# Patient Record
Sex: Female | Born: 1945 | Race: White | Hispanic: No | Smoking: Never smoker
Health system: Southern US, Community
[De-identification: ages and names within clinical notes are randomized; demographics above are authoritative.]

## PROBLEM LIST (undated history)

## (undated) DIAGNOSIS — E785 Hyperlipidemia, unspecified: Secondary | ICD-10-CM

## (undated) DIAGNOSIS — J449 Chronic obstructive pulmonary disease, unspecified: Secondary | ICD-10-CM

## (undated) DIAGNOSIS — L900 Lichen sclerosus et atrophicus: Secondary | ICD-10-CM

## (undated) DIAGNOSIS — Z87448 Personal history of other diseases of urinary system: Secondary | ICD-10-CM

## (undated) DIAGNOSIS — R7303 Prediabetes: Secondary | ICD-10-CM

## (undated) DIAGNOSIS — M810 Age-related osteoporosis without current pathological fracture: Secondary | ICD-10-CM

## (undated) DIAGNOSIS — H409 Unspecified glaucoma: Secondary | ICD-10-CM

## (undated) DIAGNOSIS — Z780 Asymptomatic menopausal state: Secondary | ICD-10-CM

## (undated) DIAGNOSIS — C4490 Unspecified malignant neoplasm of skin, unspecified: Secondary | ICD-10-CM

## (undated) DIAGNOSIS — Z87440 Personal history of urinary (tract) infections: Secondary | ICD-10-CM

## (undated) DIAGNOSIS — I10 Essential (primary) hypertension: Secondary | ICD-10-CM

## (undated) DIAGNOSIS — M8580 Other specified disorders of bone density and structure, unspecified site: Secondary | ICD-10-CM

## (undated) HISTORY — DX: Unspecified glaucoma: H40.9

## (undated) HISTORY — DX: Asymptomatic menopausal state: Z78.0

## (undated) HISTORY — DX: Essential (primary) hypertension: I10

## (undated) HISTORY — DX: Hyperlipidemia, unspecified: E78.5

## (undated) HISTORY — DX: Prediabetes: R73.03

## (undated) HISTORY — DX: Other specified disorders of bone density and structure, unspecified site: M85.80

## (undated) HISTORY — DX: Unspecified malignant neoplasm of skin, unspecified: C44.90

## (undated) HISTORY — DX: Age-related osteoporosis without current pathological fracture: M81.0

## (undated) HISTORY — PX: EYE SURGERY: SHX253

## (undated) HISTORY — DX: Personal history of urinary (tract) infections: Z87.440

## (undated) HISTORY — DX: Lichen sclerosus et atrophicus: L90.0

---
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
## 1999-01-01 ENCOUNTER — Encounter: Payer: Self-pay | Admitting: Emergency Medicine

## 1999-01-01 ENCOUNTER — Emergency Department (HOSPITAL_COMMUNITY): Admission: EM | Admit: 1999-01-01 | Discharge: 1999-01-01 | Payer: Self-pay | Admitting: Emergency Medicine

## 2001-10-07 ENCOUNTER — Encounter: Payer: Self-pay | Admitting: Internal Medicine

## 2001-10-07 ENCOUNTER — Encounter: Admission: RE | Admit: 2001-10-07 | Discharge: 2001-10-07 | Payer: Self-pay | Admitting: Internal Medicine

## 2001-10-30 ENCOUNTER — Encounter: Payer: Self-pay | Admitting: Internal Medicine

## 2001-10-30 ENCOUNTER — Encounter: Admission: RE | Admit: 2001-10-30 | Discharge: 2001-10-30 | Payer: Self-pay | Admitting: Internal Medicine

## 2002-06-19 ENCOUNTER — Other Ambulatory Visit: Admission: RE | Admit: 2002-06-19 | Discharge: 2002-06-19 | Payer: Self-pay | Admitting: Obstetrics and Gynecology

## 2002-07-06 ENCOUNTER — Encounter: Payer: Self-pay | Admitting: Emergency Medicine

## 2002-07-06 ENCOUNTER — Inpatient Hospital Stay (HOSPITAL_COMMUNITY): Admission: EM | Admit: 2002-07-06 | Discharge: 2002-07-08 | Payer: Self-pay | Admitting: Emergency Medicine

## 2002-12-11 ENCOUNTER — Encounter: Payer: Self-pay | Admitting: Internal Medicine

## 2002-12-11 ENCOUNTER — Encounter: Admission: RE | Admit: 2002-12-11 | Discharge: 2002-12-11 | Payer: Self-pay | Admitting: Internal Medicine

## 2003-12-25 ENCOUNTER — Other Ambulatory Visit: Admission: RE | Admit: 2003-12-25 | Discharge: 2003-12-25 | Payer: Self-pay | Admitting: Obstetrics and Gynecology

## 2004-01-07 ENCOUNTER — Encounter: Admission: RE | Admit: 2004-01-07 | Discharge: 2004-01-07 | Payer: Self-pay | Admitting: Internal Medicine

## 2004-08-04 IMAGING — CR DG CHEST 2V
2 series · 2 of 2 positions shown · non-contrast
Comparison: none

CLINICAL DATA: Cough for three weeks. 
 PA AND LATERAL CHEST:
 Portable study done [DATE] is correlated.  The cardiomediastinal contours are stable.  There is a probable small hiatal hernia.  The lungs are clear.  There is no pleural effusion.  Mild thoracic spine degenerative changes are present.

[view not recorded (1 of 2)]
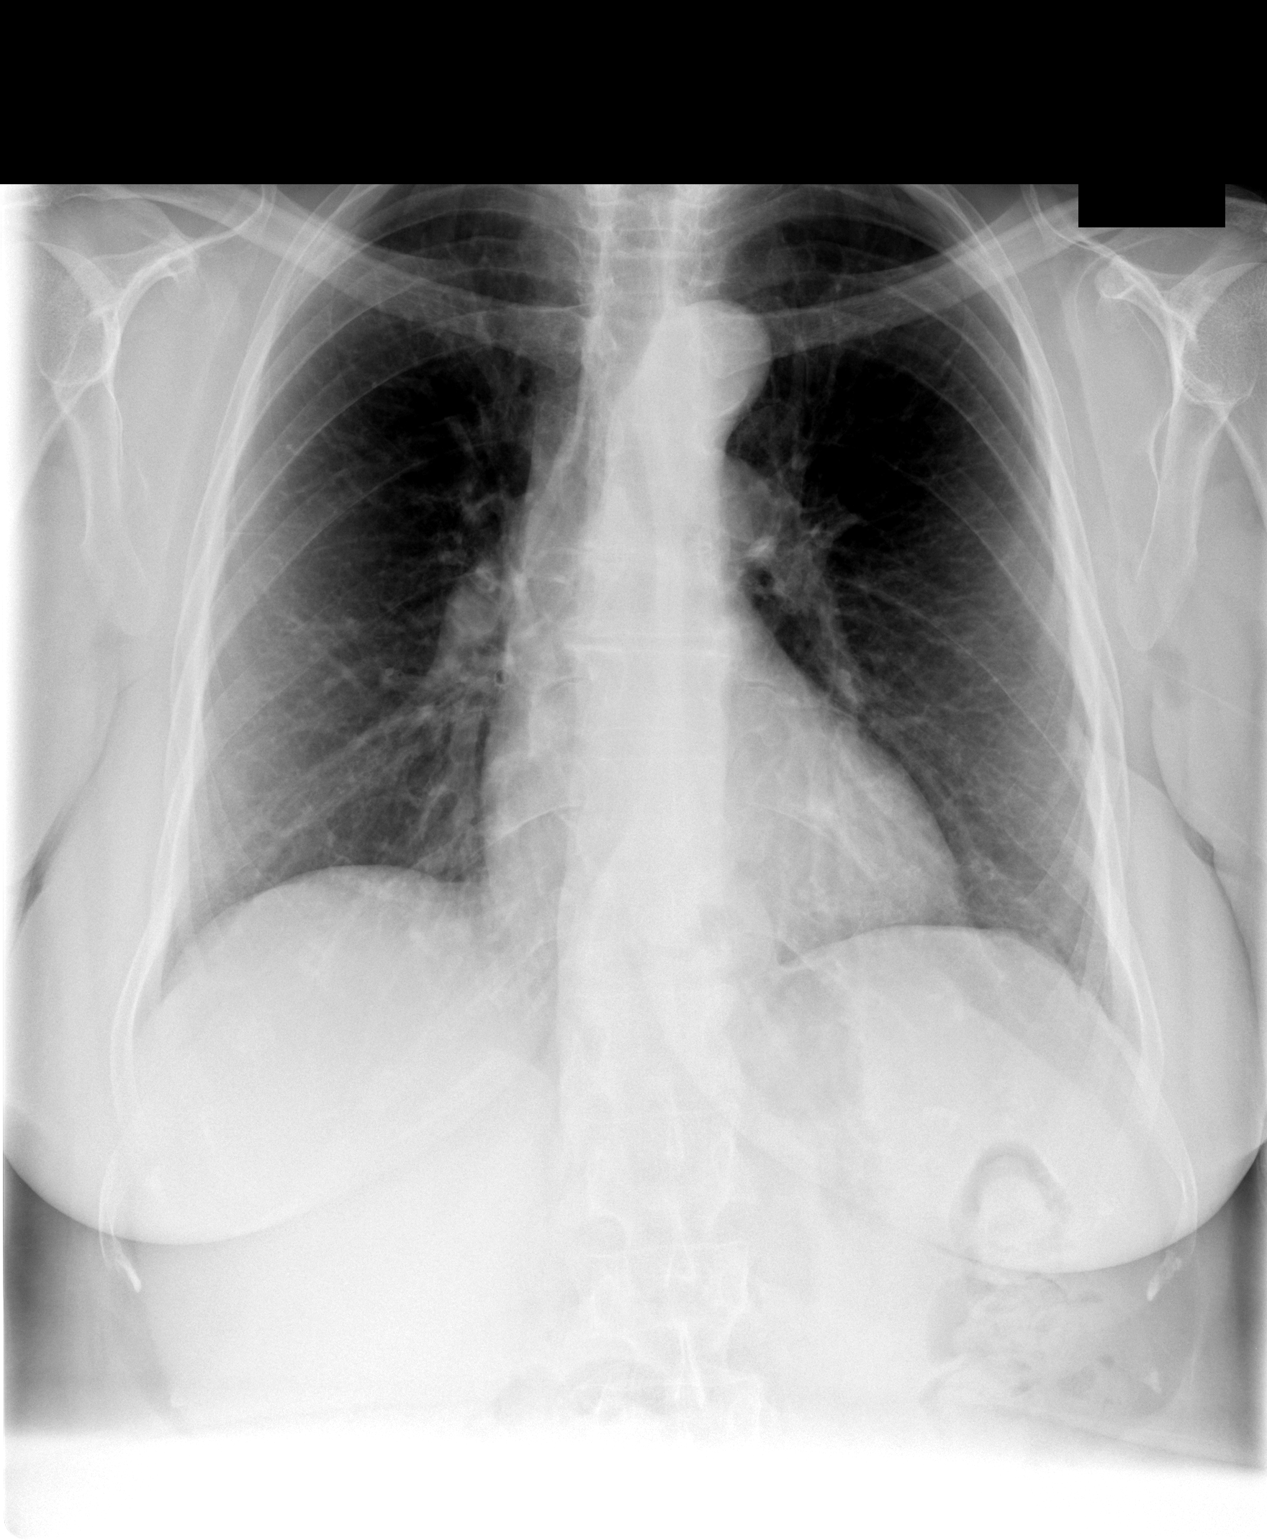

[view not recorded (2 of 2)]
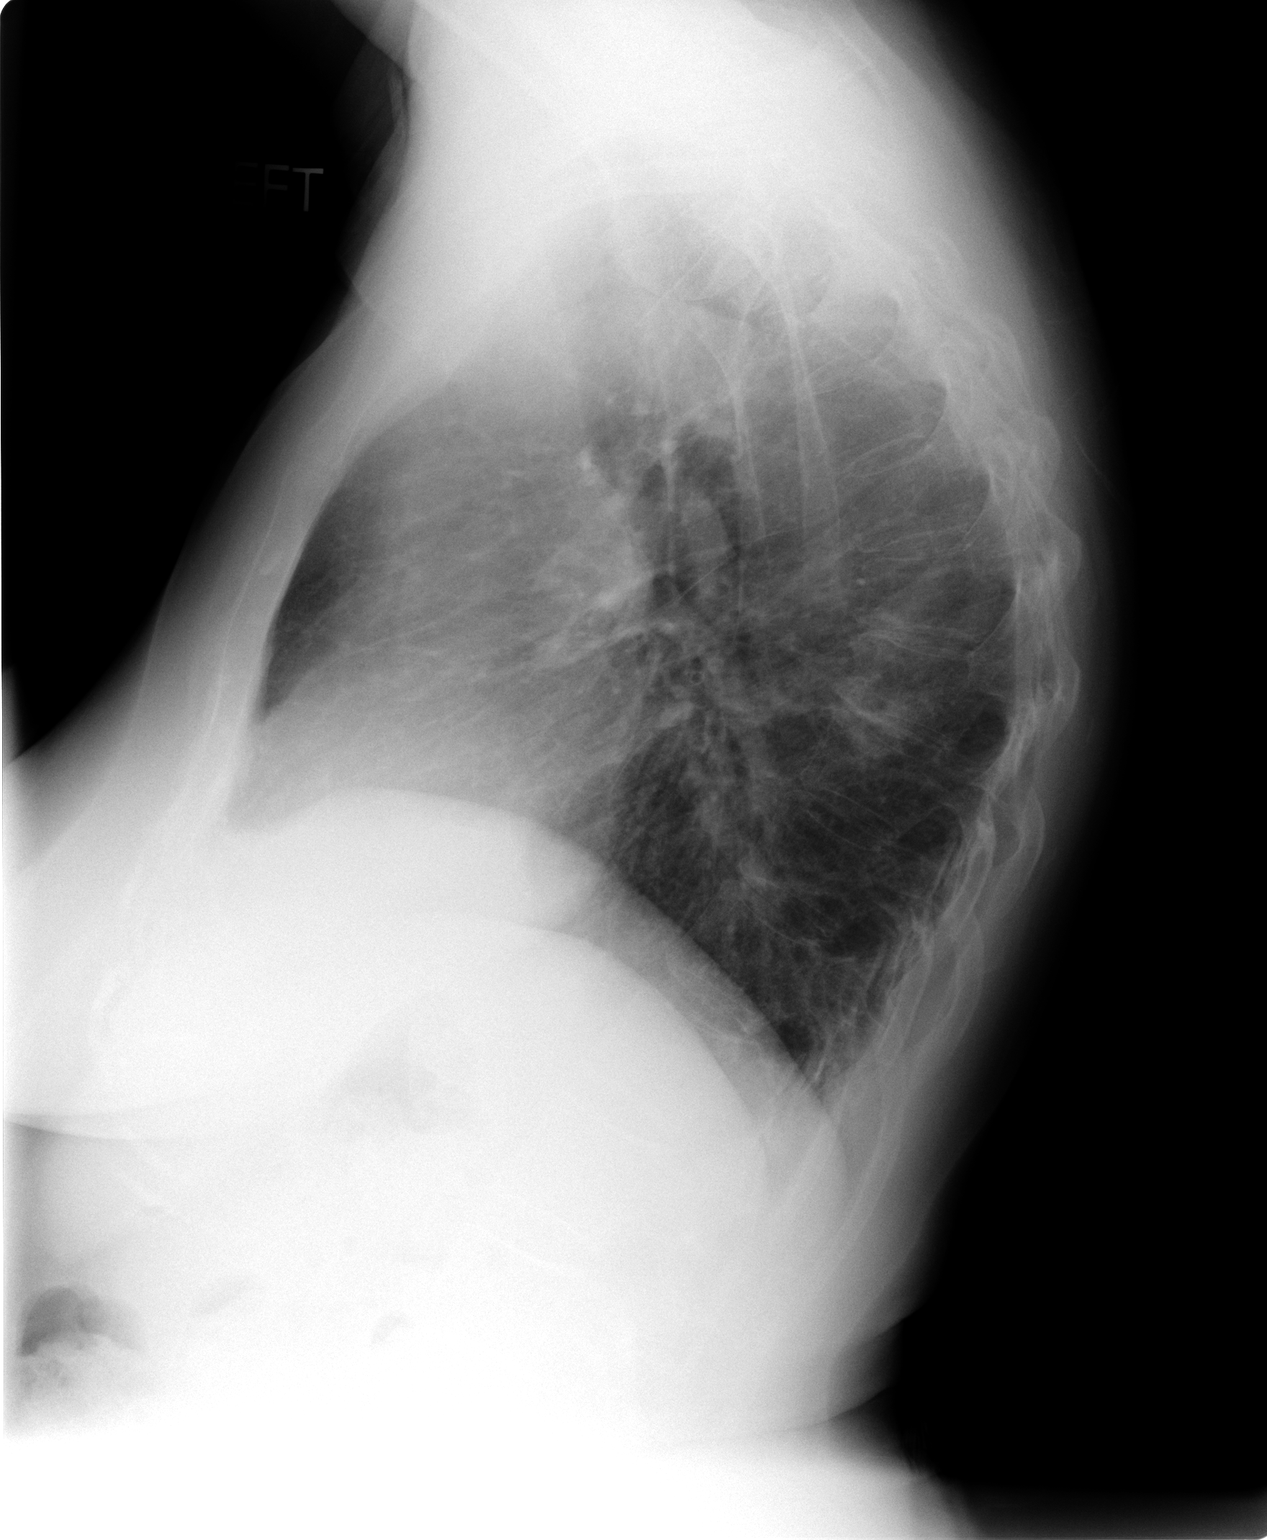

[2 of 2 positions shown; findings below may reference images not displayed]

IMPRESSION: Stable chest.  No active cardiopulmonary process demonstrated. 
 [REDACTED]

## 2004-12-07 ENCOUNTER — Encounter: Admission: RE | Admit: 2004-12-07 | Discharge: 2004-12-07 | Payer: Self-pay | Admitting: Internal Medicine

## 2005-01-17 ENCOUNTER — Encounter: Admission: RE | Admit: 2005-01-17 | Discharge: 2005-01-17 | Payer: Self-pay | Admitting: Internal Medicine

## 2005-01-25 ENCOUNTER — Encounter: Admission: RE | Admit: 2005-01-25 | Discharge: 2005-01-25 | Payer: Self-pay | Admitting: Internal Medicine

## 2005-06-05 ENCOUNTER — Other Ambulatory Visit: Admission: RE | Admit: 2005-06-05 | Discharge: 2005-06-05 | Payer: Self-pay | Admitting: Obstetrics and Gynecology

## 2005-07-05 ENCOUNTER — Encounter: Admission: RE | Admit: 2005-07-05 | Discharge: 2005-07-05 | Payer: Self-pay | Admitting: Internal Medicine

## 2005-07-05 IMAGING — CR DG HIP (WITH OR WITHOUT PELVIS) 2-3V*L*
2 series · 2 of 2 positions shown · non-contrast
Comparison: none

CLINICAL DATA: Pain in the left groin region for approximately three weeks.  No injury. 
 PELVIS ONE VIEW:
 An AP view of the pelvis without previous films for comparison shows a moderate degree of sclerosis associated with the symphysis region bilaterally.  There is no bony destruction or fracture.  The lower lumbar spine shows considerable degenerative hypertrophic facet arthritis at the L5-S1 level bilaterally which could be causing some referred pain into the region of the left groin.

[view not recorded (1 of 2)]
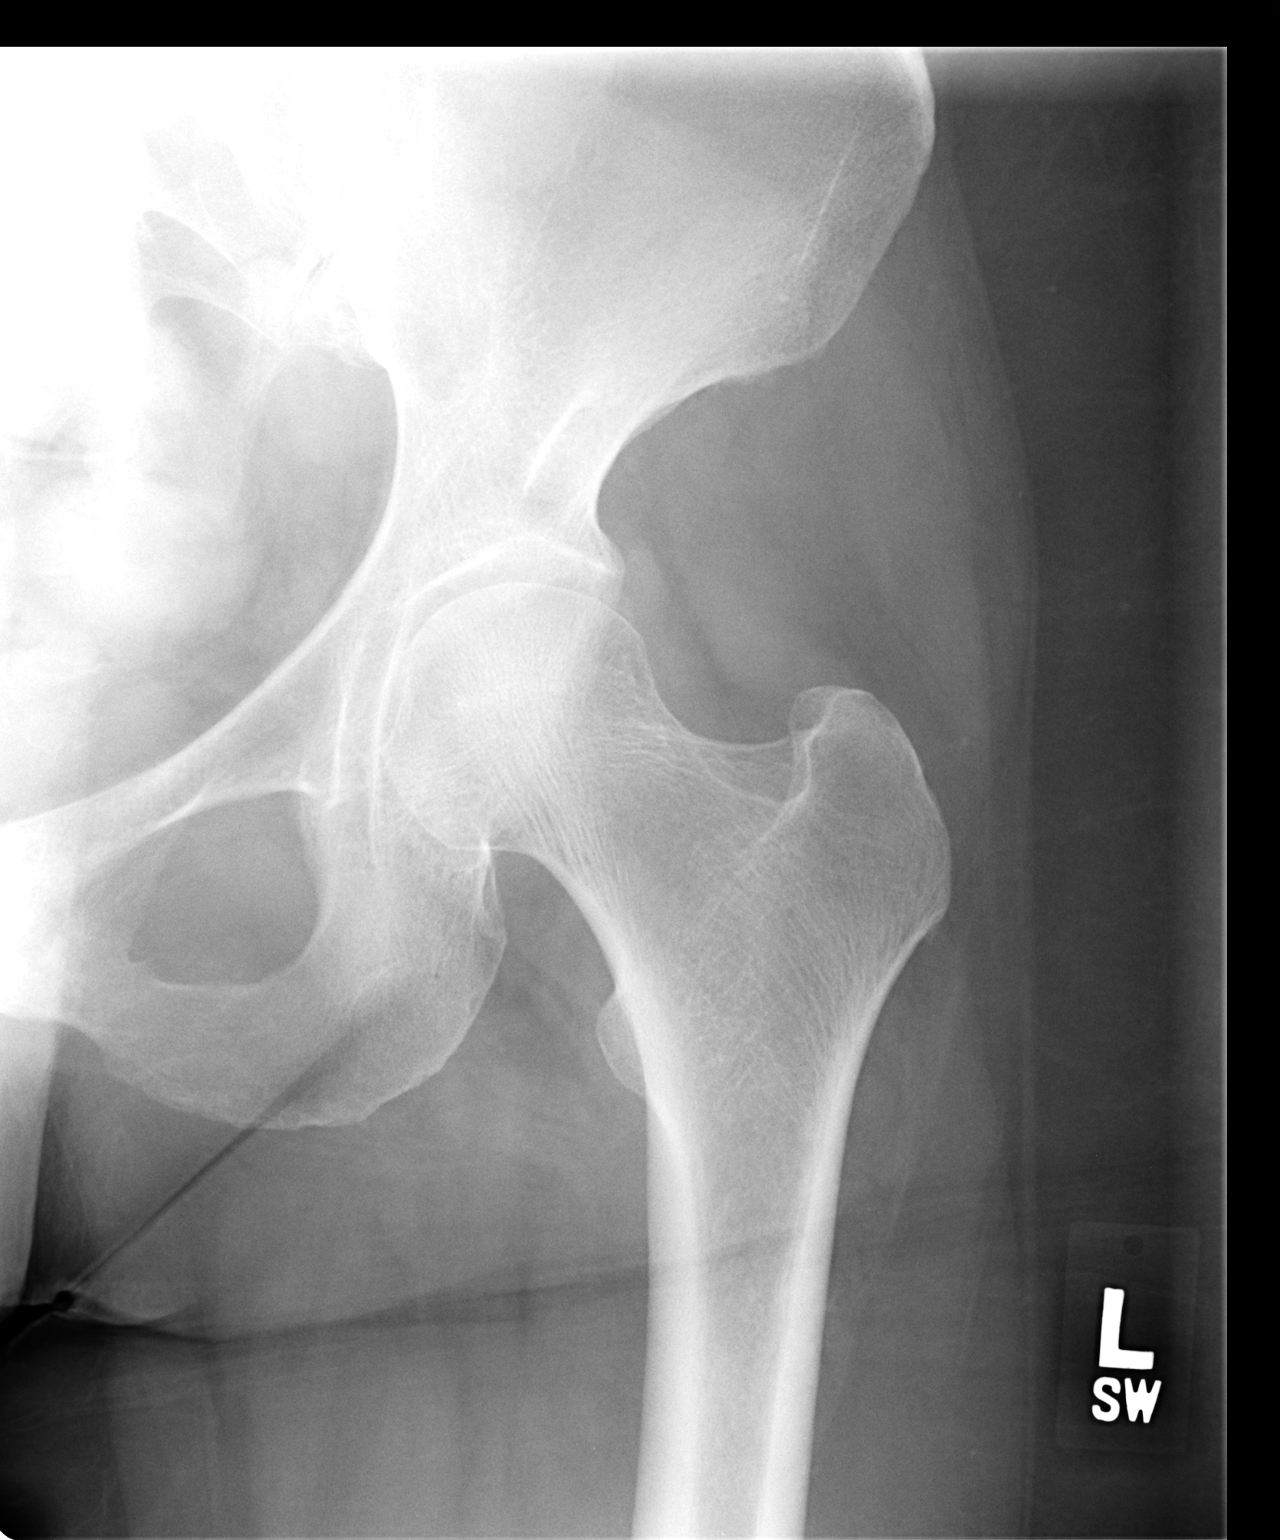

[view not recorded (2 of 2)]
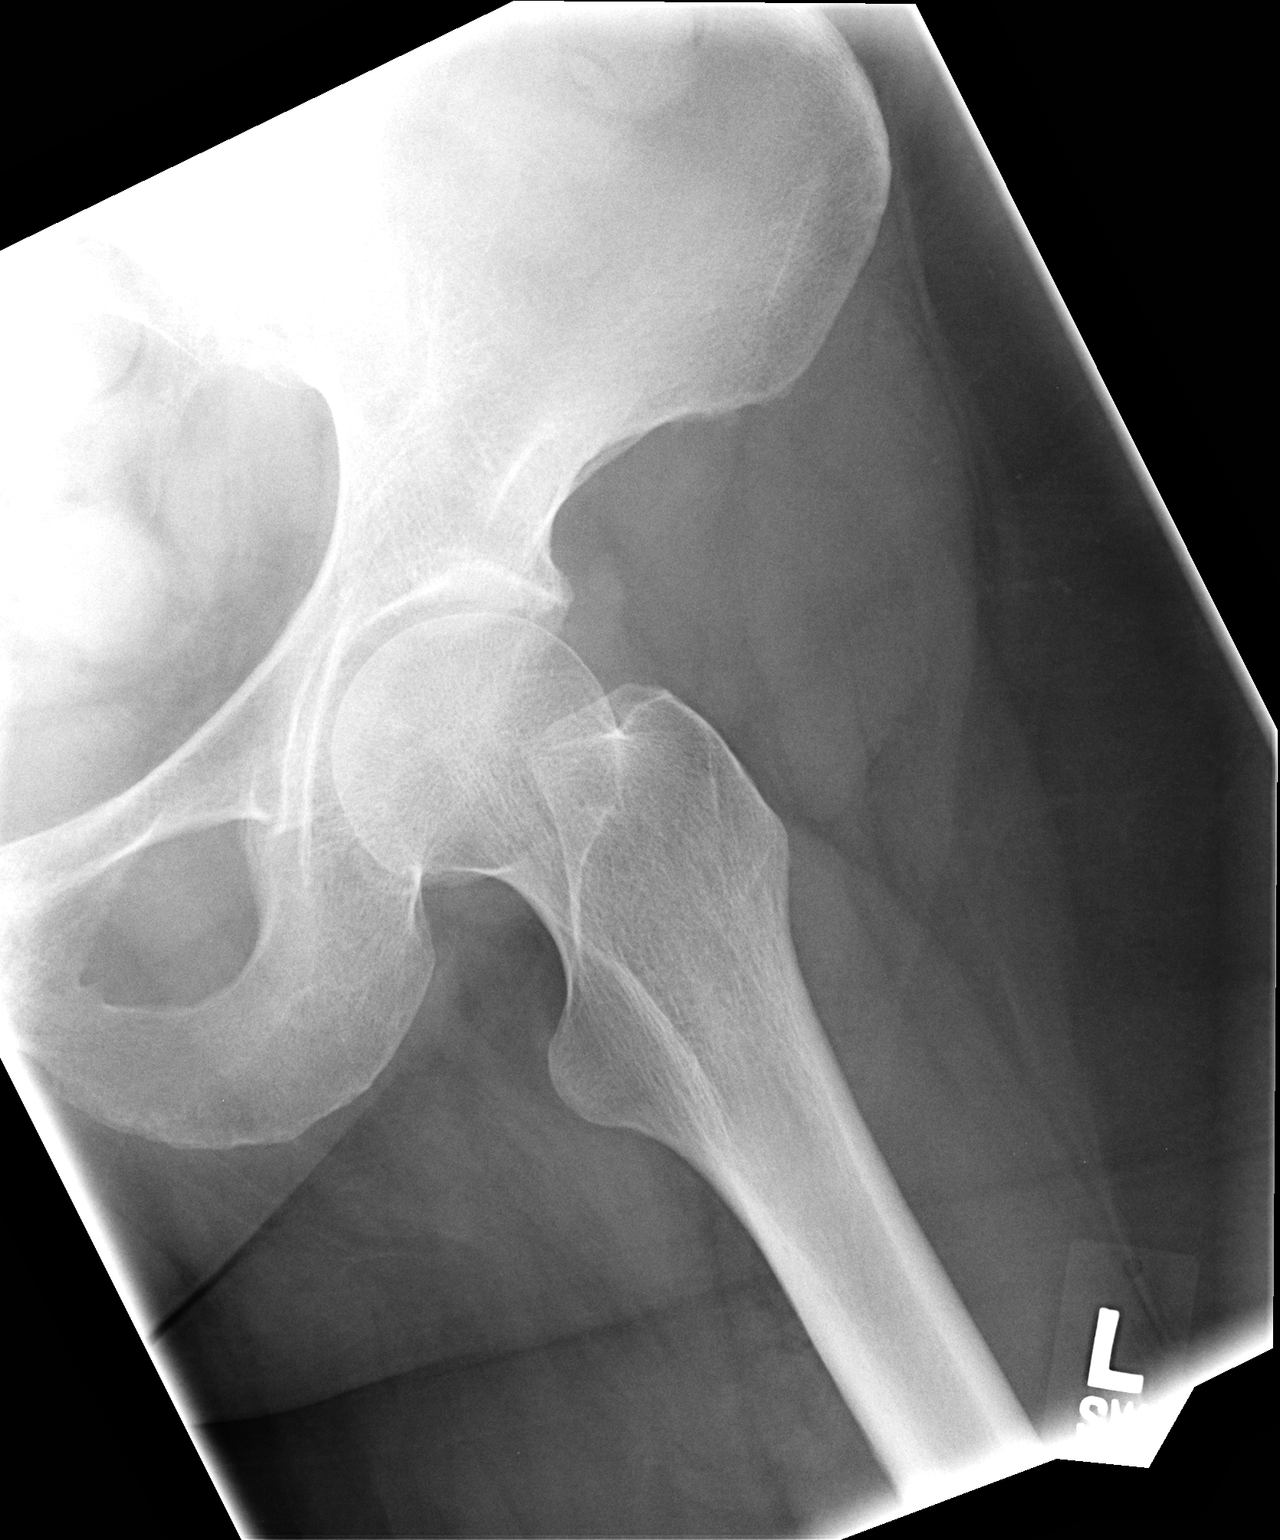

[2 of 2 positions shown; findings below may reference images not displayed]

IMPRESSION: No fracture or foreign body associated with the pelvis.  There are degenerative hypertrophic spurs of the L5-S1 facet joints bilaterally and there is a moderate amount of sclerosis associated with both the right and the left sides of the symphysis. 
 LEFT HIP COMPLETE:
 AP and lateral views of the left hip show no evidence of fracture, dislocation, or radiopaque foreign body.  Soft tissues appear normal.
IMPRESSION: Normal left hip.

## 2005-07-05 IMAGING — CR DG PELVIS 1-2V
1 series · 1 of 1 positions shown · non-contrast
Comparison: none

CLINICAL DATA: Pain in the left groin region for approximately three weeks.  No injury. 
 PELVIS ONE VIEW:
 An AP view of the pelvis without previous films for comparison shows a moderate degree of sclerosis associated with the symphysis region bilaterally.  There is no bony destruction or fracture.  The lower lumbar spine shows considerable degenerative hypertrophic facet arthritis at the L5-S1 level bilaterally which could be causing some referred pain into the region of the left groin.

[view not recorded]
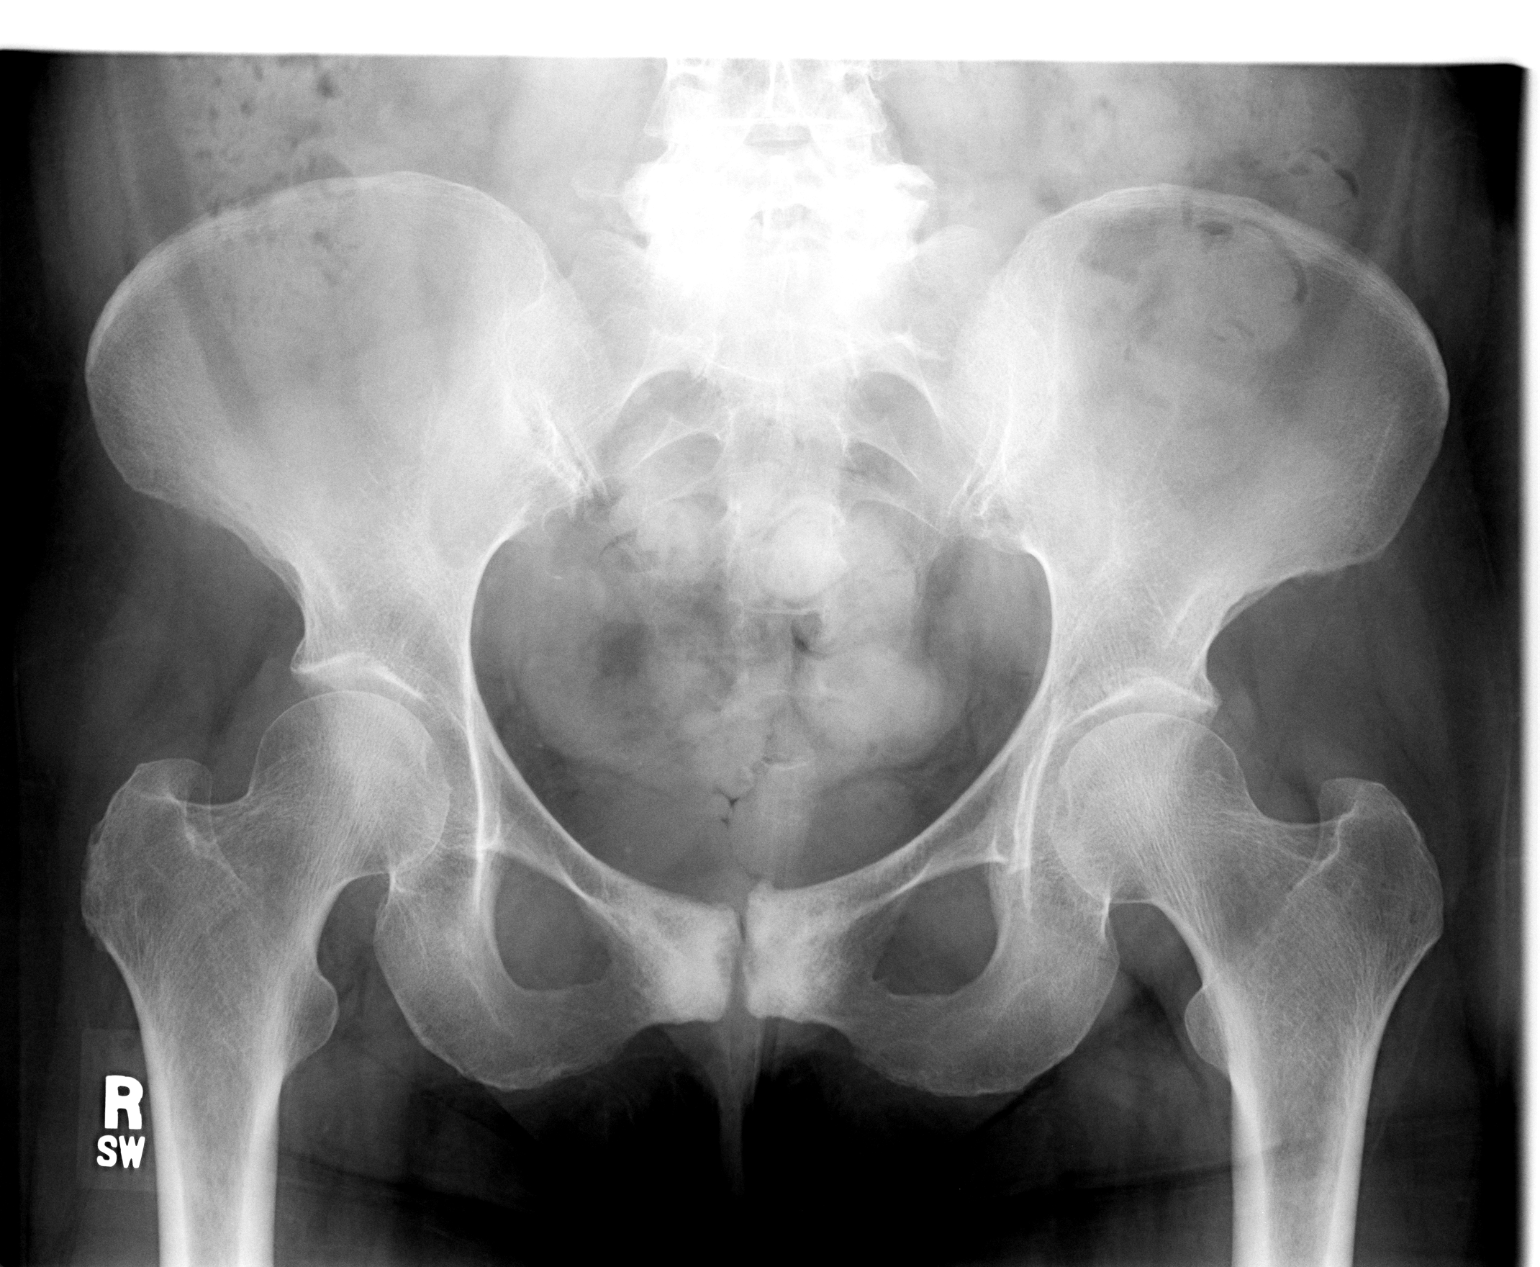

[1 of 1 positions shown; findings below may reference images not displayed]

IMPRESSION: No fracture or foreign body associated with the pelvis.  There are degenerative hypertrophic spurs of the L5-S1 facet joints bilaterally and there is a moderate amount of sclerosis associated with both the right and the left sides of the symphysis. 
 LEFT HIP COMPLETE:
 AP and lateral views of the left hip show no evidence of fracture, dislocation, or radiopaque foreign body.  Soft tissues appear normal.
IMPRESSION: Normal left hip.

## 2005-08-14 HISTORY — PX: HEMATOMA EVACUATION: SHX5118

## 2005-09-06 ENCOUNTER — Encounter: Admission: RE | Admit: 2005-09-06 | Discharge: 2005-09-06 | Payer: Self-pay | Admitting: Internal Medicine

## 2005-10-18 ENCOUNTER — Encounter: Admission: RE | Admit: 2005-10-18 | Discharge: 2005-10-18 | Payer: Self-pay | Admitting: Internal Medicine

## 2005-10-18 IMAGING — US US EXTREM LOW VENOUS*L*
1 series · 14 of 21 positions shown · non-contrast
Comparison: none

CLINICAL DATA: Left calf pain.  Evaluate for DVT. 
 ULTRASOUND VENOUS IMAGING LEFT LEG:
 Ultrasound of the deep venous system of the left leg was performed.  The left saphenous ? femoral junction, left common femoral vein, left profunda femoral vein, left superficial femoral vein, and left popliteal vein compress and augment normally.  No deep venous thrombosis is seen.  No superficial phlebitis is seen.

[Series 1: unknown · 14 of 21 slices shown]
[im 1/21]
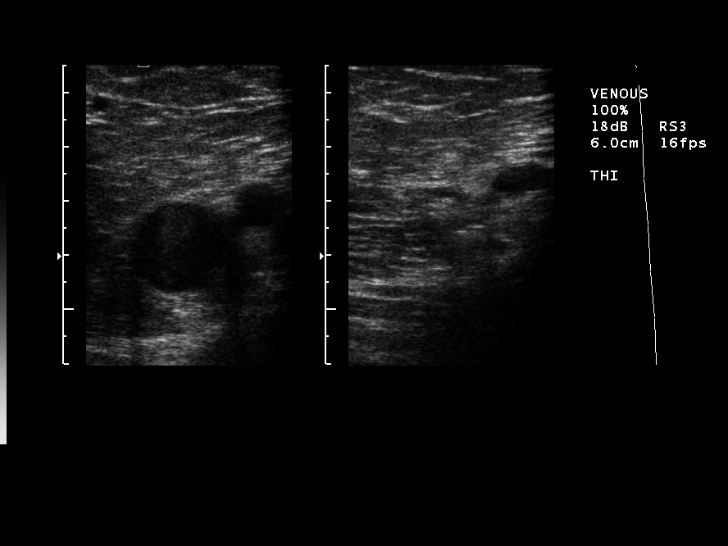
[im 3/21]
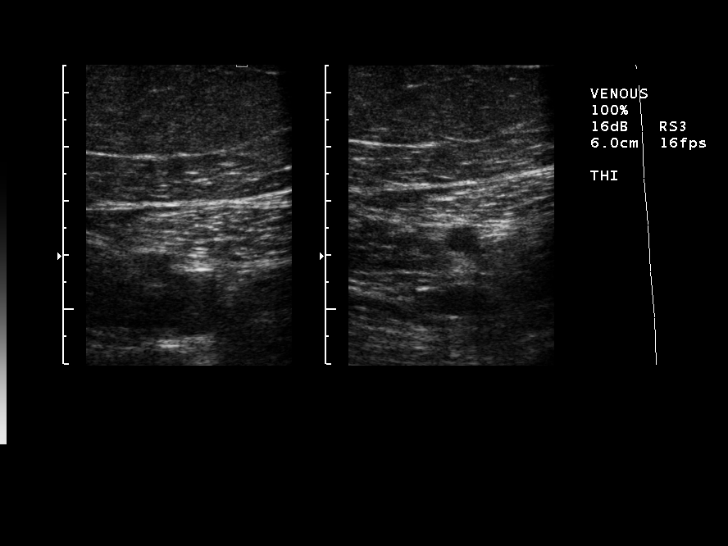
[im 4/21]
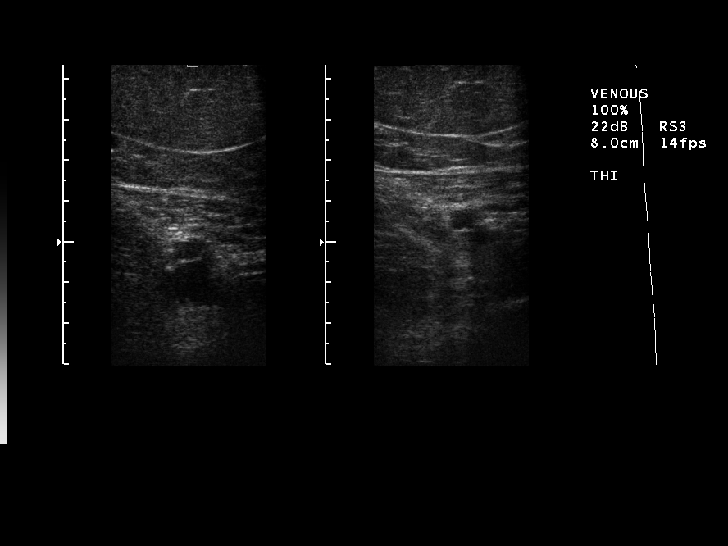
[im 6/21]
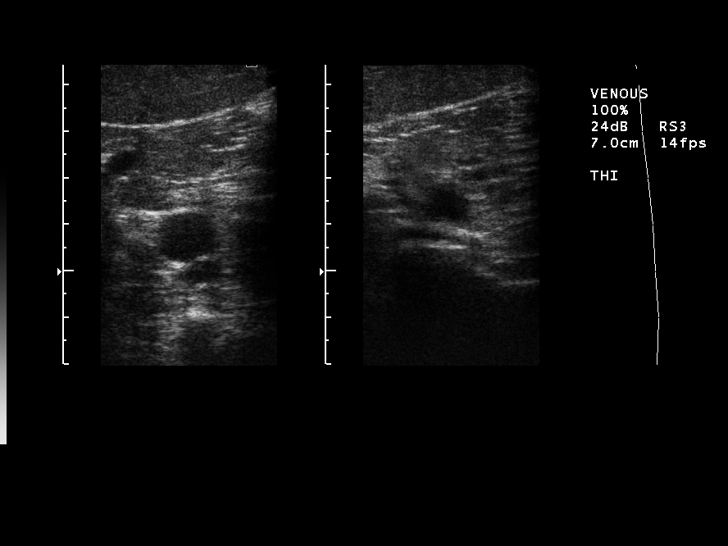
[im 7/21]
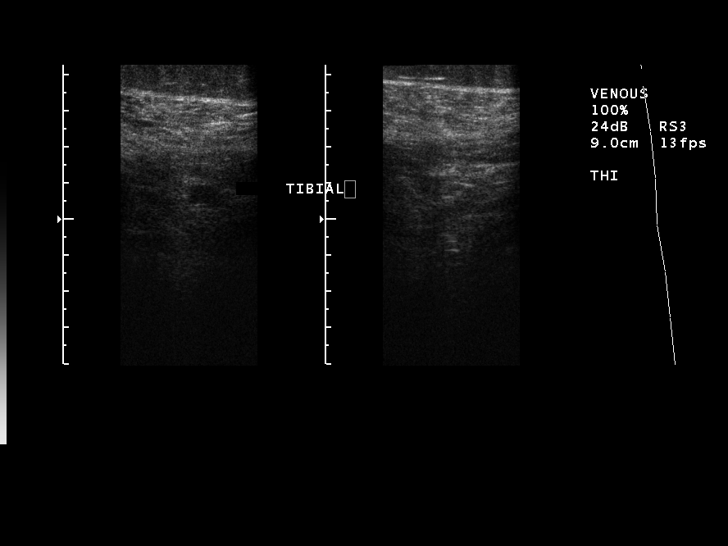
[im 9/21]
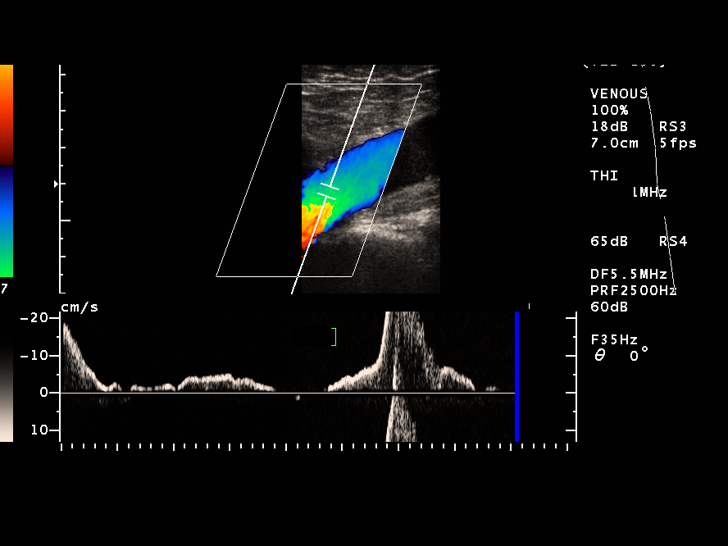
[im 10/21]
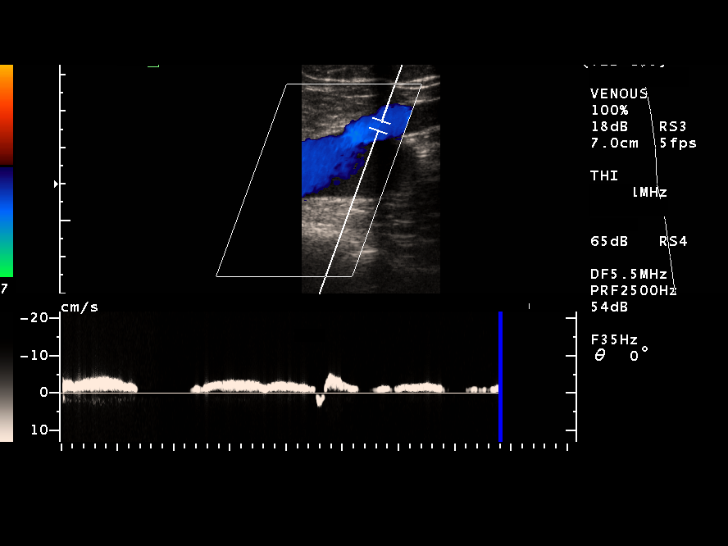
[im 12/21]
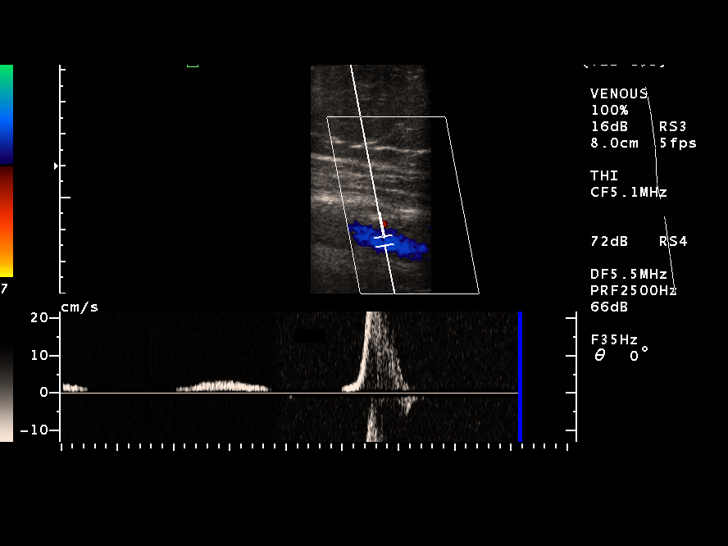
[im 13/21]
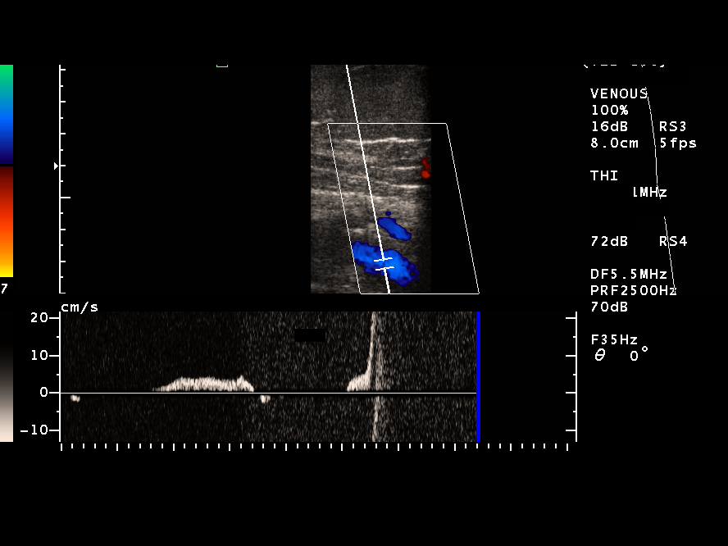
[im 15/21]
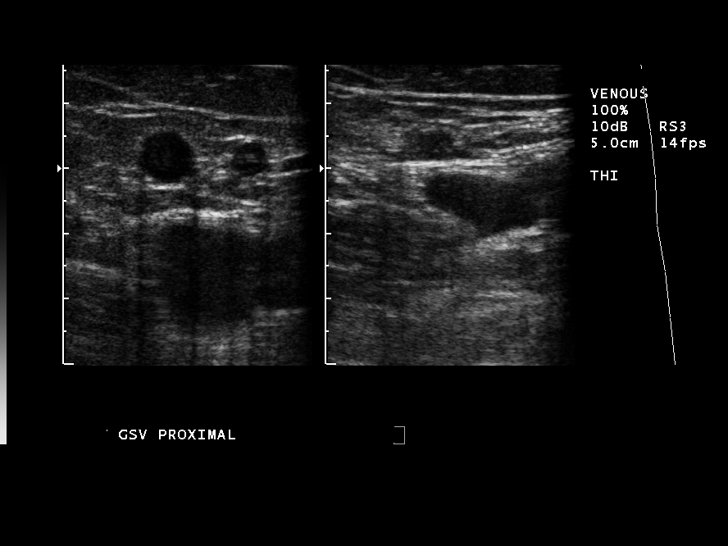
[im 16/21]
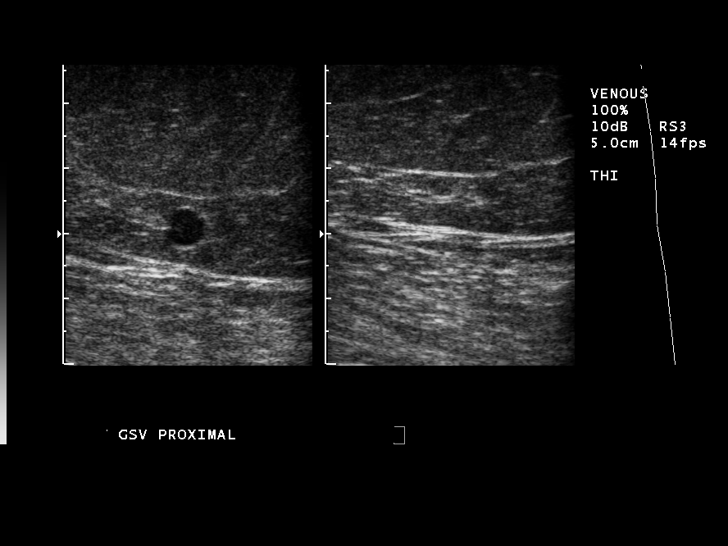
[im 18/21]
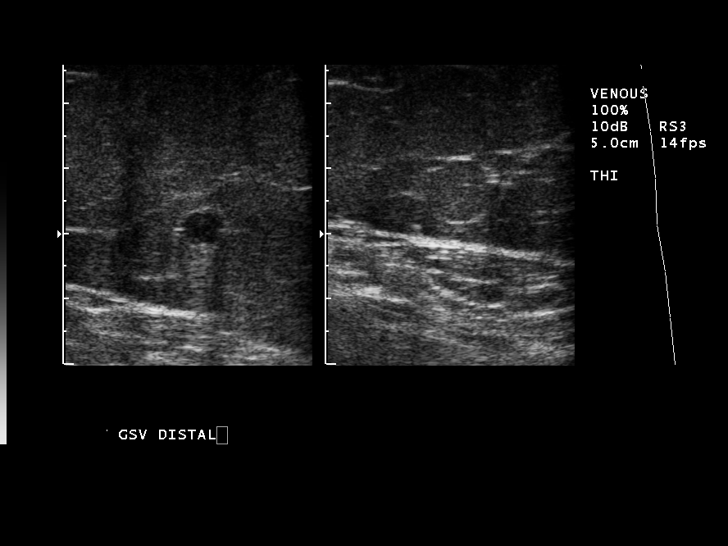
[im 19/21]
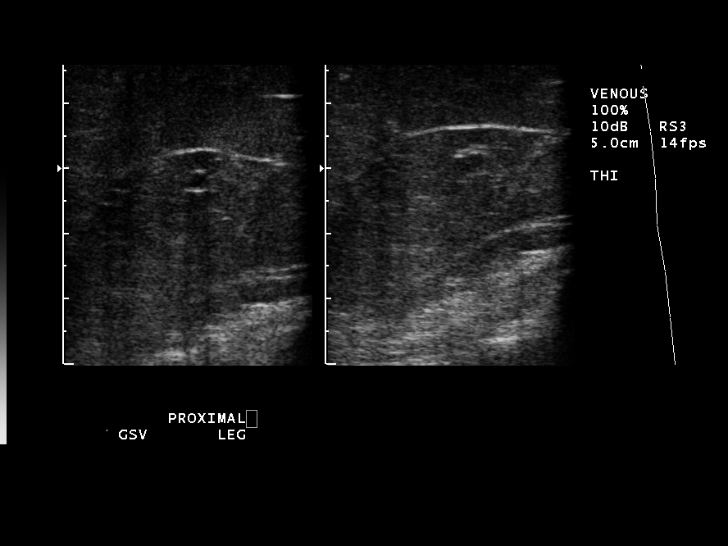
[im 21/21]
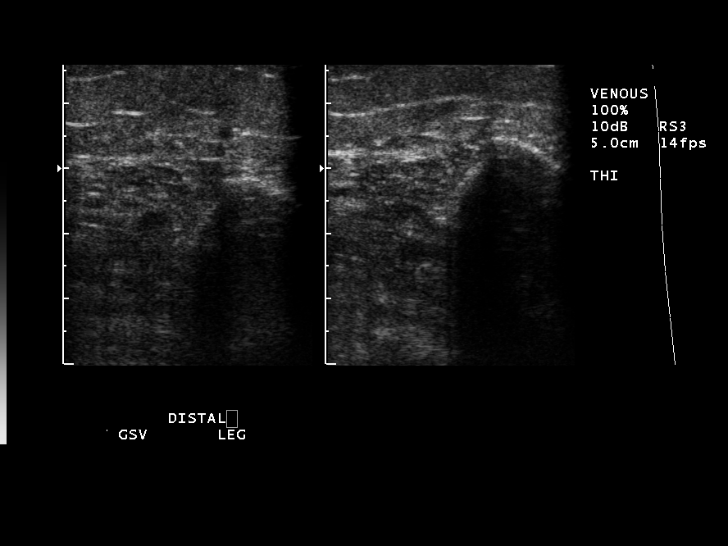

[14 of 21 positions shown; findings below may reference images not displayed]

IMPRESSION: Negative ultrasound of the left leg for DVT.

## 2006-01-24 ENCOUNTER — Encounter: Admission: RE | Admit: 2006-01-24 | Discharge: 2006-01-24 | Payer: Self-pay | Admitting: Internal Medicine

## 2006-01-24 IMAGING — CR DG TIBIA/FIBULA 2V*L*
2 series · 2 of 2 positions shown · non-contrast
Comparison: none

CLINICAL DATA: No injury. Swelling and pain in region of the calf and left knee.
 LEFT TIBIA AND FIBULA:
 AP and lateral views of the left tibia and fibula show soft tissue swelling in the region of the calf.  No fracture, dislocation, or radiopaque foreign body is seen.

[view not recorded (1 of 2)]
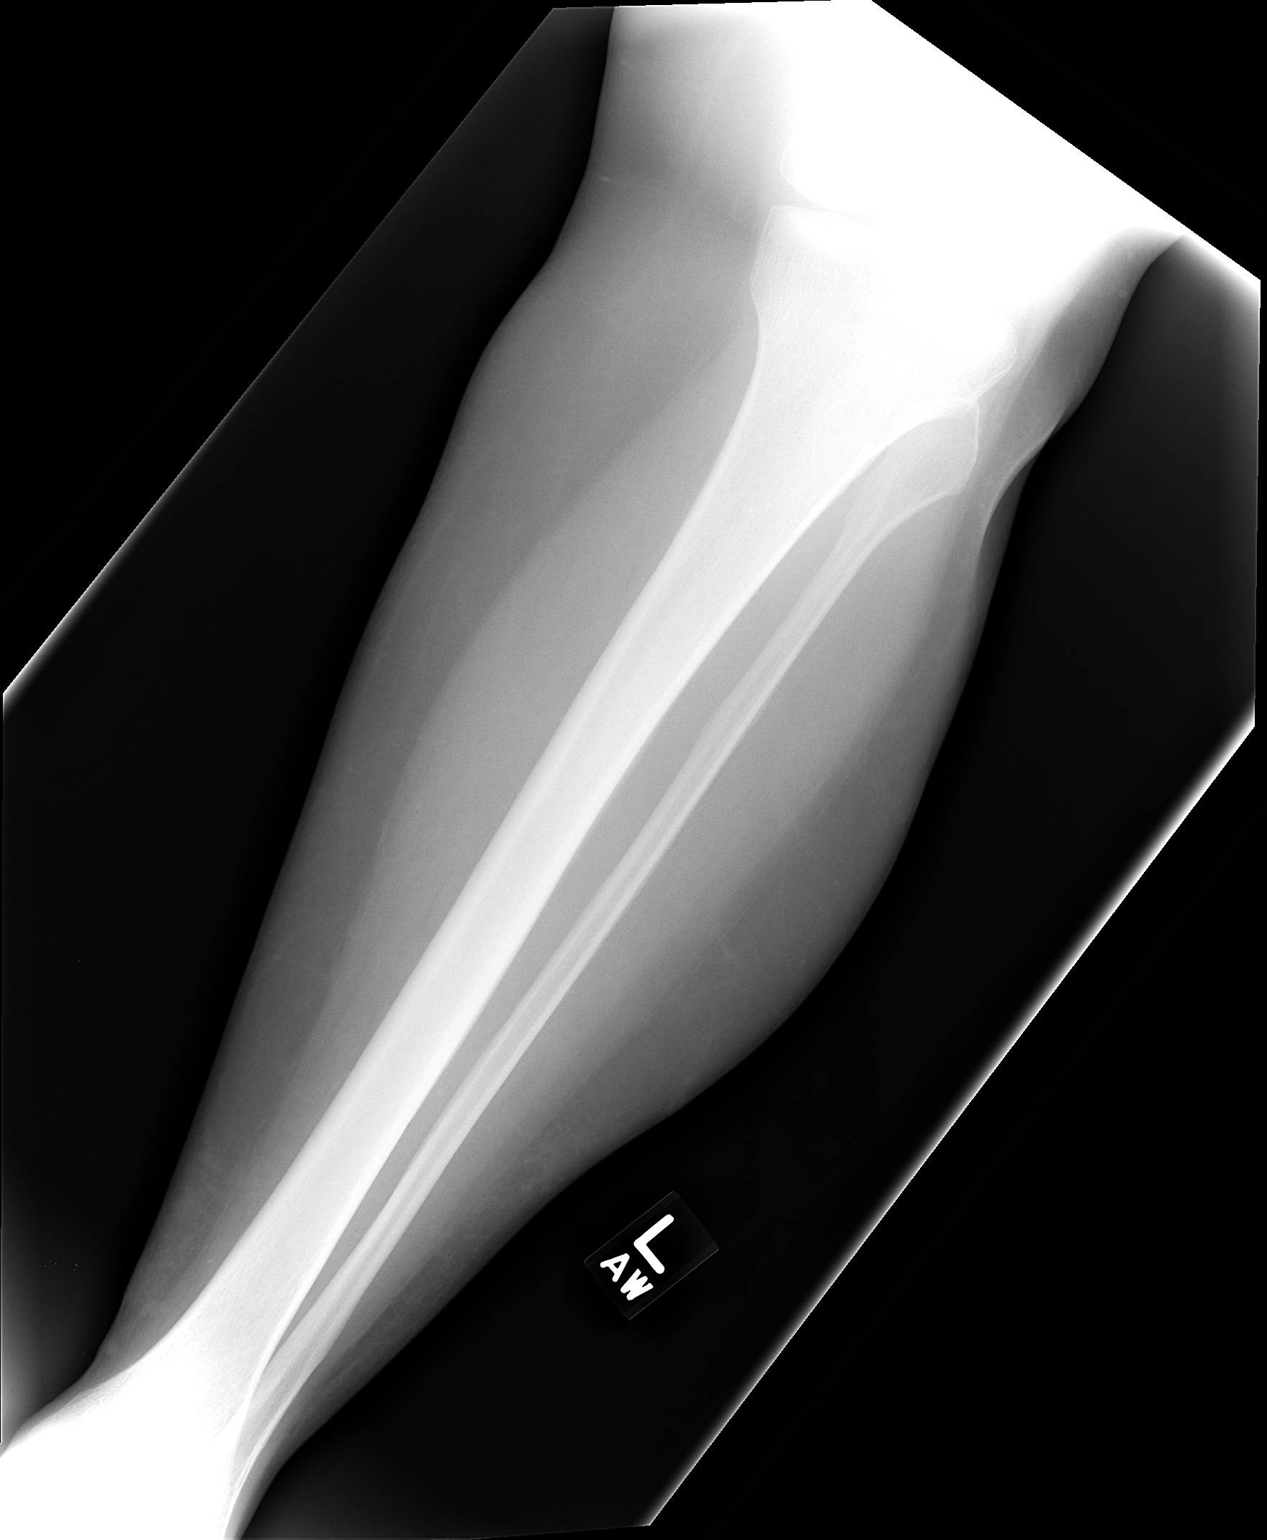

[view not recorded (2 of 2)]
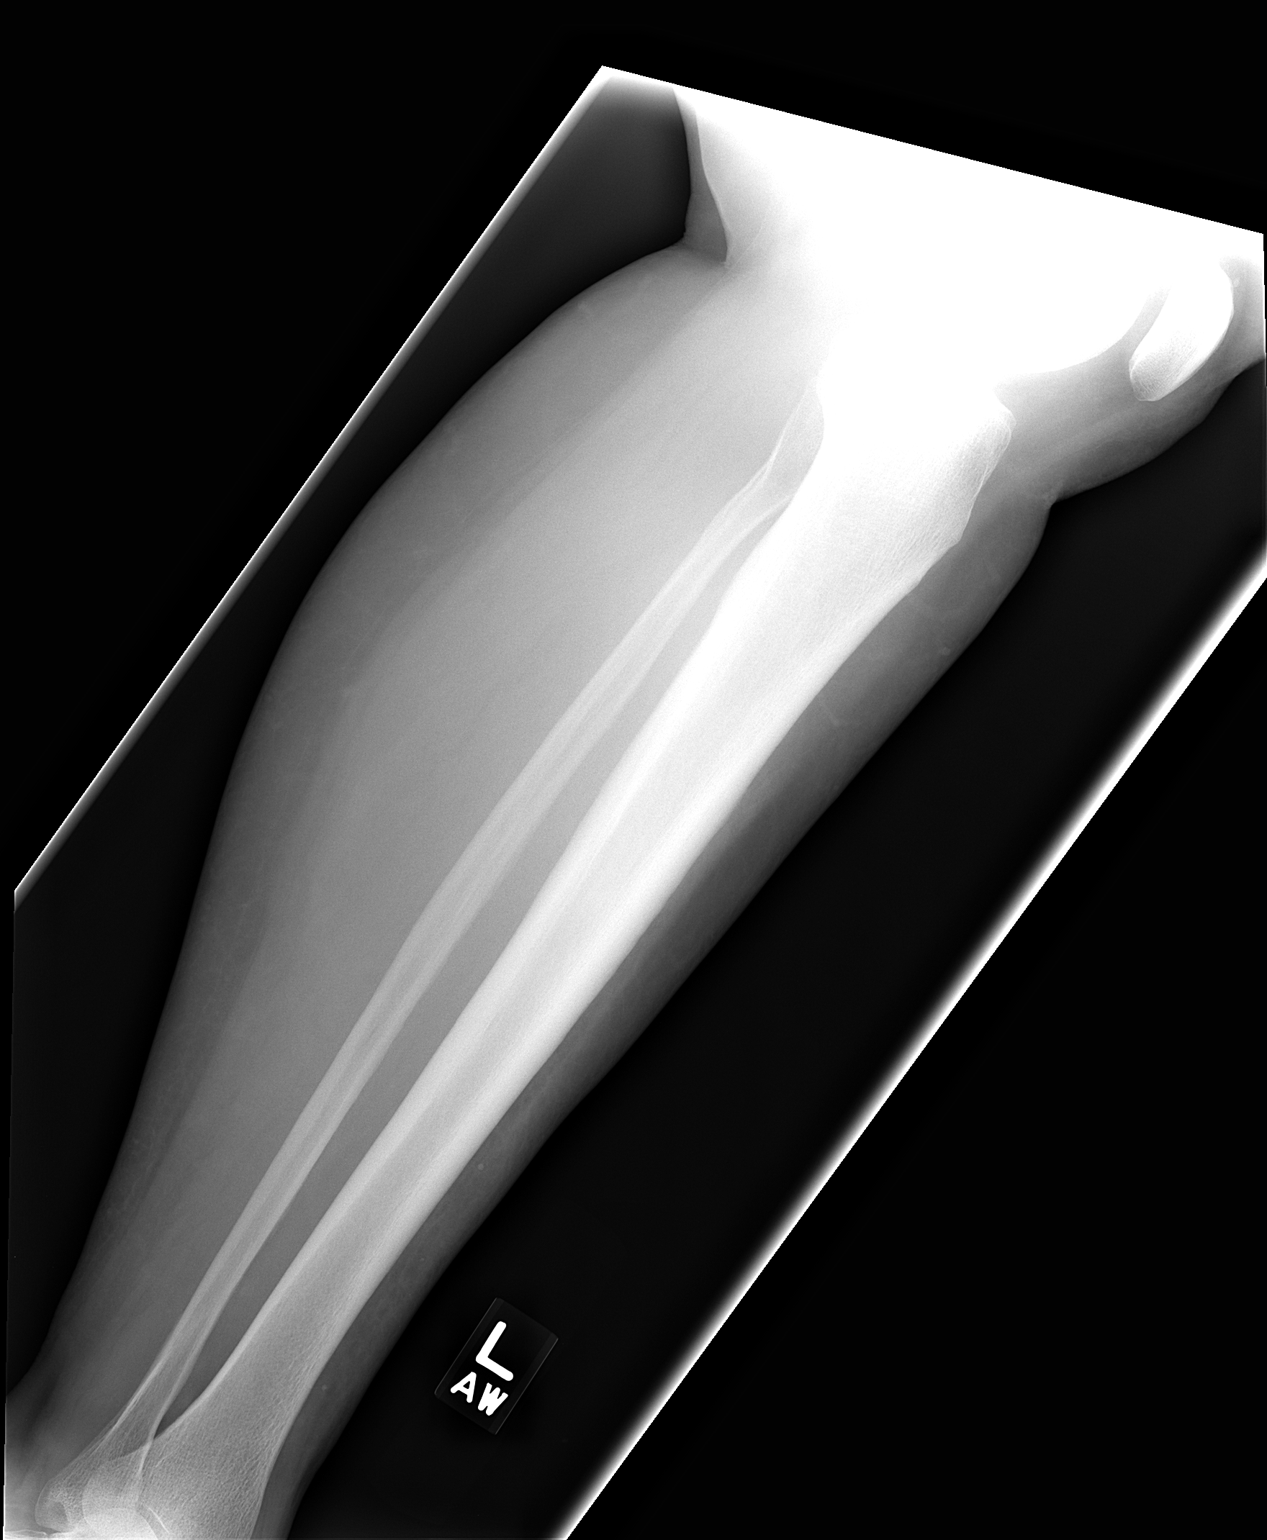

[2 of 2 positions shown; findings below may reference images not displayed]

IMPRESSION: Normal tibia and fibula.  Soft tissue swelling in calf region. 
 LEFT KNEE ? TWO VIEWS:
 AP and lateral views of the left knee show soft tissue swelling but no fracture, dislocation, or foreign body.  There is no definite joint effusion.  The knee joint appears to be intact.
IMPRESSION: Soft tissue swelling over the region of the knee with no joint effusion, fracture, dislocation, or foreign body.

## 2006-01-24 IMAGING — CR DG KNEE 1-2V*L*
2 series · 2 of 2 positions shown · non-contrast
Comparison: none

CLINICAL DATA: No injury. Swelling and pain in region of the calf and left knee.
 LEFT TIBIA AND FIBULA:
 AP and lateral views of the left tibia and fibula show soft tissue swelling in the region of the calf.  No fracture, dislocation, or radiopaque foreign body is seen.

[view not recorded (1 of 2)]
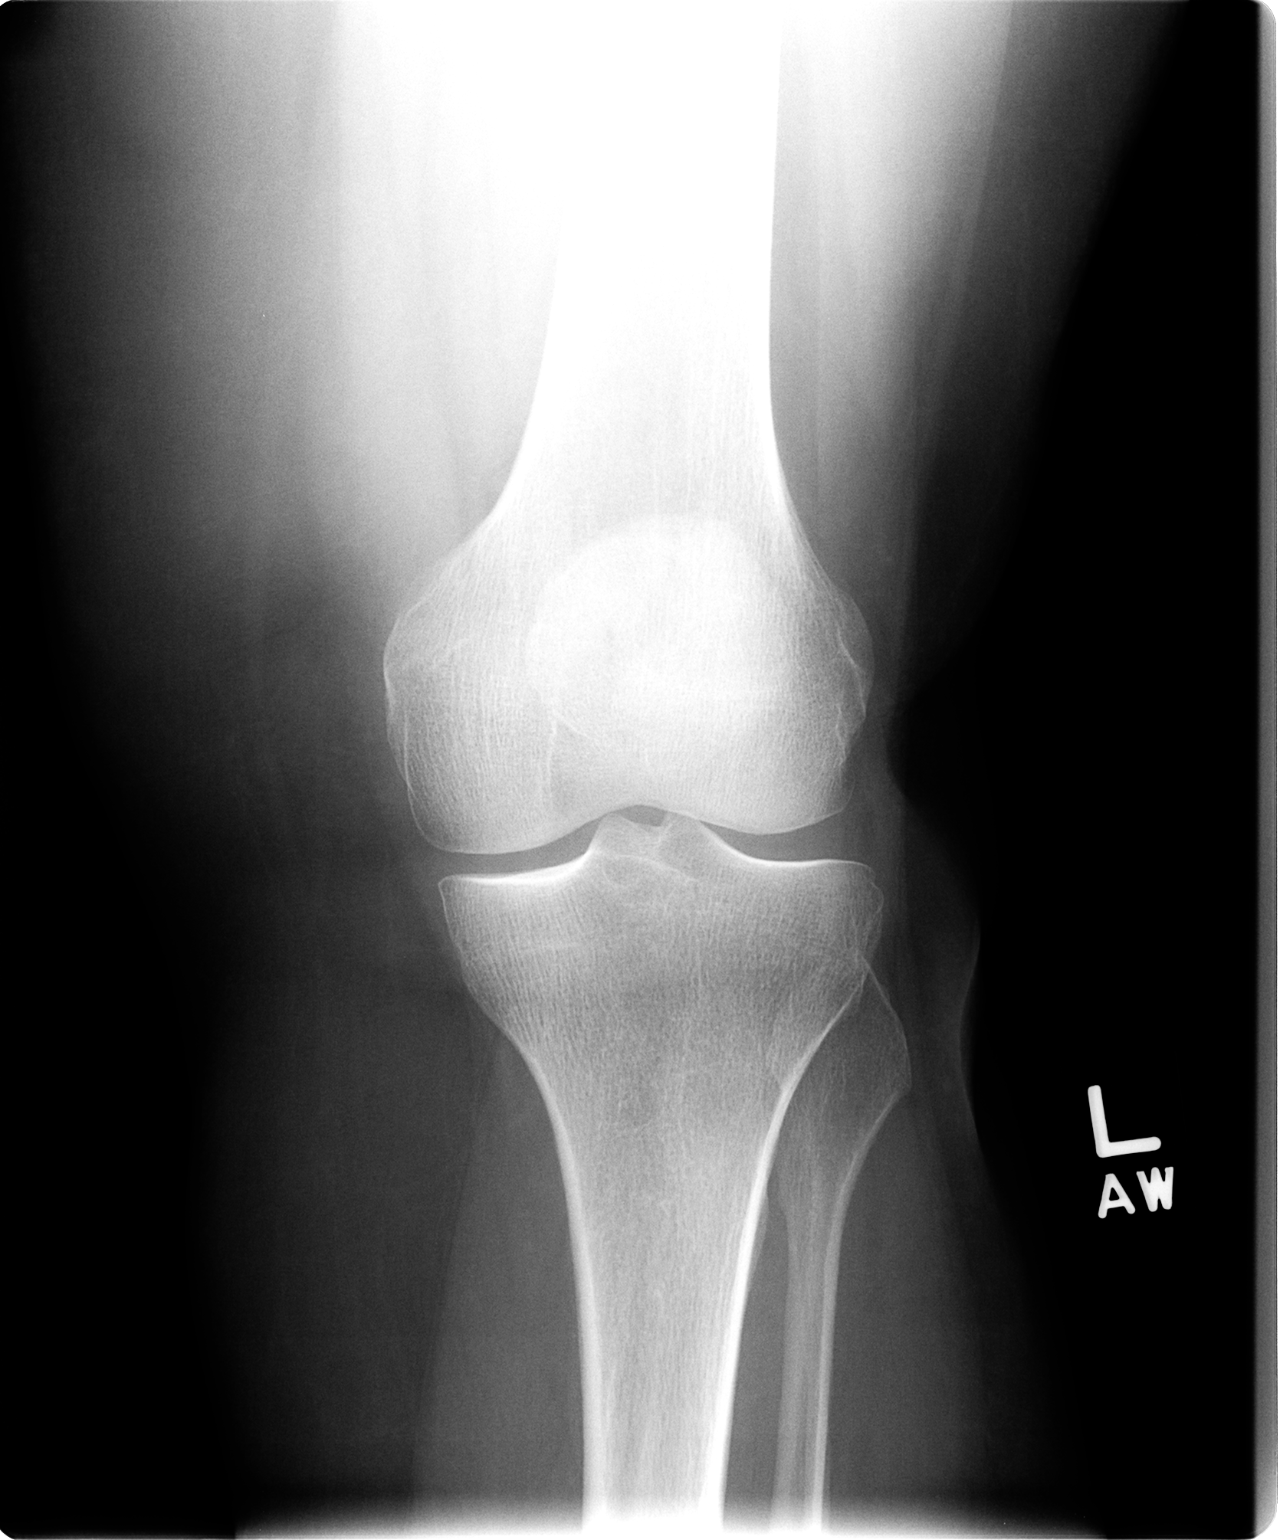

[view not recorded (2 of 2)]
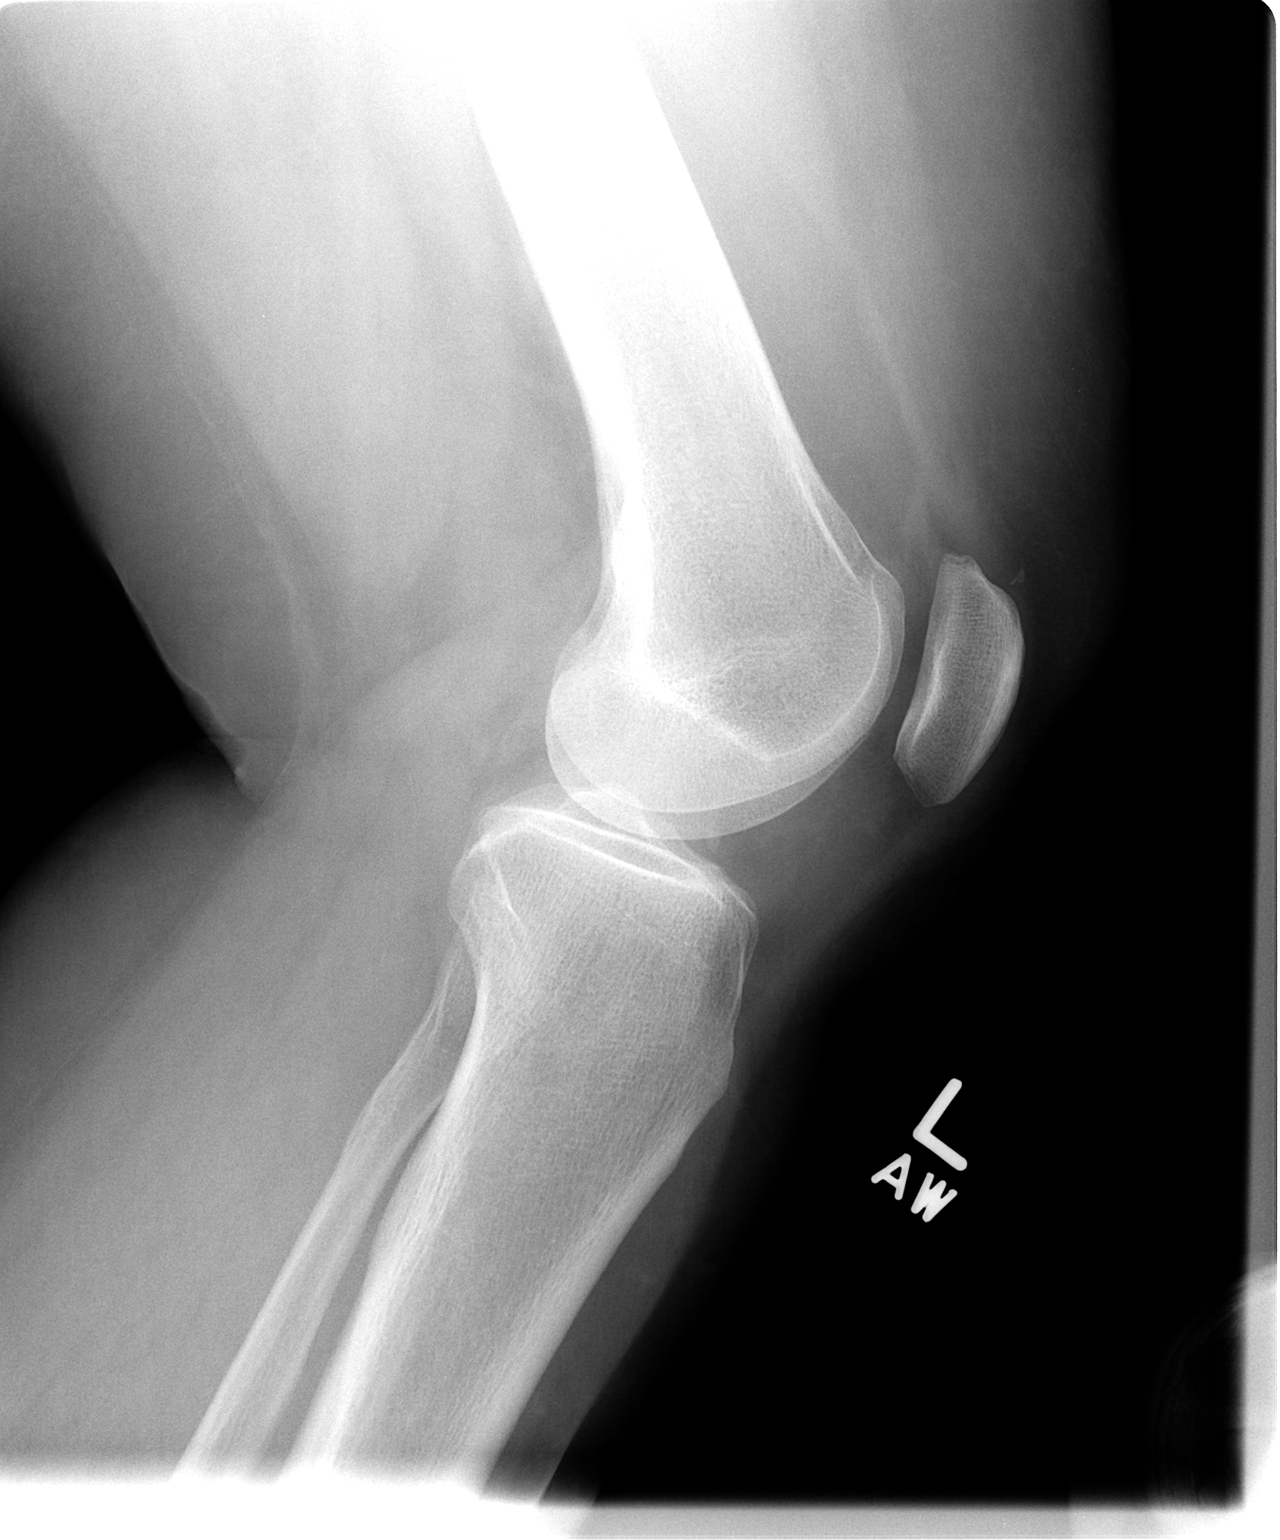

[2 of 2 positions shown; findings below may reference images not displayed]

IMPRESSION: Normal tibia and fibula.  Soft tissue swelling in calf region. 
 LEFT KNEE ? TWO VIEWS:
 AP and lateral views of the left knee show soft tissue swelling but no fracture, dislocation, or foreign body.  There is no definite joint effusion.  The knee joint appears to be intact.
IMPRESSION: Soft tissue swelling over the region of the knee with no joint effusion, fracture, dislocation, or foreign body.

## 2006-03-07 ENCOUNTER — Encounter: Admission: RE | Admit: 2006-03-07 | Discharge: 2006-03-07 | Payer: Self-pay | Admitting: Internal Medicine

## 2006-03-07 IMAGING — MG MM SCREEN MAMMOGRAM BILATERAL
4 series · 4 of 4 positions shown · non-contrast
Comparison: none

DG SCREEN MAMMOGRAM BILATERAL
Bilateral CC and MLO view(s) were taken.

SCREENING MAMMOGRAM:
There is a fibrofatty pattern.  No masses or malignant type calcifications are identified.  
Compared with prior studies.

[R CC]
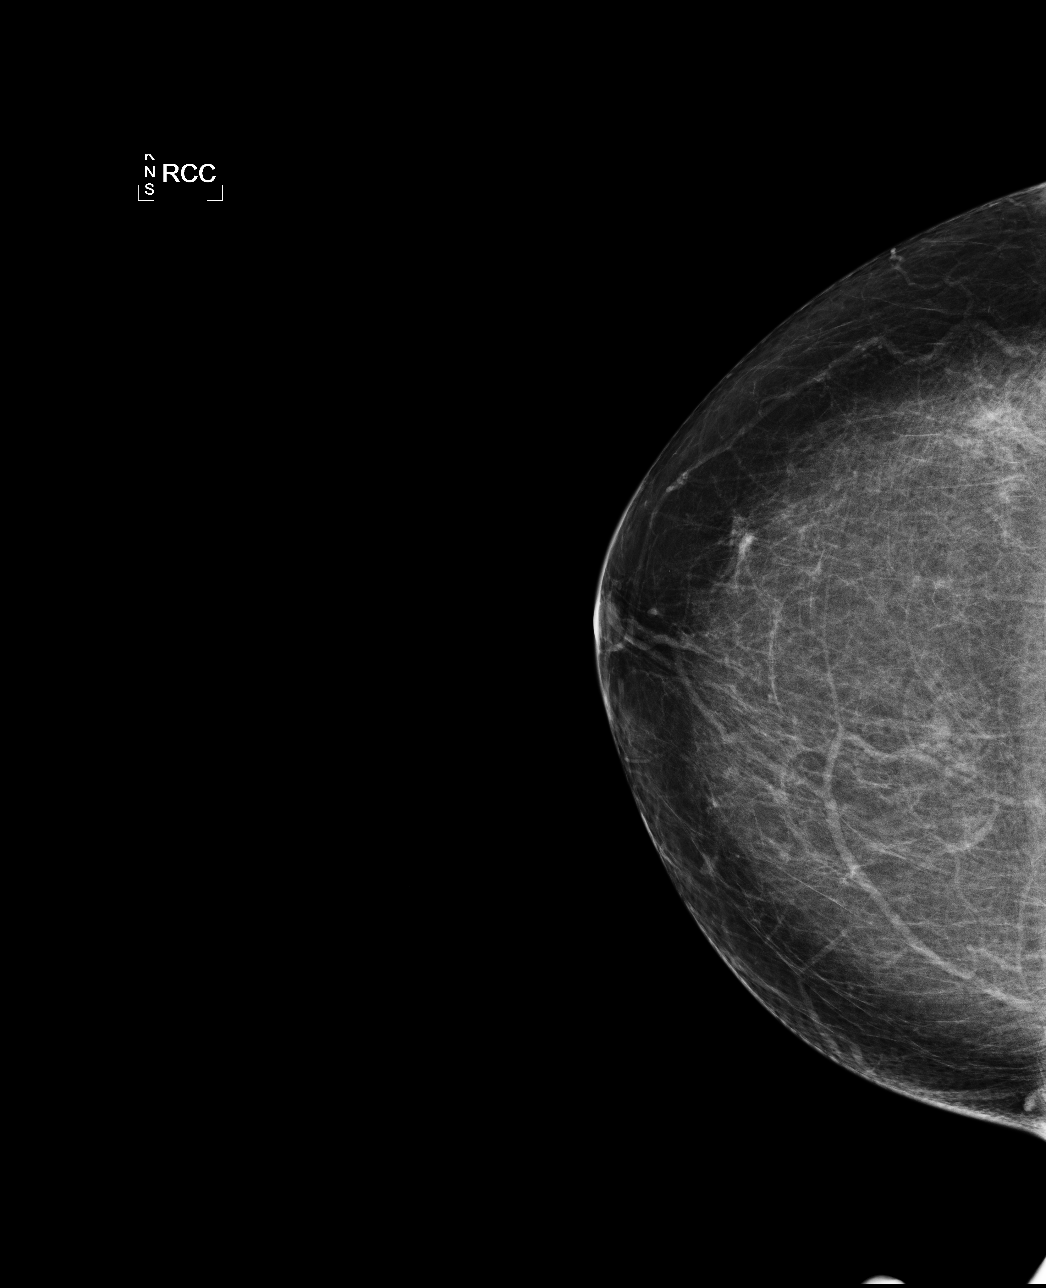

[L CC]
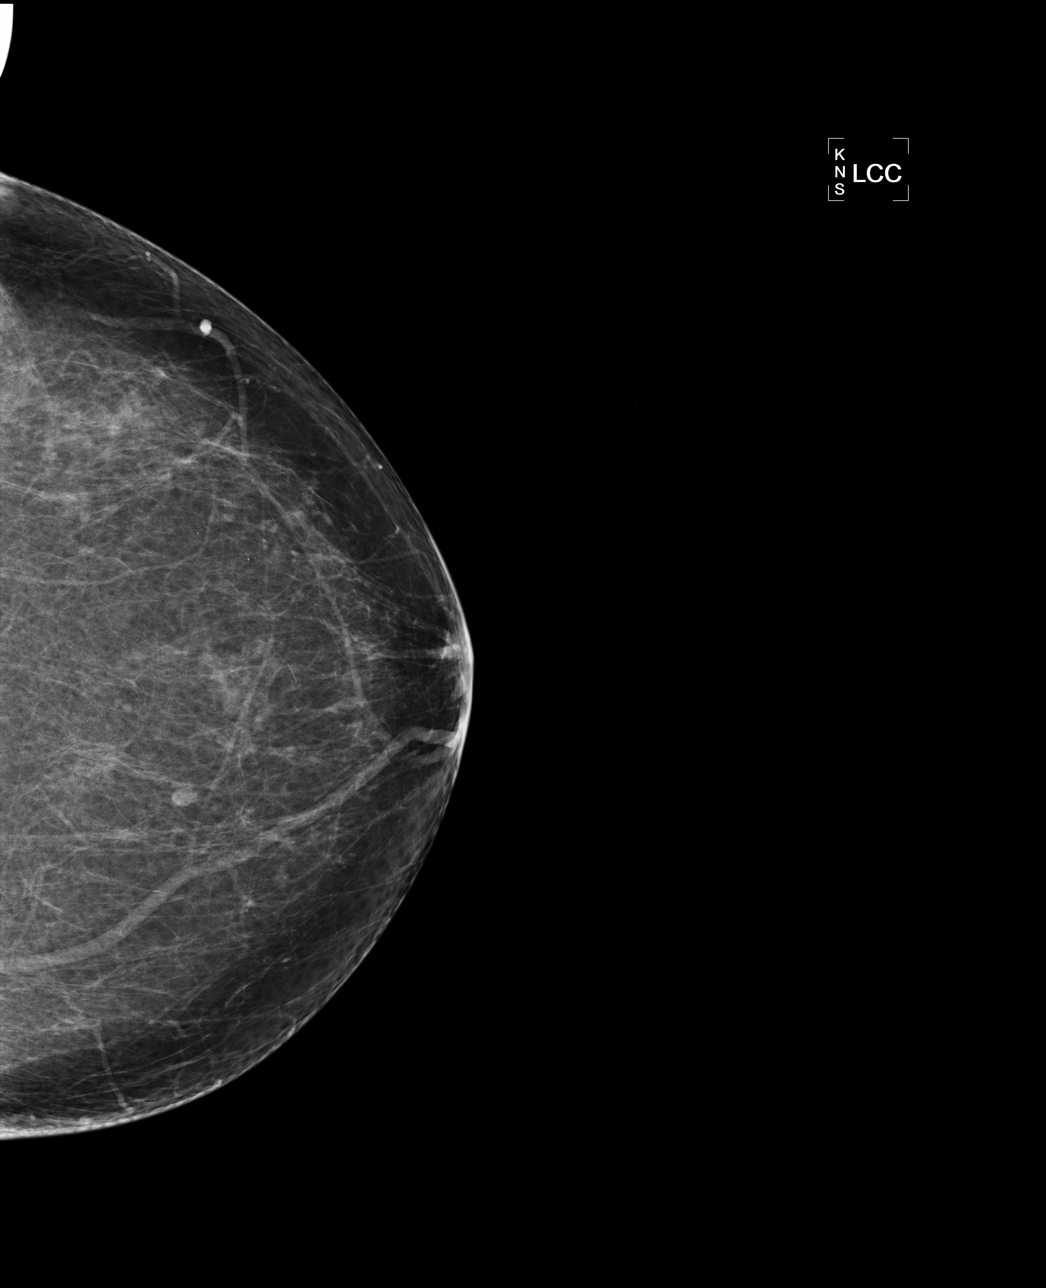

[L MLO]
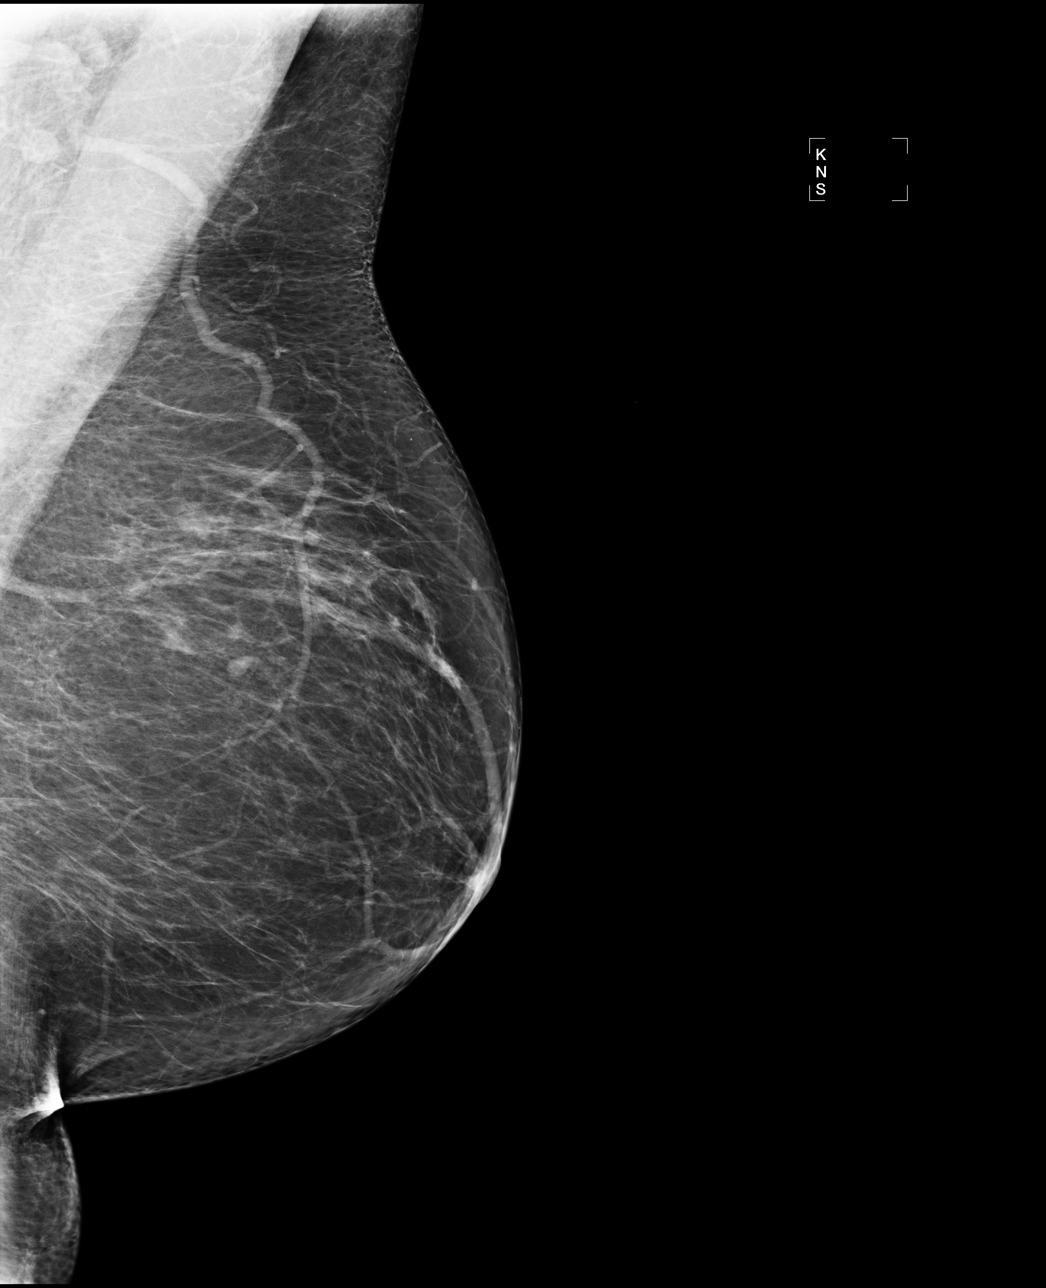

[R MLO]
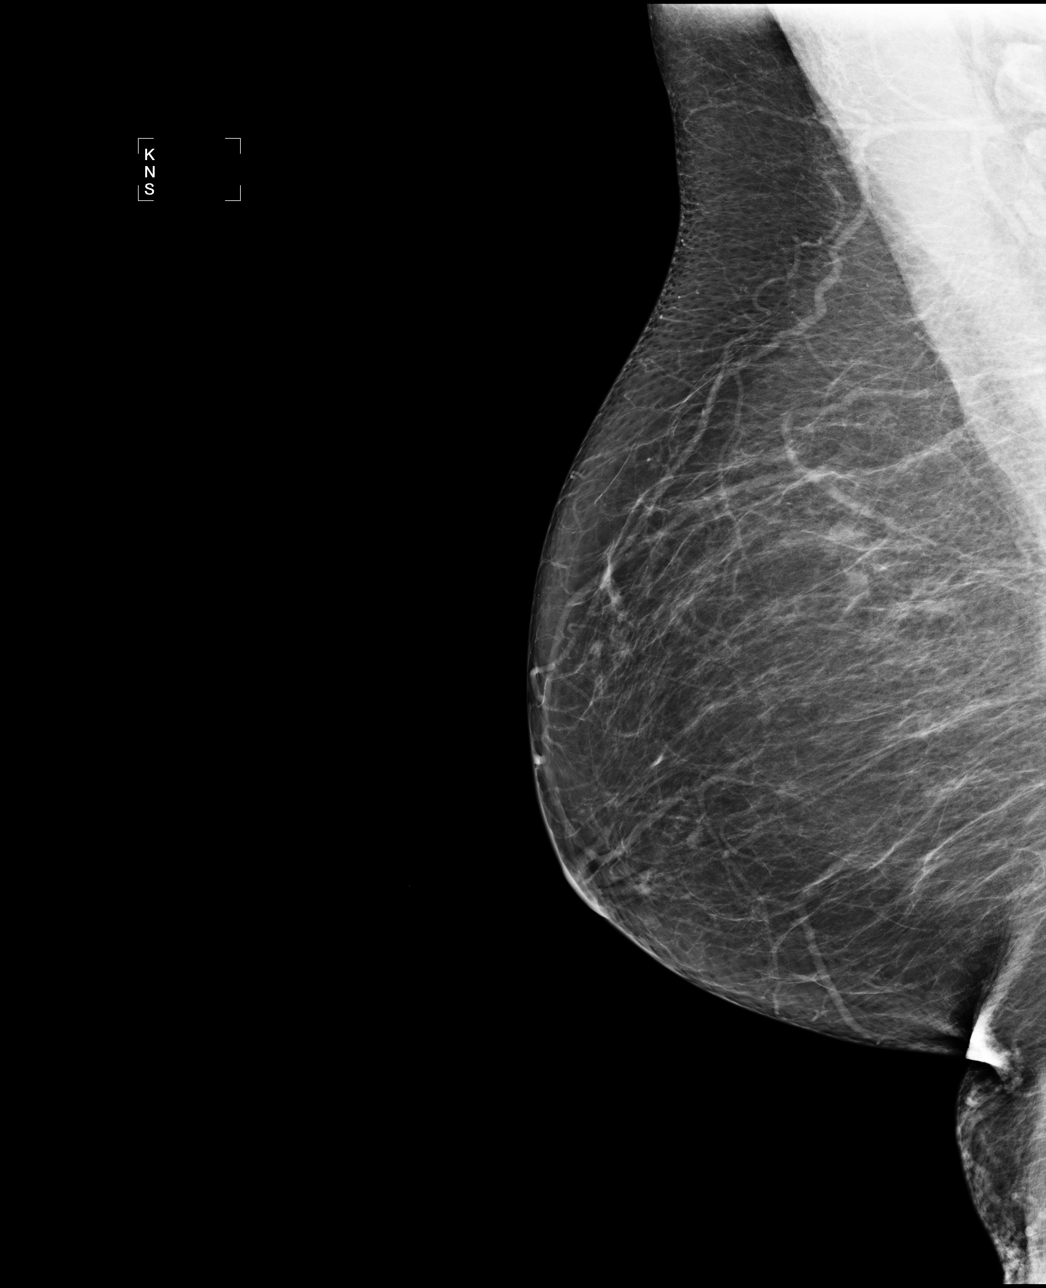

[4 of 4 positions shown; findings below may reference images not displayed]

IMPRESSION: No specific mammographic evidence of malignancy.  Next screening mammogram is recommended in one 
year.

ASSESSMENT: Negative - BI-RADS 1

Screening mammogram in 1 year.
ANALYZED BY COMPUTER AIDED DETECTION. , THIS PROCEDURE WAS A DIGITAL MAMMOGRAM.

## 2006-03-09 IMAGING — CR DG CHEST 2V
2 series · 2 of 2 positions shown · non-contrast
Comparison: [DATE]

CLINICAL DATA: Cough, asthma

CHEST - 2 VIEW:

[view not recorded (1 of 2)]
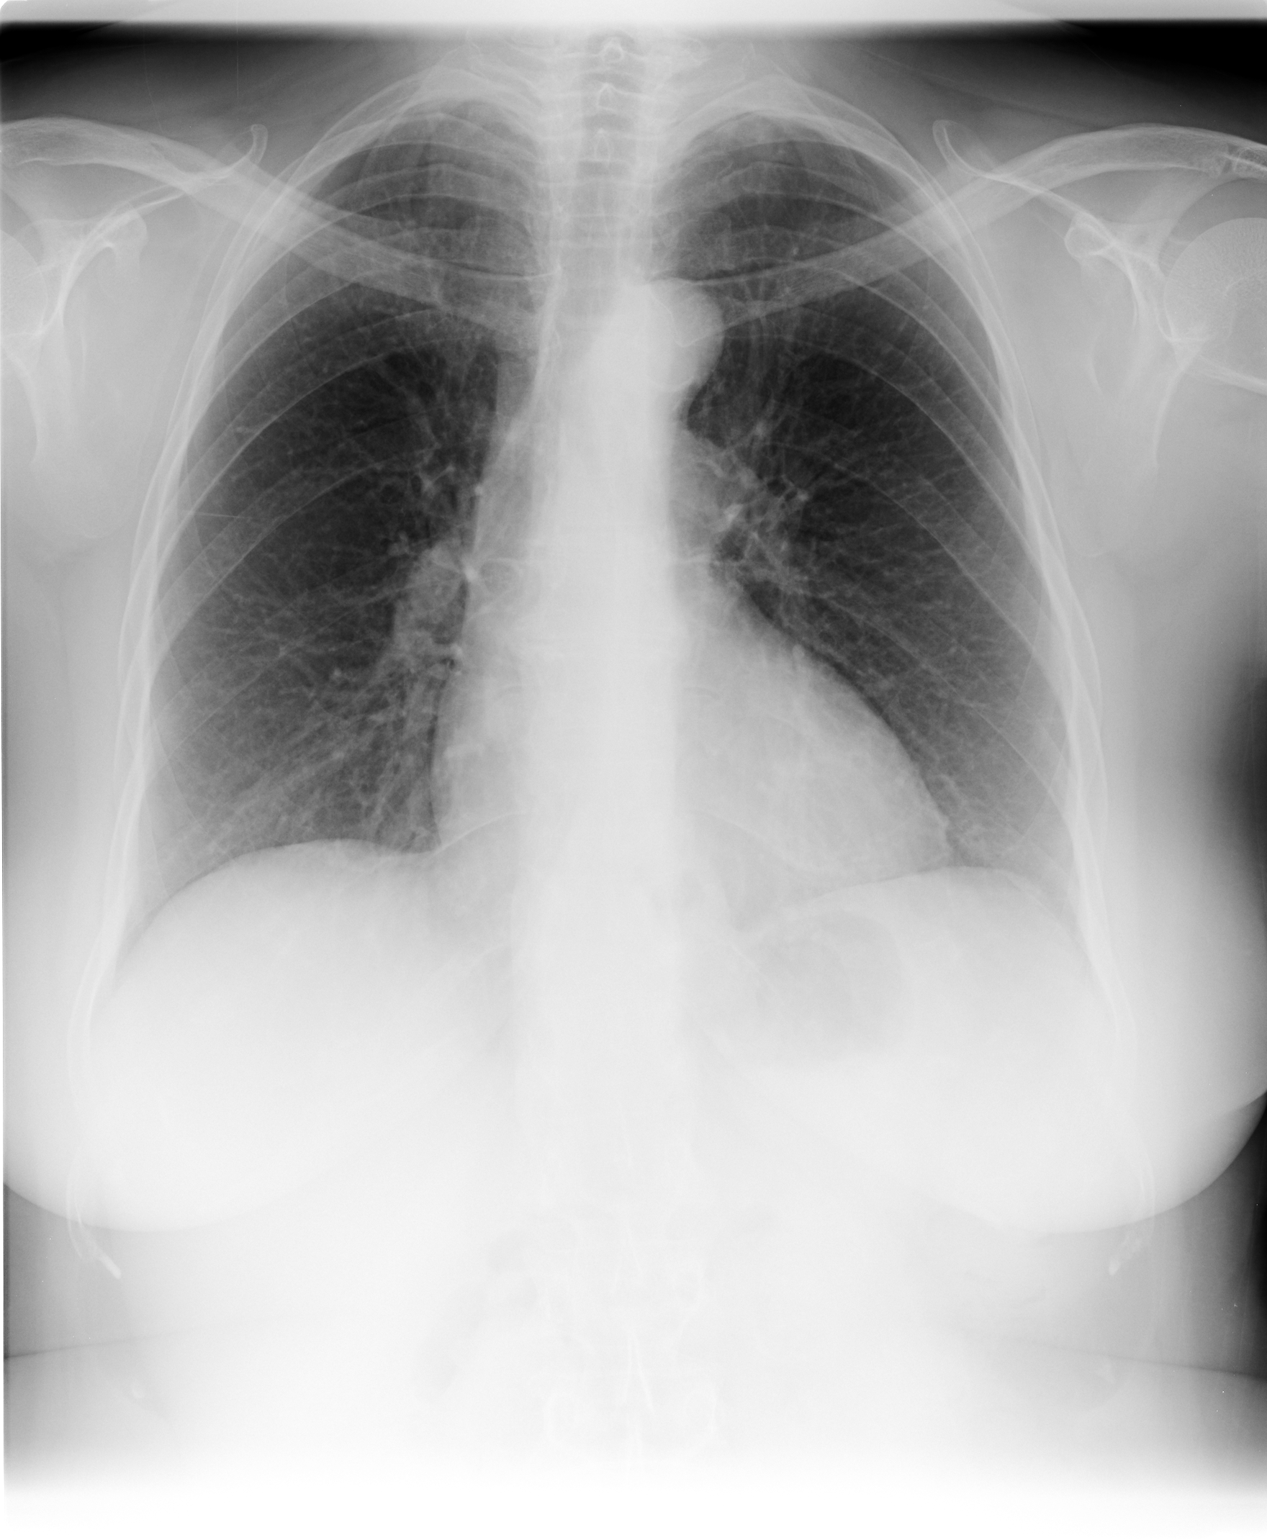

[view not recorded (2 of 2)]
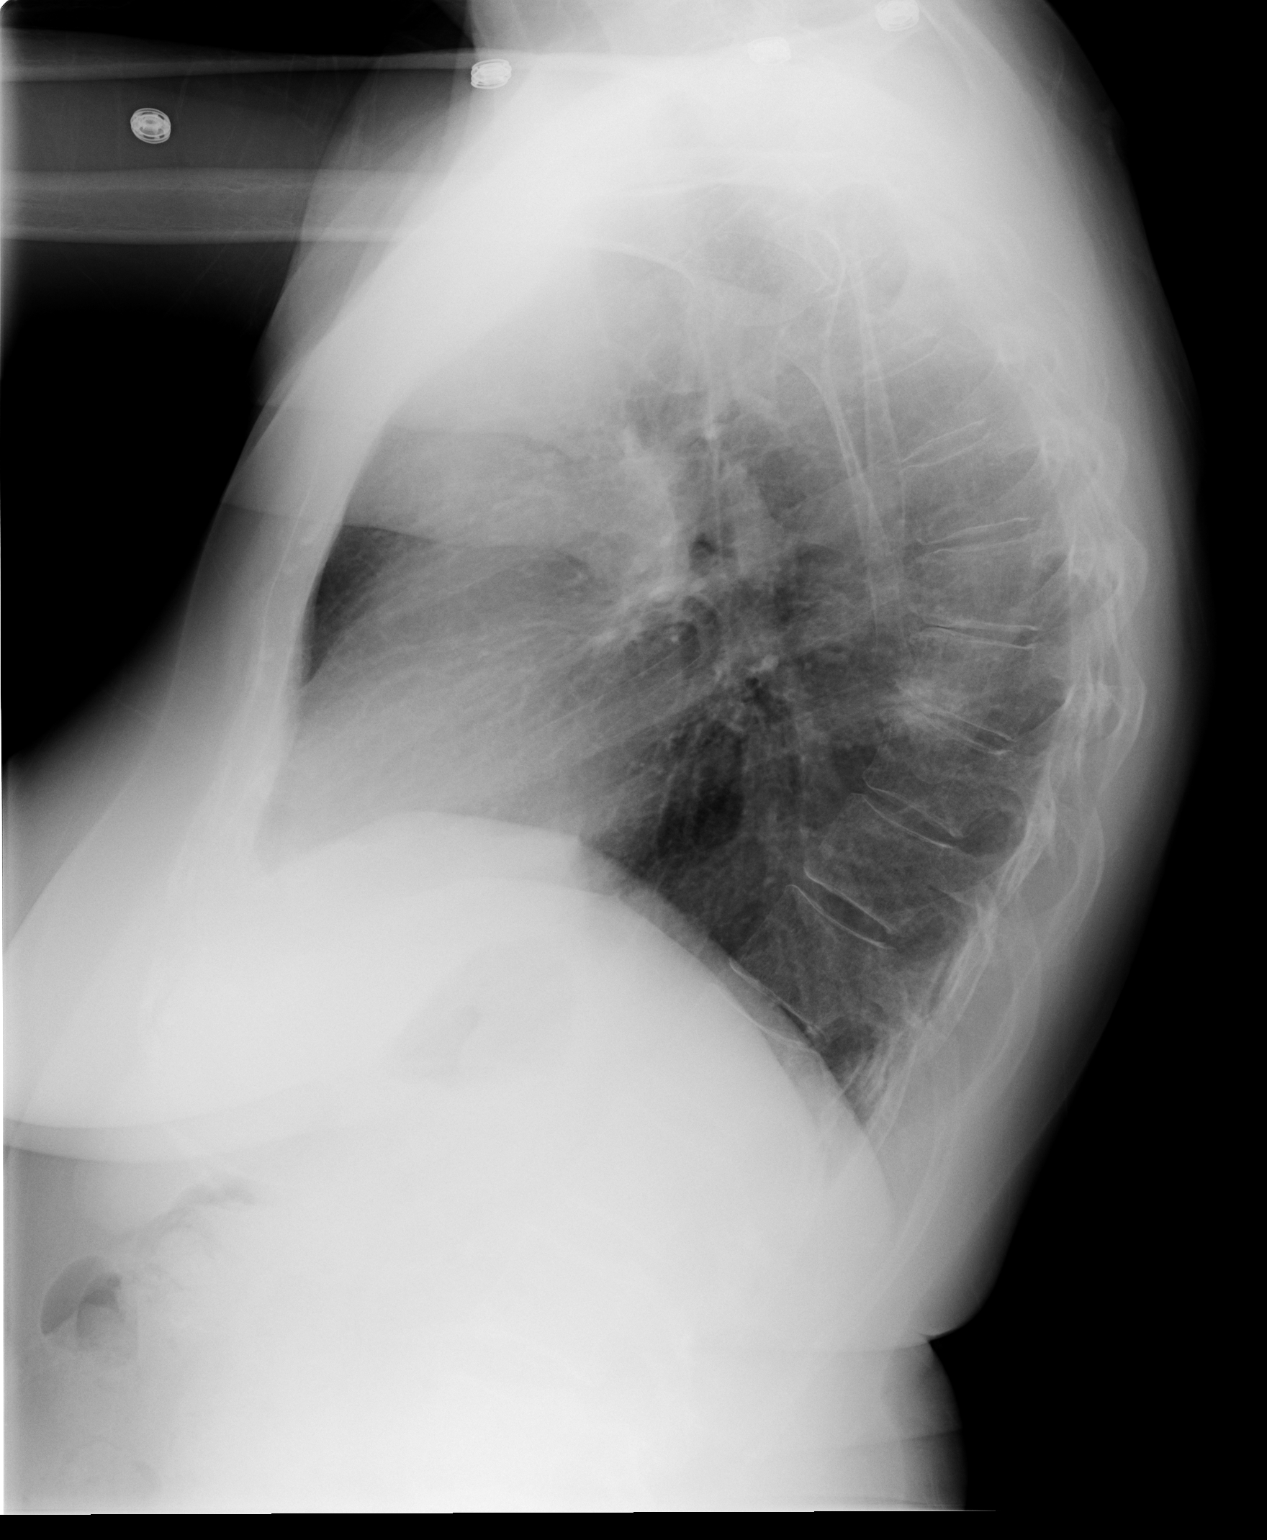

[2 of 2 positions shown; findings below may reference images not displayed]

FINDINGS: Heart is upper limits of normal in size. There is mild tortuosity of
the thoracic aorta. No focal airspace opacities or effusions. No degenerative
changes in the thoracic spine.
IMPRESSION: No active cardiopulmonary disease.

## 2006-03-13 ENCOUNTER — Encounter (INDEPENDENT_AMBULATORY_CARE_PROVIDER_SITE_OTHER): Payer: Self-pay | Admitting: Specialist

## 2006-03-14 ENCOUNTER — Inpatient Hospital Stay (HOSPITAL_COMMUNITY): Admission: RE | Admit: 2006-03-14 | Discharge: 2006-03-15 | Payer: Self-pay | Admitting: Orthopedic Surgery

## 2006-03-21 ENCOUNTER — Encounter: Admission: RE | Admit: 2006-03-21 | Discharge: 2006-03-21 | Payer: Self-pay | Admitting: Orthopedic Surgery

## 2006-03-21 IMAGING — US US EXTREM LOW VENOUS*L*
1 series · 14 of 24 positions shown · non-contrast
Comparison: None.

CLINICAL DATA: Left leg pain and swelling. 
 LEFT LOWER EXTREMITY VENOUS DOPPLER ULTRASOUND:
TECHNIQUE: Gray-scale sonography with compression, as well as color and duplex Doppler ultrasound, were performed to evaluate the deep venous system from the level of the common femoral vein through the popliteal and proximal calf veins.

[Series 1: unknown · 14 of 26 slices shown]
[im 1/26]
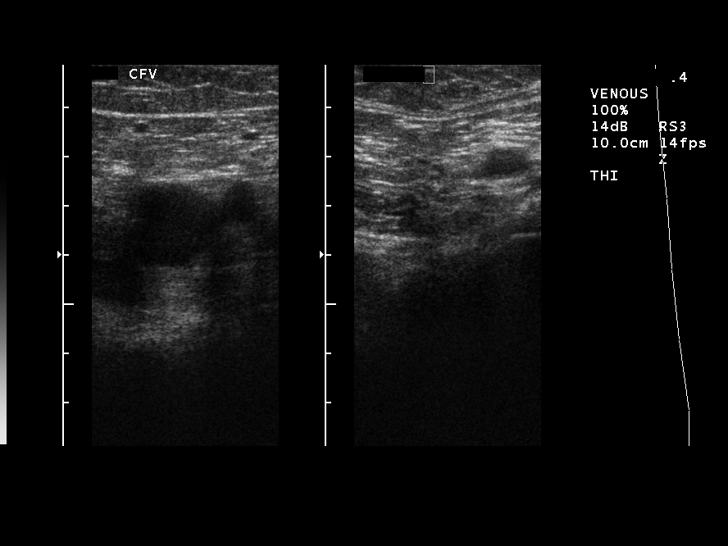
[im 3/26]
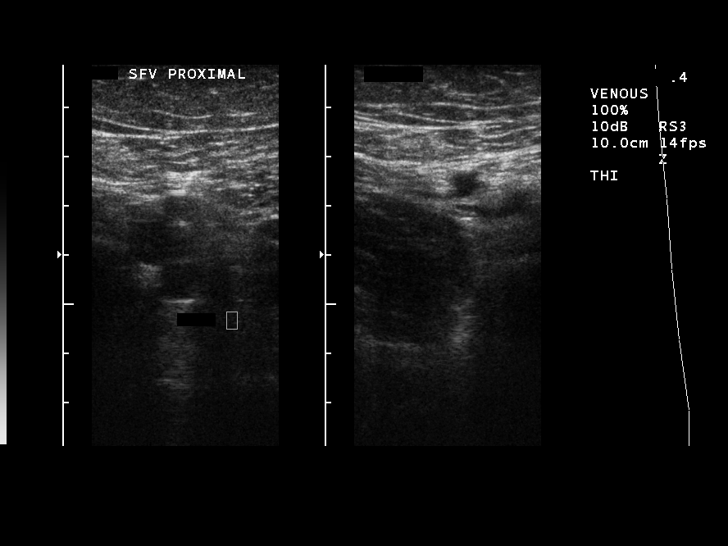
[im 5/26]
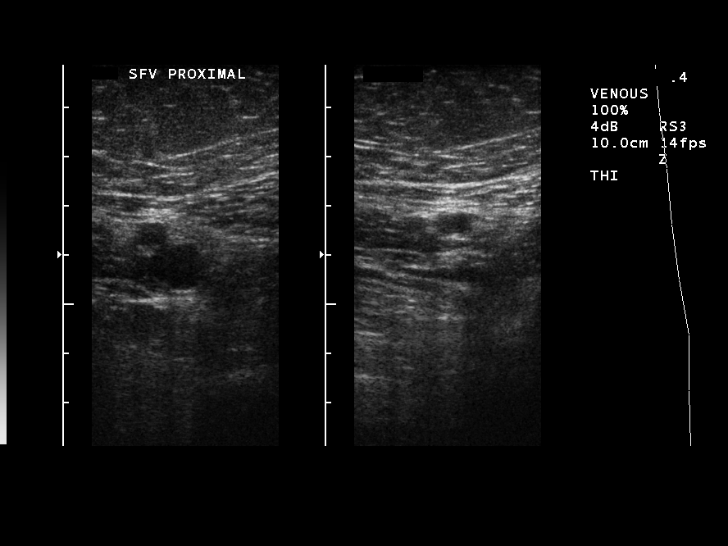
[im 7/26]
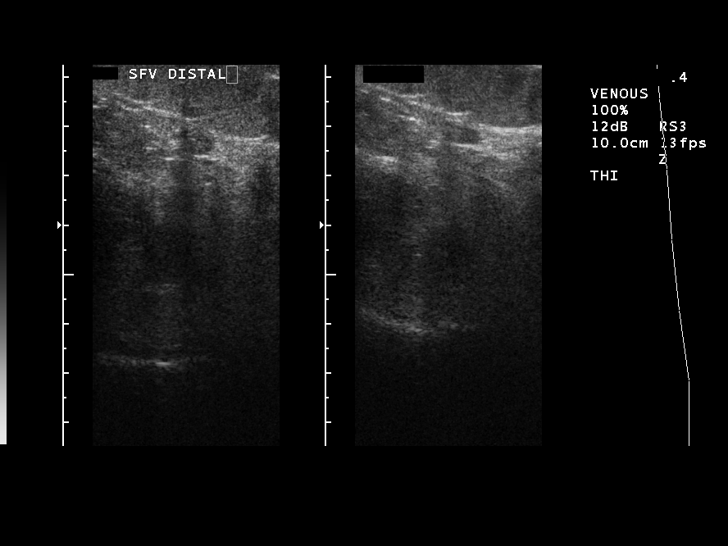
[im 8/26]
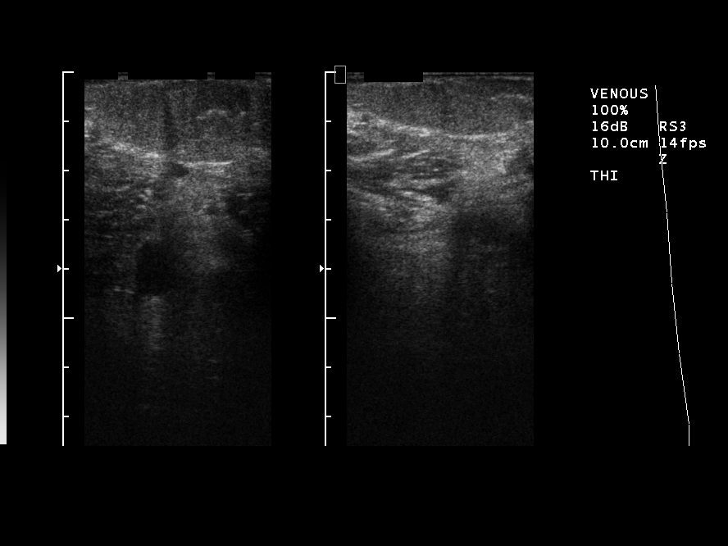
[im 10/26]
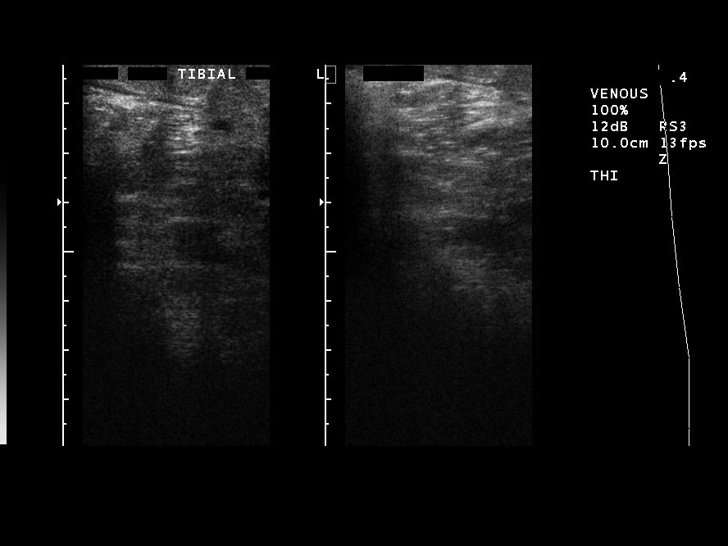
[im 12/26]
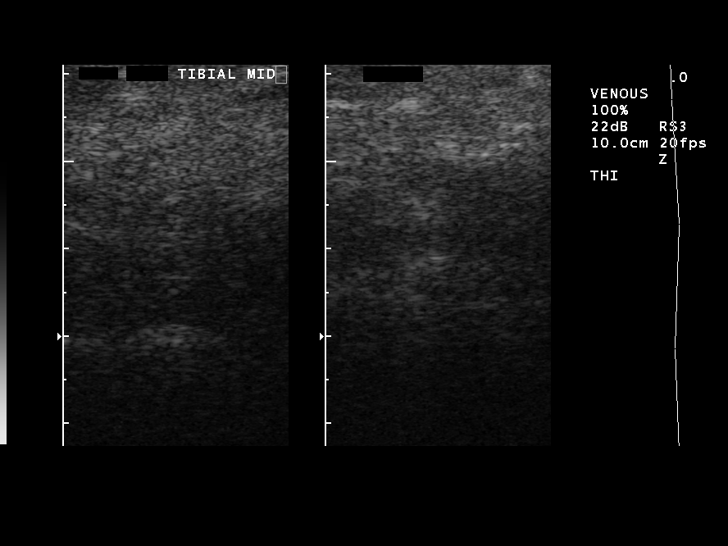
[im 14/26]
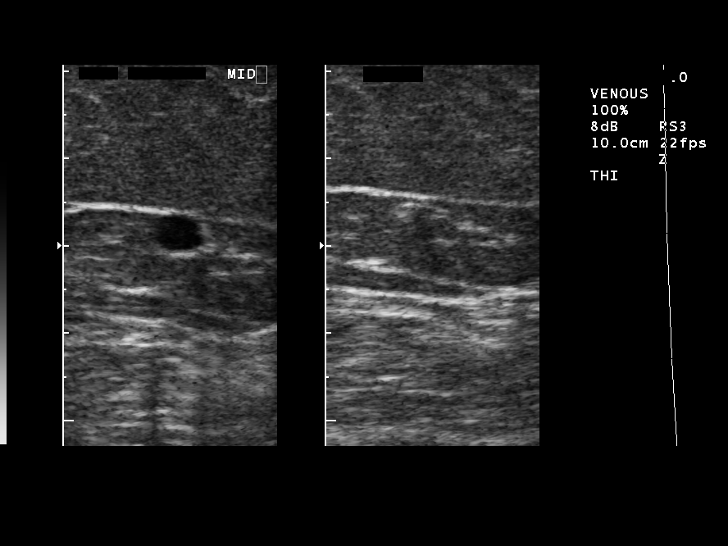
[im 16/26]
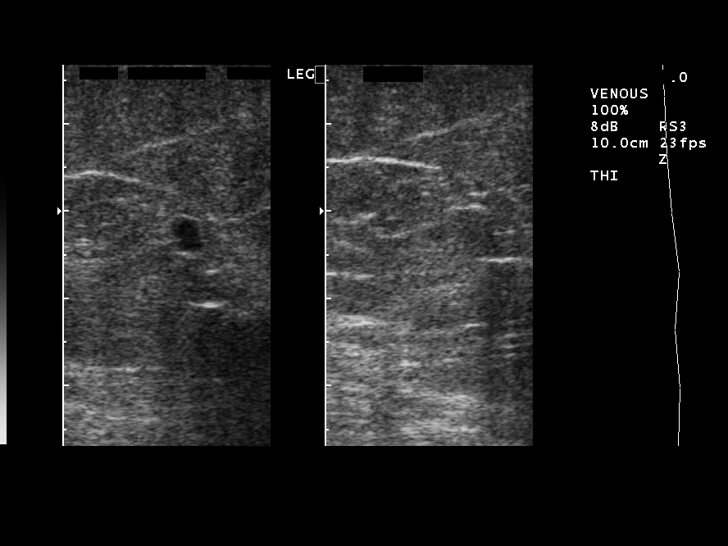
[im 18/26]
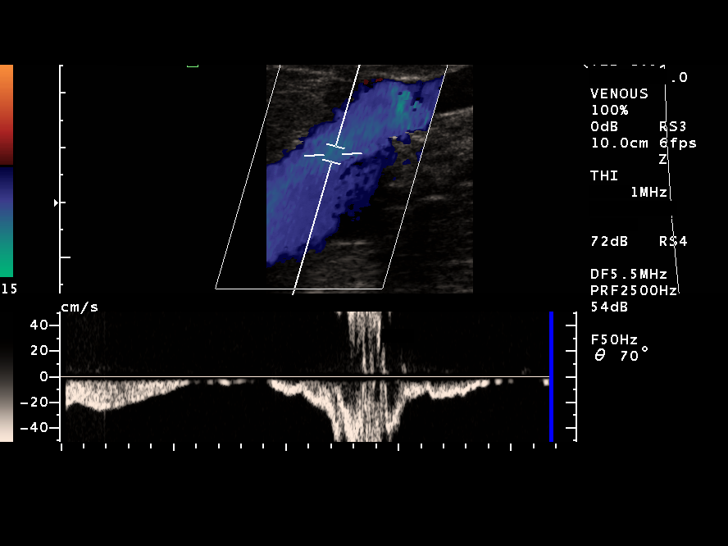
[im 20/26]
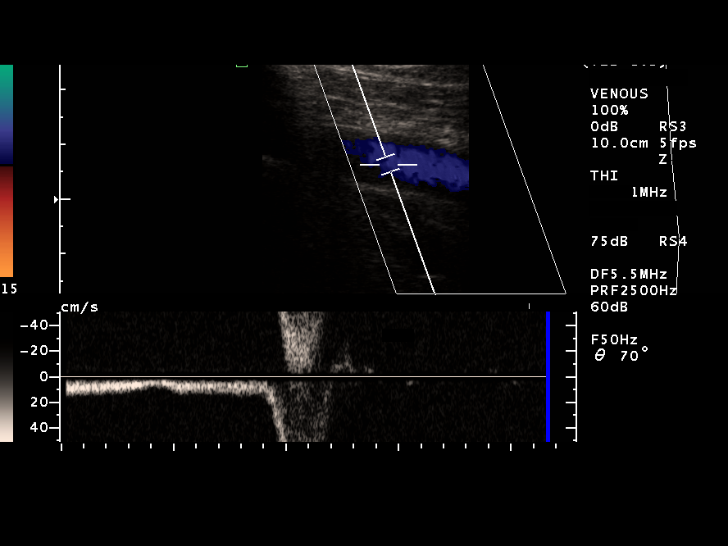
[im 21/26]
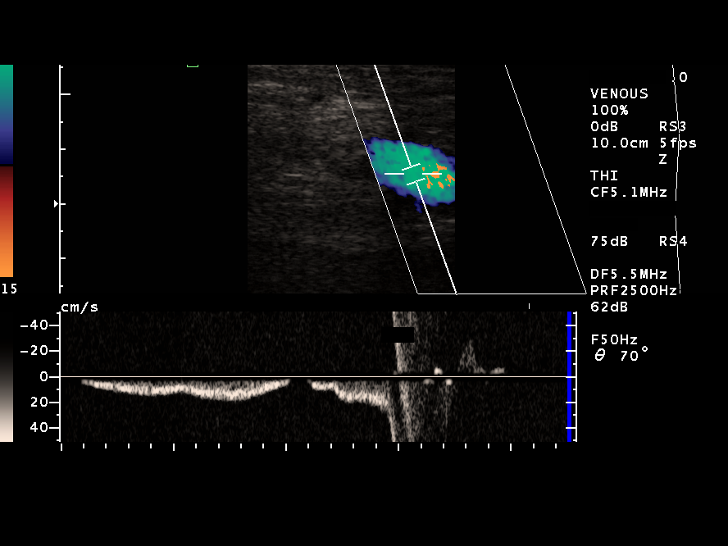
[im 23/26]
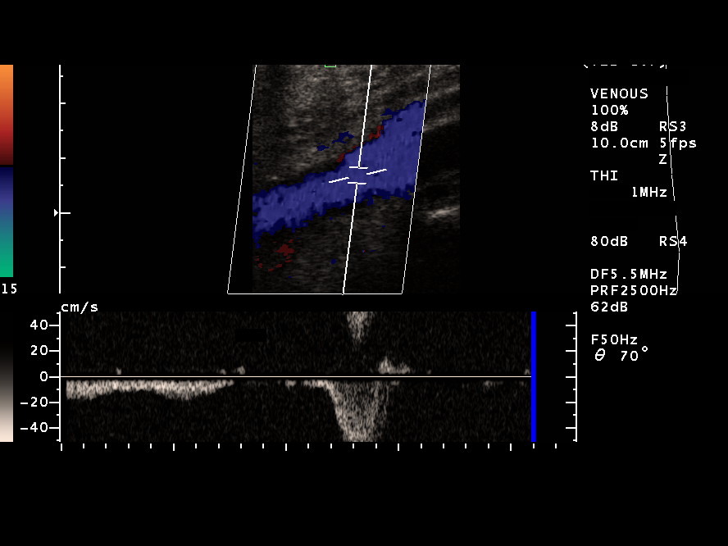
[im 26/26]
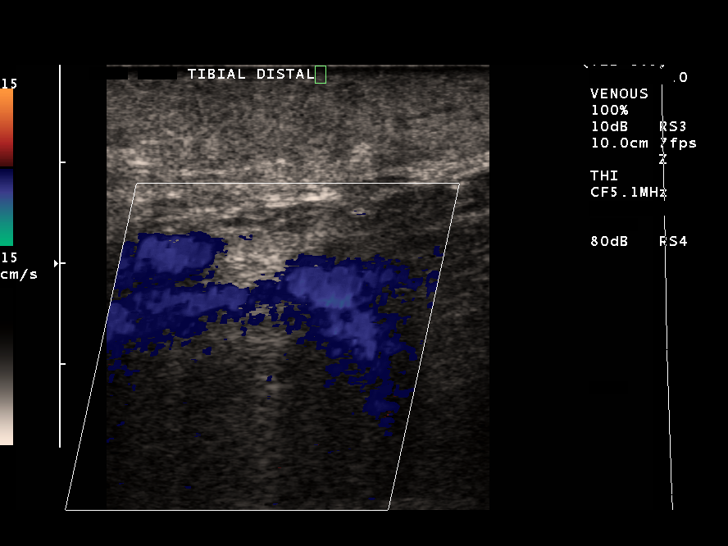

[14 of 24 positions shown; findings below may reference images not displayed]

FINDINGS: Normal flow, compressibility, and augmentation in the left common femoral, superficial femoral, and popliteal veins.  Saphenous system is patent.
IMPRESSION: No evidence of DVT in the visualized veins of the left lower extremity.

## 2006-04-05 ENCOUNTER — Encounter: Admission: RE | Admit: 2006-04-05 | Discharge: 2006-04-05 | Payer: Self-pay | Admitting: Orthopedic Surgery

## 2006-04-05 IMAGING — US US EXTREM LOW NON VASC*L*
1 series · 14 of 25 positions shown · non-contrast
Comparison: none

CLINICAL DATA: Calf surgery three weeks ago.  Redness and pain, rule out abscess.
ULTRASOUND OF THE LEFT CALF:
There is a complex fluid collection in the subcutaneous tissues just deep to the incision in the left calf.  This measures approximately 12 x 20 mm in transverse dimension and approximately 4 cm in length in the sagittal plane.  This has echogenic foci within it and appears to be complex fluid.  This is likely an abscess, however, it could be a hematoma.  I do not see any extension into the muscle.  The calf is red and tender, and this is presumably an infection.

[Series 1: us extrem low non vasc*left* · 0.10mm/px · 14 of 27 slices shown]
[im 1/27]
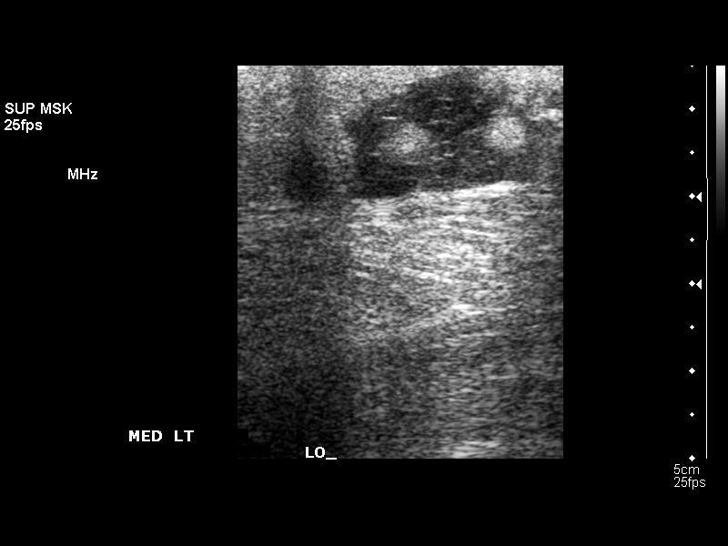
[im 3/27]
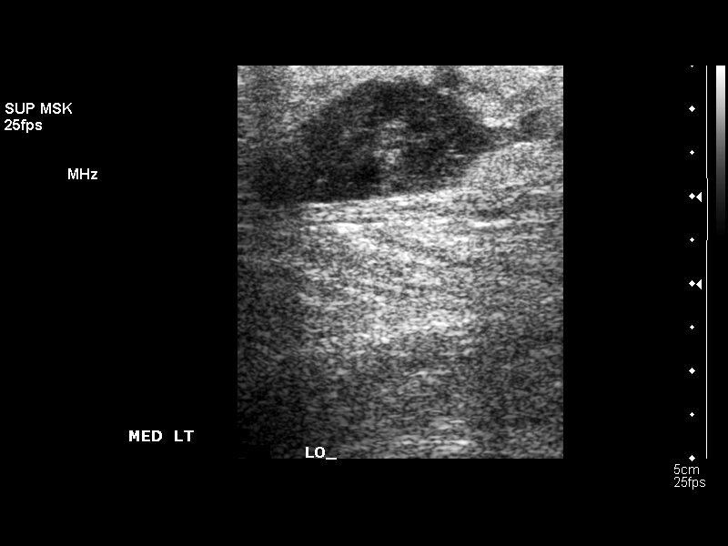
[im 5/27]
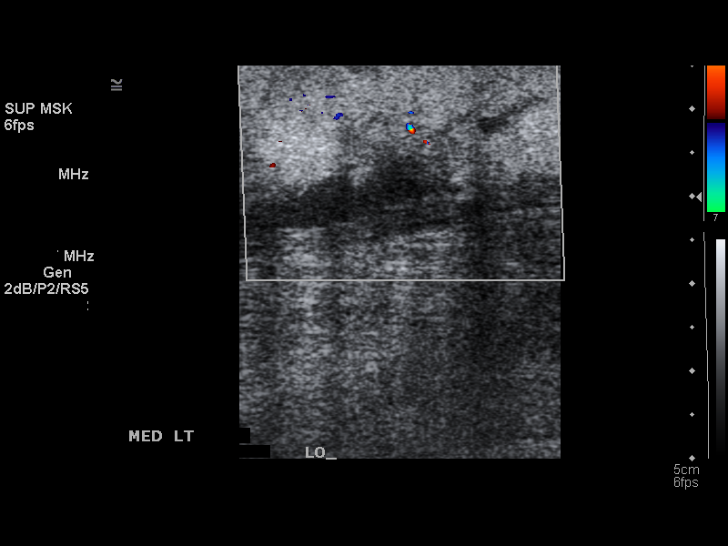
[im 7/27]
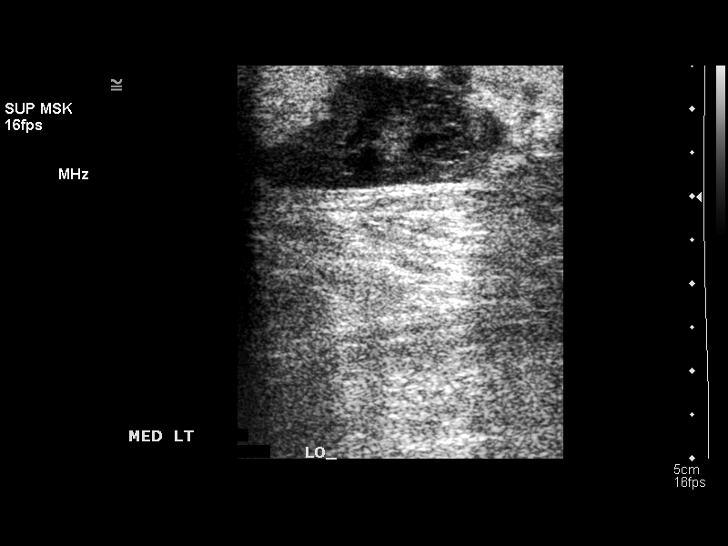
[im 9/27]
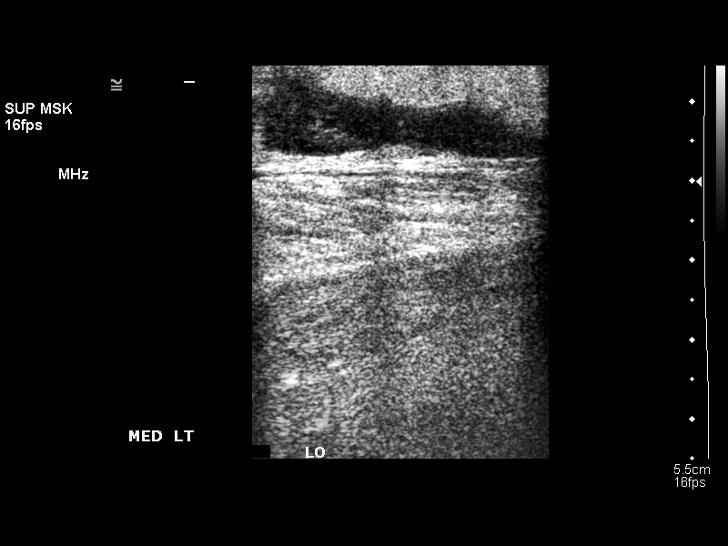
[im 10/27]
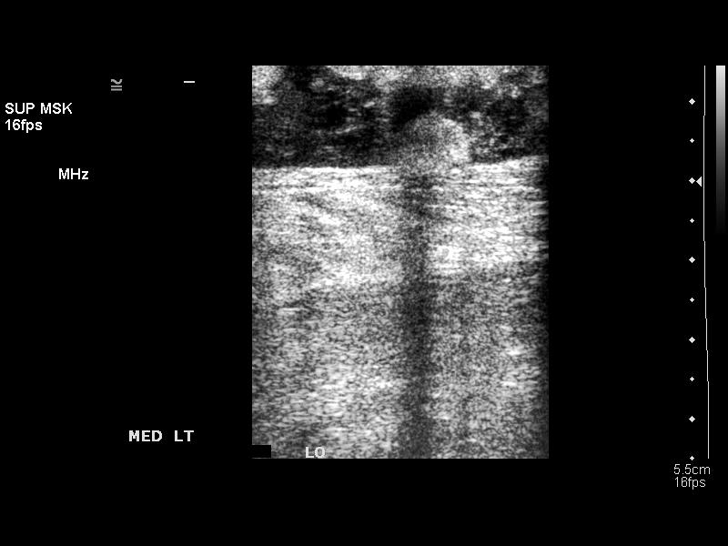
[im 12/27]
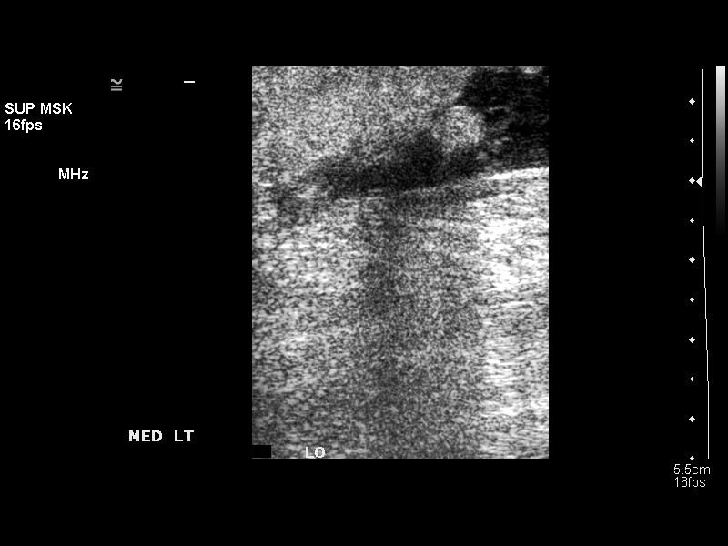
[im 15/27]
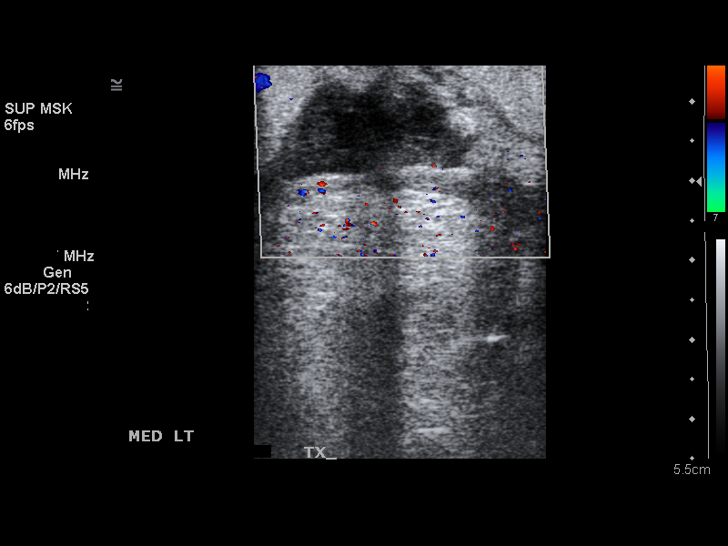
[im 17/27]
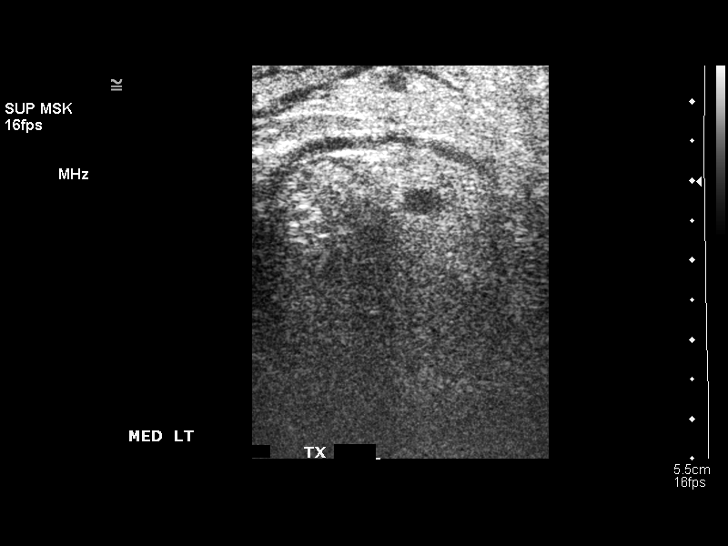
[im 18/27]
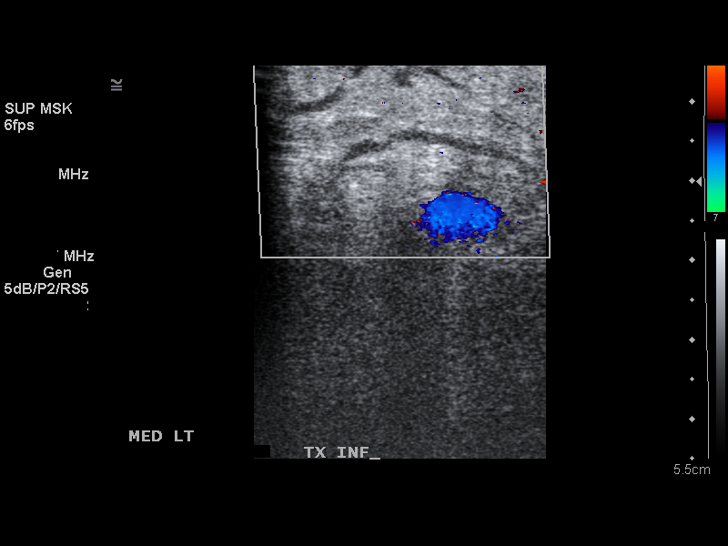
[im 20/27]
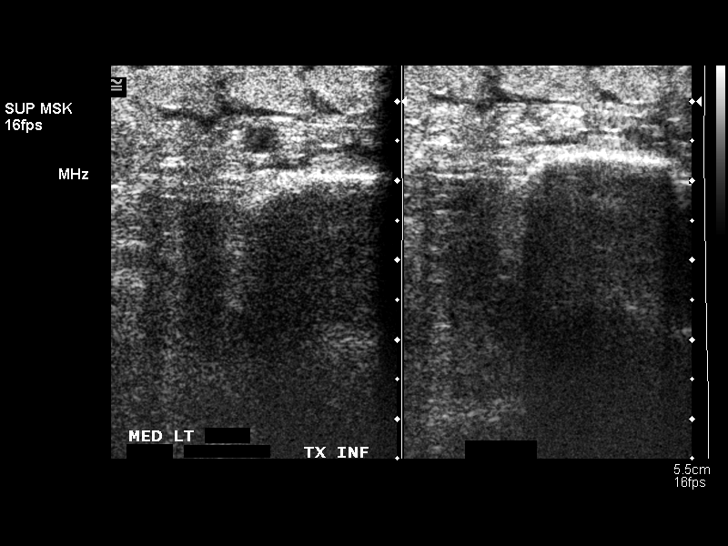
[im 22/27]
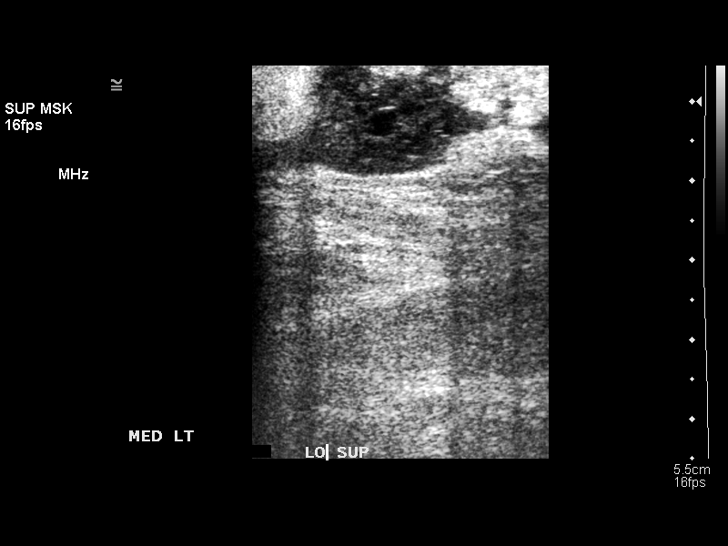
[im 24/27]
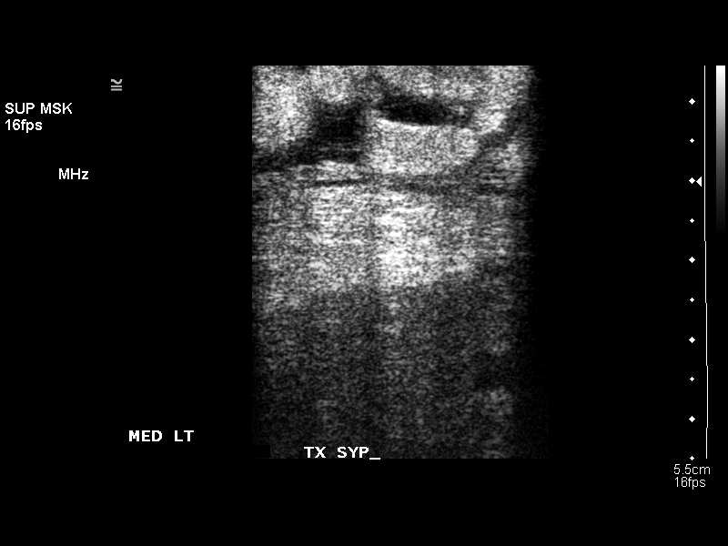
[im 27/27]
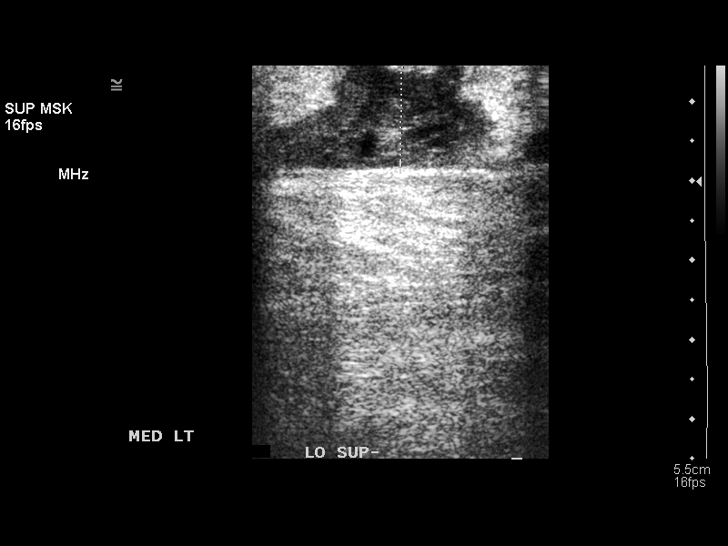

[14 of 25 positions shown; findings below may reference images not displayed]

IMPRESSION: Complex fluid collection in the subcutaneous tissues of the left calf just deep to the incision, presumably  an abscess.  
The results were called to Dr. ROSE ANNA at [4S] hours on [DATE].

## 2006-04-06 ENCOUNTER — Ambulatory Visit (HOSPITAL_COMMUNITY): Admission: RE | Admit: 2006-04-06 | Discharge: 2006-04-08 | Payer: Self-pay | Admitting: Orthopedic Surgery

## 2006-04-13 ENCOUNTER — Ambulatory Visit (HOSPITAL_COMMUNITY): Admission: RE | Admit: 2006-04-13 | Discharge: 2006-04-13 | Payer: Self-pay | Admitting: Orthopedic Surgery

## 2006-04-13 ENCOUNTER — Encounter: Admission: RE | Admit: 2006-04-13 | Discharge: 2006-04-13 | Payer: Self-pay | Admitting: Orthopedic Surgery

## 2006-04-13 IMAGING — NM NM PULM PERFUSION & VENT (REBREATHING & WASHOUT)
3 series · 18 of 18 positions shown · non-contrast
Comparison: none

CLINICAL DATA: Shortness of breath, chest pain. Allergic to CT contrast.
NUCLEAR MEDICINE VENTILATION - PERFUSION LUNG SCAN:
TECHNIQUE: Wash-in, equilibrium, and washout phase ventilation images were obtained using [H4] gas.  Perfusion images were obtained in multiple projections after intravenous injection of [H4] MAA.
Radiopharmaceutical:  4.9 mCi [H4] gas and 4.6 mCi [H4] MAA.

[Series 1: vq lung scan · 4.75mm/px · 6 of 30 frames shown (1 of 3)]
[frame 3/30  full-range]
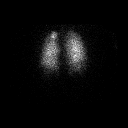
[frame 8/30  full-range]
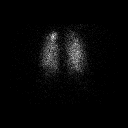
[frame 13/30]
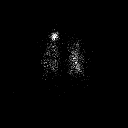
[frame 18/30]
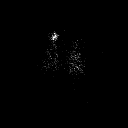
[frame 23/30]
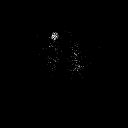
[frame 28/30]
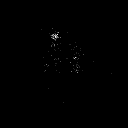

[Series 1: vq lung scan · 4.23mm/px · 6 of 8 frames shown (2 of 3)]
[frame 1/8]
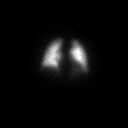
[frame 2/8]
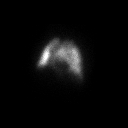
[frame 4/8]
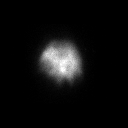
[frame 5/8]
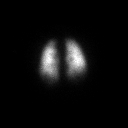
[frame 6/8]
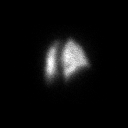
[frame 8/8]
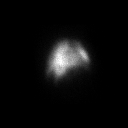

[Series 1: vq lung scan · 4.23mm/px · 6 of 30 frames shown (3 of 3)]
[frame 3/30  full-range]
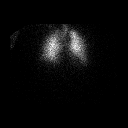
[frame 8/30  full-range]
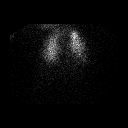
[frame 13/30  full-range]
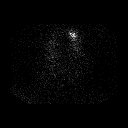
[frame 18/30]
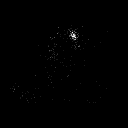
[frame 23/30]
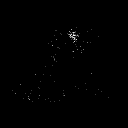
[frame 28/30]
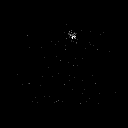

[18 of 18 positions shown; findings below may reference images not displayed]

FINDINGS: Perfusion scan demonstrates no segmental pleural-based wedge-shaped  defects in either lung.  Symmetric ventilation is evident.  No perfusion ventilation mismatched defects.
IMPRESSION: Low probability for pulmonary embolus.

## 2006-04-13 IMAGING — CR DG CHEST 2V
2 series · 2 of 2 positions shown · non-contrast
Comparison: [DATE] and [DATE].

CLINICAL DATA: Shortness of breath, chest pain.
 CHEST - 2 VIEW:

[view not recorded (1 of 2)]
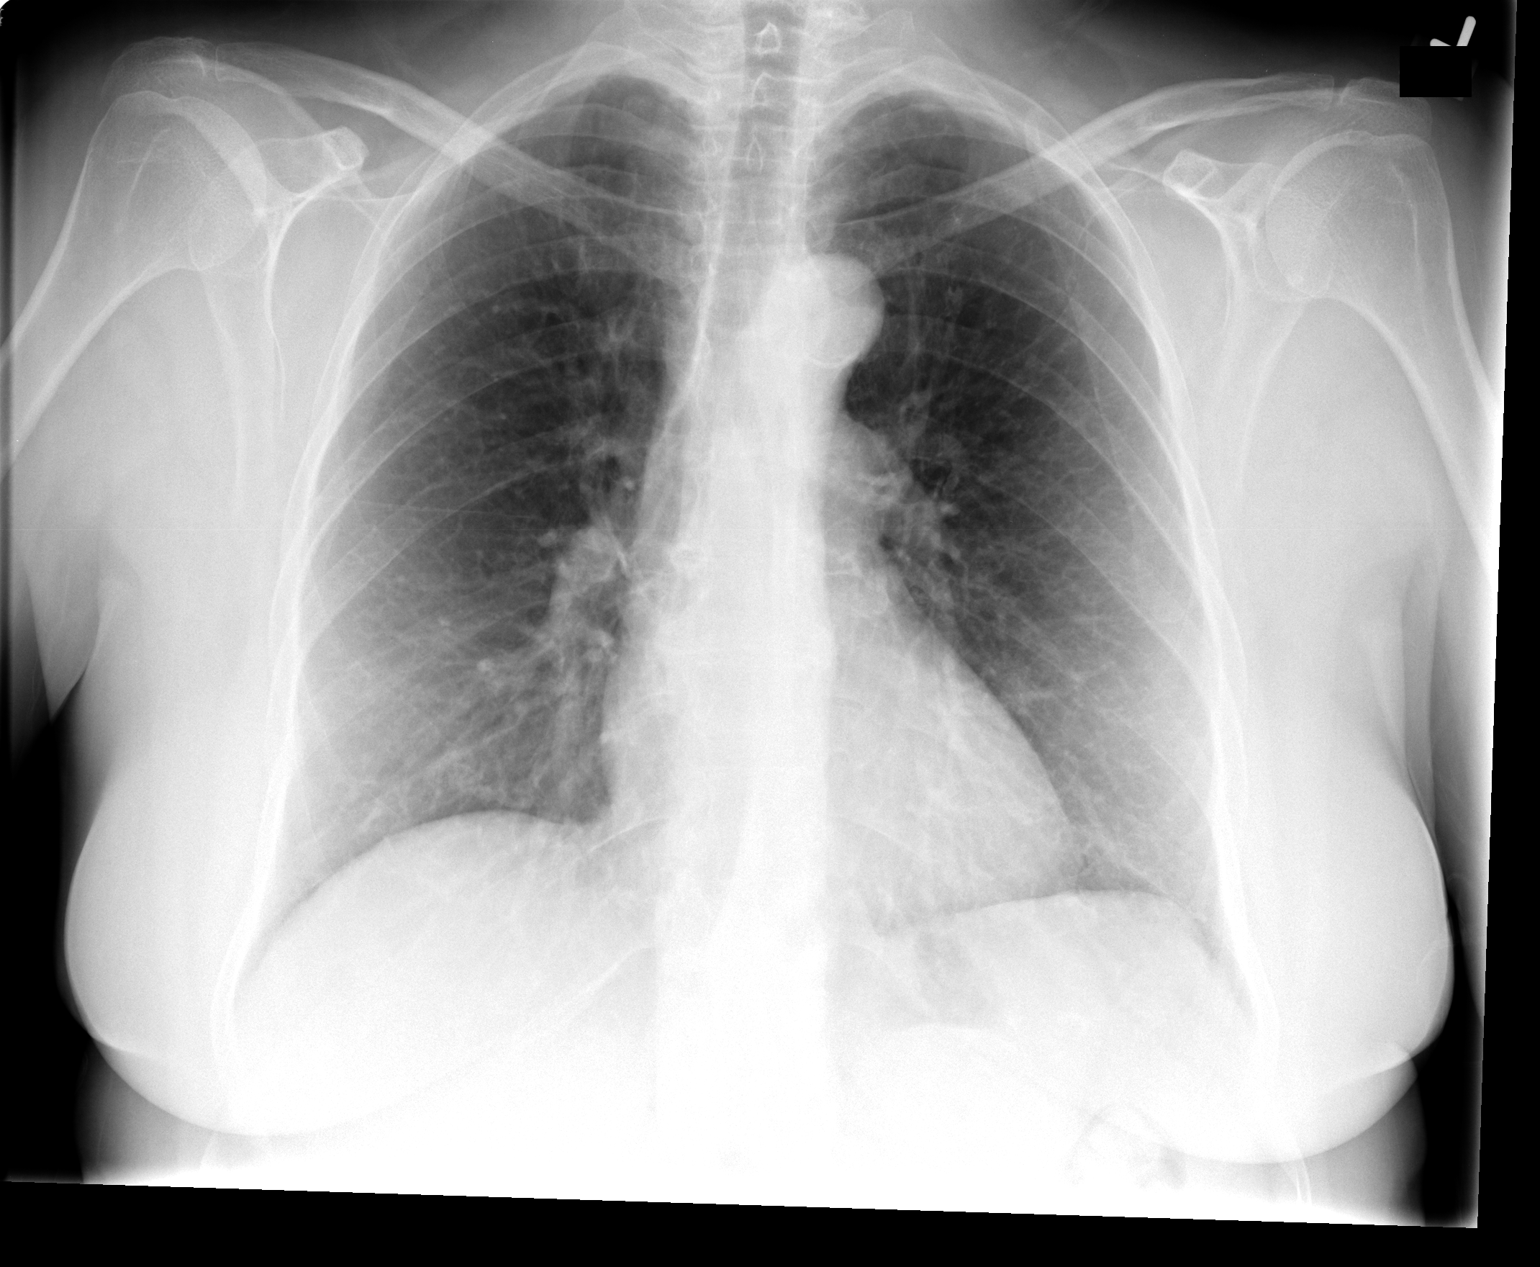

[view not recorded (2 of 2)]
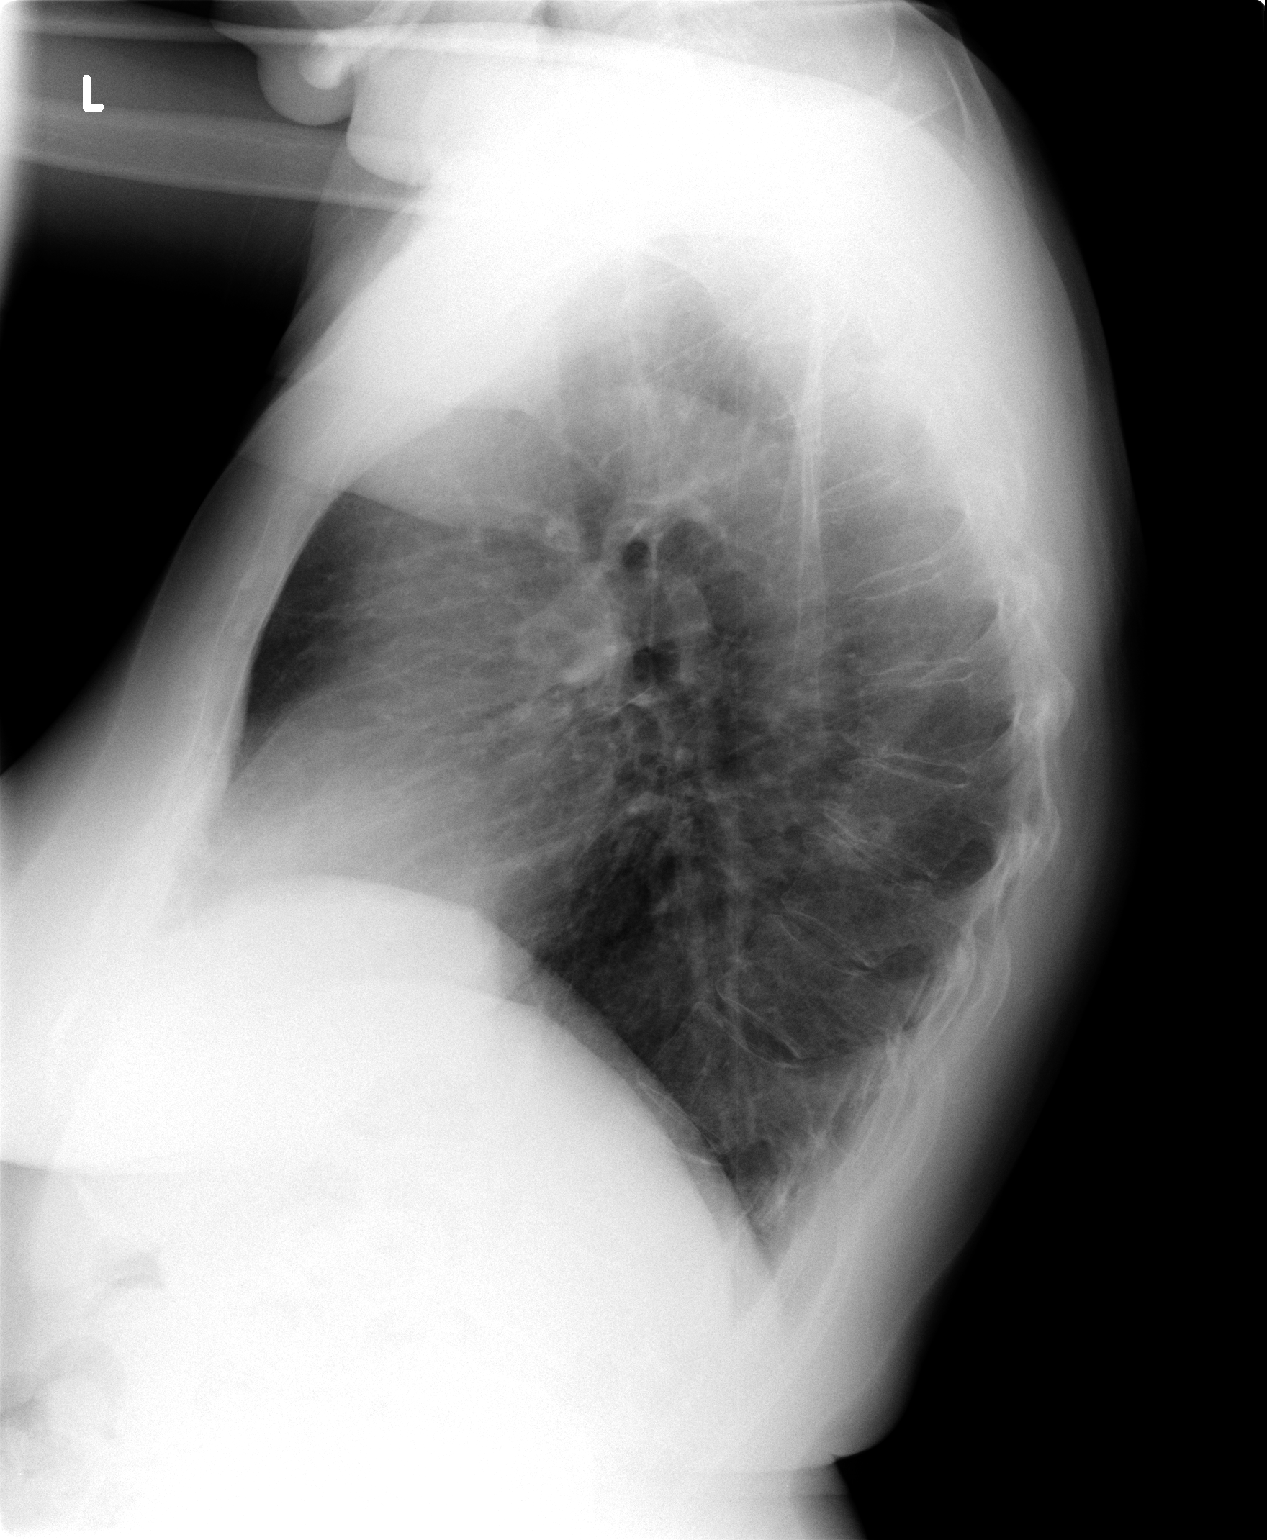

[2 of 2 positions shown; findings below may reference images not displayed]

FINDINGS: Mild central airway thickening without acute pneumonia, consolidation, edema, effusion or pneumothorax.  Normal heart size.  Atherosclerotic aorta.  Exam is stable.
 Lateral view demonstrates mid thoracic spondylosis and degenerative disk disease.
IMPRESSION: Stable chest exam without acute airspace disease.

## 2006-04-13 IMAGING — US US EXTREM LOW VENOUS*L*
1 series · 14 of 24 positions shown · non-contrast
Comparison: [DATE].

CLINICAL DATA: Left lower extremity pain and swelling. 
 LEFT LOWER EXTREMITY VENOUS DOPPLER ULTRASOUND:
TECHNIQUE: Gray-scale sonography with compression, as well as color and duplex Doppler ultrasound, were performed to evaluate the deep venous system from the level of the common femoral vein through the popliteal and proximal calf veins.

[Series 1: unknown · 14 of 29 slices shown]
[im 1/29]
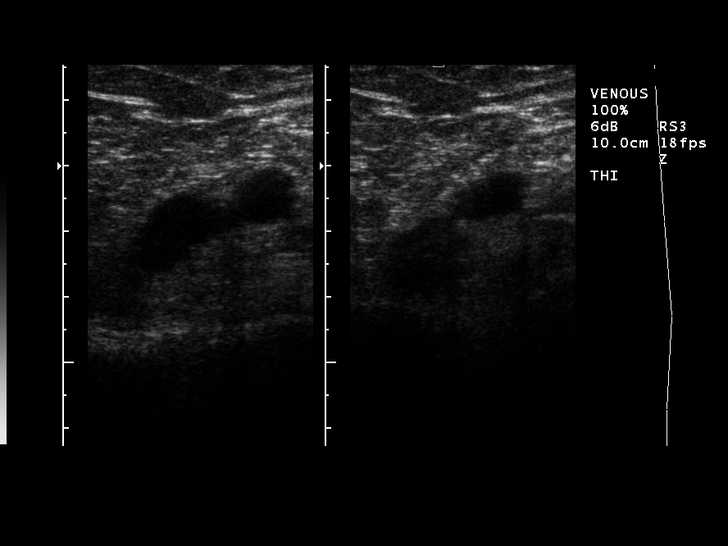
[im 3/29]
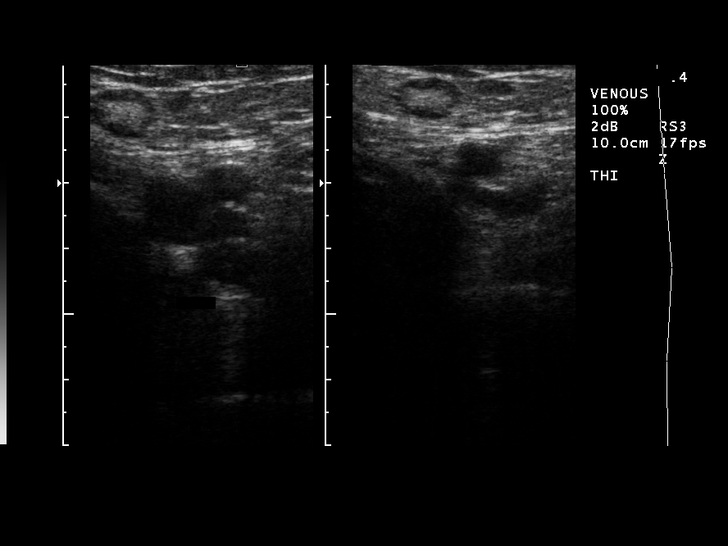
[im 5/29]
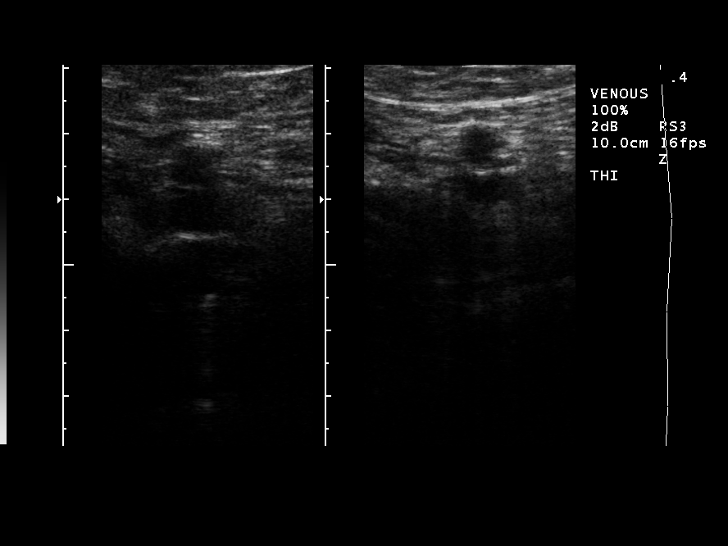
[im 8/29]
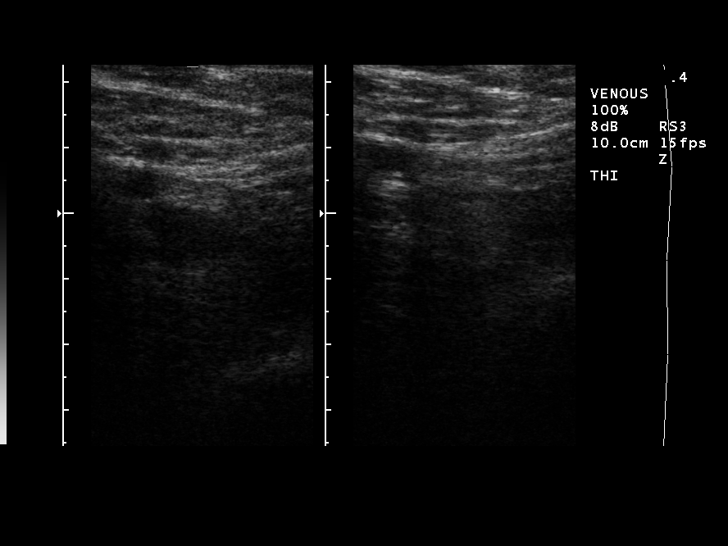
[im 9/29]
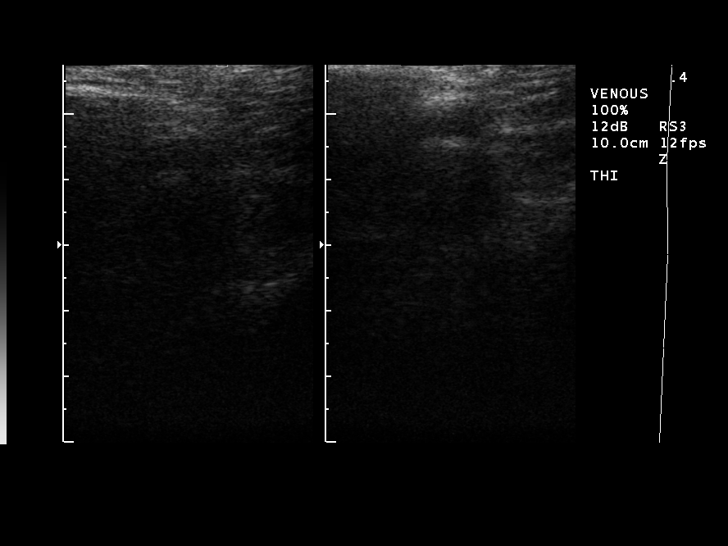
[im 11/29]
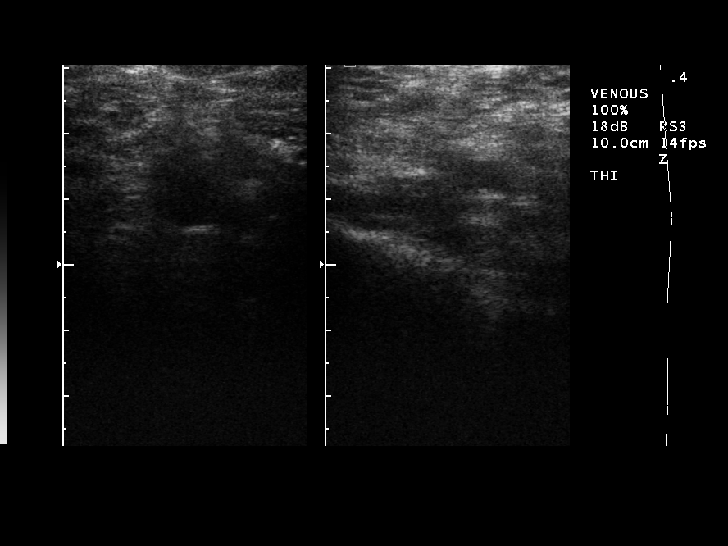
[im 14/29]
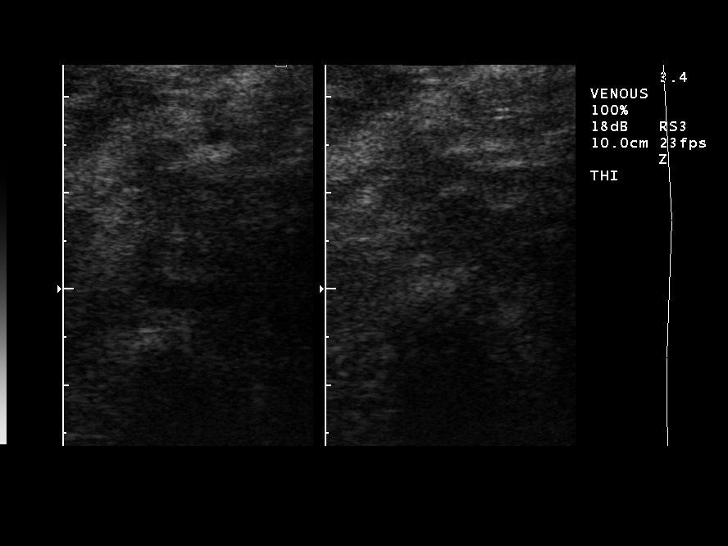
[im 15/29]
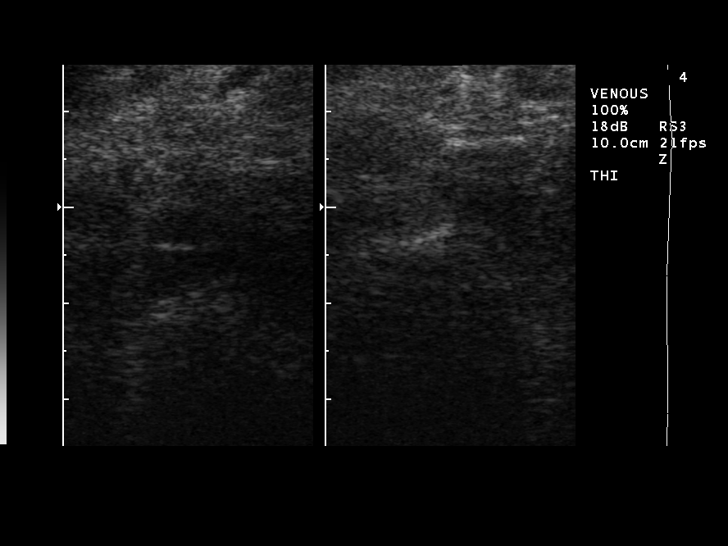
[im 18/29]
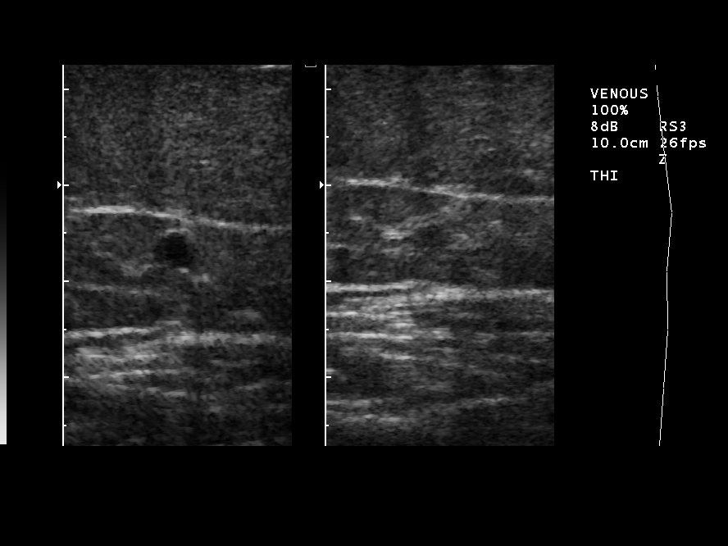
[im 20/29]
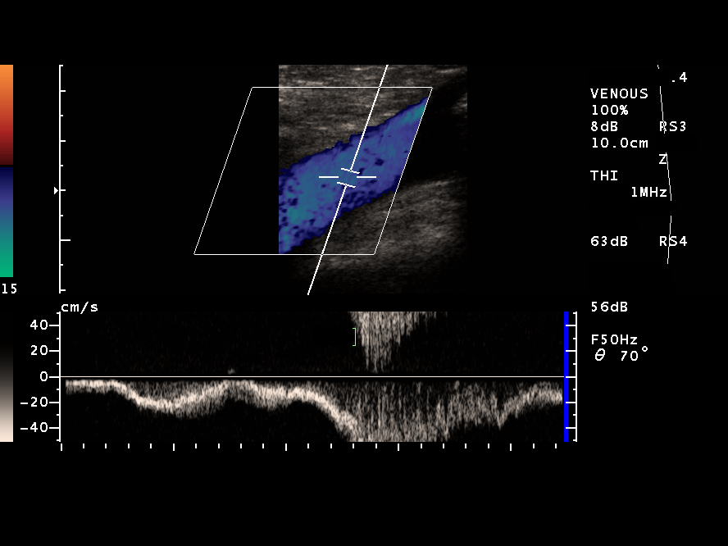
[im 22/29]
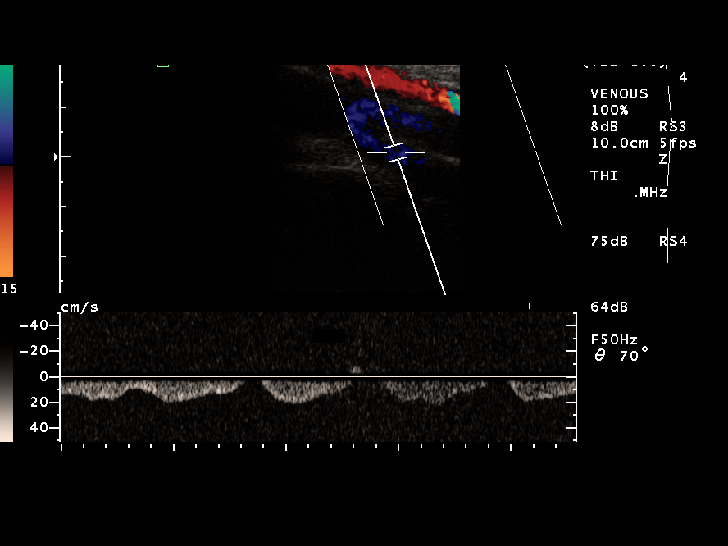
[im 24/29]
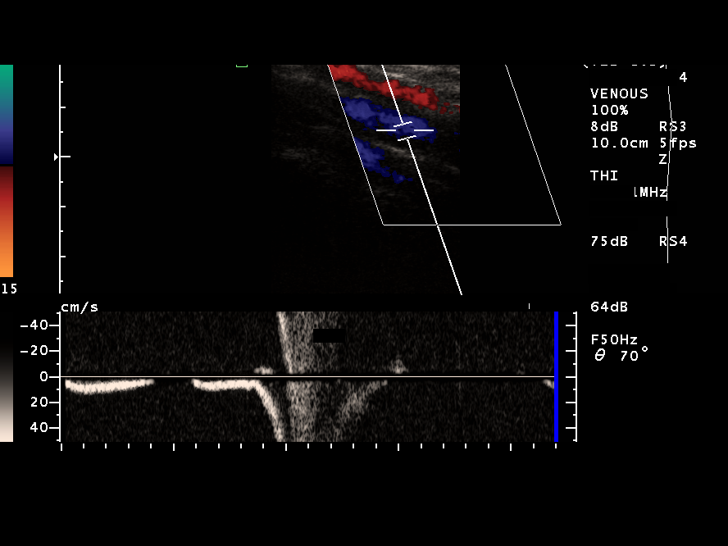
[im 26/29]
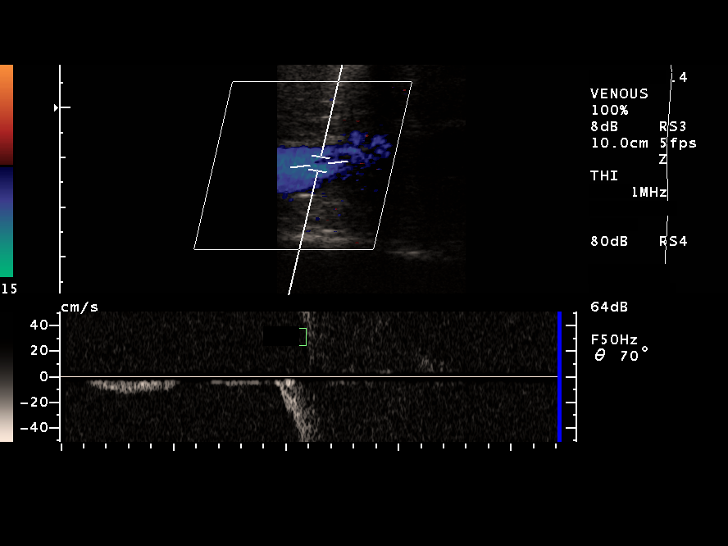
[im 29/29]
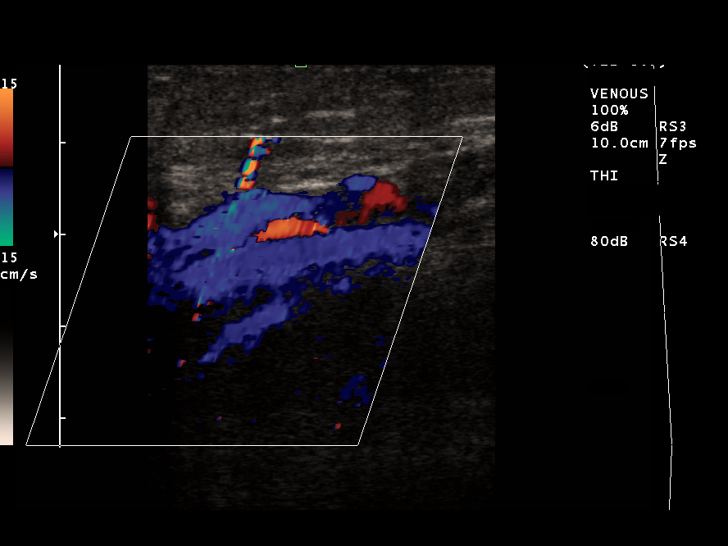

[14 of 24 positions shown; findings below may reference images not displayed]

FINDINGS: Complete compressibility is demonstrated throughout the visualized deep veins.  No venous filling defects are identified by gray-scale or color Doppler sonography.  Doppler waveforms show normal direction of venous flow, with normal phasicity and response to augmentation.
IMPRESSION: Negative.  No evidence of deep venous thrombosis.

## 2006-04-13 IMAGING — CT US EXTREM LOW VENOUS*L*
2 series · 10 of 14 positions shown, 11 images · non-contrast
Comparison: [DATE].

CLINICAL DATA: Left lower extremity pain and swelling. 
 LEFT LOWER EXTREMITY VENOUS DOPPLER ULTRASOUND:
TECHNIQUE: Gray-scale sonography with compression, as well as color and duplex Doppler ultrasound, were performed to evaluate the deep venous system from the level of the common femoral vein through the popliteal and proximal calf veins.

[Series 2: localize for pulmonary artery · axial · 0.70mm/px · z∈[-116,-71]mm · 4 of 15 slices shown, 5 images]
[im 3/15  soft-tissue]
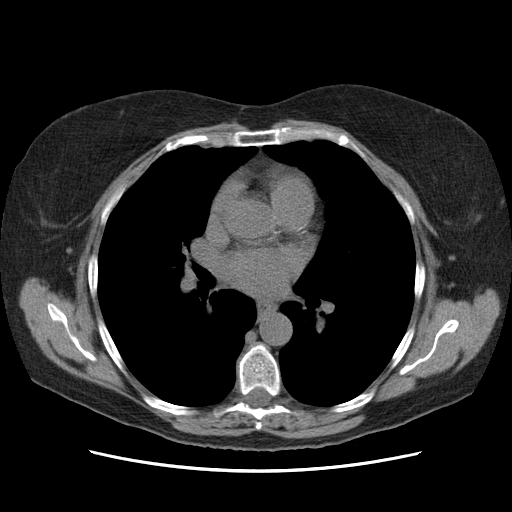
[im 3/15  bone]
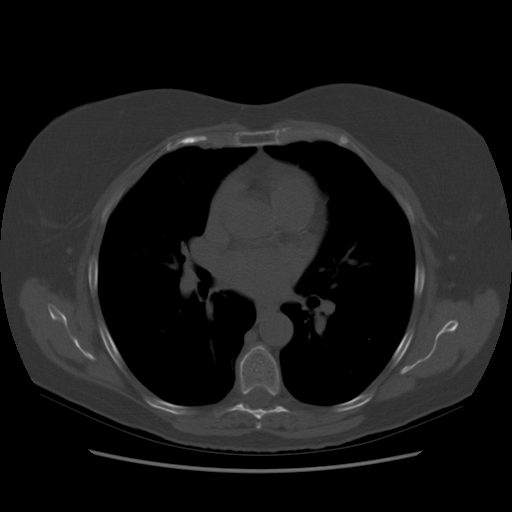
[im 6/15  bone]
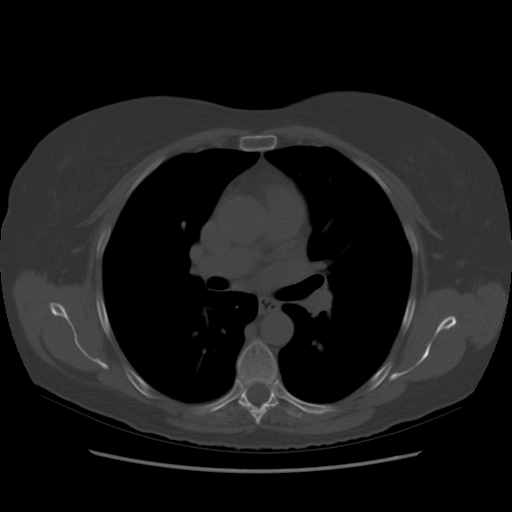
[im 9/15  bone]
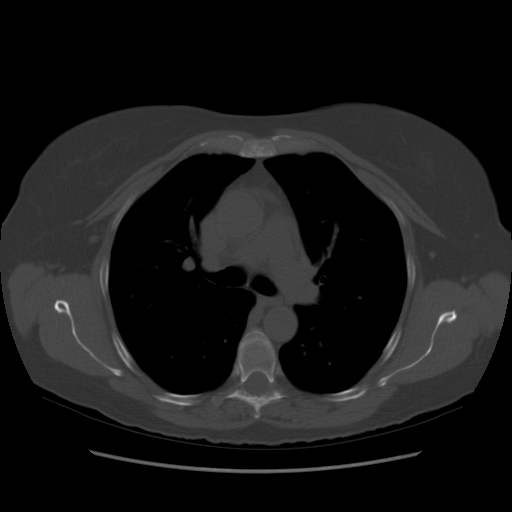
[im 12/15  bone]
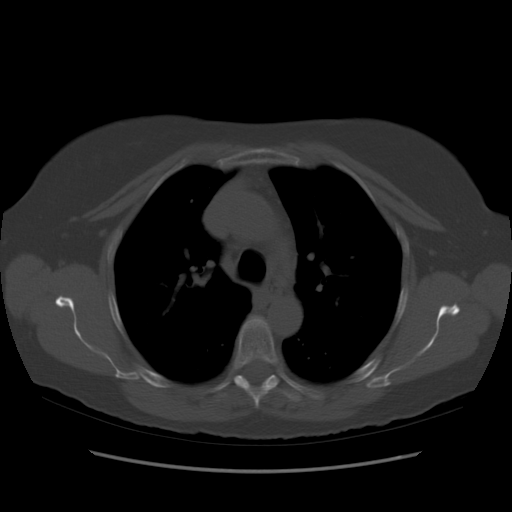

[Series 3: miroi · axial · 0.70mm/px · z∈[-96,-96]mm · 6 of 20 slices shown]
[im 3/20  bone]
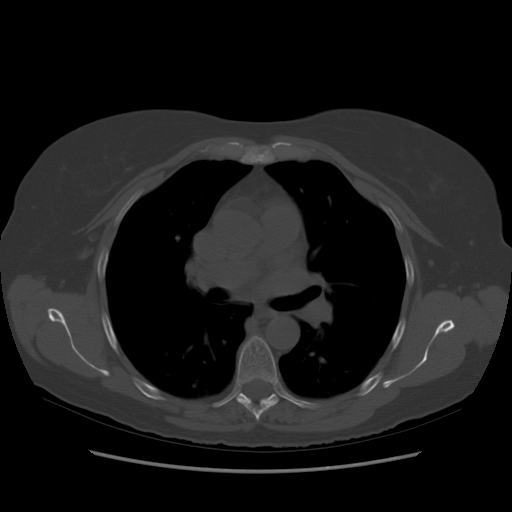
[im 6/20  bone]
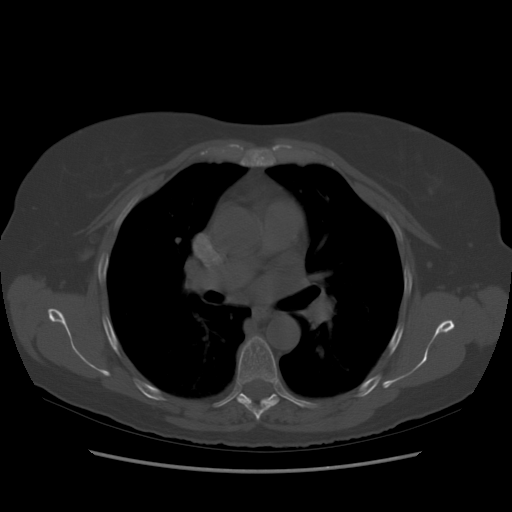
[im 9/20  bone]
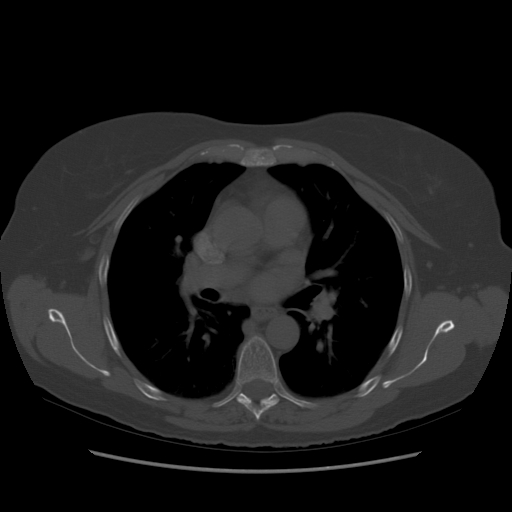
[im 11/20  bone]
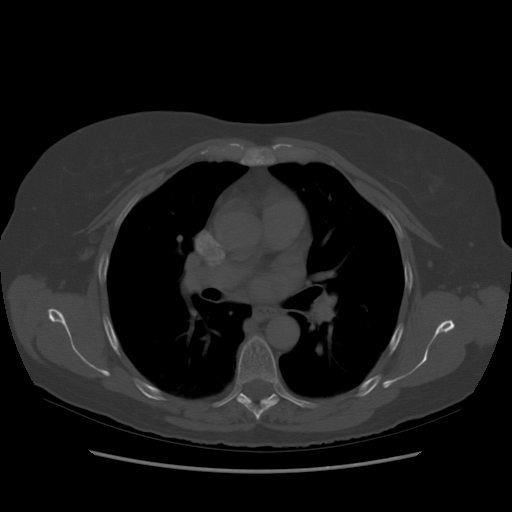
[im 14/20  bone]
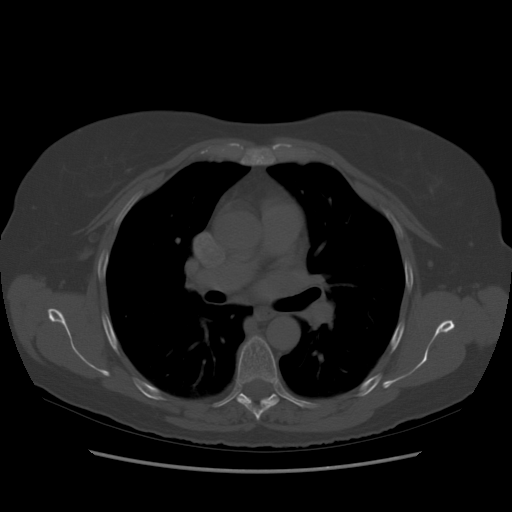
[im 17/20  bone]
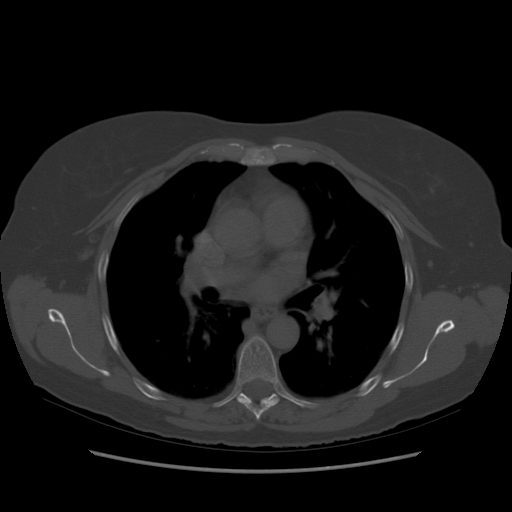

[10 of 14 positions shown; findings below may reference images not displayed]

FINDINGS: Complete compressibility is demonstrated throughout the visualized deep veins.  No venous filling defects are identified by gray-scale or color Doppler sonography.  Doppler waveforms show normal direction of venous flow, with normal phasicity and response to augmentation.
IMPRESSION: Negative.  No evidence of deep venous thrombosis.

## 2006-07-22 ENCOUNTER — Emergency Department (HOSPITAL_COMMUNITY): Admission: AD | Admit: 2006-07-22 | Discharge: 2006-07-22 | Payer: Self-pay | Admitting: Family Medicine

## 2006-08-02 ENCOUNTER — Other Ambulatory Visit: Admission: RE | Admit: 2006-08-02 | Discharge: 2006-08-02 | Payer: Self-pay | Admitting: Obstetrics & Gynecology

## 2006-08-20 ENCOUNTER — Encounter: Admission: RE | Admit: 2006-08-20 | Discharge: 2006-08-20 | Payer: Self-pay | Admitting: Internal Medicine

## 2006-08-20 IMAGING — CR DG CERVICAL SPINE COMPLETE 4+V
5 series · 5 of 5 positions shown · non-contrast
Comparison: None.

CLINICAL DATA: Left sided neck pain.  No prior injury. 
 CERVICAL SPINE - 5 VIEW:

[w c-spine lat]
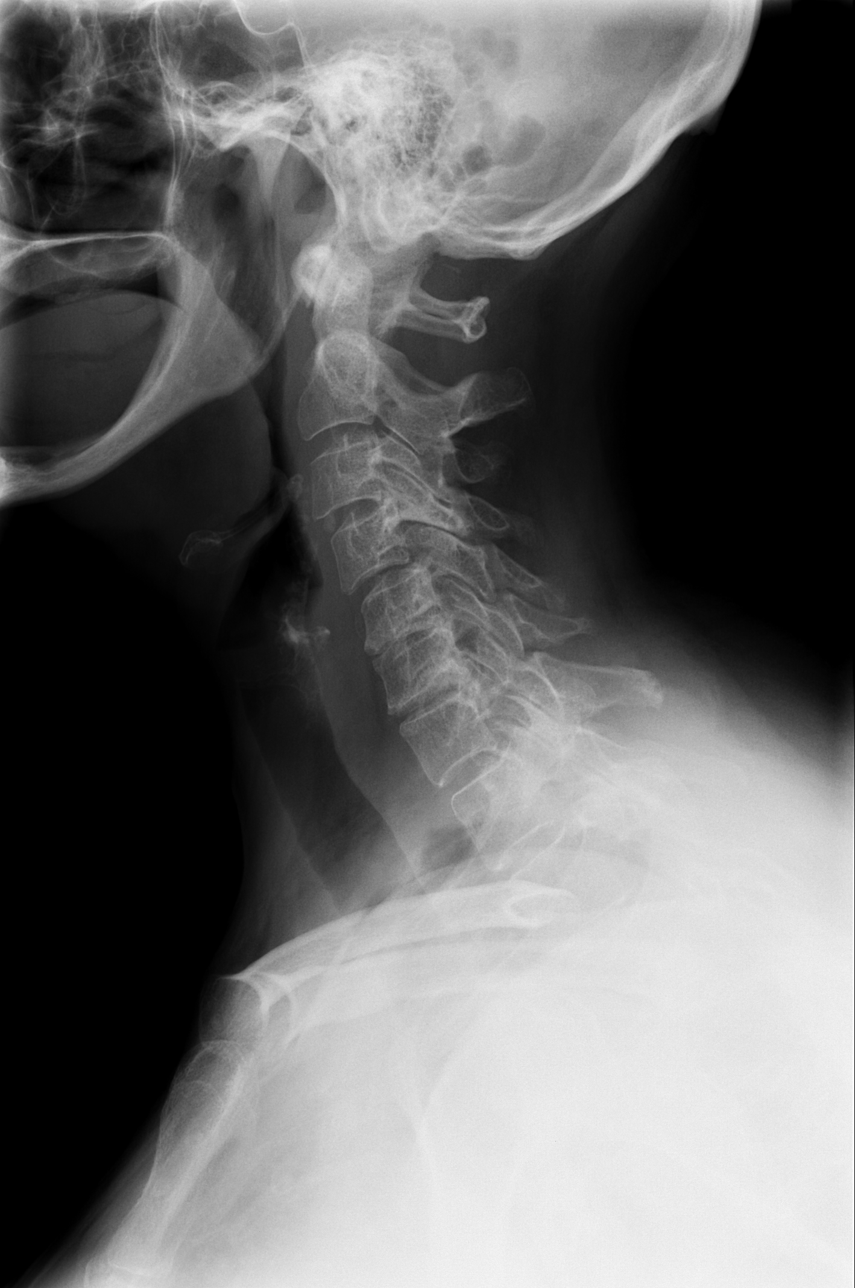

[w c-spine oblique (1 of 2)]
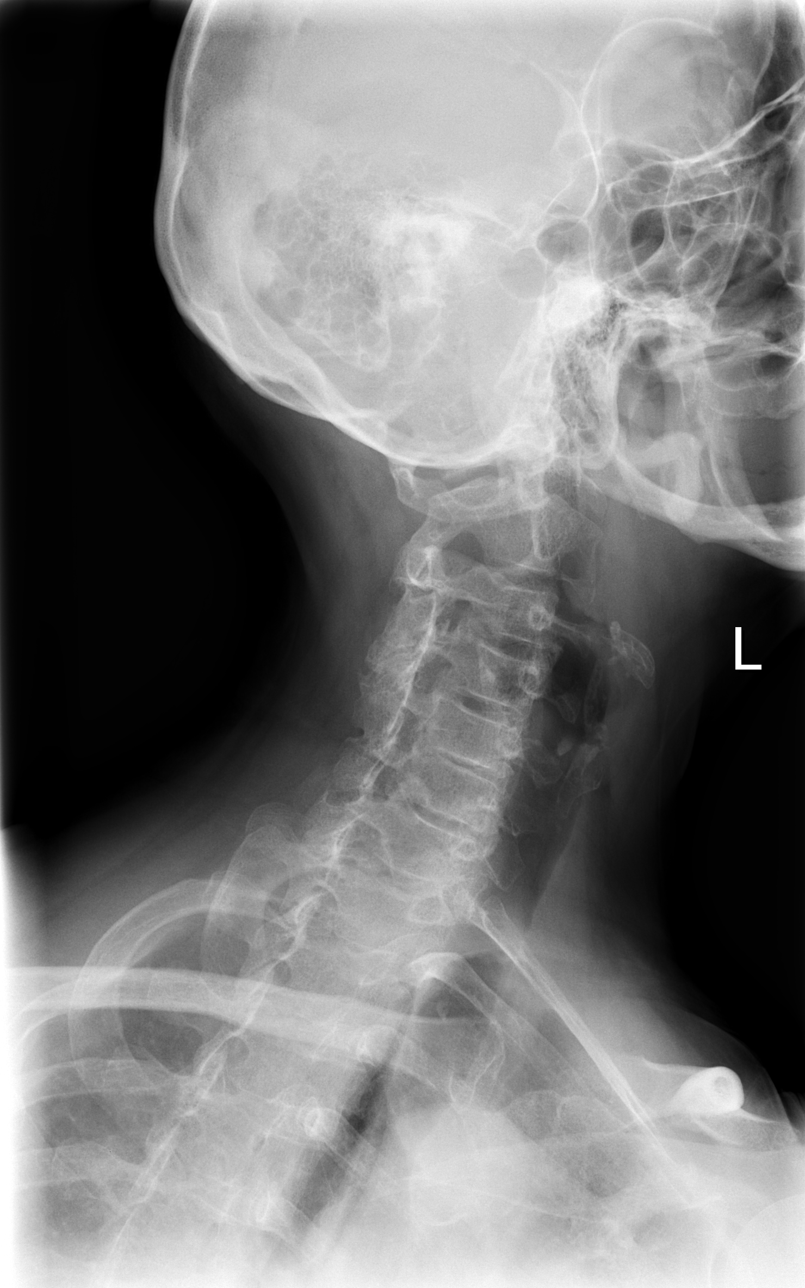

[w c-spine oblique (2 of 2)]
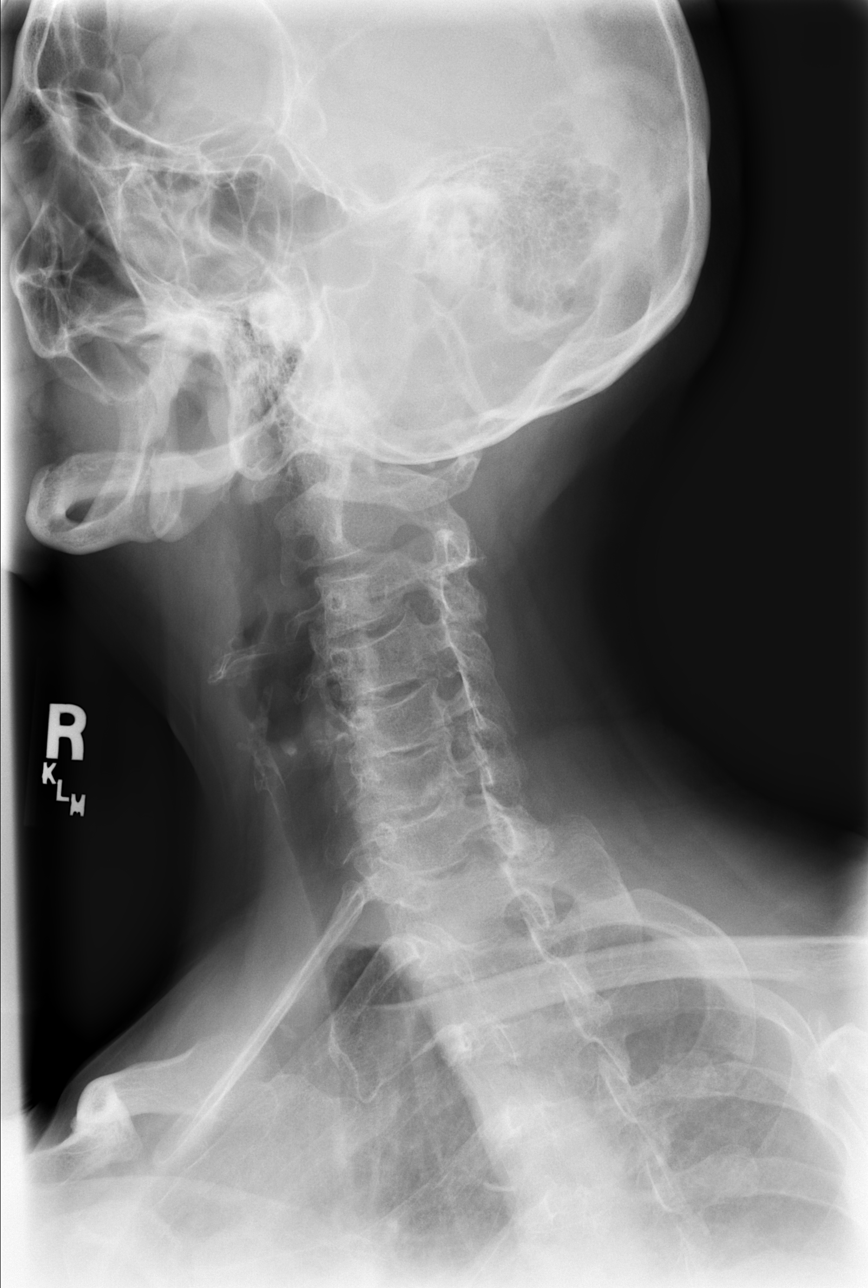

[w c-spine a.p. *]
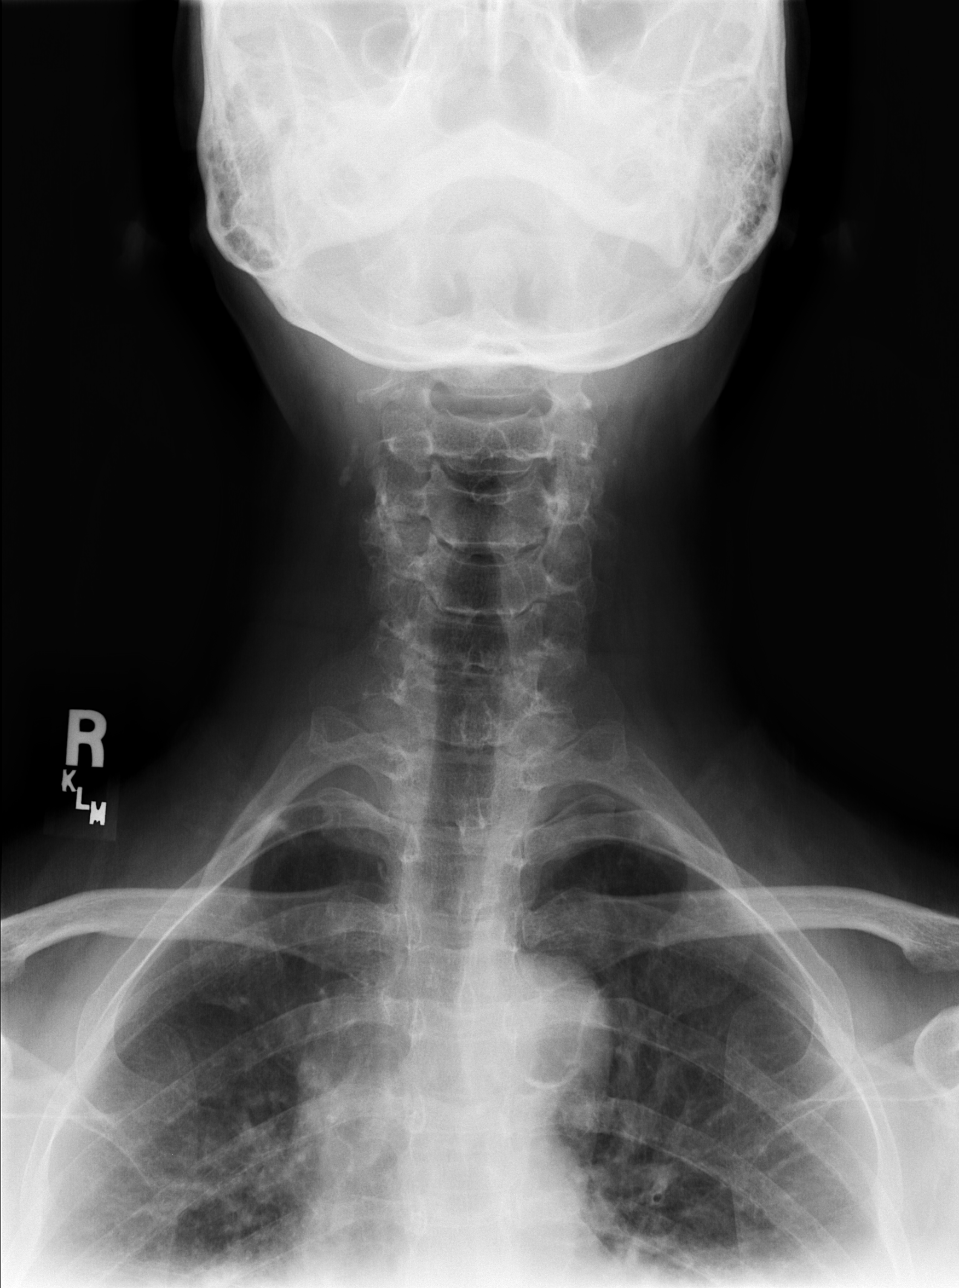

[w c-spine odontoid *]
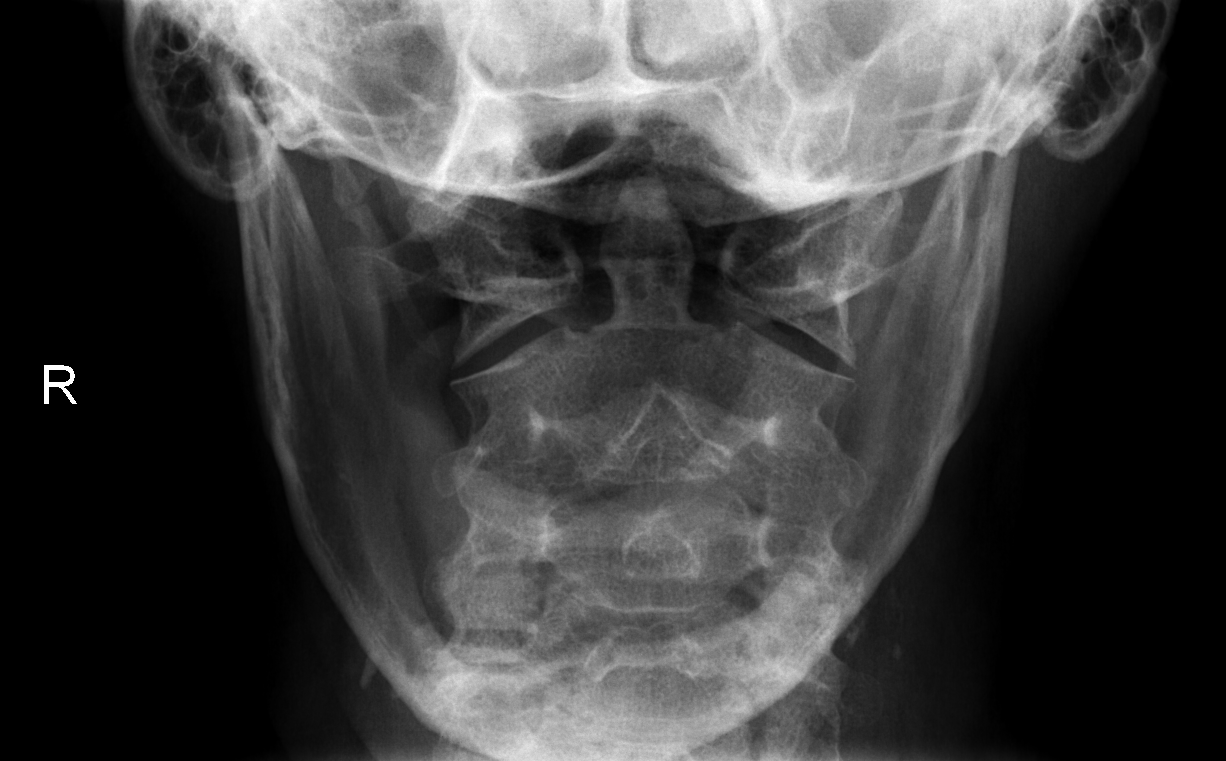

[5 of 5 positions shown; findings below may reference images not displayed]

FINDINGS: Lateral view images through the bottom of T1.  Prevertebral soft tissues are within normal limits.  There is moderate C5-C6 and C6-C7, and mild 
 C4-5 spondylosis.  Trace anterolisthesis of C4 on C5.  Maintenance of vertebral body height.  Left sided neural foraminal narrowing at C4-5 through C6-7.  Probable right sided neural foraminal narrowing as well at these levels although the oblique view is somewhat suboptimal.  The lateral masses are symmetric.  The odontoid process is intact.
IMPRESSION: Mid cervical spondylosis with trace C4-C5 anterolisthesis.

## 2006-11-29 ENCOUNTER — Emergency Department (HOSPITAL_COMMUNITY): Admission: EM | Admit: 2006-11-29 | Discharge: 2006-11-30 | Payer: Self-pay | Admitting: Emergency Medicine

## 2006-11-29 IMAGING — CR DG CHEST 2V
1 series · 1 of 1 positions shown · non-contrast
Comparison: [DATE]

CLINICAL DATA: Chest pain, asthma.    
 CHEST - 2 VIEW:

[w chest pa]
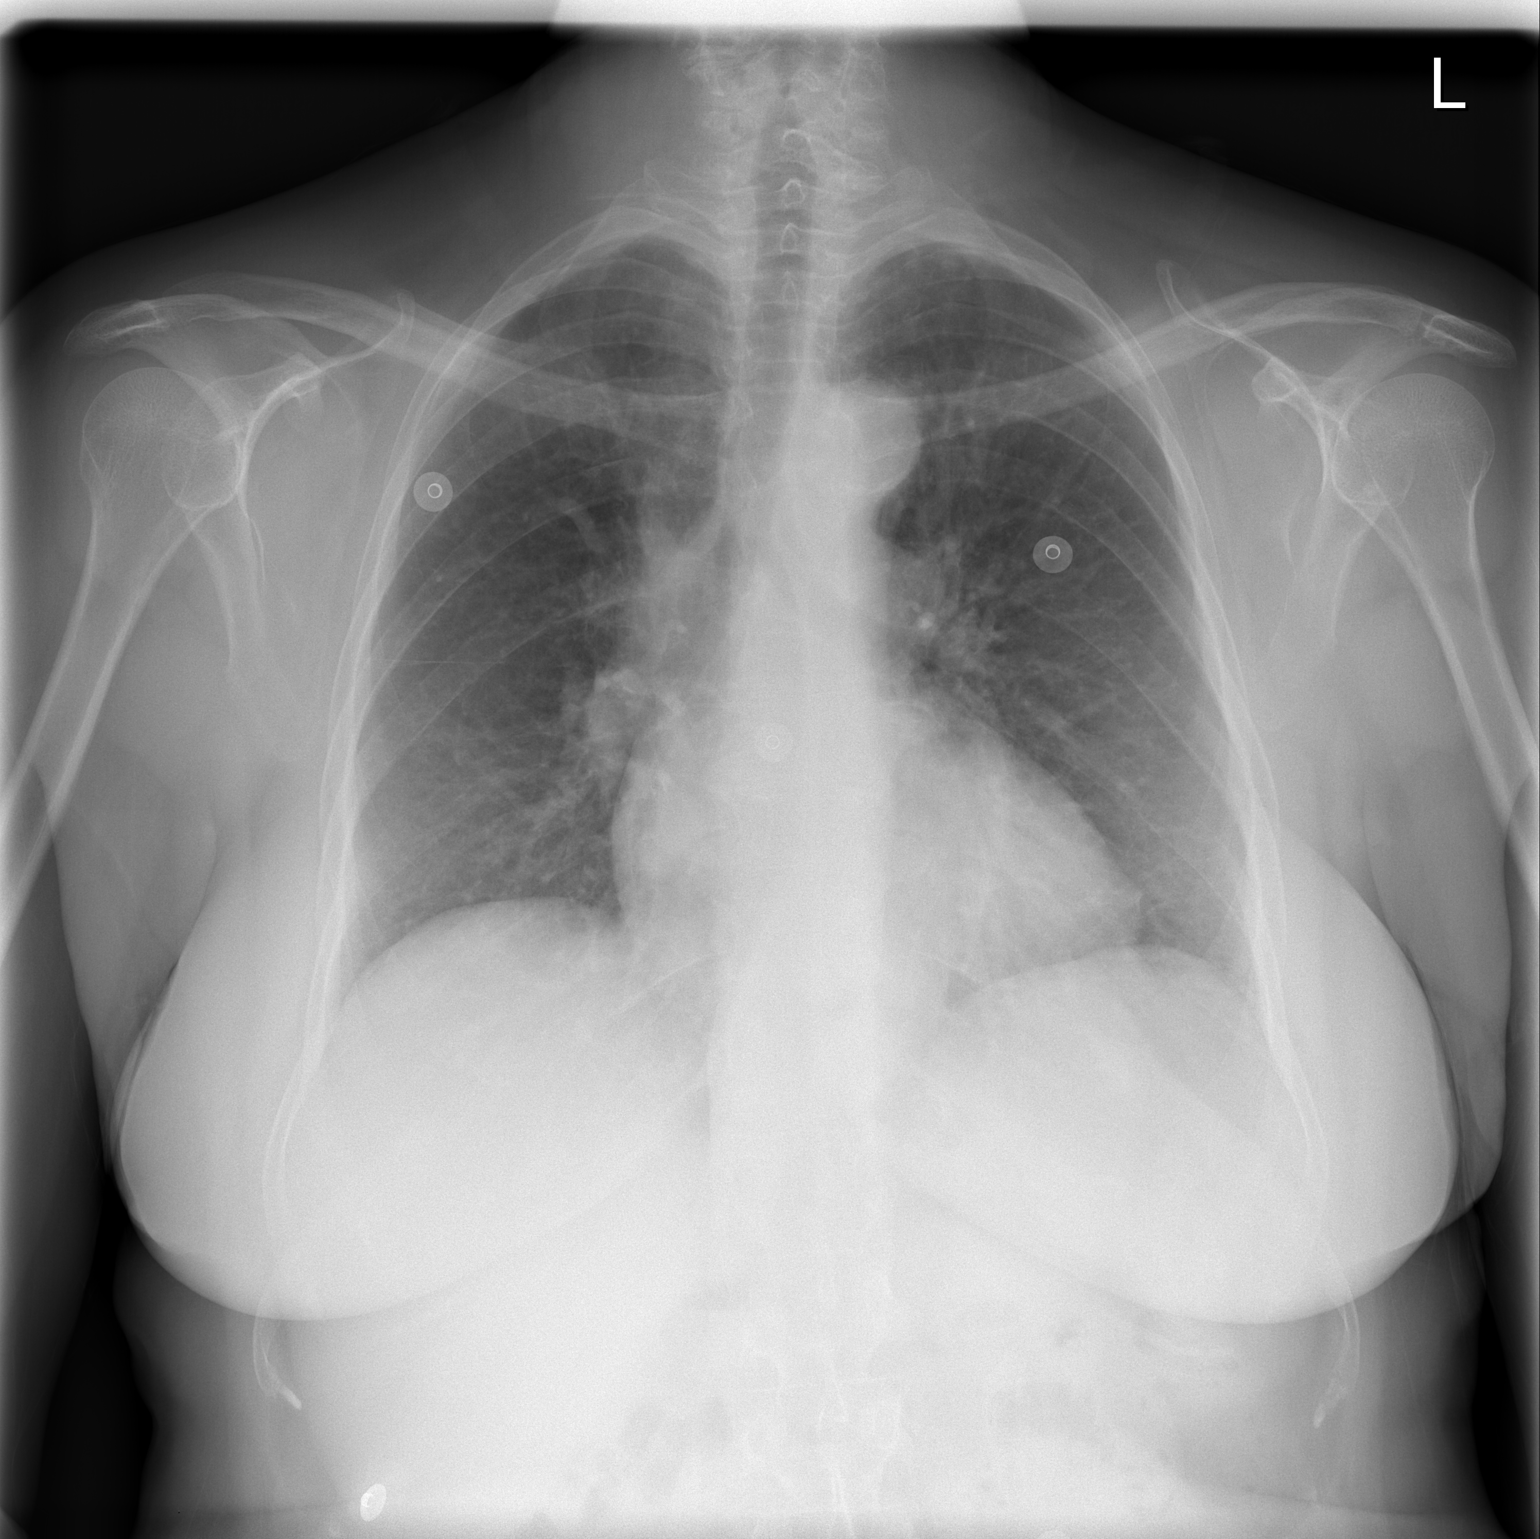

[1 of 1 positions shown; findings below may reference images not displayed]

FINDINGS: The lungs are clear.  There is mild cardiomegaly. Mild central airway thickening is noted.  Bones are osteopenic.
IMPRESSION: No acute finding.  Stable exam.

## 2007-03-26 ENCOUNTER — Encounter: Admission: RE | Admit: 2007-03-26 | Discharge: 2007-03-26 | Payer: Self-pay | Admitting: Internal Medicine

## 2007-03-26 IMAGING — MG MM SCREEN MAMMOGRAM BILATERAL
4 series · 4 of 4 positions shown · non-contrast
Comparison: none

DG SCREEN MAMMOGRAM BILATERAL
Bilateral CC and MLO view(s) were taken.

DIGITAL SCREENING MAMMOGRAM WITH CAD:
There are scattered fibroglandular densities.  No masses or malignant type calcifications are 
identified.  Compared with prior studies.

[R CC]
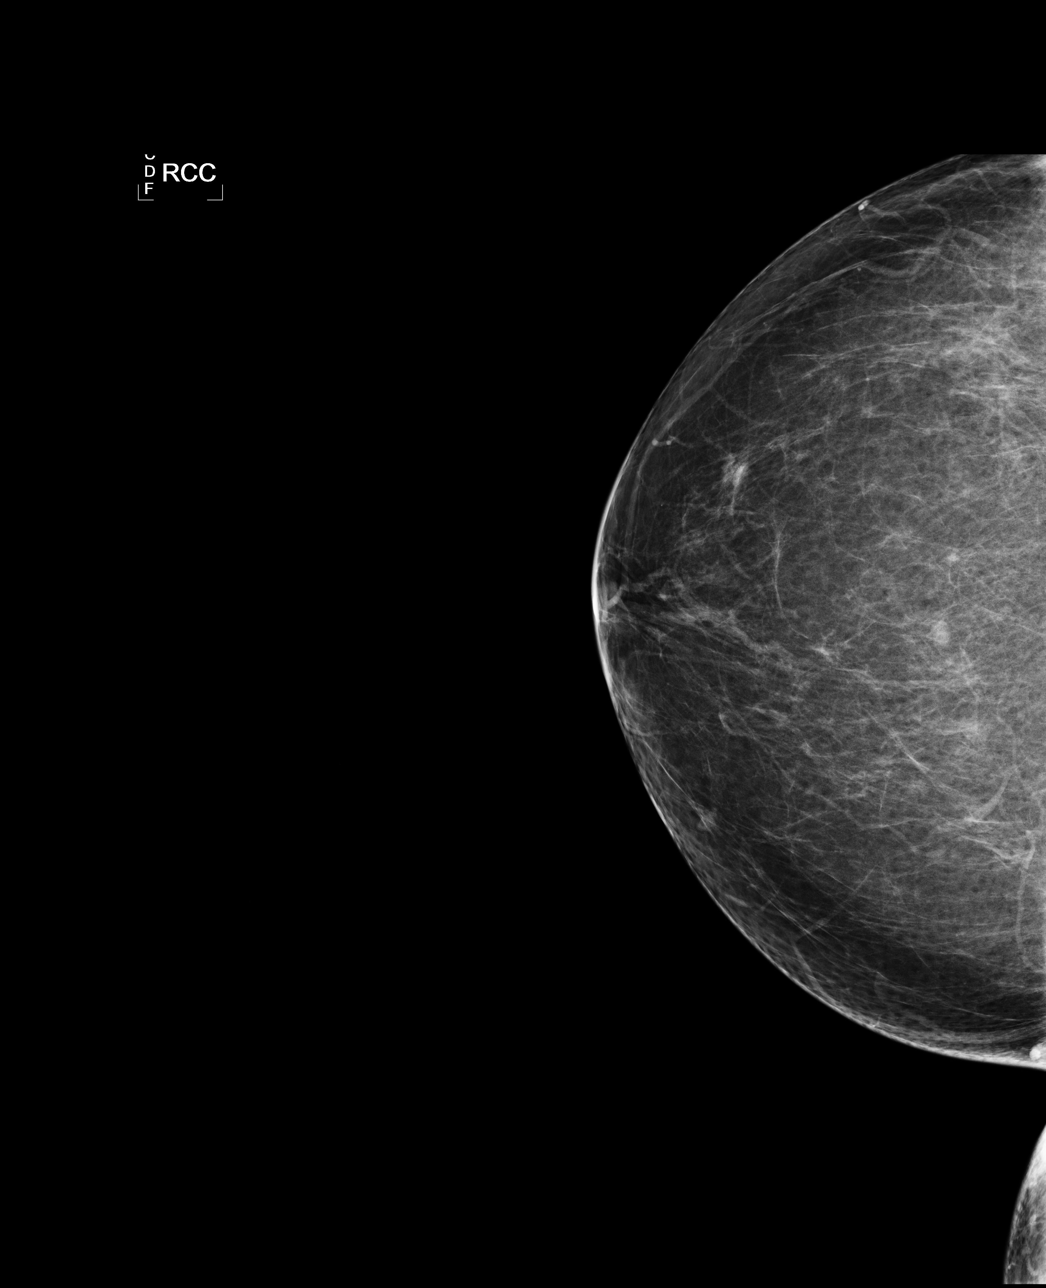

[L CC]
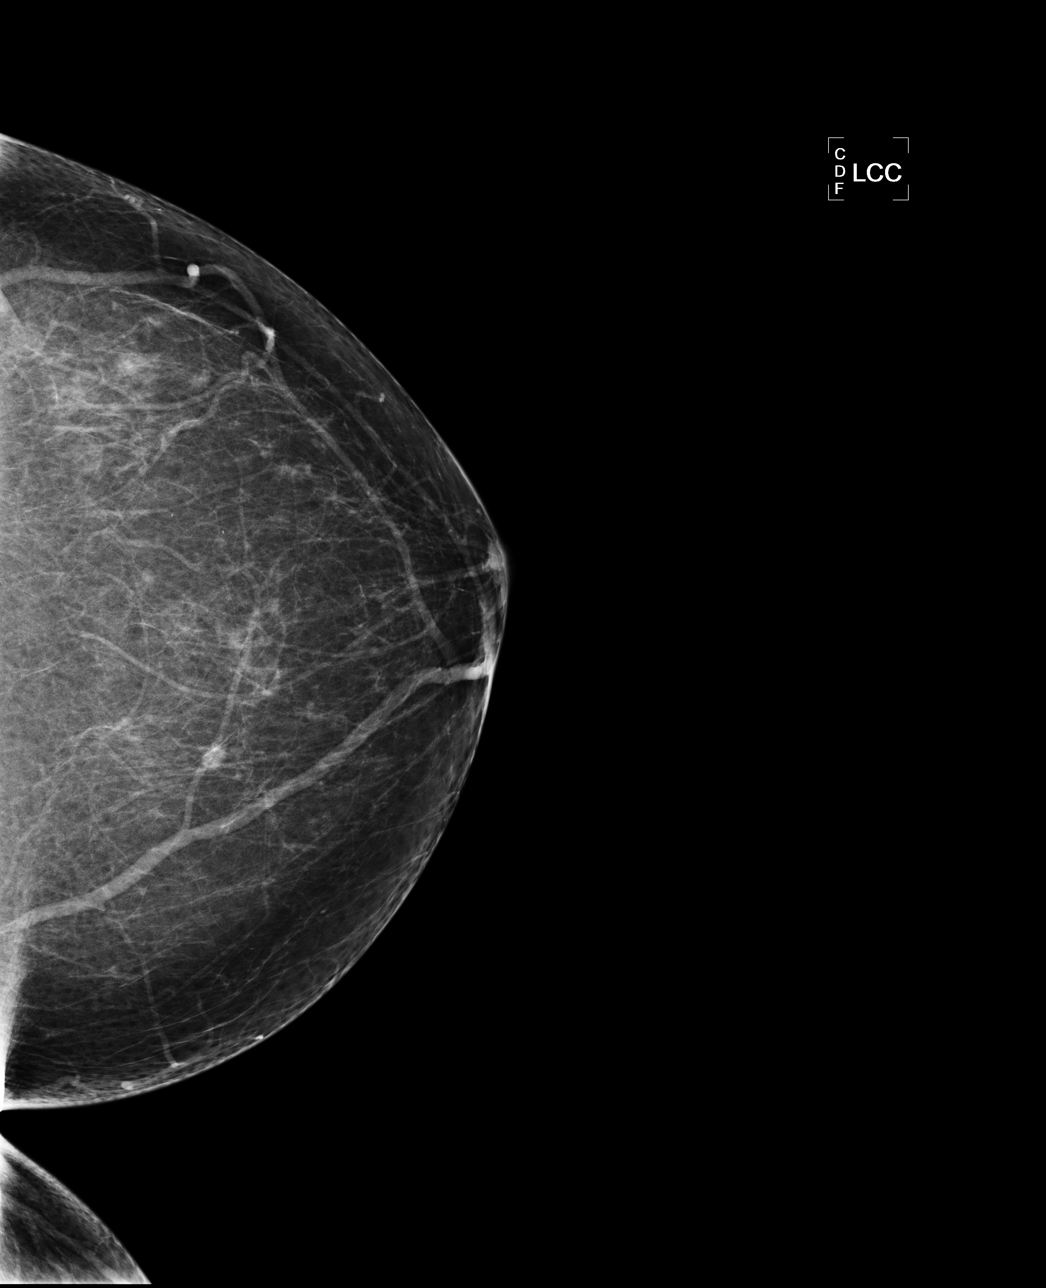

[L MLO]
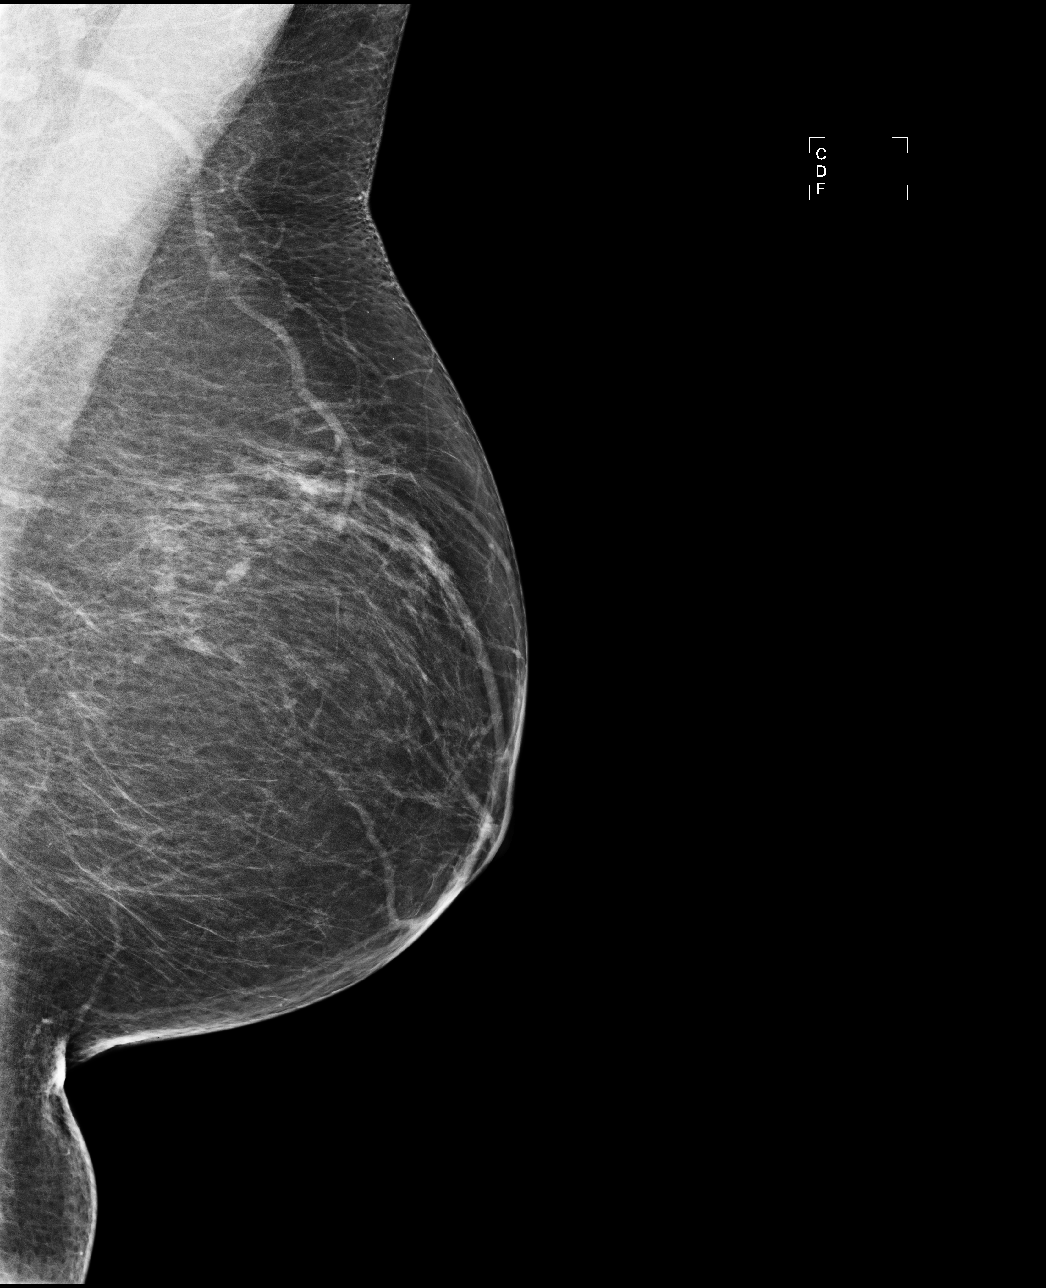

[R MLO]
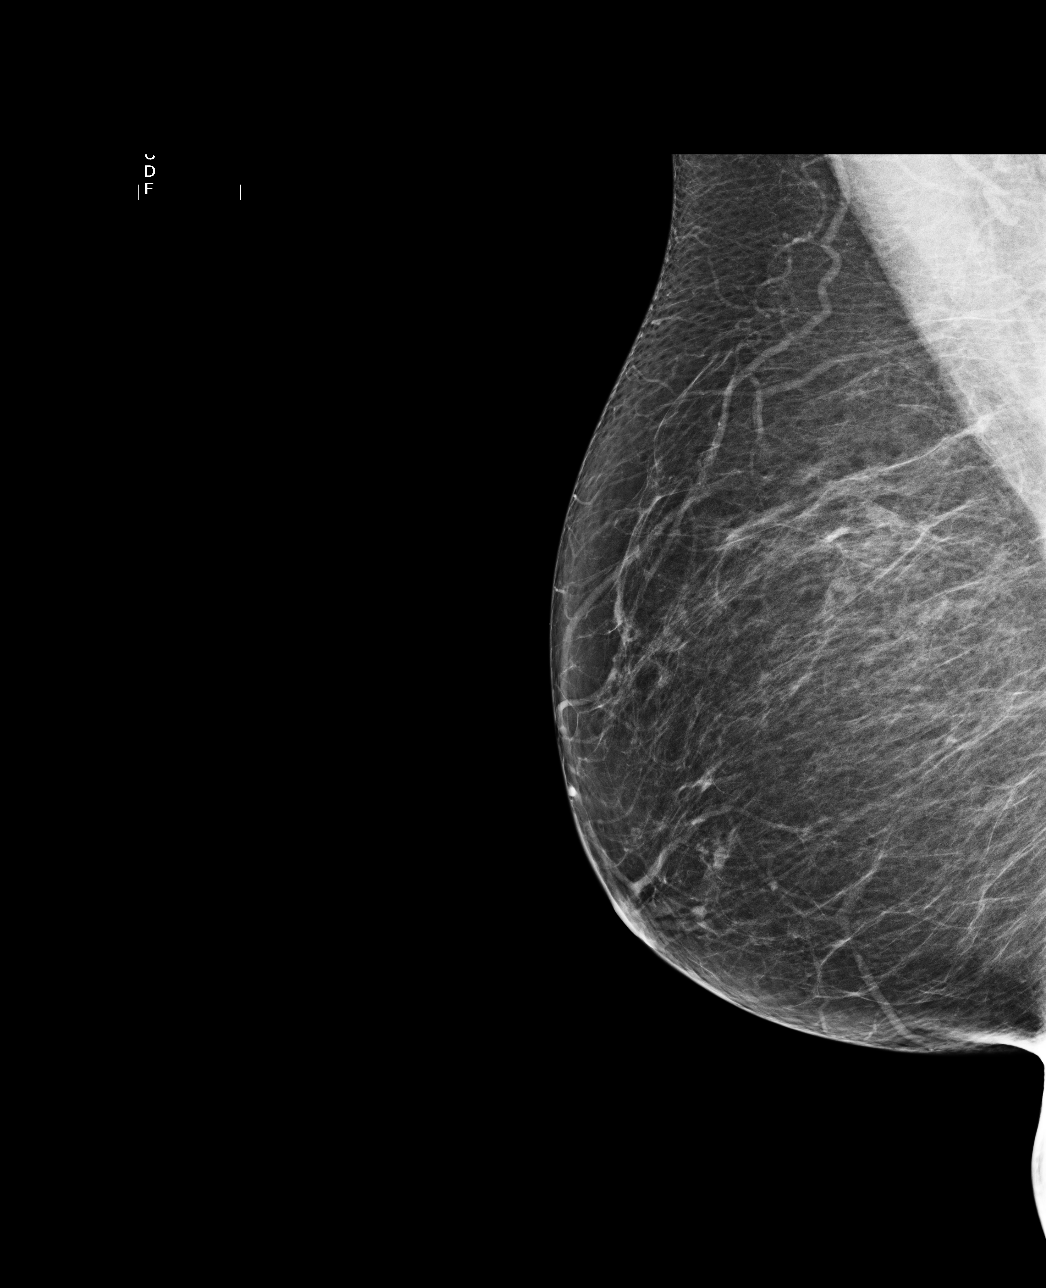

[4 of 4 positions shown; findings below may reference images not displayed]

IMPRESSION: No specific mammographic evidence of malignancy.  Next screening mammogram is recommended in one 
year.

ASSESSMENT: Negative - BI-RADS 1

Screening mammogram in 1 year.
ANALYZED BY COMPUTER AIDED DETECTION. , THIS PROCEDURE WAS A DIGITAL MAMMOGRAM.

## 2007-03-27 ENCOUNTER — Emergency Department (HOSPITAL_COMMUNITY): Admission: EM | Admit: 2007-03-27 | Discharge: 2007-03-27 | Payer: Self-pay | Admitting: Emergency Medicine
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
## 2007-08-10 ENCOUNTER — Emergency Department (HOSPITAL_COMMUNITY): Admission: EM | Admit: 2007-08-10 | Discharge: 2007-08-10 | Payer: Self-pay | Admitting: Emergency Medicine

## 2007-08-10 IMAGING — CR DG LUMBAR SPINE COMPLETE 4+V
5 series · 5 of 5 positions shown · non-contrast
Comparison: [DATE]

CLINICAL DATA: Right hip pain. 
 LUMBAR SPINE ? 5 VIEW:

[t l-spine a.p.]
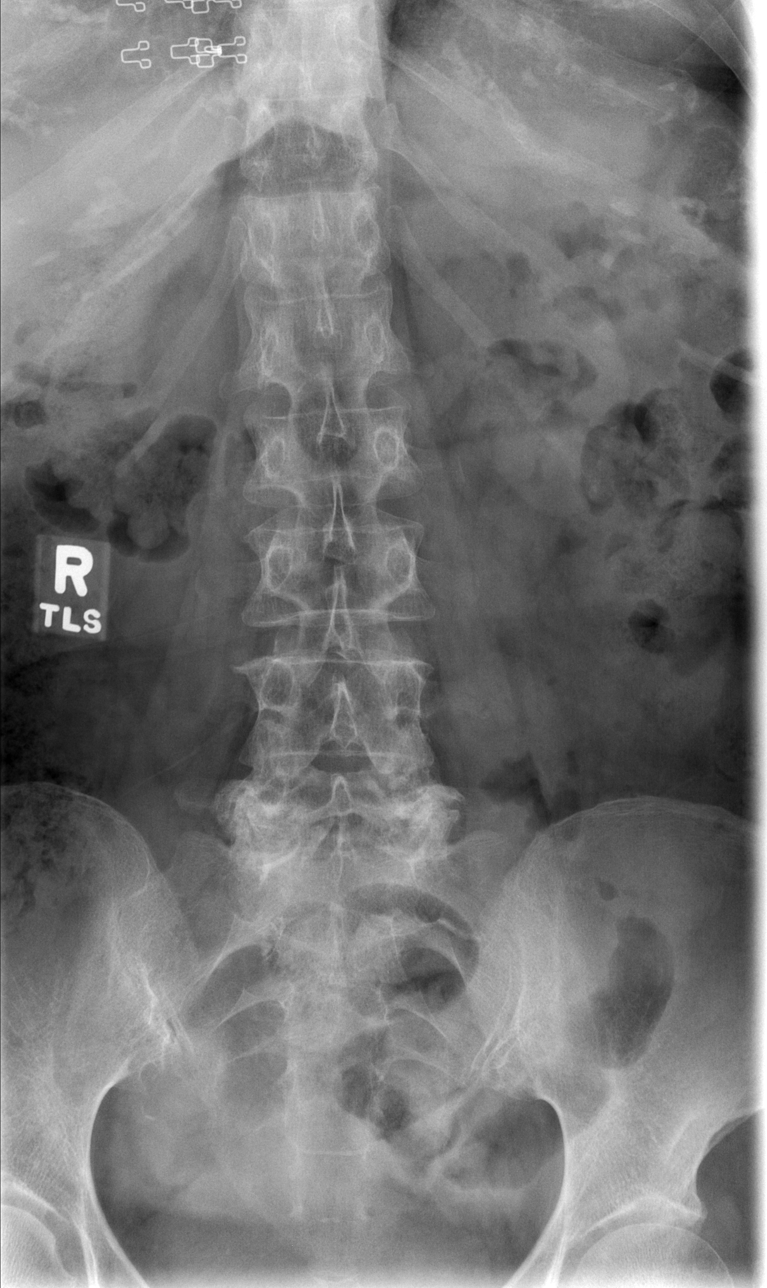

[t l-spine oblique exposure (1 of 2)]
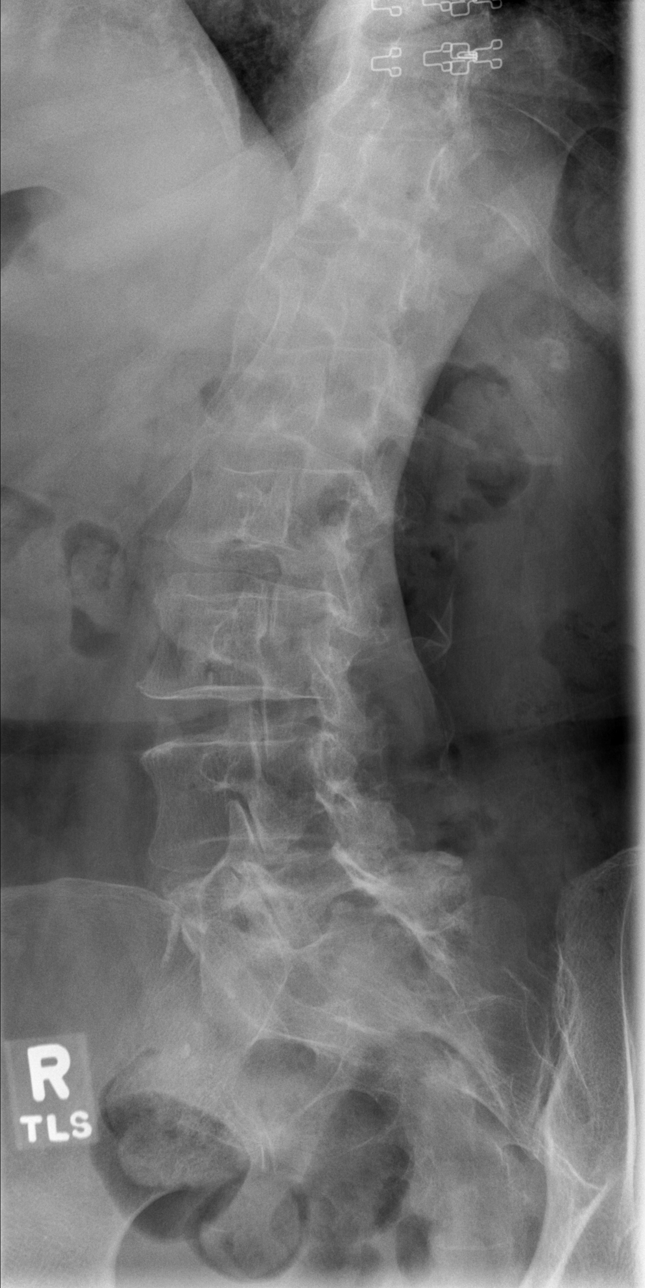

[t l-spine oblique exposure (2 of 2)]
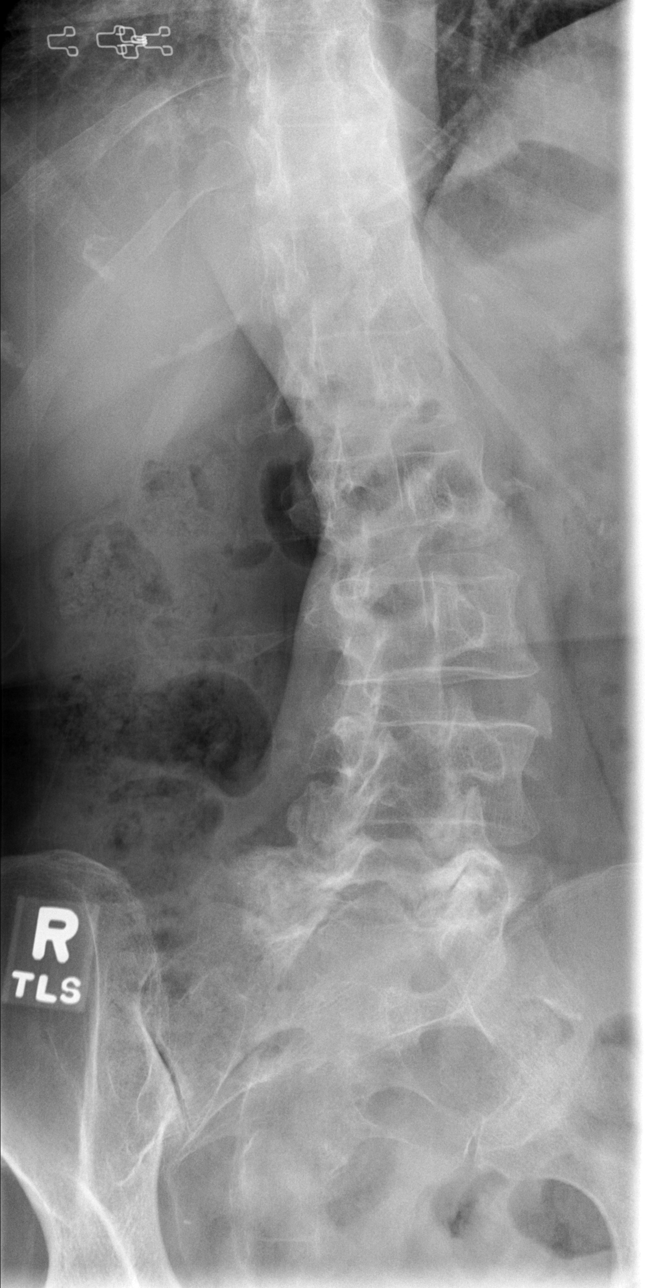

[t l-spine lat]
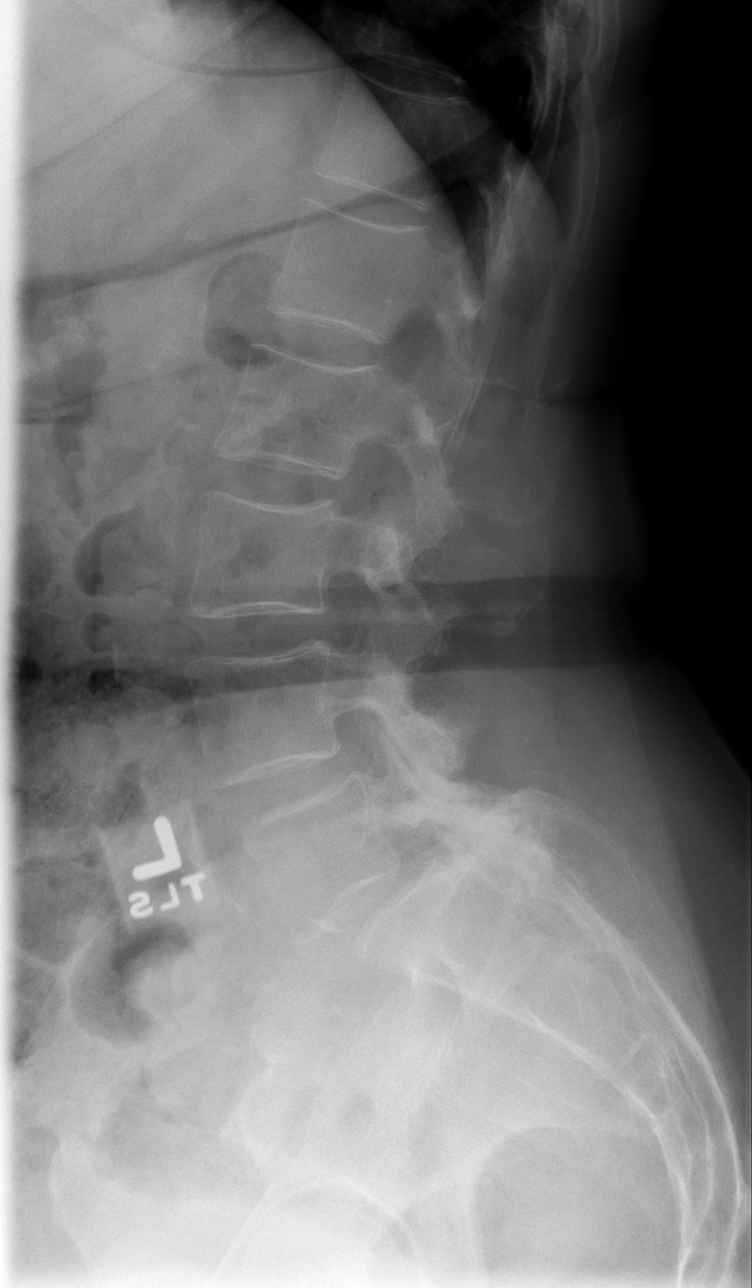

[t l-spine l5-s1 spot]
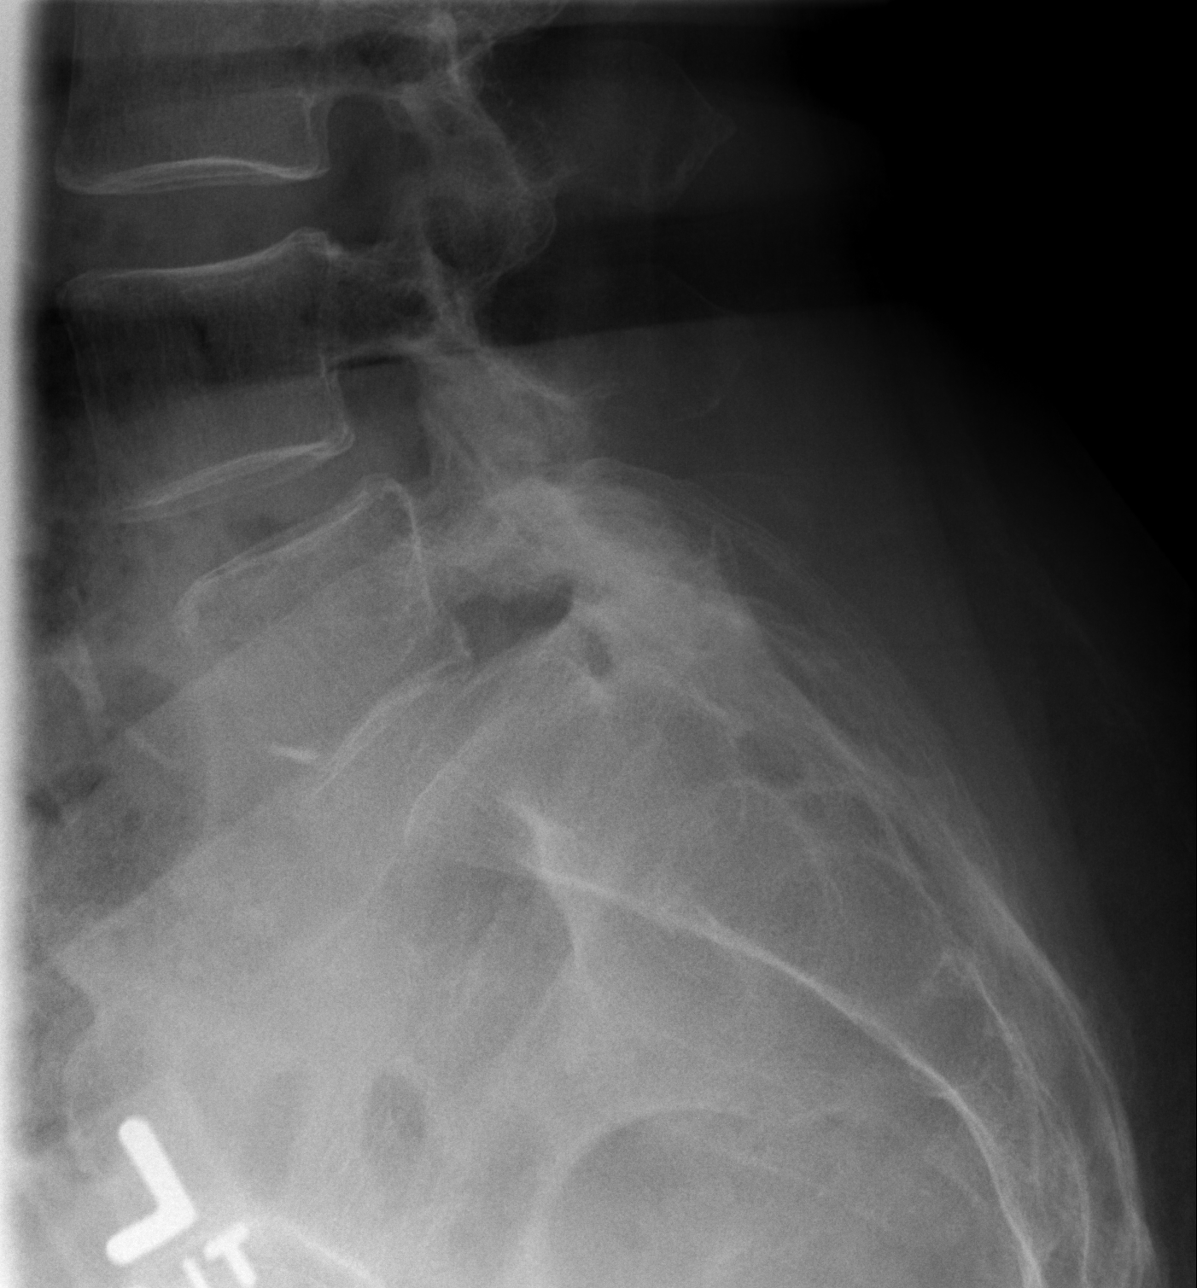

[5 of 5 positions shown; findings below may reference images not displayed]

FINDINGS: Five lumbar type vertebral bodies are identified.  Severe facet arthrosis is present at L5-S1.  There is also a grade I anterolisthesis at this level; however, there do not appear to be L5 pars defects.  I suspect this is secondary to severe facet arthrosis, allowing the anterior slip.  Degenerative disease is present at other levels, with small osteophytes.  Abdominal aortic atherosclerosis.   Vertebral body height is preserved.
IMPRESSION: Lower lumbar and lumbosacral degenerative disease, with severe facet arthrosis at L5-S1, and a grade I anterolisthesis, likely secondary to degenerative disease at the 5-1 junction.

## 2007-10-28 ENCOUNTER — Encounter: Admission: RE | Admit: 2007-10-28 | Discharge: 2007-10-28 | Payer: Self-pay | Admitting: Internal Medicine

## 2007-10-28 IMAGING — CT CT HEAD W/O CM
1 series · 16 of 28 positions shown, 20 images · IV contrast (agent unspecified)
Comparison: None.

CLINICAL DATA: Headaches.
 HEAD CT WITHOUT CONTRAST:
TECHNIQUE: Contiguous axial images were obtained from the base of the skull through the vertex according to standard protocol without contrast.

[Series 2: head · axial · 0.49mm/px · z∈[+41,+174]mm · 16 of 28 slices shown, 20 images]
[im 2/28  brain]
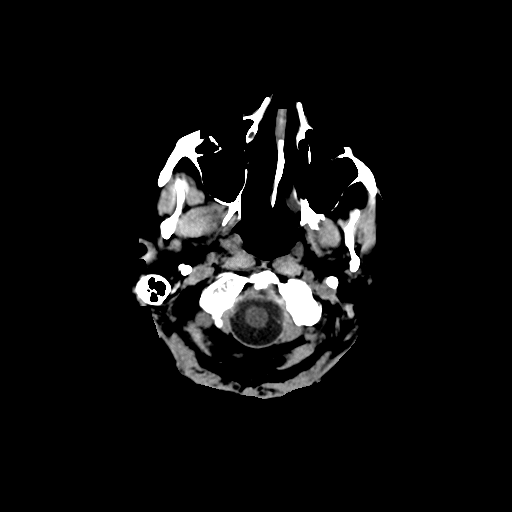
[im 2/28  bone]
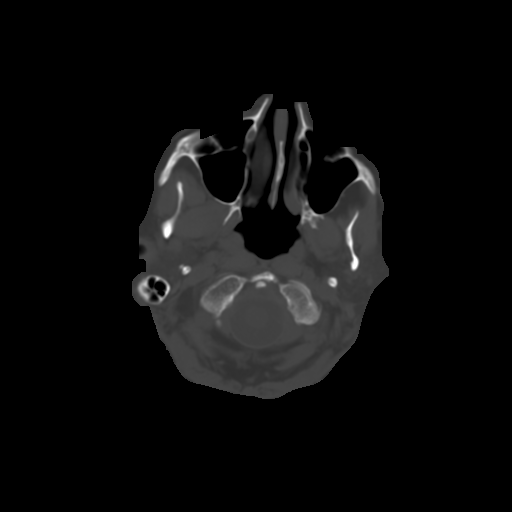
[im 4/28  brain]
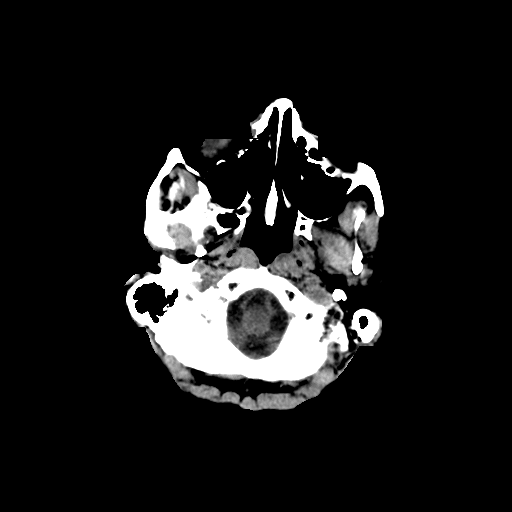
[im 6/28  brain]
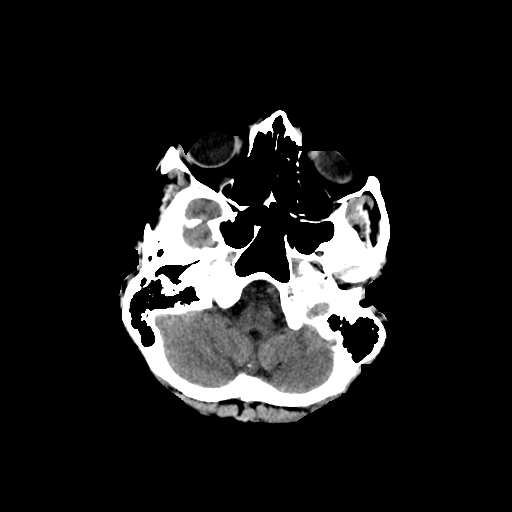
[im 7/28  brain]
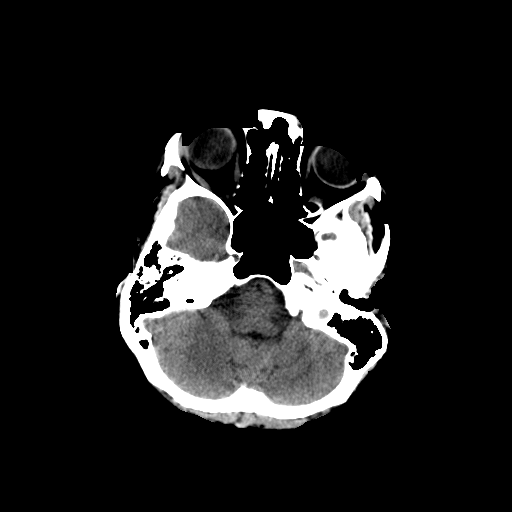
[im 9/28  brain]
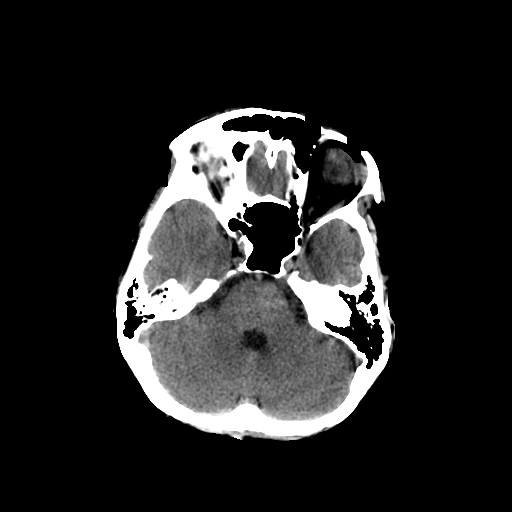
[im 9/28  bone]
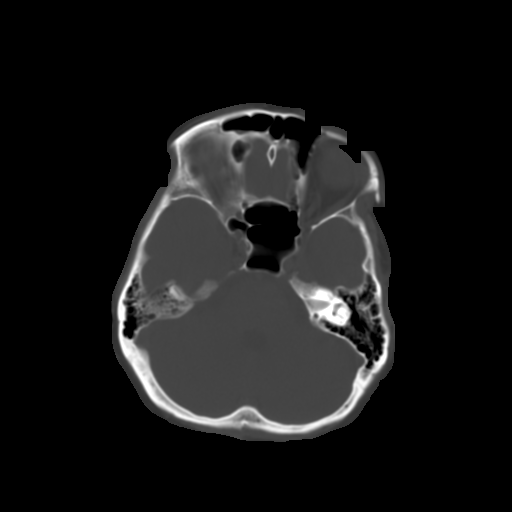
[im 10/28  brain]
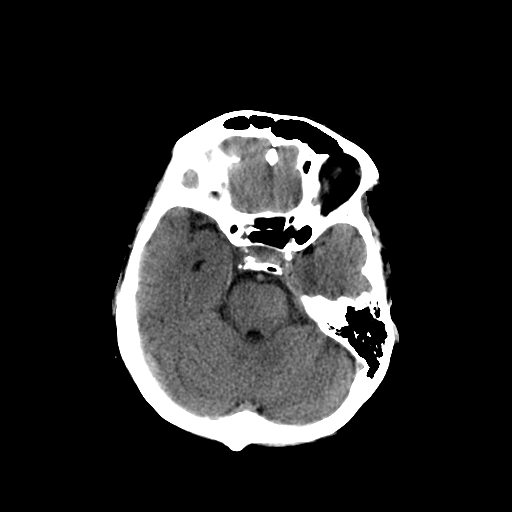
[im 12/28  brain]
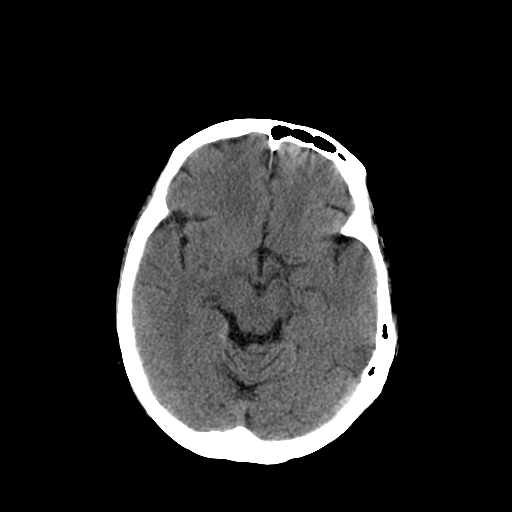
[im 14/28  brain]
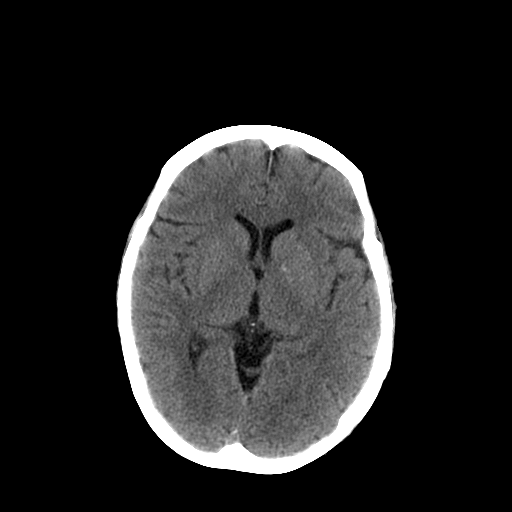
[im 15/28  brain]
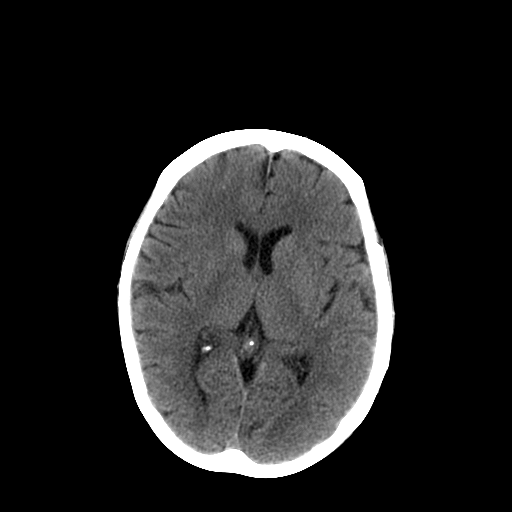
[im 15/28  bone]
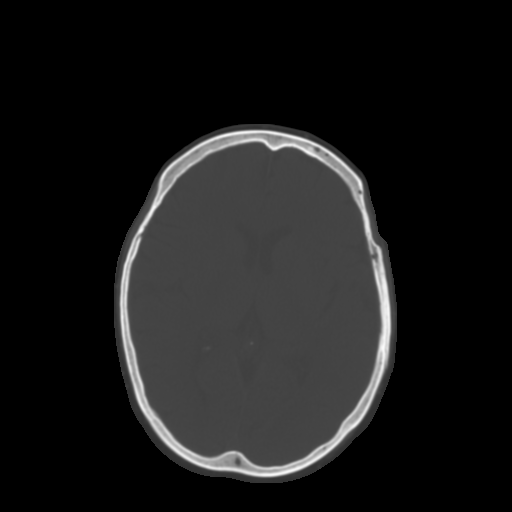
[im 17/28  brain]
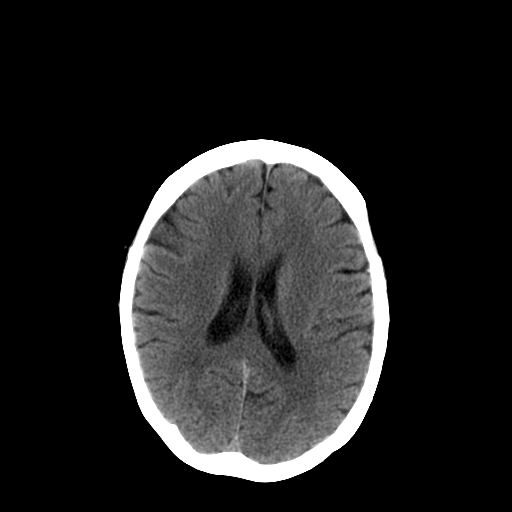
[im 19/28  brain]
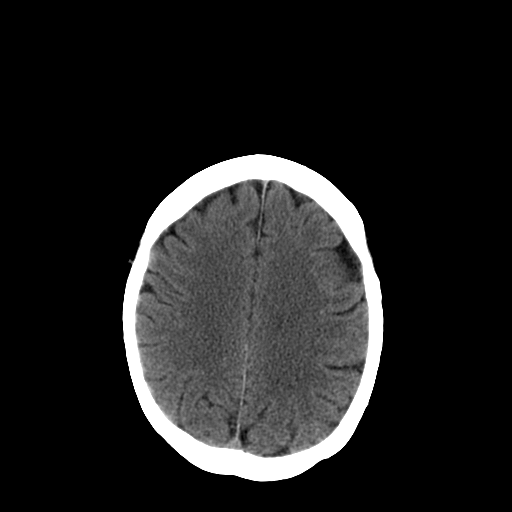
[im 20/28  brain]
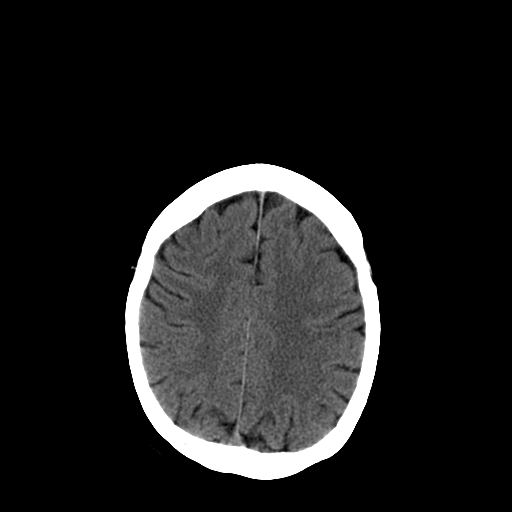
[im 22/28  brain]
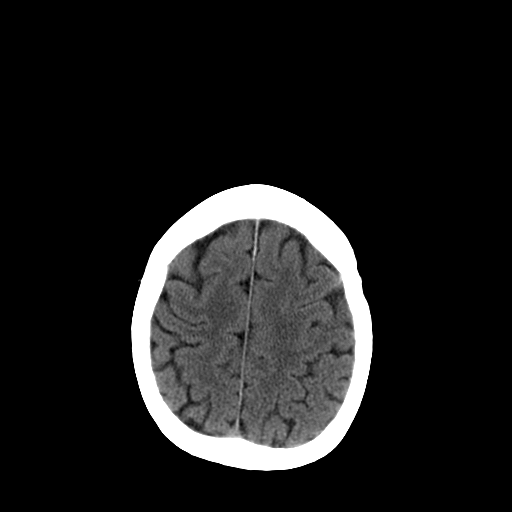
[im 22/28  bone]
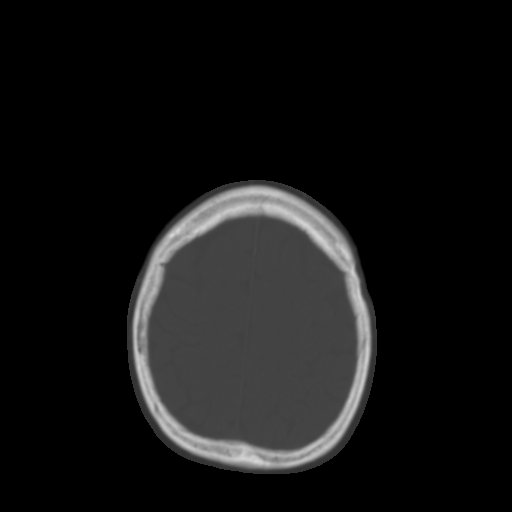
[im 23/28  brain]
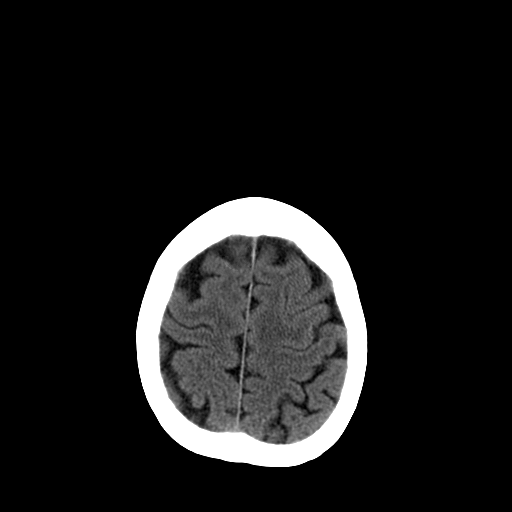
[im 25/28  brain]
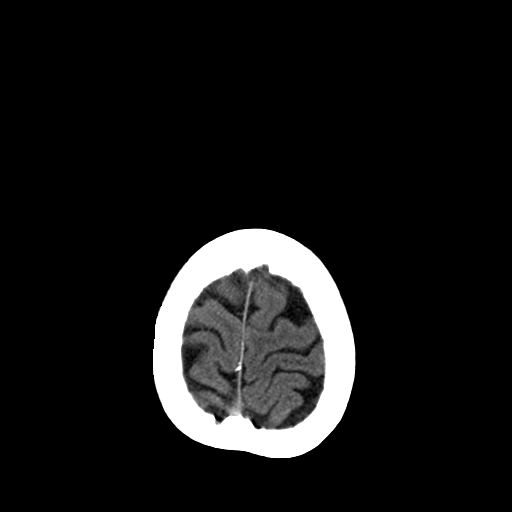
[im 27/28  brain]
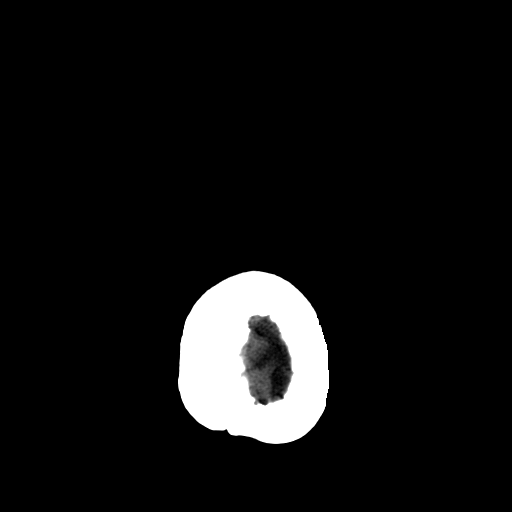

[16 of 28 positions shown; findings below may reference images not displayed]

FINDINGS: No evidence of acute infarct, acute hemorrhage, mass lesion, mass effect, or hydrocephalus.  Visualized paranasal sinuses and mastoid air cells are clear.
IMPRESSION: No acute intracranial abnormality.

## 2008-02-11 ENCOUNTER — Other Ambulatory Visit: Admission: RE | Admit: 2008-02-11 | Discharge: 2008-02-11 | Payer: Self-pay | Admitting: Obstetrics & Gynecology

## 2008-02-27 ENCOUNTER — Emergency Department (HOSPITAL_COMMUNITY): Admission: EM | Admit: 2008-02-27 | Discharge: 2008-02-27 | Payer: Self-pay | Admitting: Family Medicine

## 2008-02-27 IMAGING — CR DG CHEST 2V
2 series · 2 of 2 positions shown · non-contrast
Comparison: [DATE]

CLINICAL DATA: Cough.  Shortness of breath.  Chest pain.  Fever.

CHEST - 2 VIEW

[view not recorded (1 of 2)]
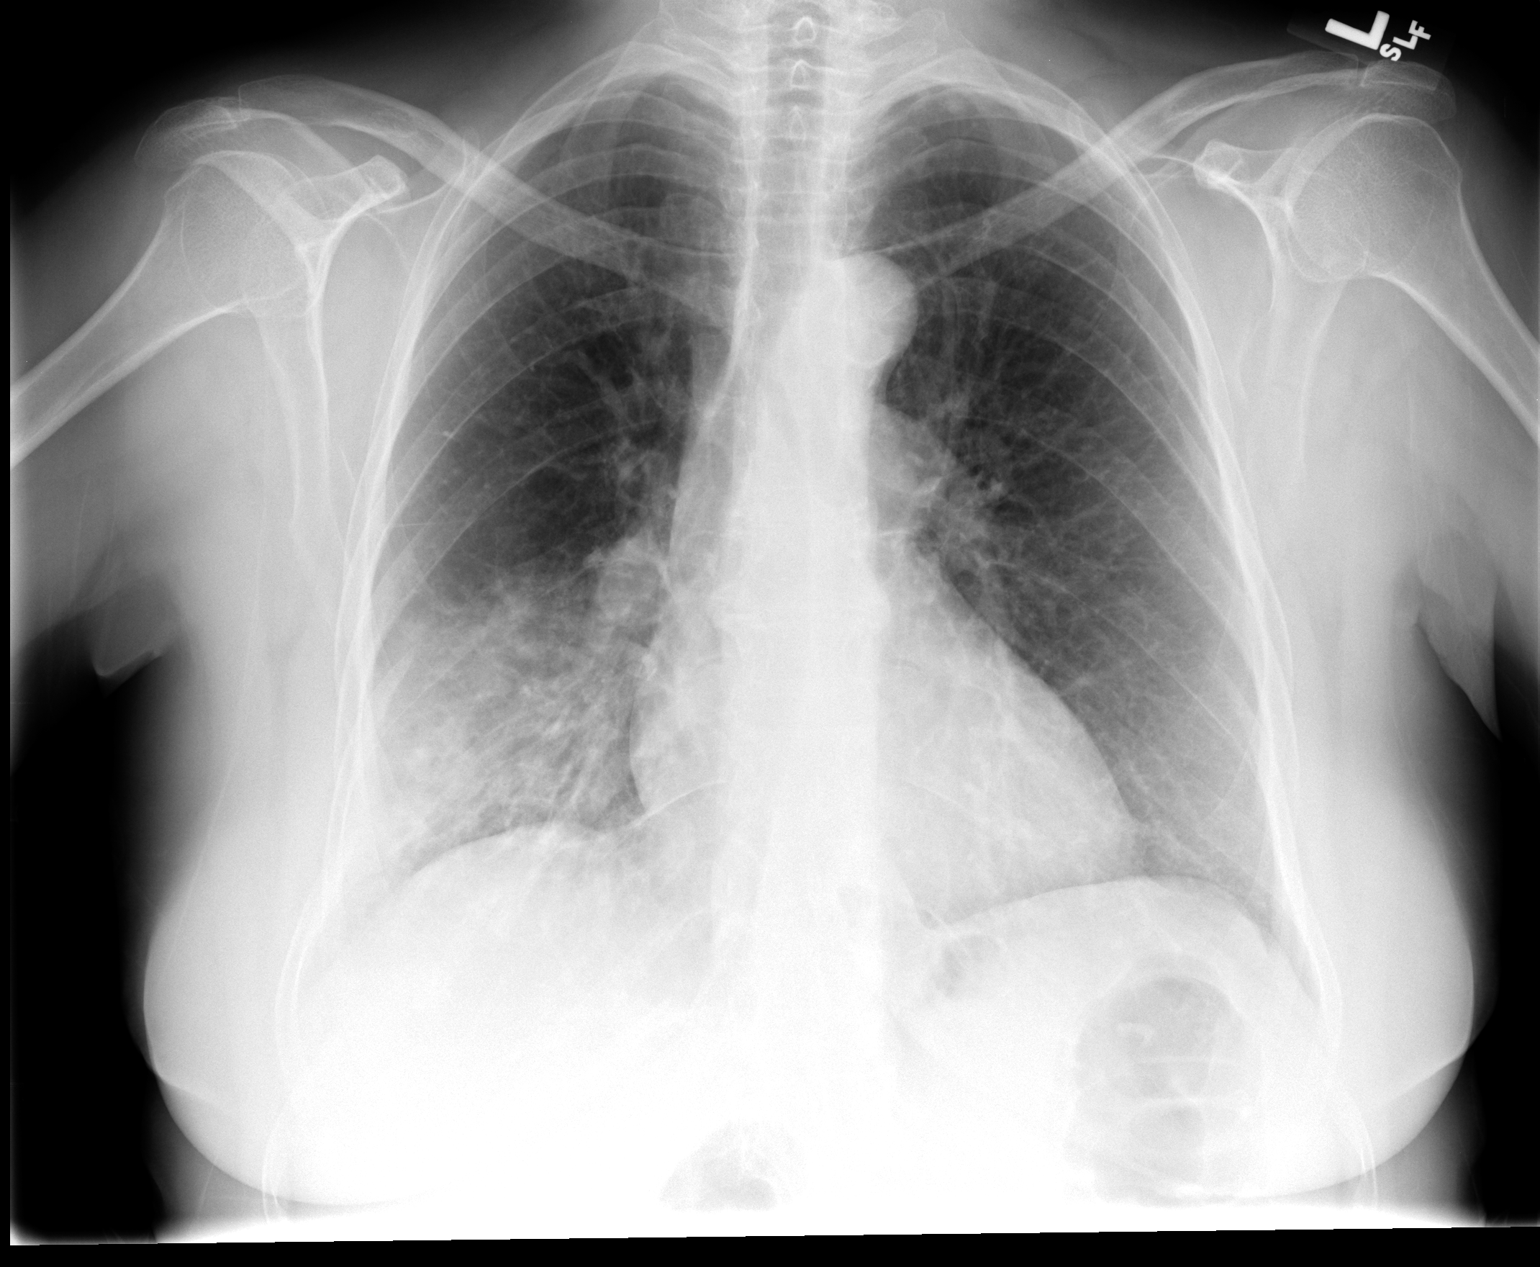

[view not recorded (2 of 2)]
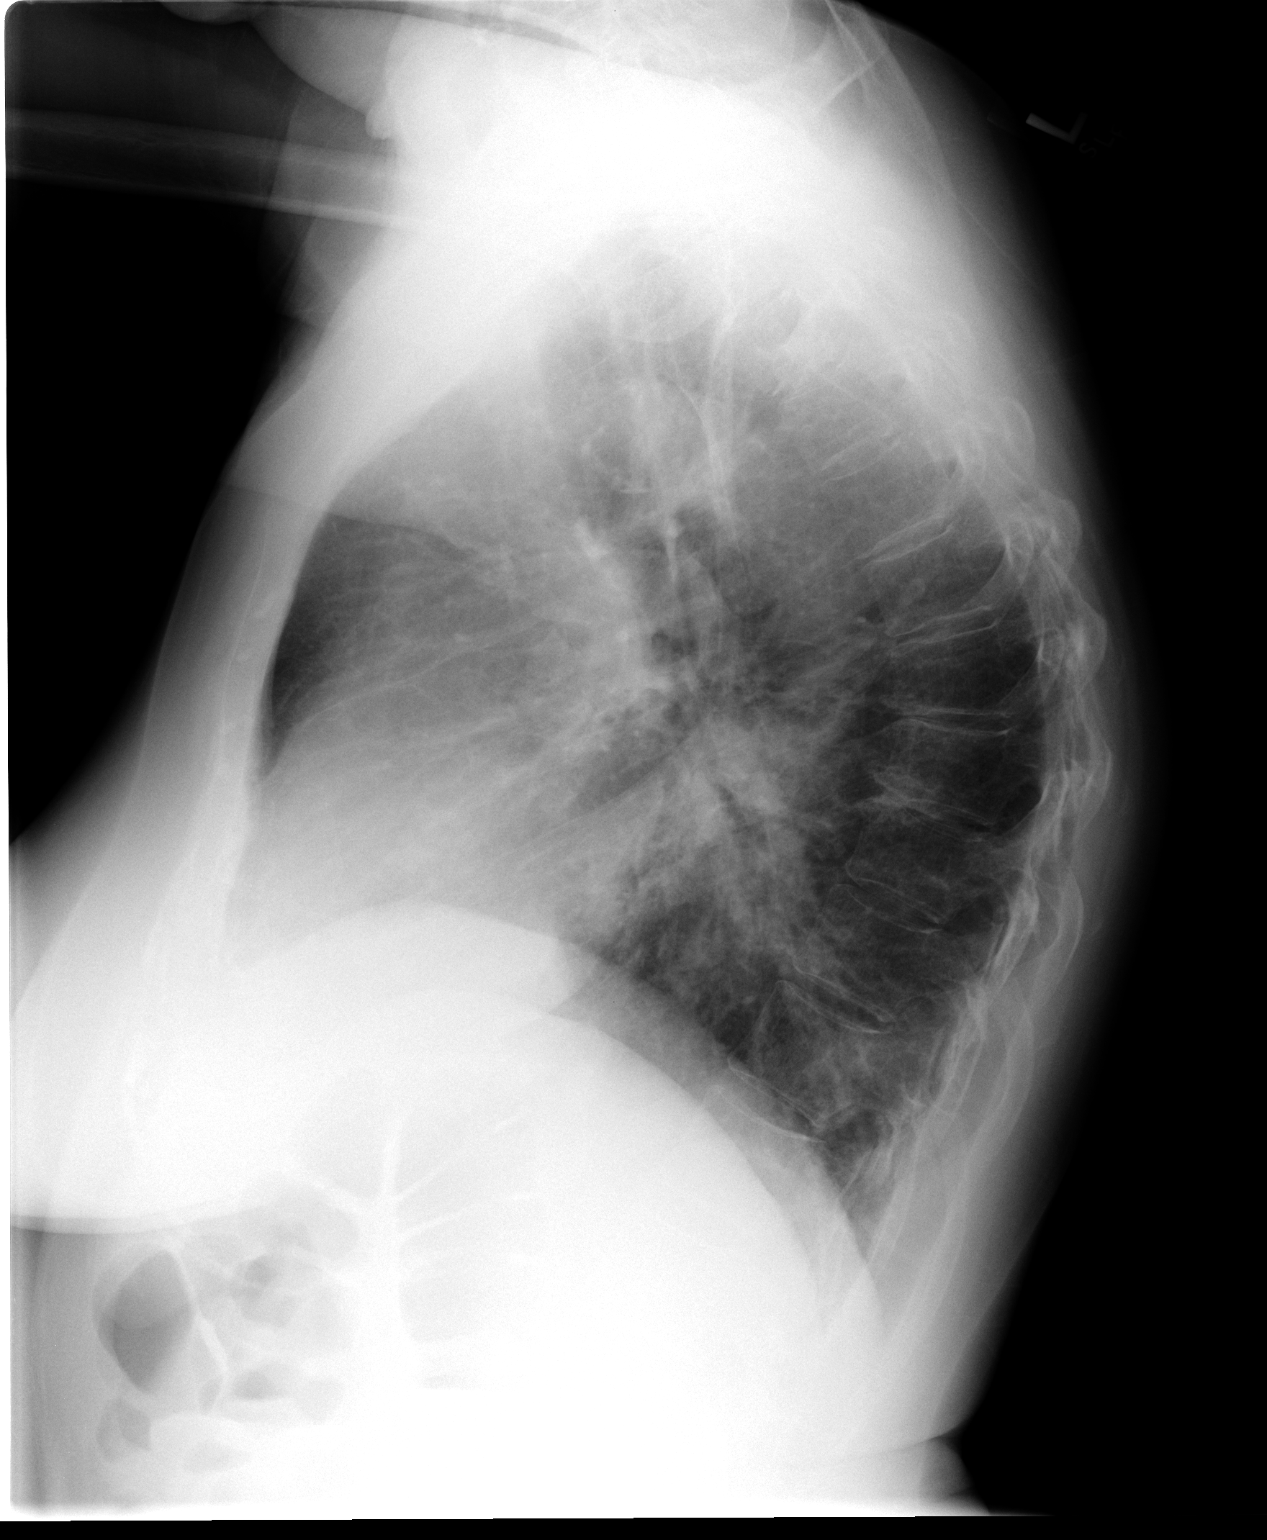

[2 of 2 positions shown; findings below may reference images not displayed]

FINDINGS: Right lower lobe airspace opacity is compatible with
pneumonia.  The left lung appears clear other than for mild
biapical pleuroparenchymal scarring.  Heart size appears
unremarkable.  There is thoracic spondylosis and likely a small
hiatal hernia.
IMPRESSION: 1.  Right lower lobe airspace opacity compatible with pneumonia.
Follow-up chest radiography is recommended to ensure complete
clearance and exclude the unlikely possibility of underlying
malignancy.
2.  Small hiatal hernia.
3.  Thoracic spondylosis.

## 2008-03-06 ENCOUNTER — Encounter: Admission: RE | Admit: 2008-03-06 | Discharge: 2008-03-06 | Payer: Self-pay | Admitting: Internal Medicine

## 2008-03-06 IMAGING — CR DG CHEST 2V
2 series · 2 of 2 positions shown · non-contrast
Comparison: [DATE]

CLINICAL DATA: Pneumonia

CHEST - 2 VIEW

[view not recorded (1 of 2)]
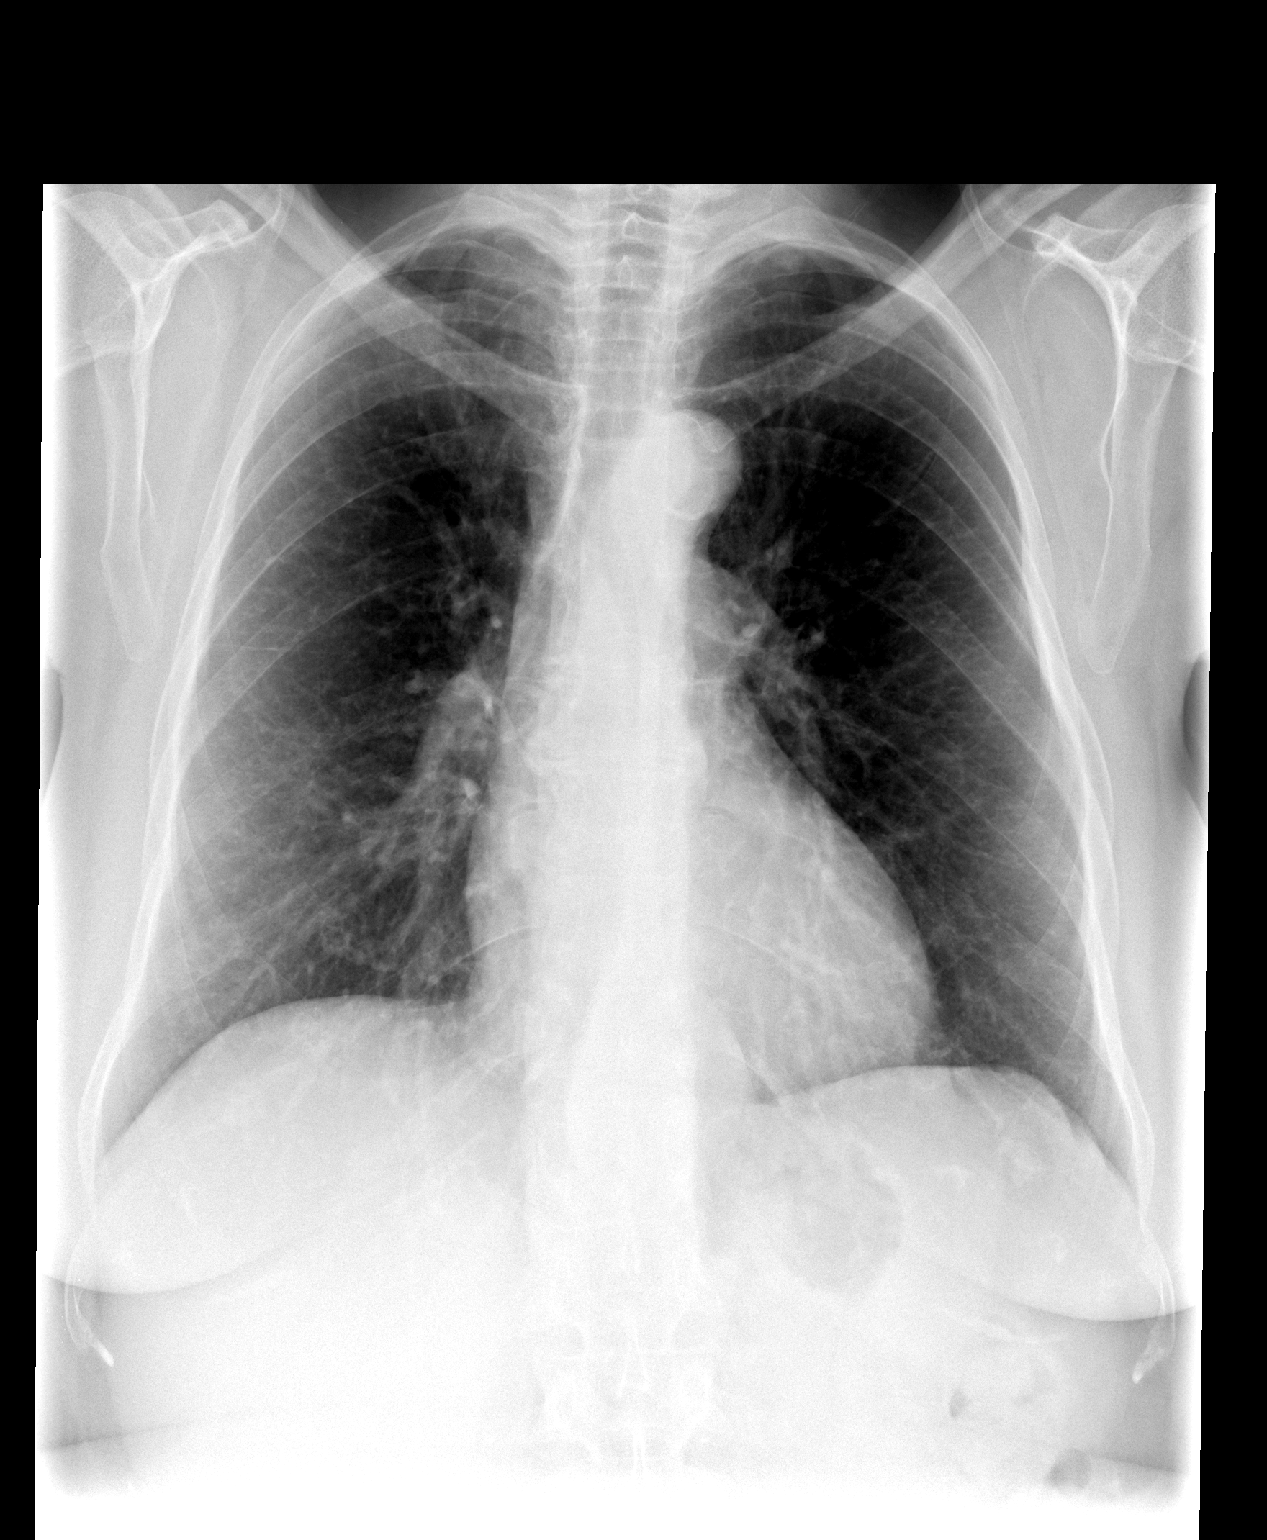

[view not recorded (2 of 2)]
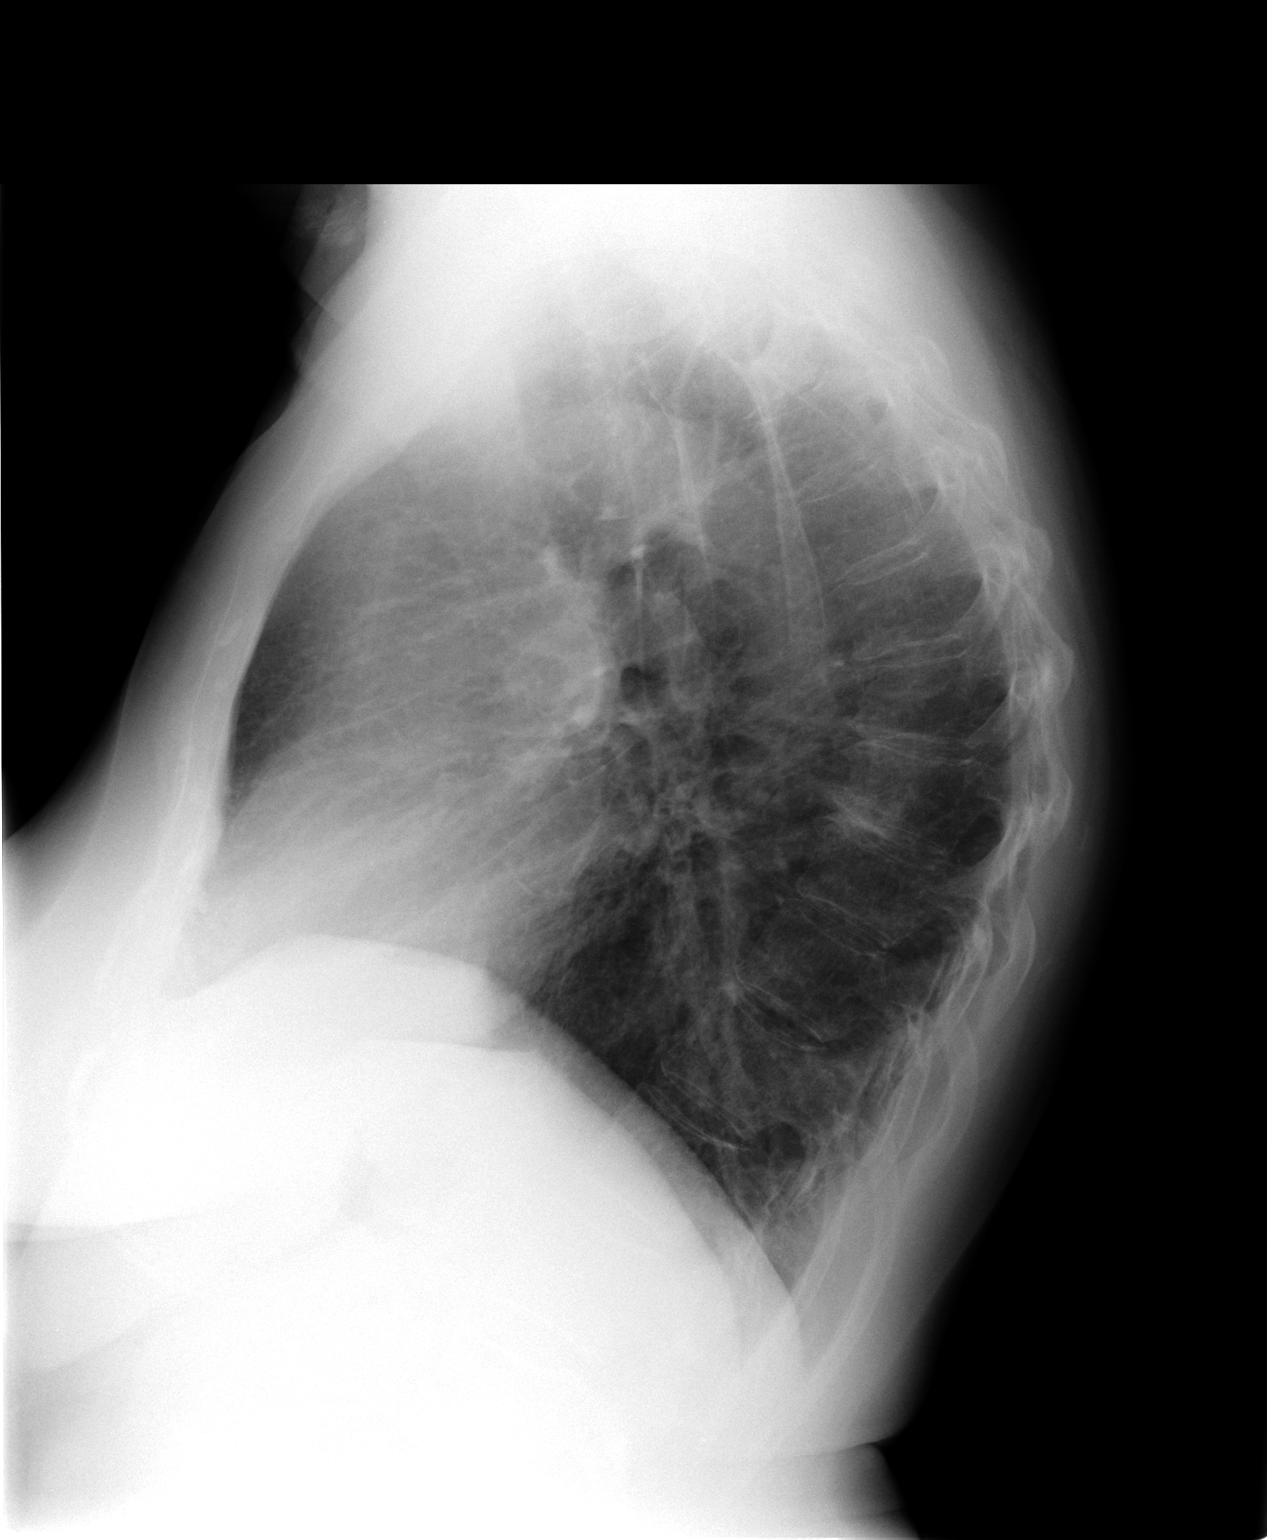

[2 of 2 positions shown; findings below may reference images not displayed]

FINDINGS: The cardiac silhouette, mediastinal and hilar contours
are within normal limits and stable.  There are mild underlying
stable changes of COPD.  The right lower lobe pneumonia has
resolved.
IMPRESSION: 1.  Resolution of right lower lobe pneumonia.  Minimal residual
post pneumonic atelectasis.

## 2008-03-08 ENCOUNTER — Ambulatory Visit: Payer: Self-pay | Admitting: Cardiology

## 2008-03-08 ENCOUNTER — Inpatient Hospital Stay (HOSPITAL_COMMUNITY): Admission: EM | Admit: 2008-03-08 | Discharge: 2008-03-14 | Payer: Self-pay | Admitting: Emergency Medicine

## 2008-03-08 IMAGING — CR DG CHEST 2V
2 series · 2 of 2 positions shown · non-contrast
Comparison: [DATE].

CLINICAL DATA: Cough, shortness of breath and chest pain.  Recent
pneumonia.

CHEST - 2 VIEW

[w chest pa]
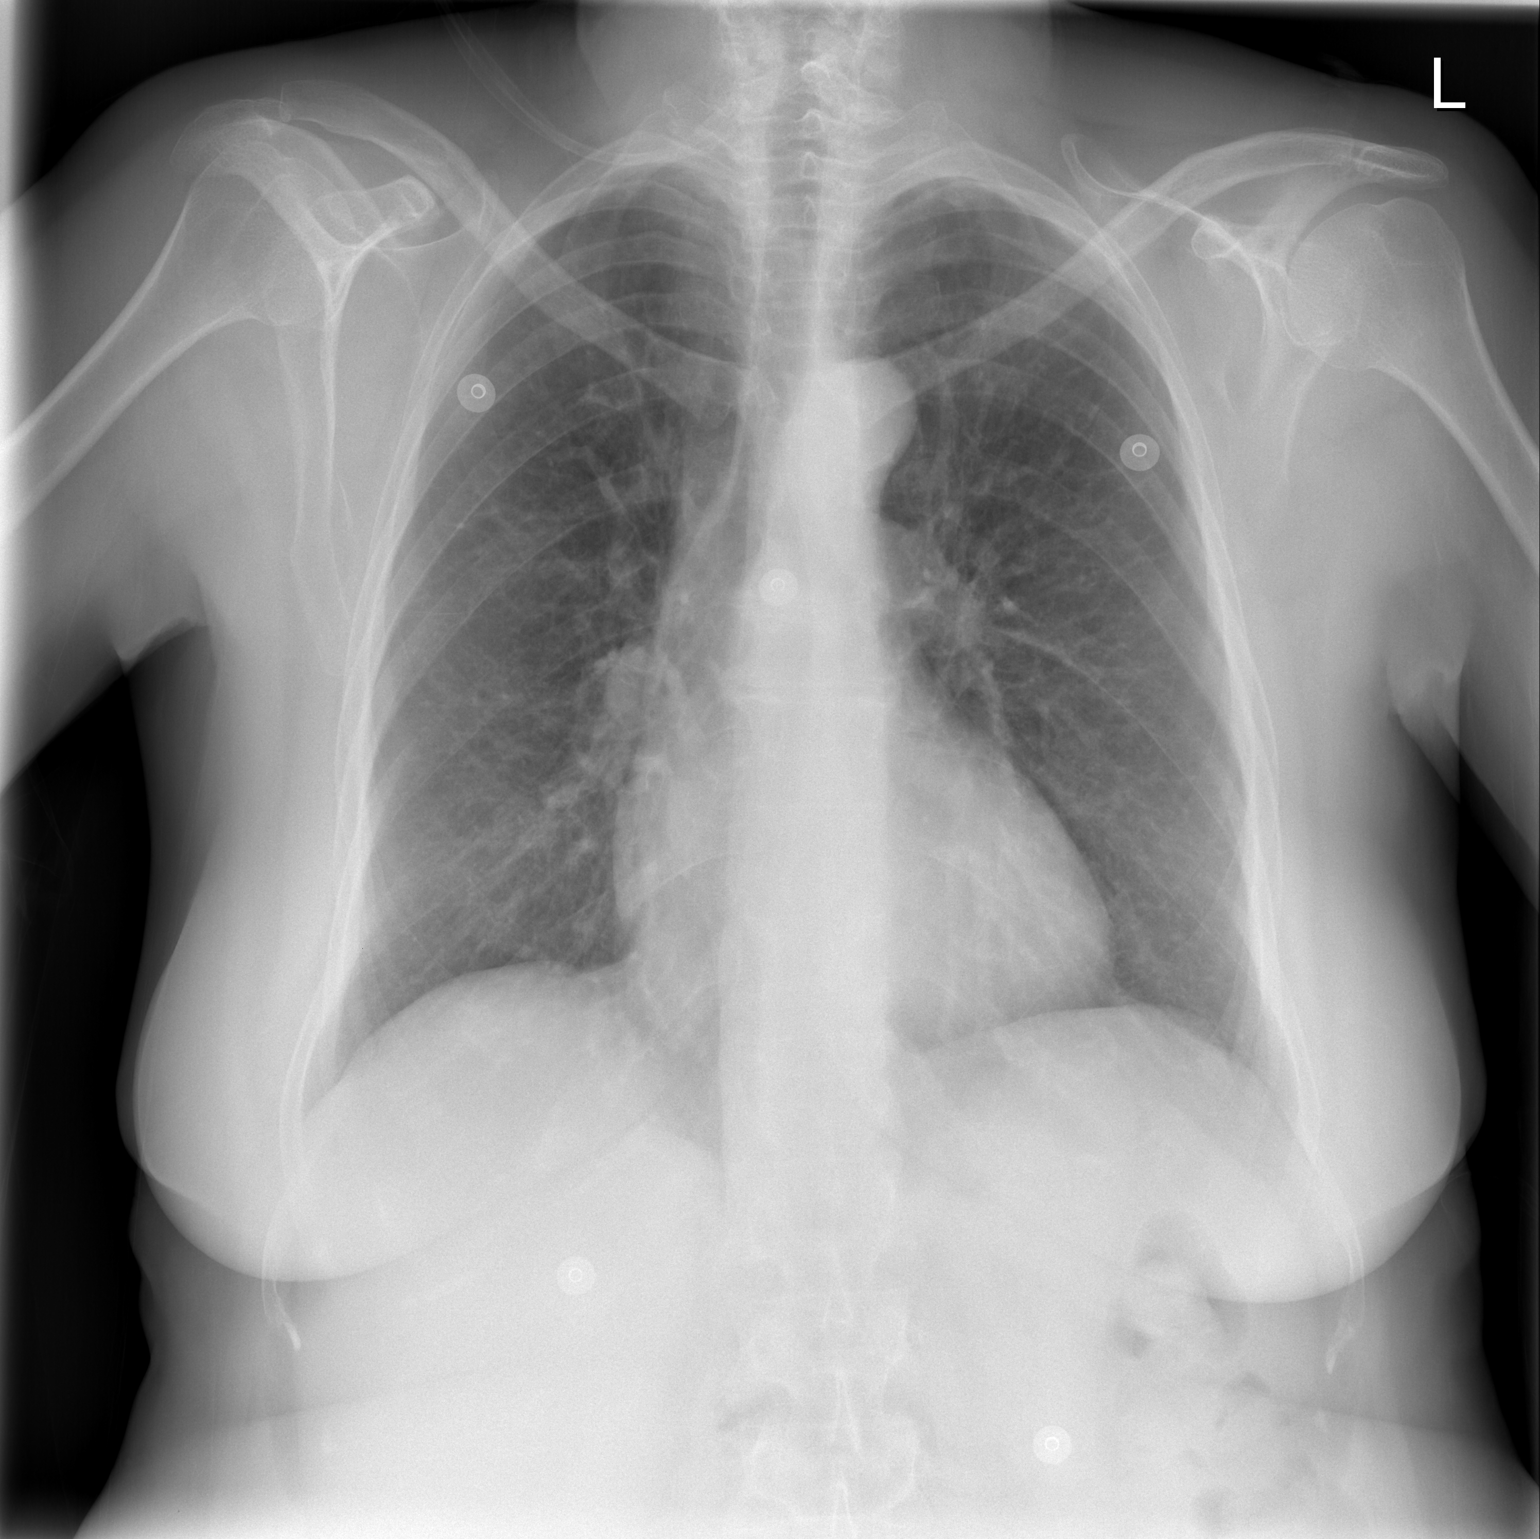

[w chest lat]
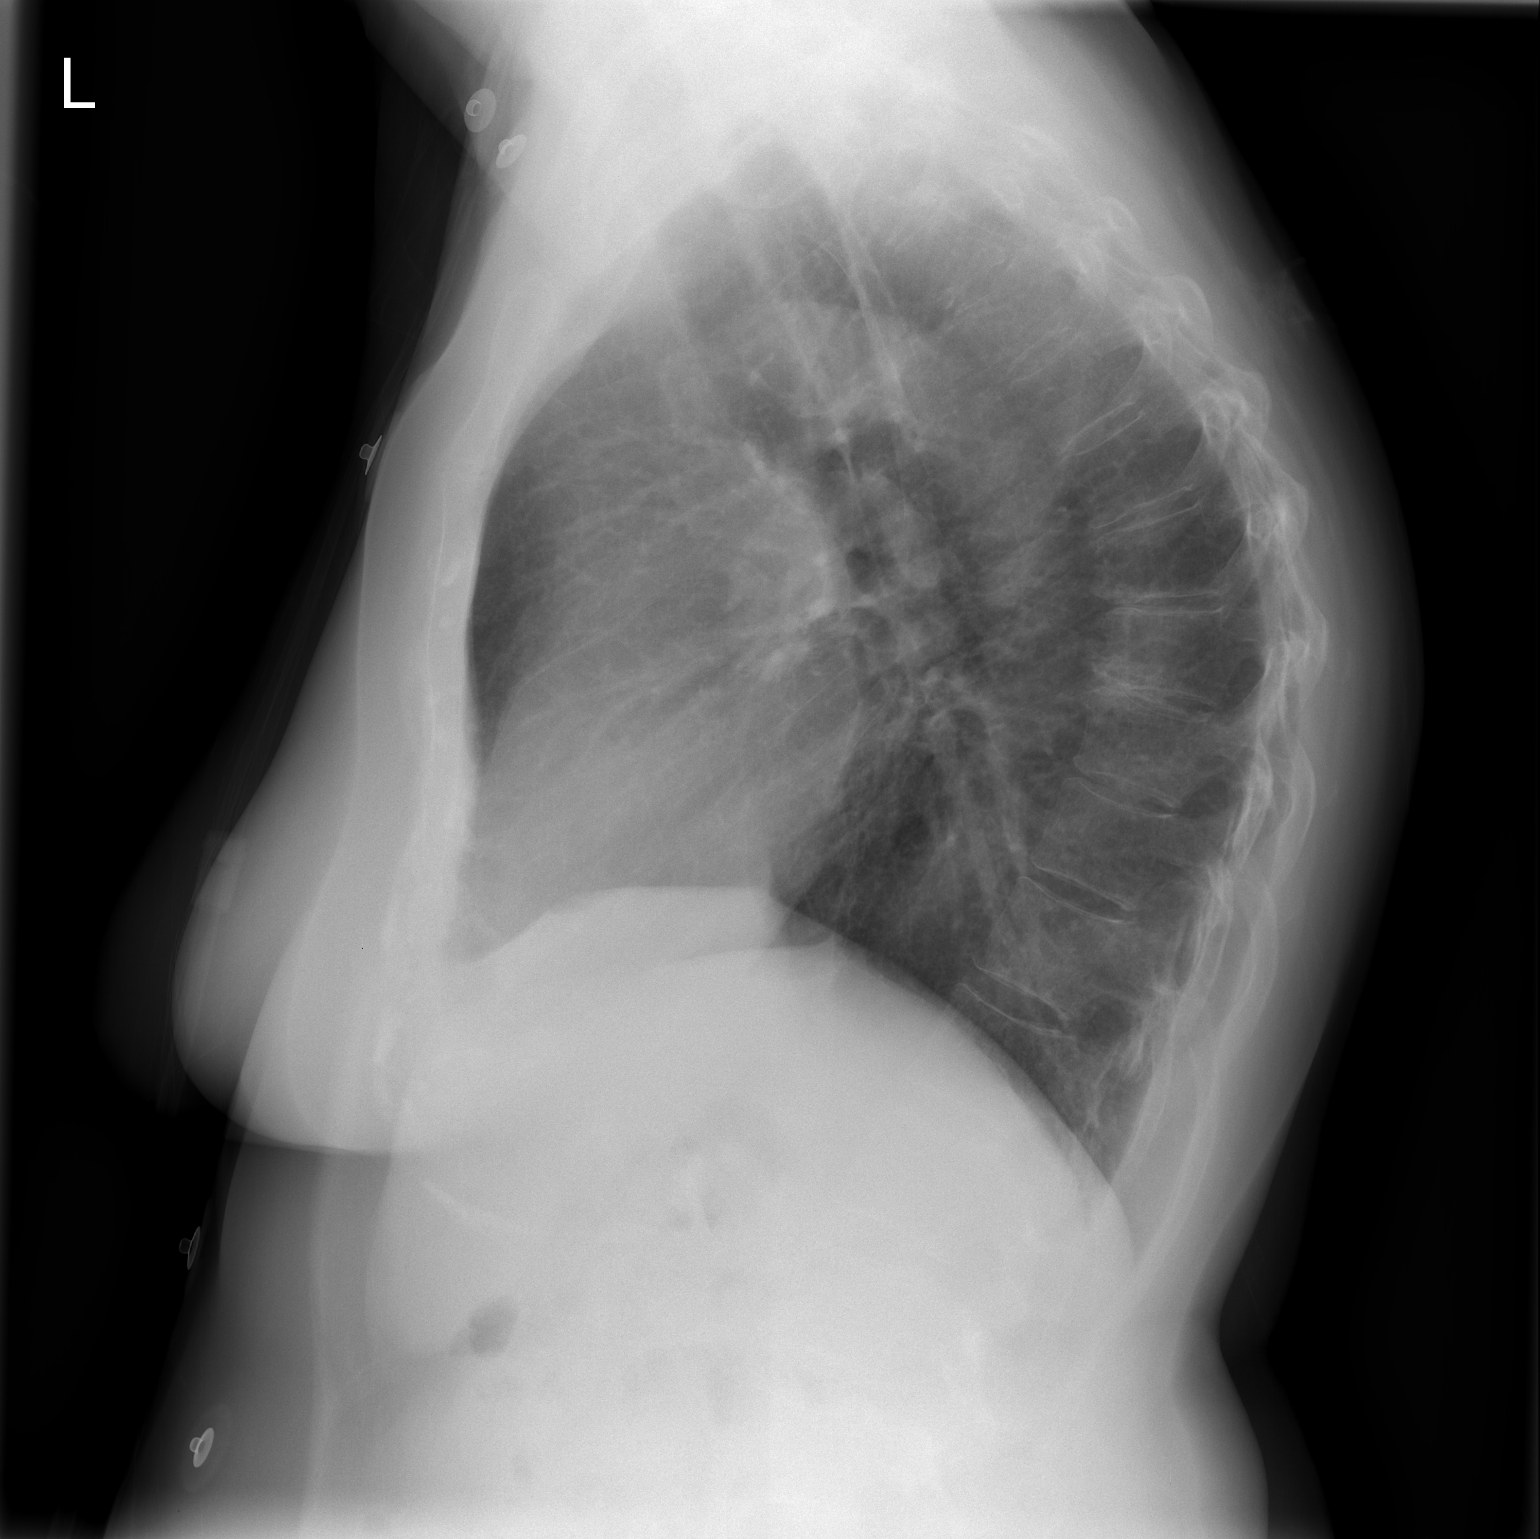

[2 of 2 positions shown; findings below may reference images not displayed]

FINDINGS: Stable normal sized heart and changes of COPD.  No
airspace consolidation.  Diffuse osteopenia with stable mild mid
thoracic anterior wedge deformities.
IMPRESSION: Stable changes of COPD.  No acute abnormality.

## 2008-03-09 ENCOUNTER — Encounter (INDEPENDENT_AMBULATORY_CARE_PROVIDER_SITE_OTHER): Payer: Self-pay | Admitting: Internal Medicine

## 2008-03-10 IMAGING — CR DG CHEST 1V PORT
1 series · 1 of 1 positions shown · non-contrast
Comparison: [DATE]

CLINICAL DATA: Chest pain and short of breath.

PORTABLE CHEST - 1 VIEW

[view not recorded]
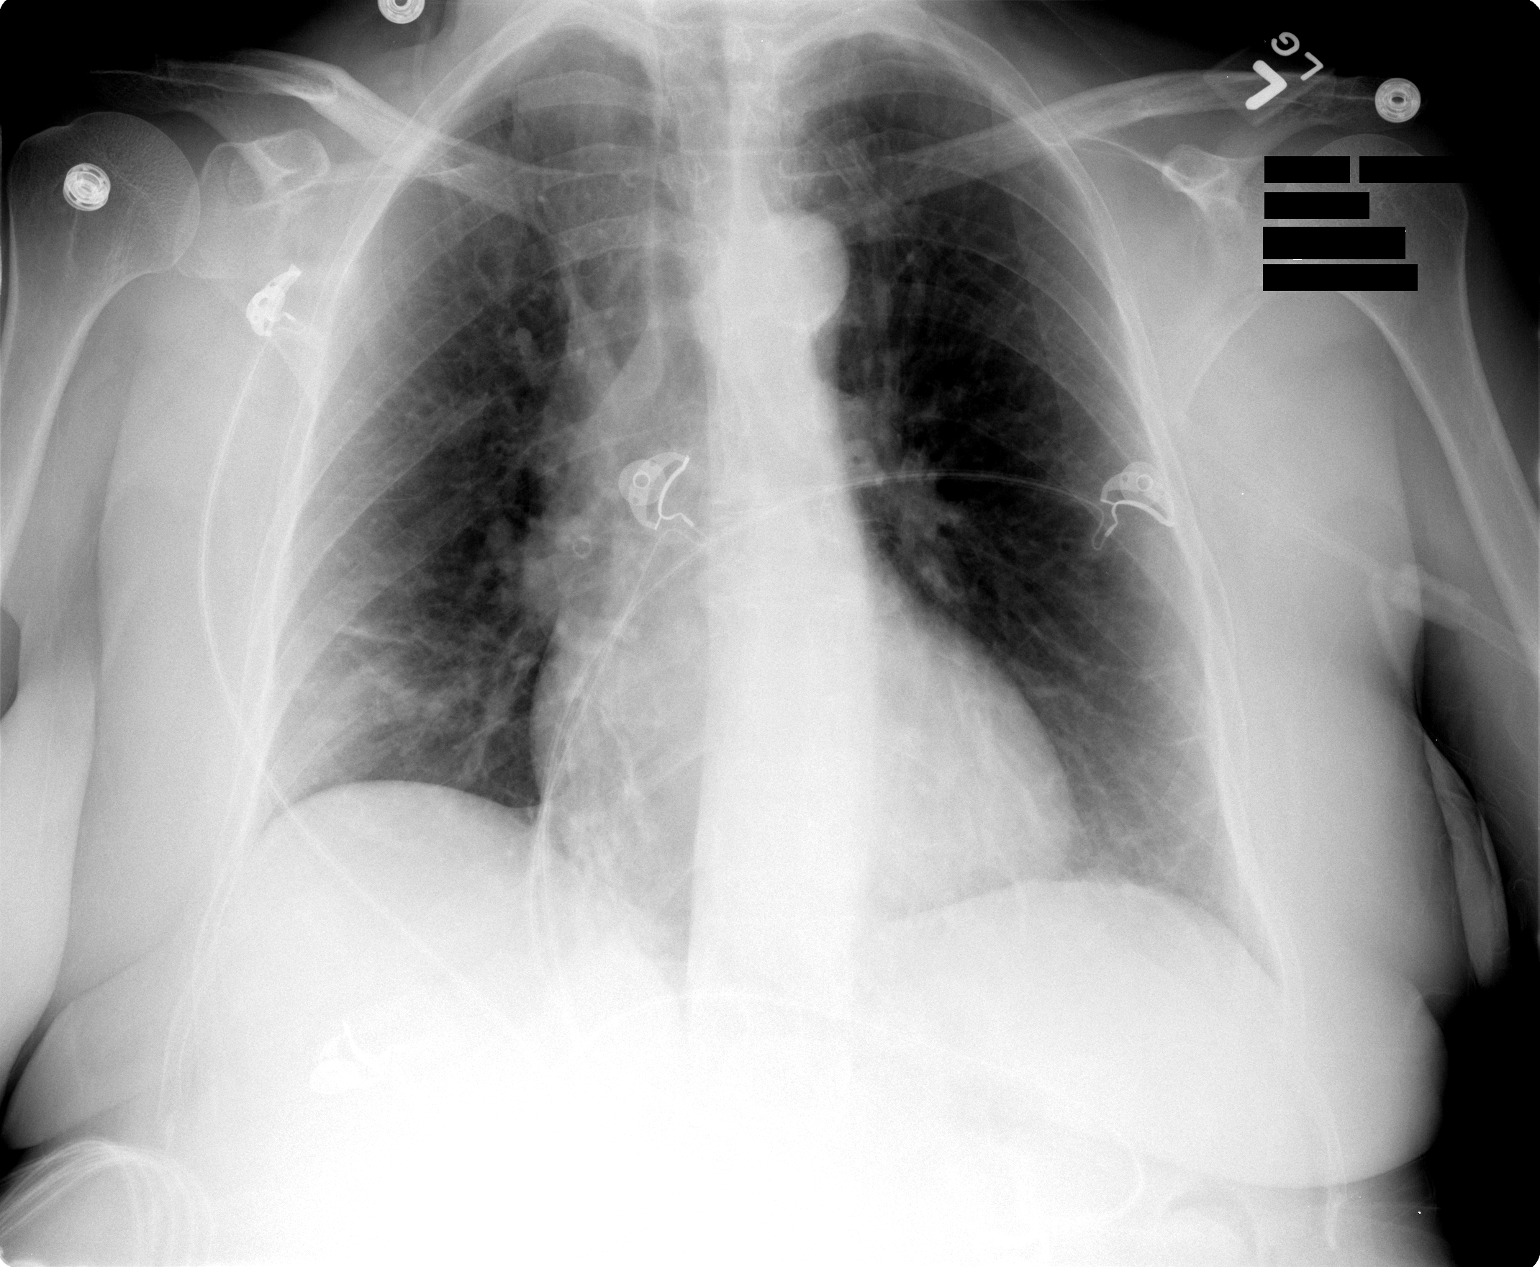

[1 of 1 positions shown; findings below may reference images not displayed]

FINDINGS: The heart size is normal and the vascularity is normal.
There is a small patchy area of airspace disease in the right lung
base.  There is associated mild elevation right hemidiaphragm and
this may be due to atelectasis or possibly pneumonia.  There is no
effusion.  The left lung is clear.
IMPRESSION: There is a new area of right lower lobe air space disease which
could be atelectasis or pneumonia.

## 2008-03-12 IMAGING — CT CT CHEST W/O CM
3 of 4 series · 17 of 30 positions shown, 19 images · non-contrast
Comparison: Radiograph same day

CLINICAL DATA: 61-year-old female with chest pain

CT CHEST WITHOUT CONTRAST
TECHNIQUE: Multidetector CT imaging of the chest was performed
following the standard protocol without IV contrast.

[Series 2: chest_routine 5.0 b40f st · axial · 0.59mm/px · z∈[-239,-39]mm · 6 of 60 slices shown, 8 images (1 of 3)]
[im 10/60  mediastinal]
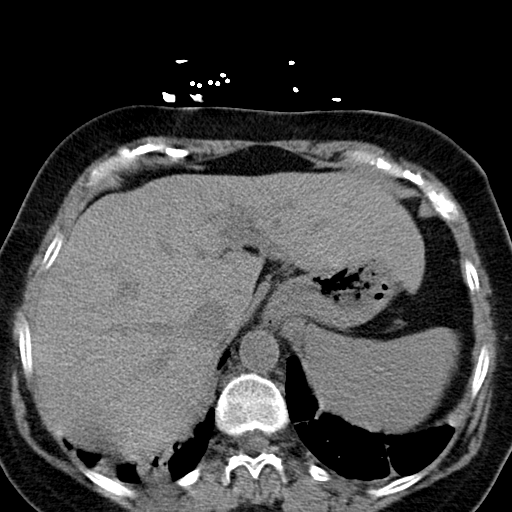
[im 10/60  lung]
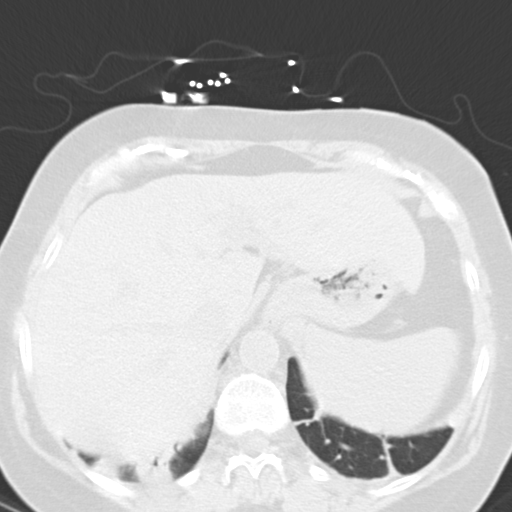
[im 20/60  lung]
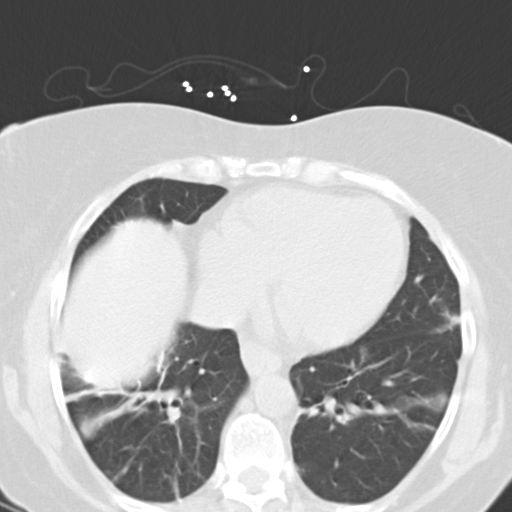
[im 22/60  lung]
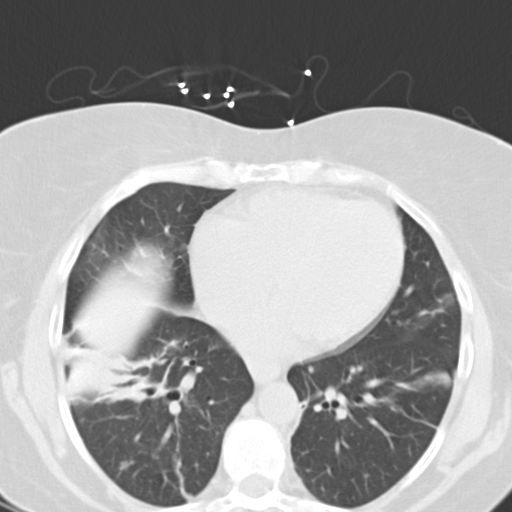
[im 30/60  lung]
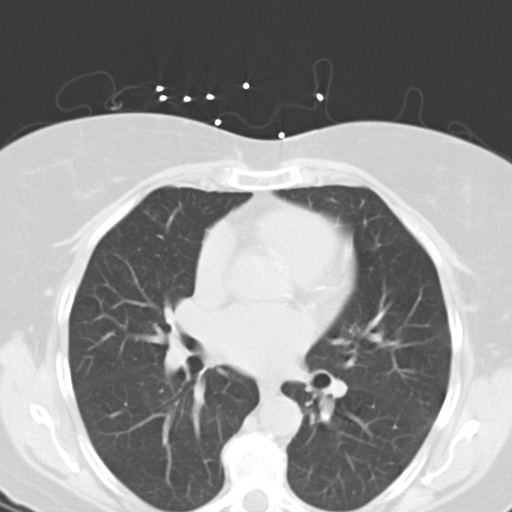
[im 40/60  mediastinal]
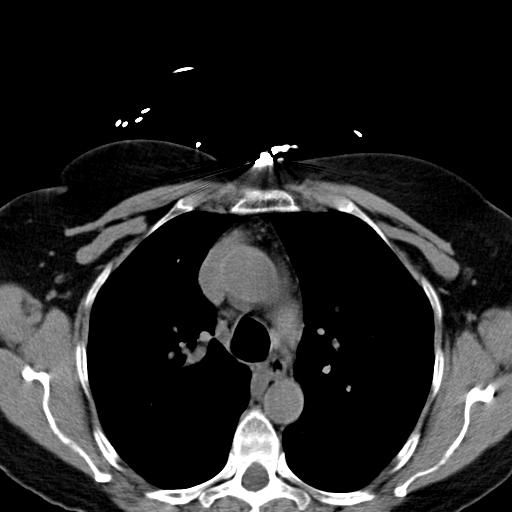
[im 40/60  lung]
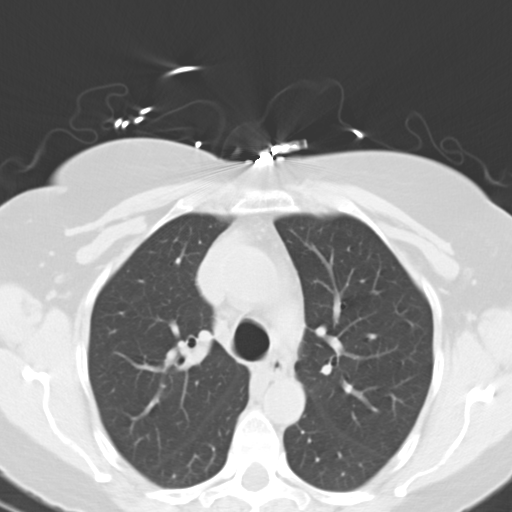
[im 50/60  lung]
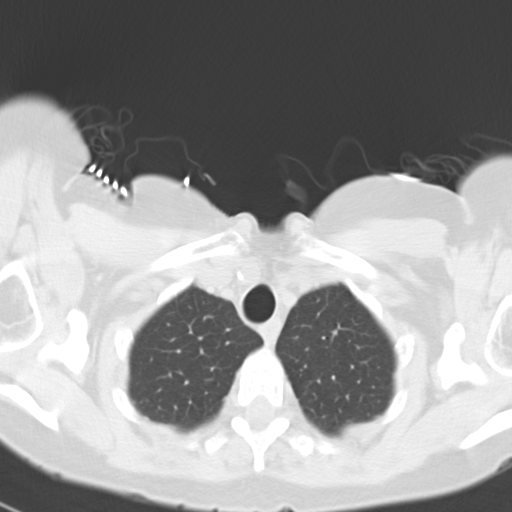

[Series 6: chest_routine 5.0 b40f st · axial · 0.59mm/px · z∈[-346,-312]mm · 6 of 81 slices shown (2 of 3)]
[im 12/81  lung]
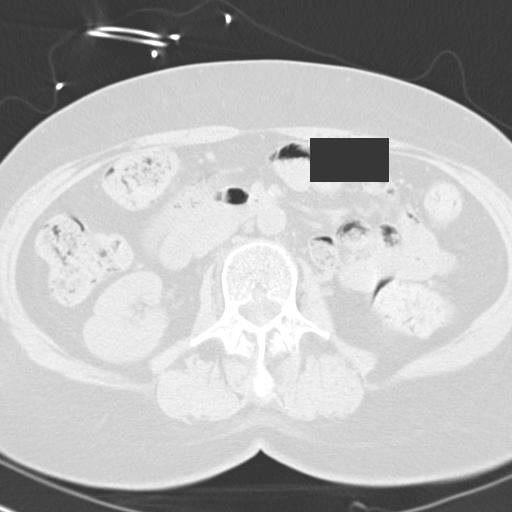
[im 23/81  lung]
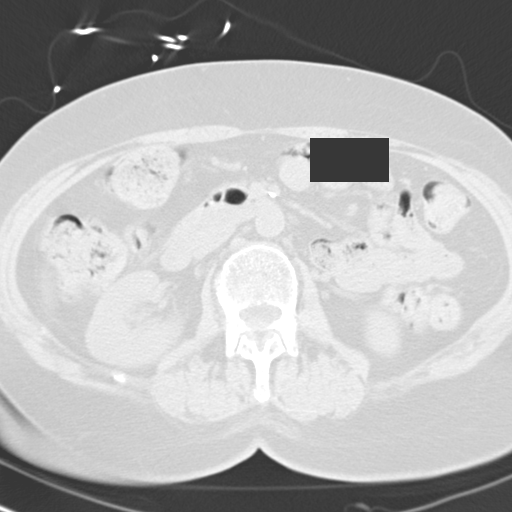
[im 35/81  lung]
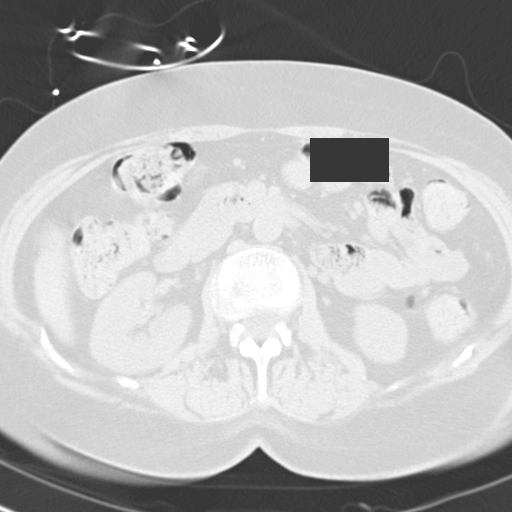
[im 46/81  lung]
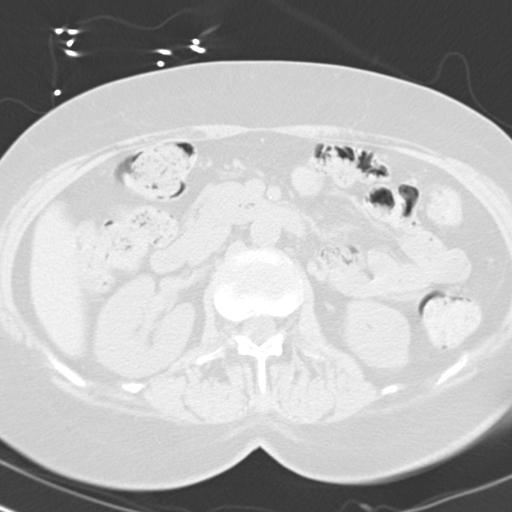
[im 58/81  lung]
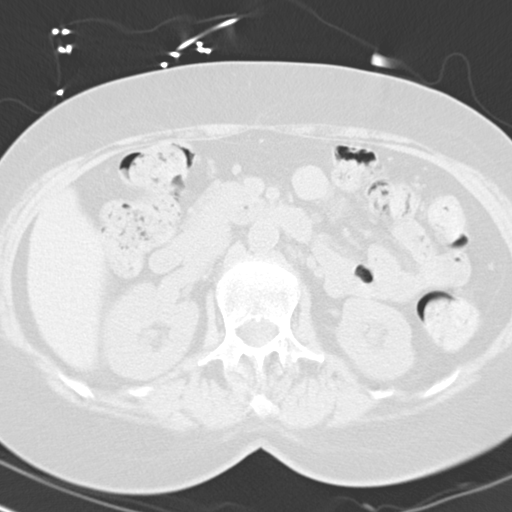
[im 69/81  lung]
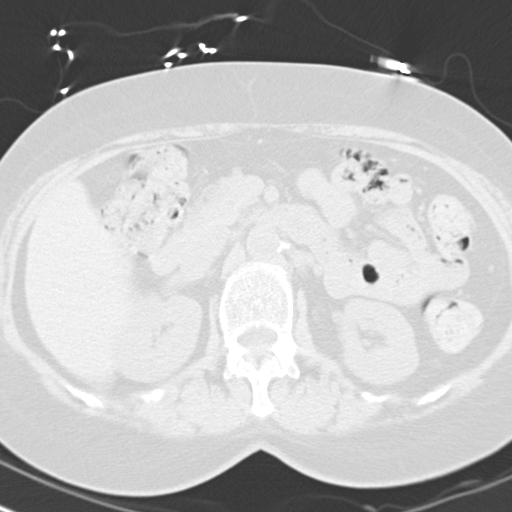

[Series 7: chest_routine 5.0 b40f st · axial · 0.59mm/px · z∈[-229,-49]mm · 5 of 60 slices shown (3 of 3)]
[im 12/60  lung]
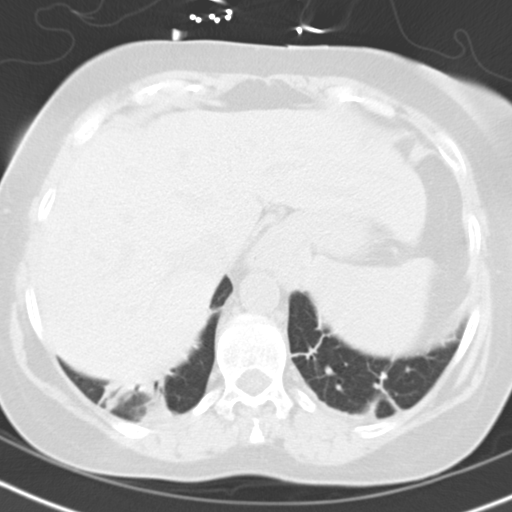
[im 22/60  lung]
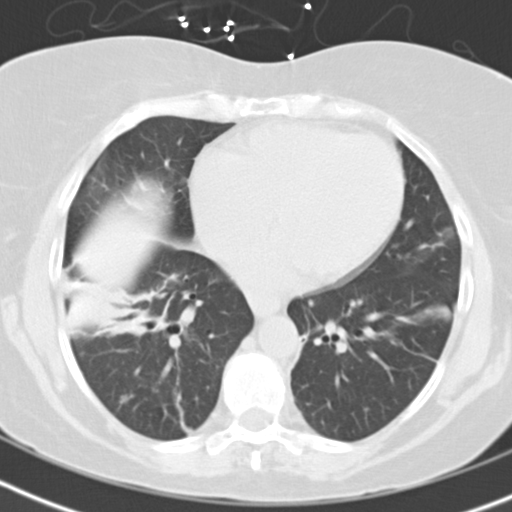
[im 24/60  lung]
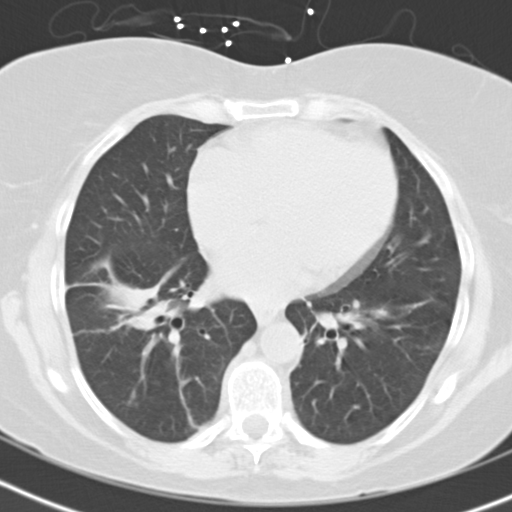
[im 36/60  lung]
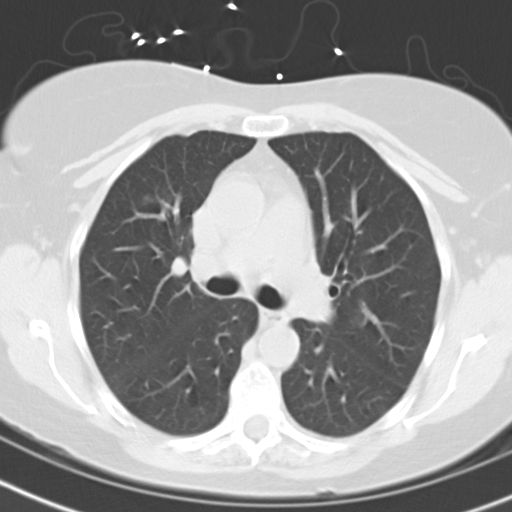
[im 48/60  lung]
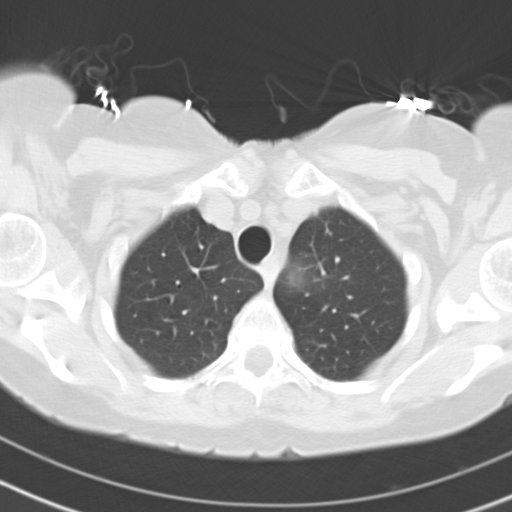

[17 of 30 positions shown; findings below may reference images not displayed]

FINDINGS: No evidence of axillary, supraclavicular, or mediastinal
lymphadenopathy.  Heart appears normal.  No pericardial effusion.
Aorta and great vessels appear normal.

Review of the lung parenchyma none demonstrates a small focus of
wedge-like consolidation in the lateral aspect of the right lower
lobe with associated air bronchograms.  There is mild pleural
parenchymal thickening at the right lung base and left lung base.
Ground-glass opacities in the right middle lobe.  The airway
appears normal.

Limited view of the upper abdomen demonstrates a moderate hiatal
hernia.  Adrenal glands appear normal.

Review of  bone windows demonstrates no aggressive osseous lesions.
IMPRESSION: 1..  Likely right lower lobe bronchopneumonia.

## 2008-03-31 ENCOUNTER — Encounter: Admission: RE | Admit: 2008-03-31 | Discharge: 2008-03-31 | Payer: Self-pay | Admitting: Internal Medicine

## 2008-03-31 IMAGING — MG MM SCREEN MAMMOGRAM BILATERAL
5 series · 5 of 5 positions shown · non-contrast
Comparison: Prior studies.

DG SCREEN MAMMOGRAM BILATERAL
Bilateral CC and MLO view(s) were taken.
Prior study comparison: [DATE], DG screen mammogram bilateral.

DIGITAL SCREENING MAMMOGRAM WITH CAD:

[R CC (1 of 2)]
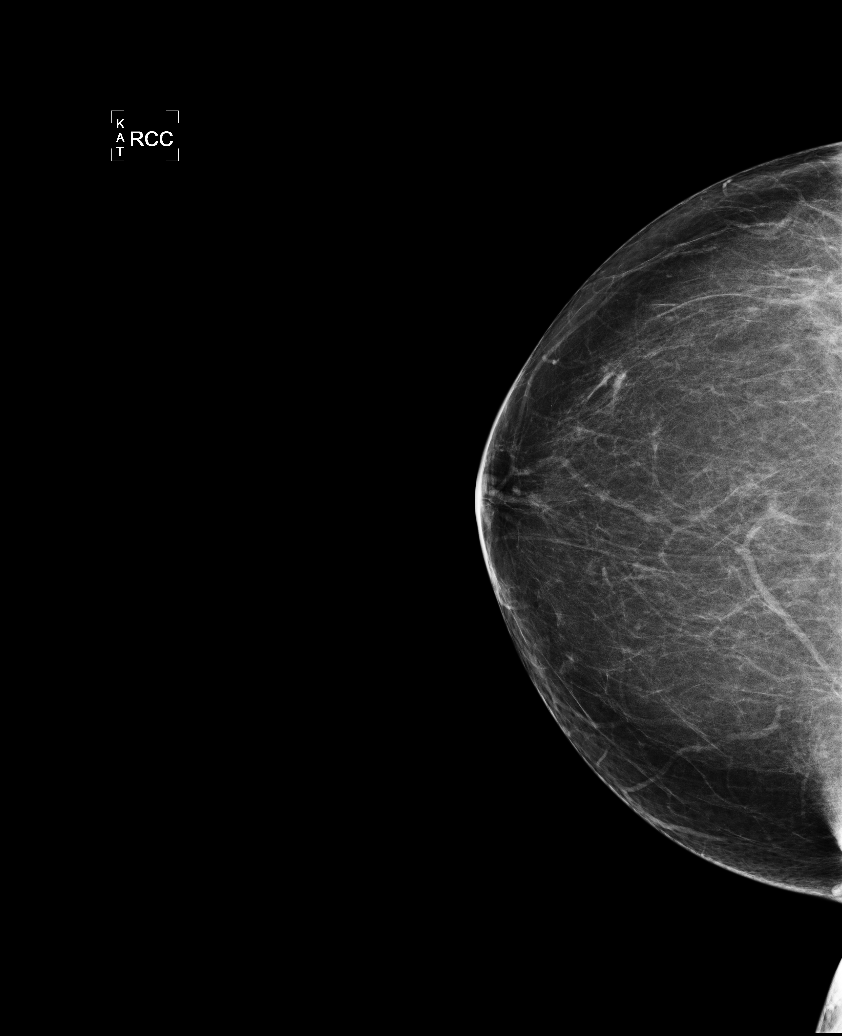

[L CC]
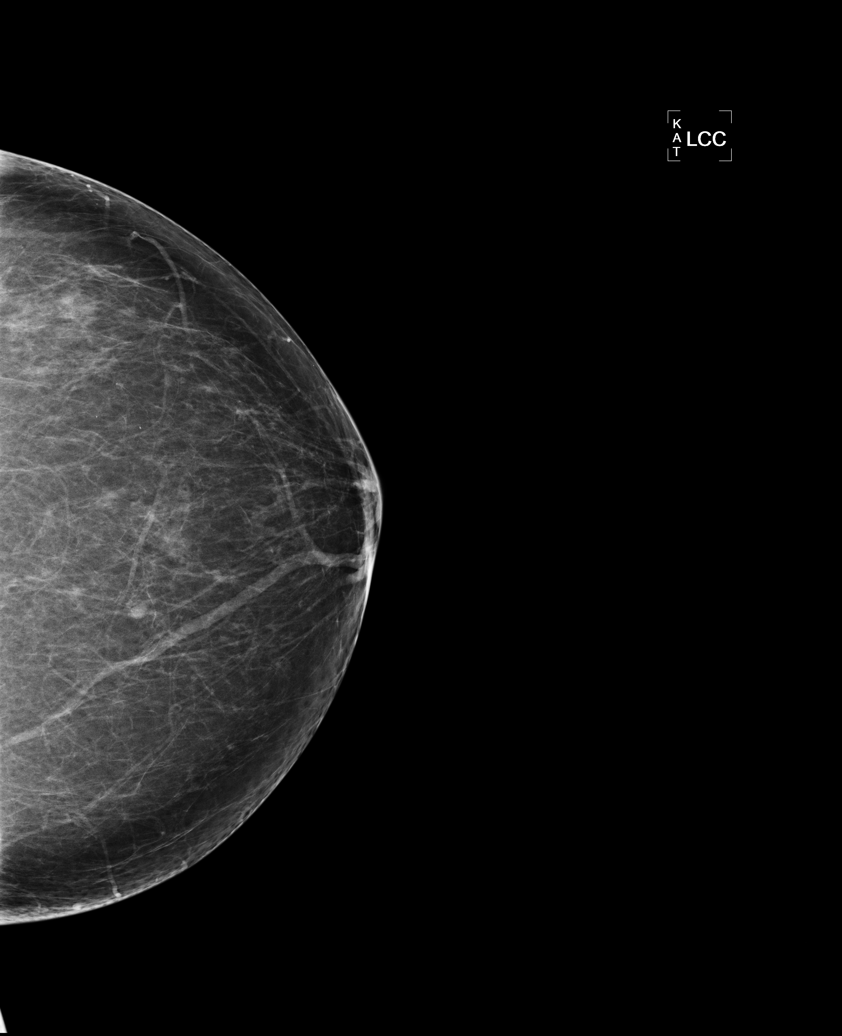

[L MLO]
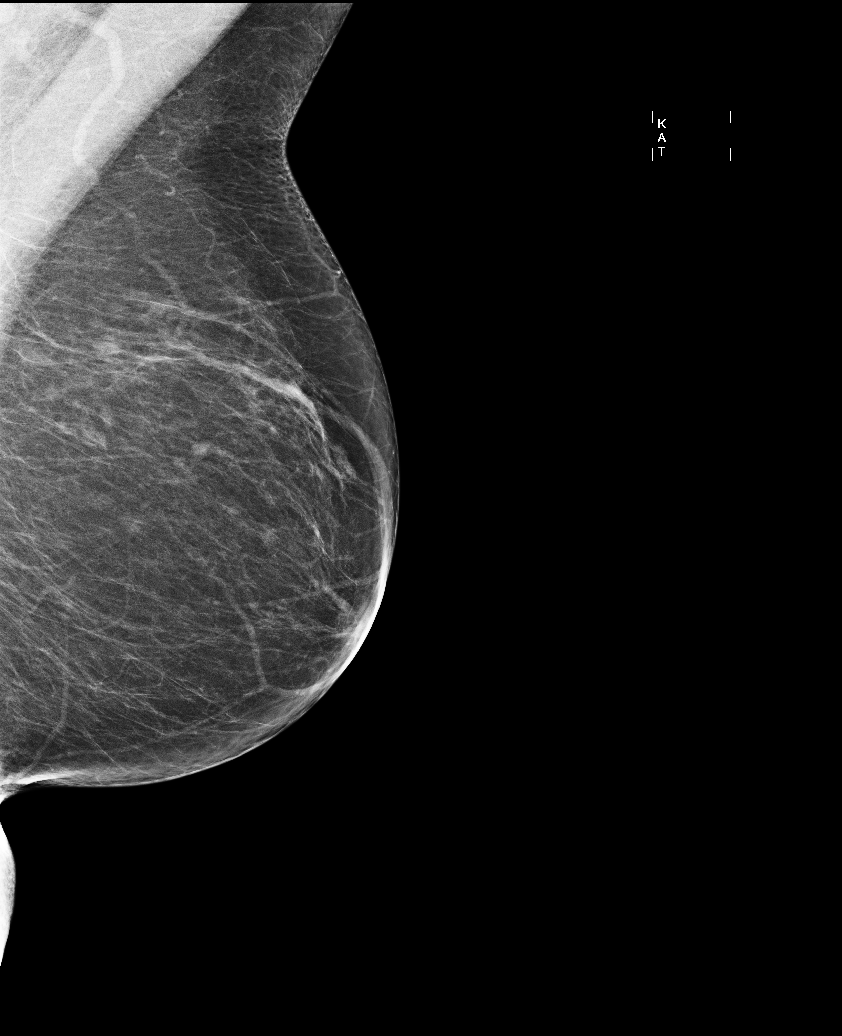

[R MLO]
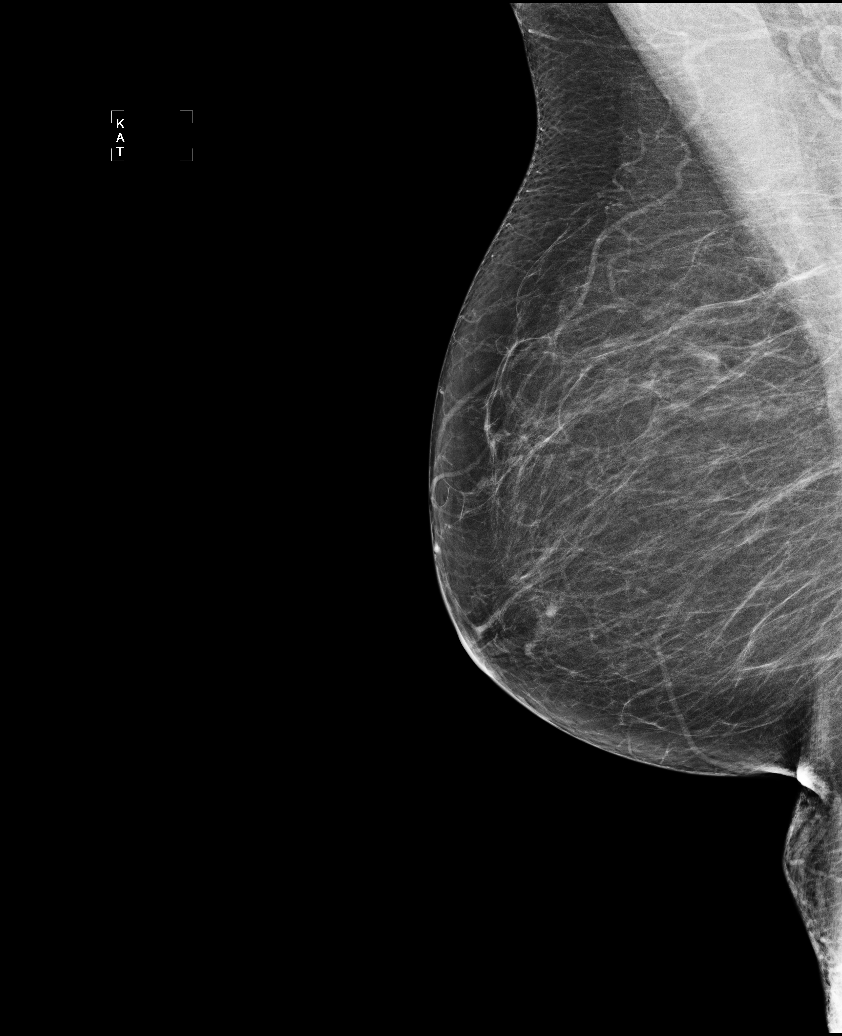

[R CC (2 of 2)]
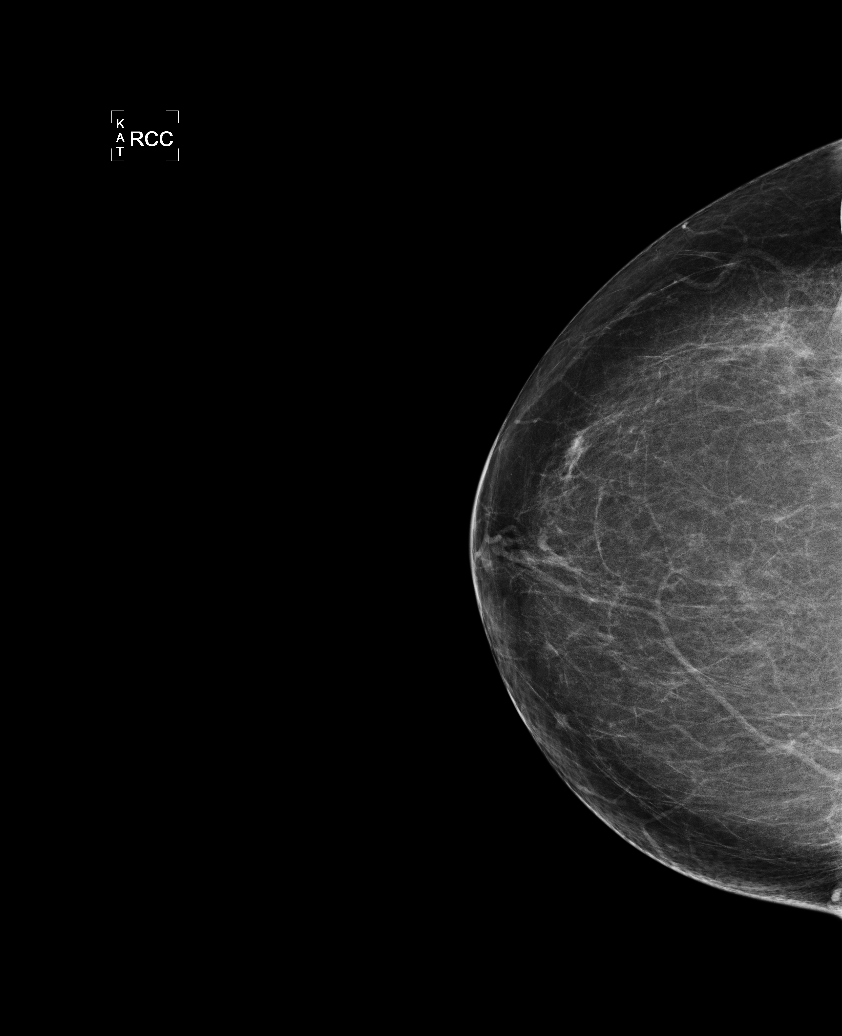

[5 of 5 positions shown; findings below may reference images not displayed]

The breast tissue is almost entirely fatty.  There is no dominant mass, architectural distortion or
calcification to suggest malignancy.
IMPRESSION: No mammographic evidence of malignancy.  Suggest yearly screening mammography.

ASSESSMENT: Negative - BI-RADS 1

Screening mammogram in 1 year.
ANALYZED BY COMPUTER AIDED DETECTION. , THIS PROCEDURE WAS A DIGITAL MAMMOGRAM.

## 2008-04-21 ENCOUNTER — Encounter: Admission: RE | Admit: 2008-04-21 | Discharge: 2008-04-21 | Payer: Self-pay | Admitting: Internal Medicine

## 2008-04-21 IMAGING — CR DG CHEST 2V
2 series · 2 of 2 positions shown · non-contrast
Comparison: [REDACTED] chest x-ray [DATE], [DATE], CT chest
[DATE].

CLINICAL DATA: Three pneumonia episodes this year.  Currently
asymptomatic.  Nonsmoker.

CHEST - 2 VIEW

[view not recorded (1 of 2)]
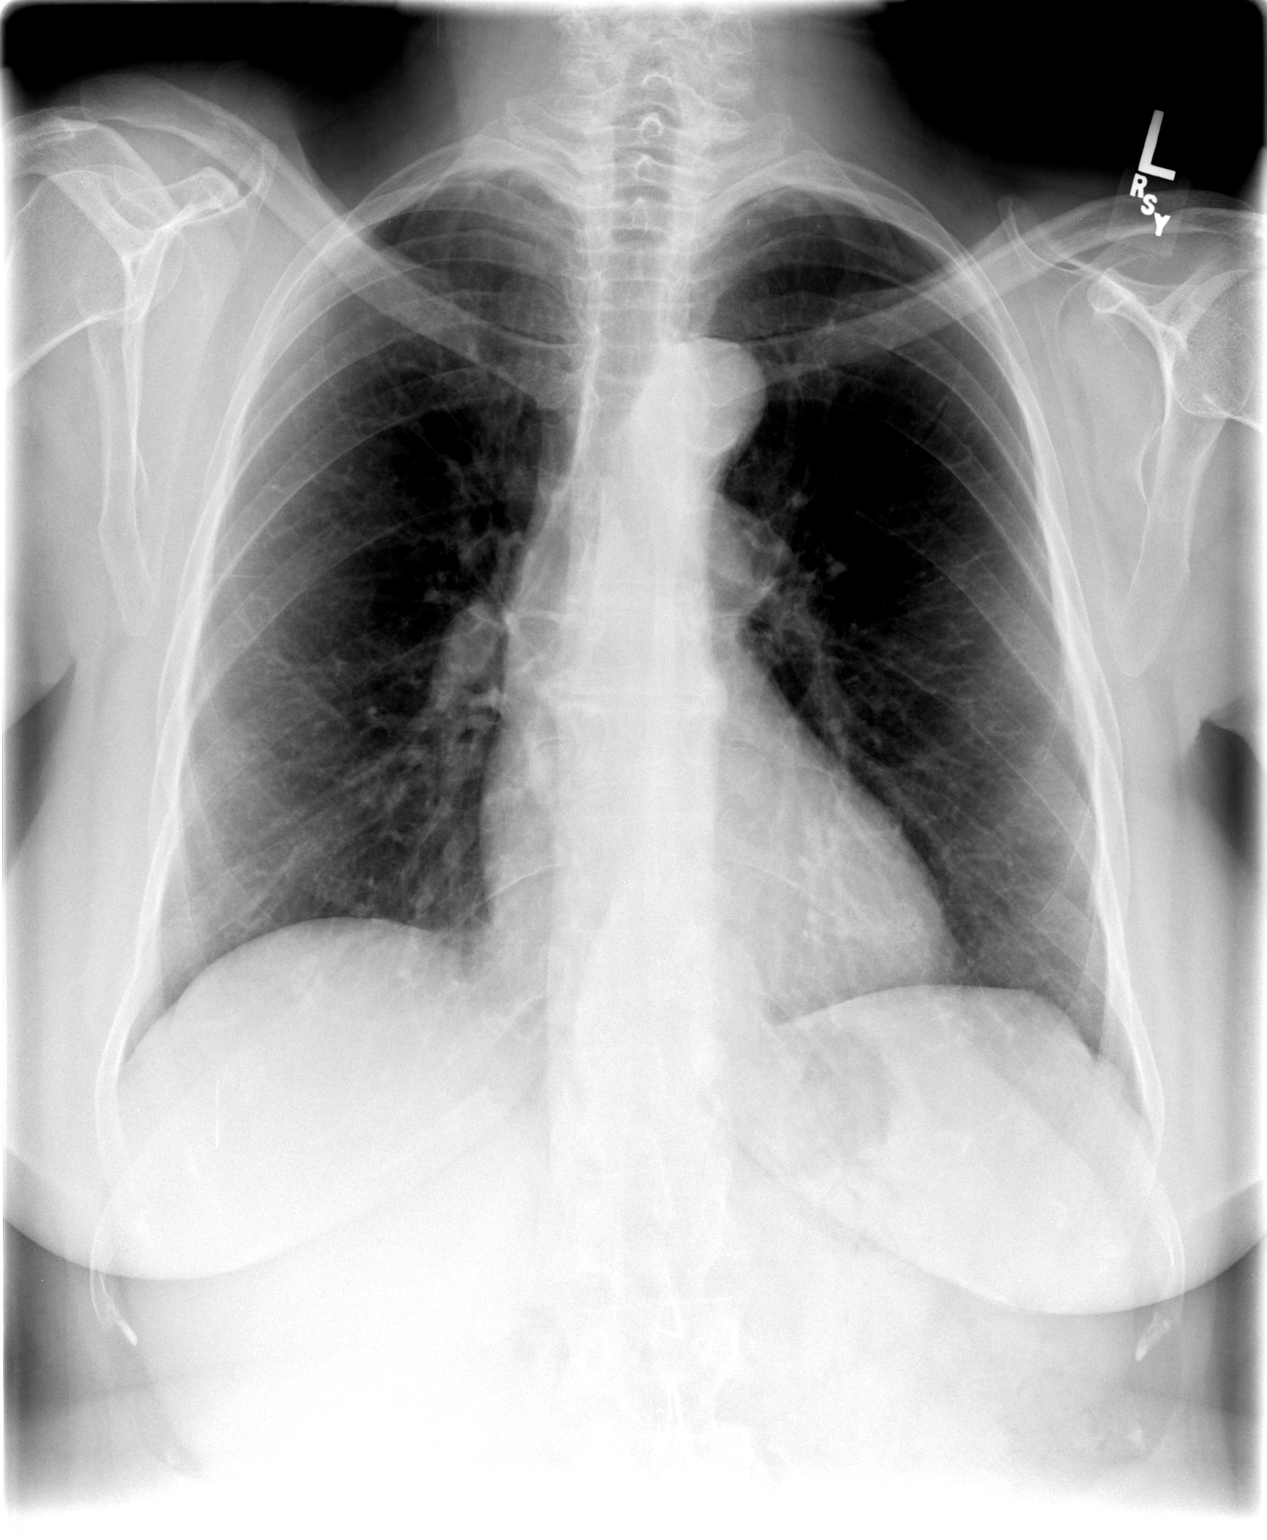

[view not recorded (2 of 2)]
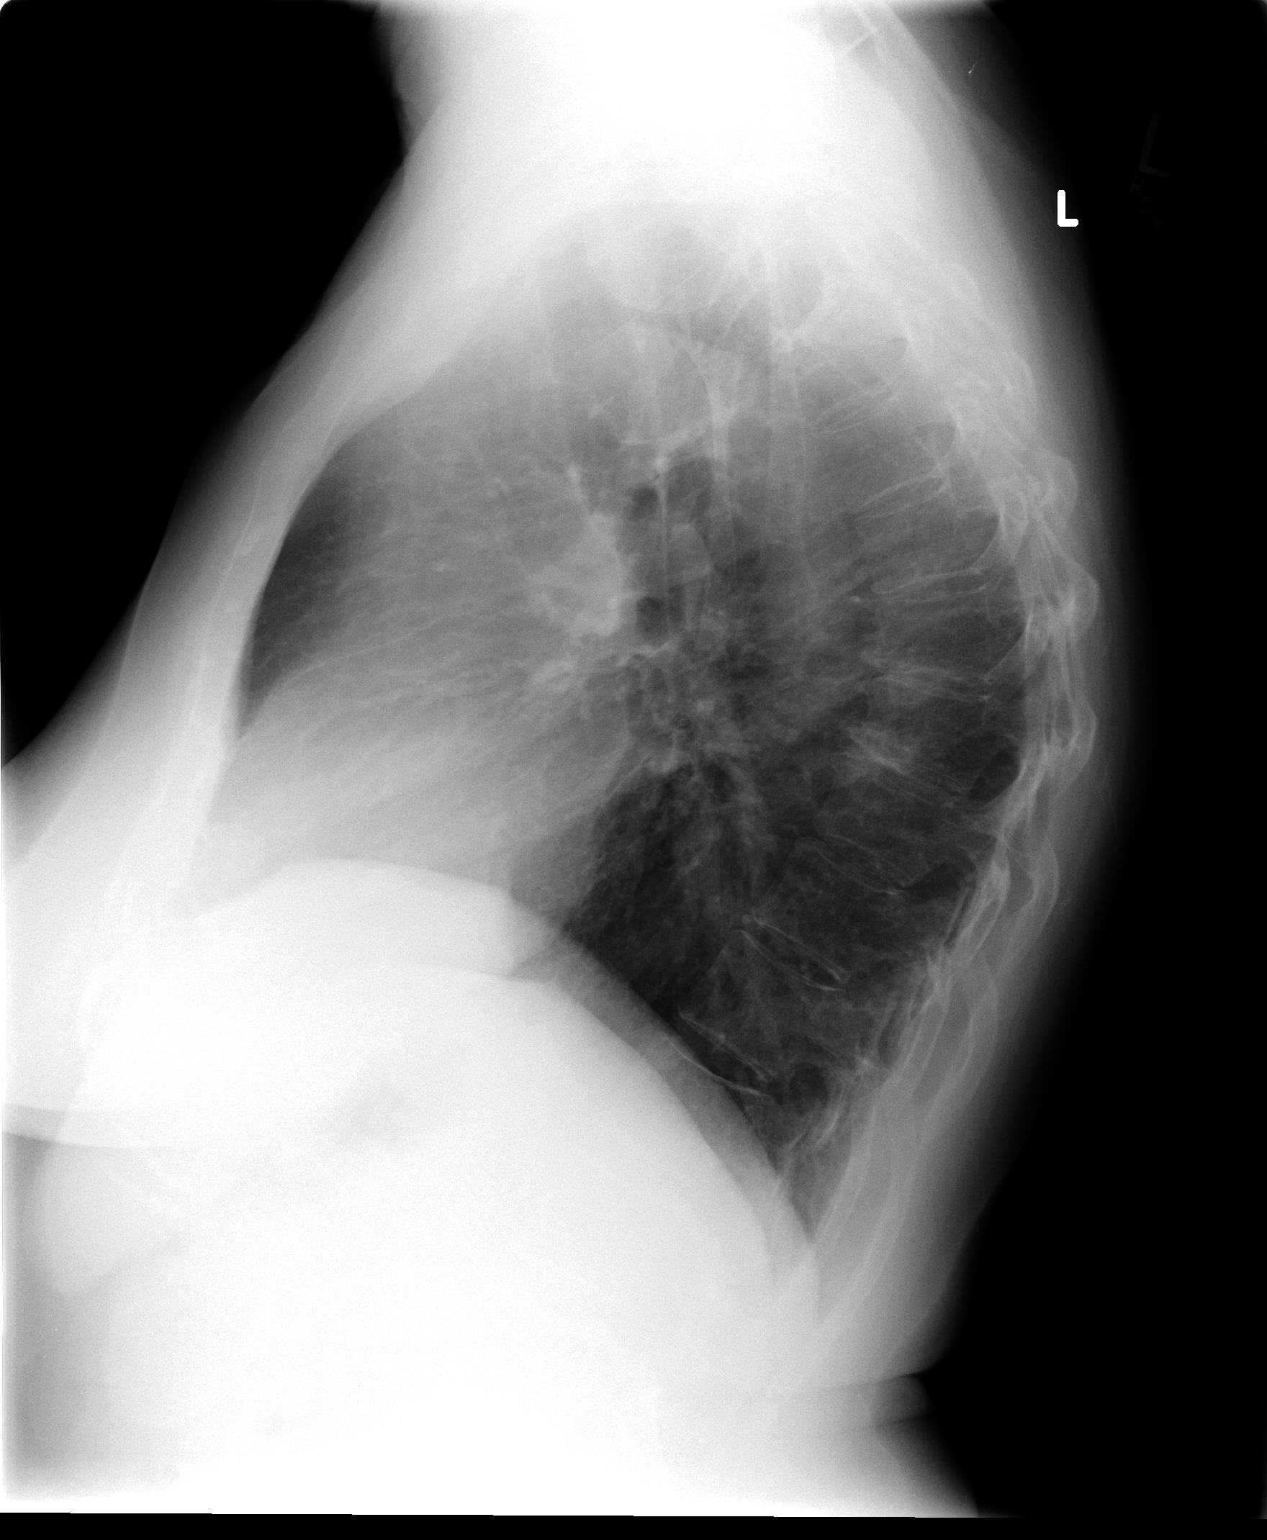

[2 of 2 positions shown; findings below may reference images not displayed]

FINDINGS: Since [DATE] lung bases are clear.  Stable
generalized prominence bronchopulmonary markings of slight chronic
bronchitis or asthma noted.  Lungs are otherwise clear.  Heart size
is normal.  Small hiatus hernia is similar to CT finding
[DATE].  Thoracic aortic arch is slightly calcified with dorsal
osteopenia and slight wedging mid dorsal spine unchanged.  No new
significant abnormality identified.
IMPRESSION: 1.  Stable slight asthma or chronic bronchitis.
2.  Small hiatus hernia.
3.  No active cardiopulmonary disease.

## 2008-05-05 ENCOUNTER — Ambulatory Visit: Admission: RE | Admit: 2008-05-05 | Discharge: 2008-05-05 | Payer: Self-pay | Admitting: Internal Medicine

## 2008-10-06 ENCOUNTER — Encounter: Admission: RE | Admit: 2008-10-06 | Discharge: 2008-10-06 | Payer: Self-pay | Admitting: Internal Medicine

## 2008-10-06 IMAGING — CR DG CHEST 2V
2 series · 2 of 2 positions shown · non-contrast
Comparison: [DATE]

CLINICAL DATA: Back and chest pain

CHEST - 2 VIEW

[view not recorded (1 of 2)]
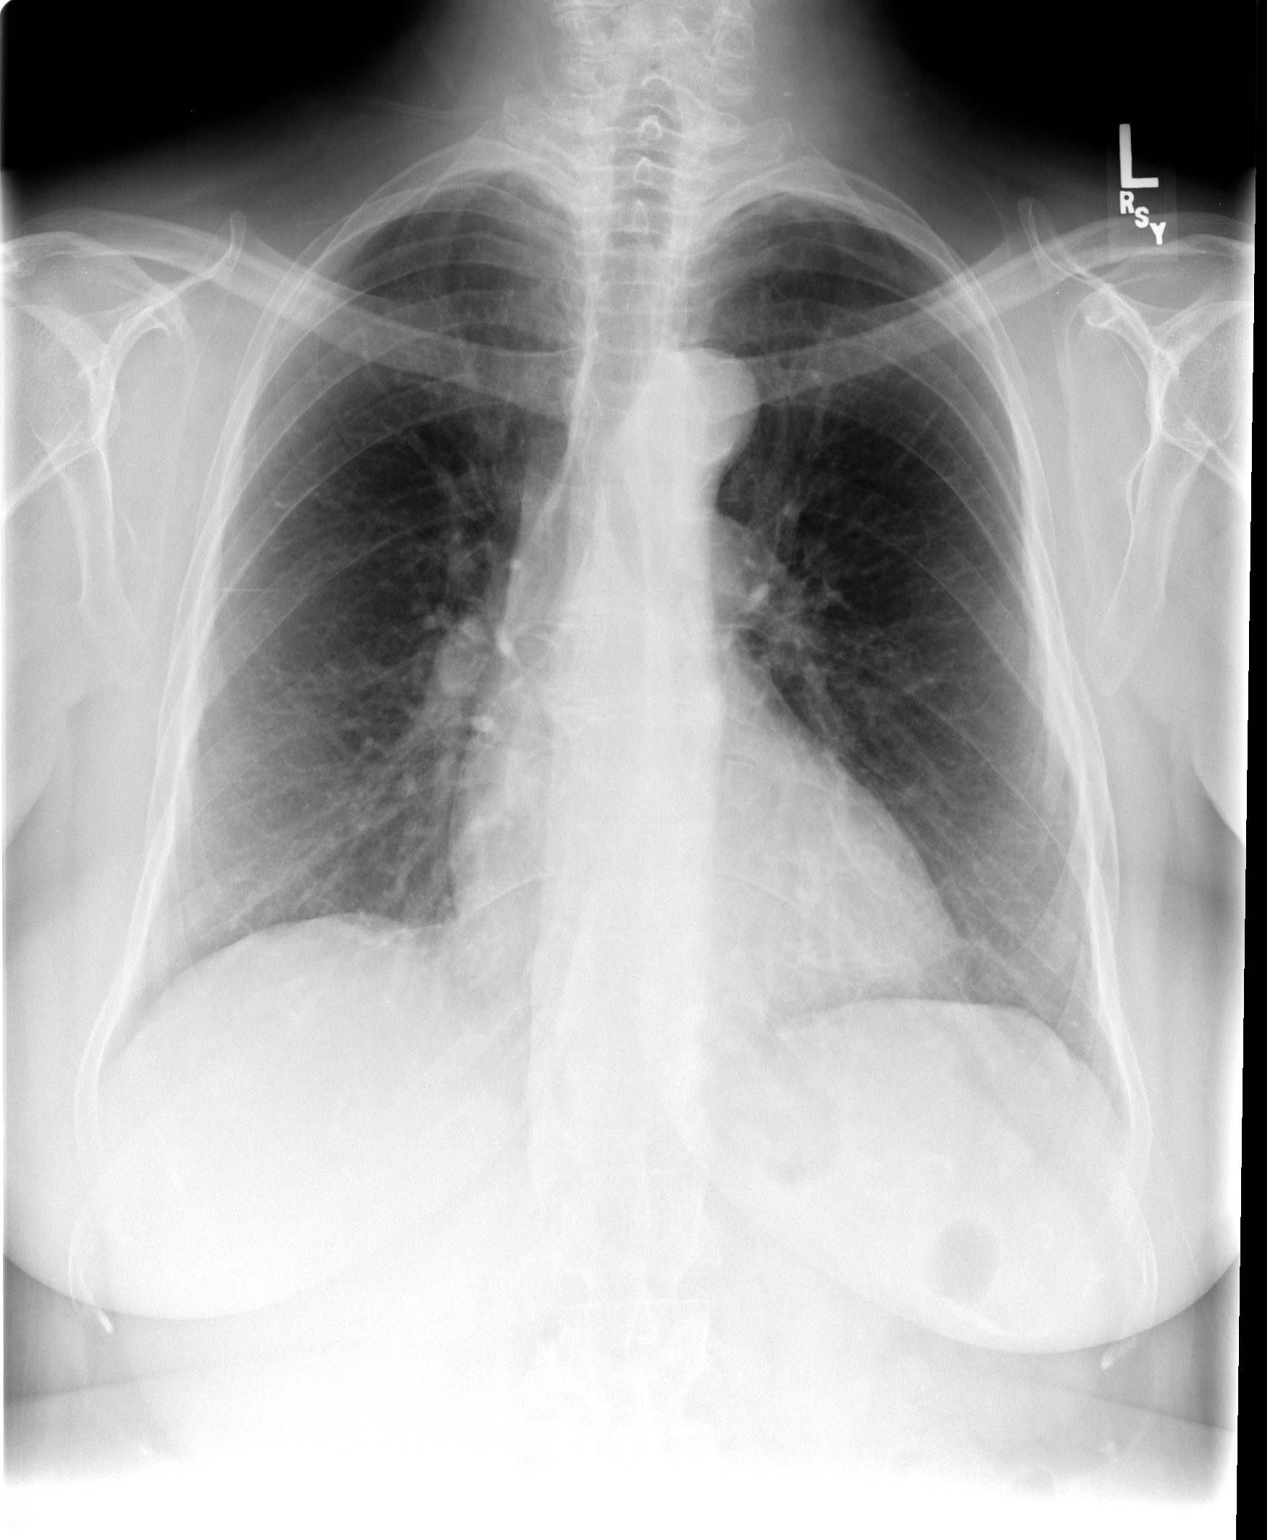

[view not recorded (2 of 2)]
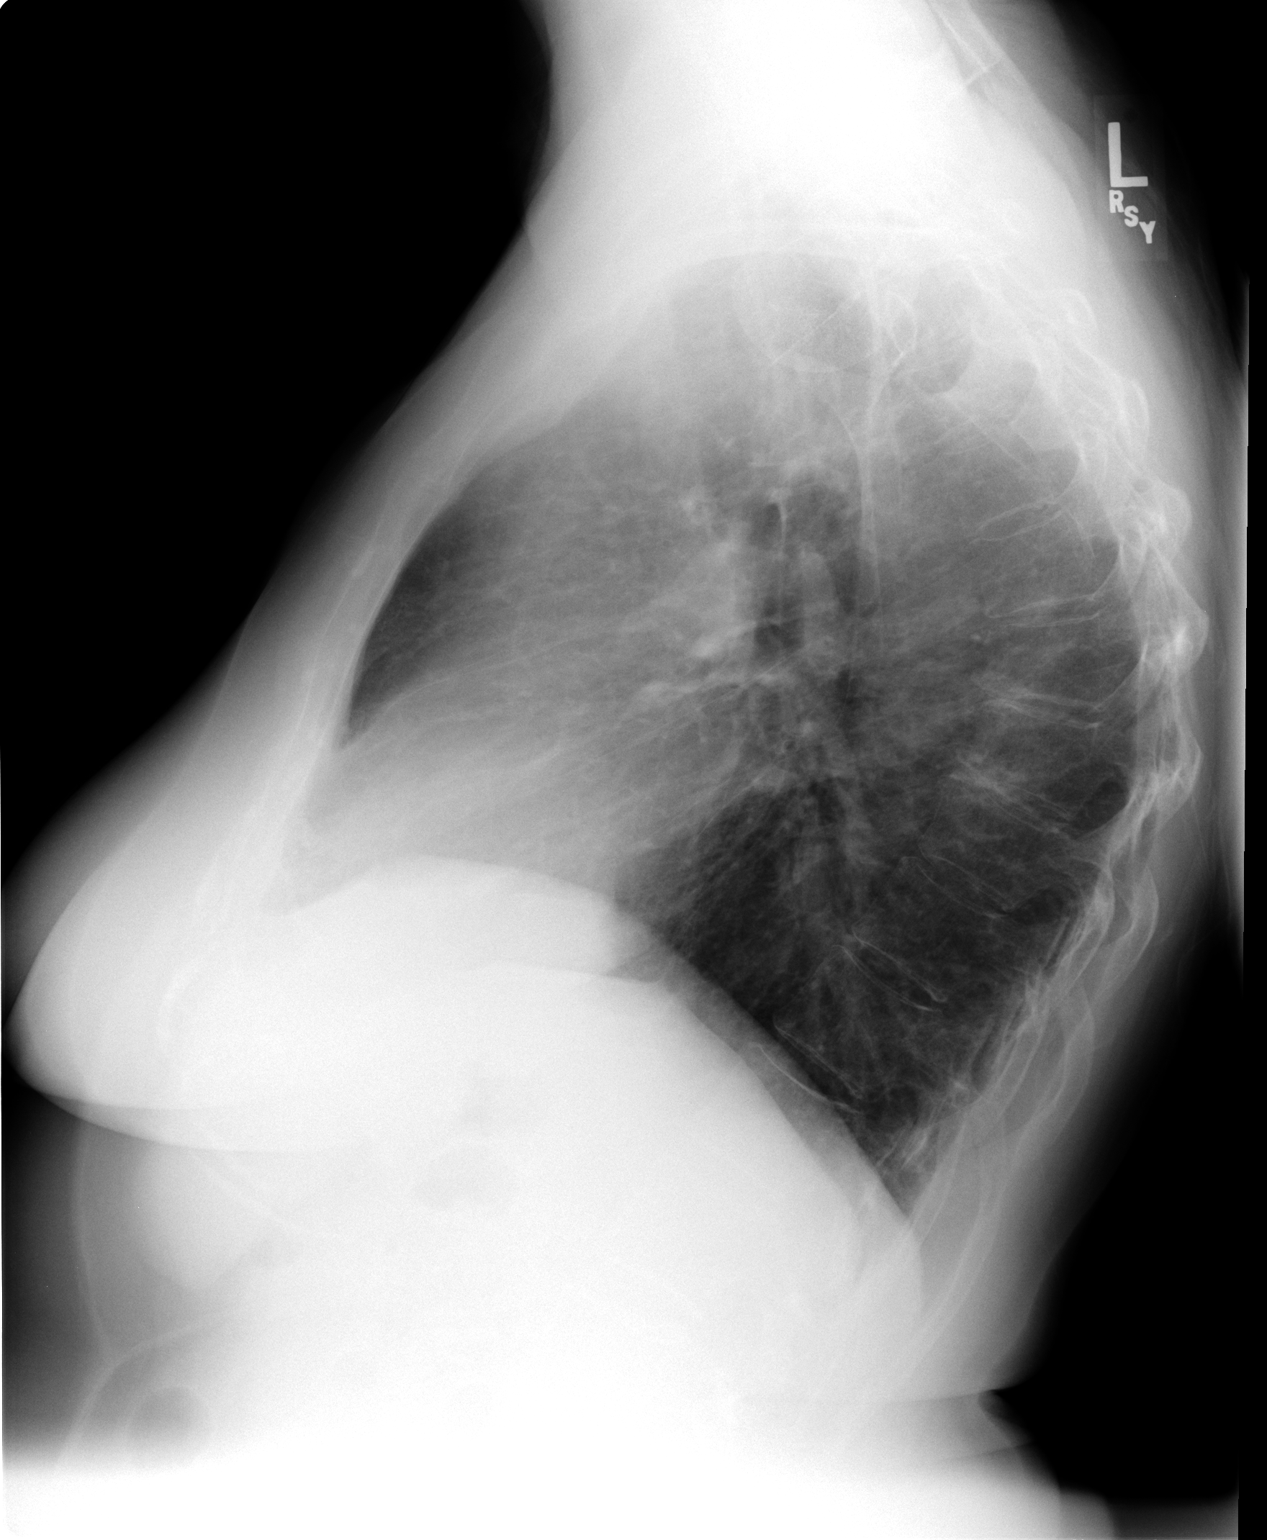

[2 of 2 positions shown; findings below may reference images not displayed]

FINDINGS: Heart size upper limits normal.  Mild spondylitic changes
in the mid thoracic spine stable.  Somewhat coarse pulmonary
bronchovascular markings as before without confluent infiltrate or
overt interstitial edema.  No effusion.
IMPRESSION: 1.  Chronic changes as above.  No acute or superimposed
abnormality.

## 2009-03-31 ENCOUNTER — Encounter: Admission: RE | Admit: 2009-03-31 | Discharge: 2009-03-31 | Payer: Self-pay | Admitting: Internal Medicine

## 2009-06-11 ENCOUNTER — Encounter (INDEPENDENT_AMBULATORY_CARE_PROVIDER_SITE_OTHER): Payer: Self-pay | Admitting: *Deleted

## 2009-07-14 ENCOUNTER — Encounter (INDEPENDENT_AMBULATORY_CARE_PROVIDER_SITE_OTHER): Payer: Self-pay | Admitting: *Deleted

## 2009-07-15 ENCOUNTER — Ambulatory Visit: Payer: Self-pay | Admitting: Internal Medicine

## 2009-07-21 ENCOUNTER — Emergency Department (HOSPITAL_COMMUNITY): Admission: EM | Admit: 2009-07-21 | Discharge: 2009-07-21 | Payer: Self-pay | Admitting: Family Medicine

## 2009-07-29 ENCOUNTER — Ambulatory Visit: Payer: Self-pay | Admitting: Internal Medicine

## 2010-03-21 ENCOUNTER — Encounter (INDEPENDENT_AMBULATORY_CARE_PROVIDER_SITE_OTHER): Payer: Self-pay | Admitting: *Deleted

## 2010-03-31 ENCOUNTER — Encounter: Admission: RE | Admit: 2010-03-31 | Discharge: 2010-03-31 | Payer: Self-pay | Admitting: Internal Medicine

## 2010-04-05 ENCOUNTER — Encounter: Admission: RE | Admit: 2010-04-05 | Discharge: 2010-04-05 | Payer: Self-pay | Admitting: Internal Medicine

## 2010-04-05 IMAGING — CR DG TIBIA/FIBULA 2V*L*
4 series · 4 of 4 positions shown · non-contrast
Comparison: None.

CLINICAL DATA: Left lower leg pain and tenderness medially.  No
known injuries.

LEFT TIBIA AND FIBULA - 2 VIEW [DATE]:

[view not recorded (1 of 4)]
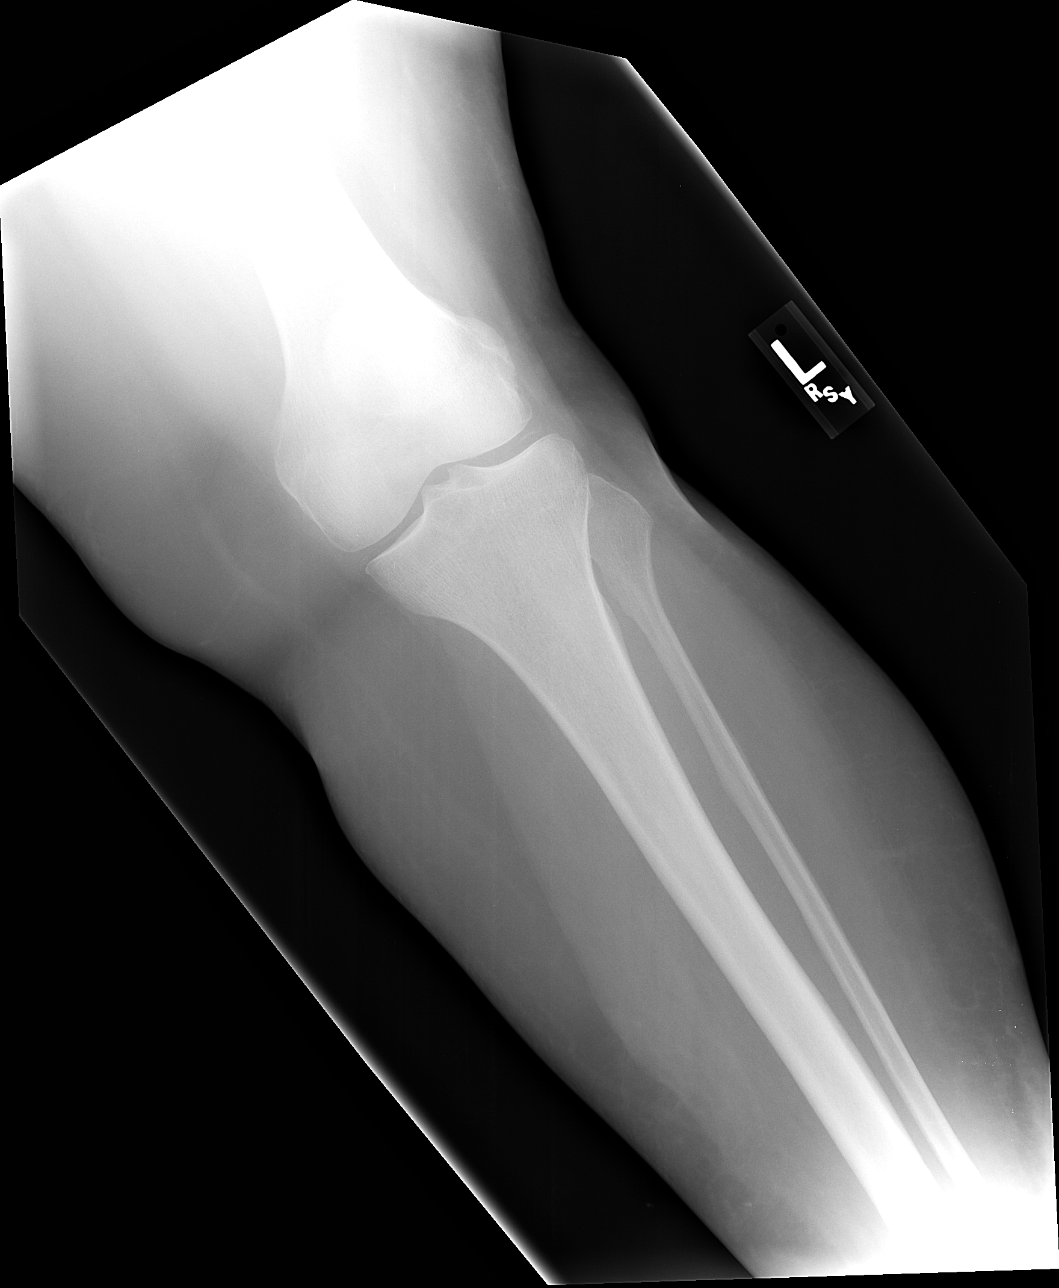

[view not recorded (2 of 4)]
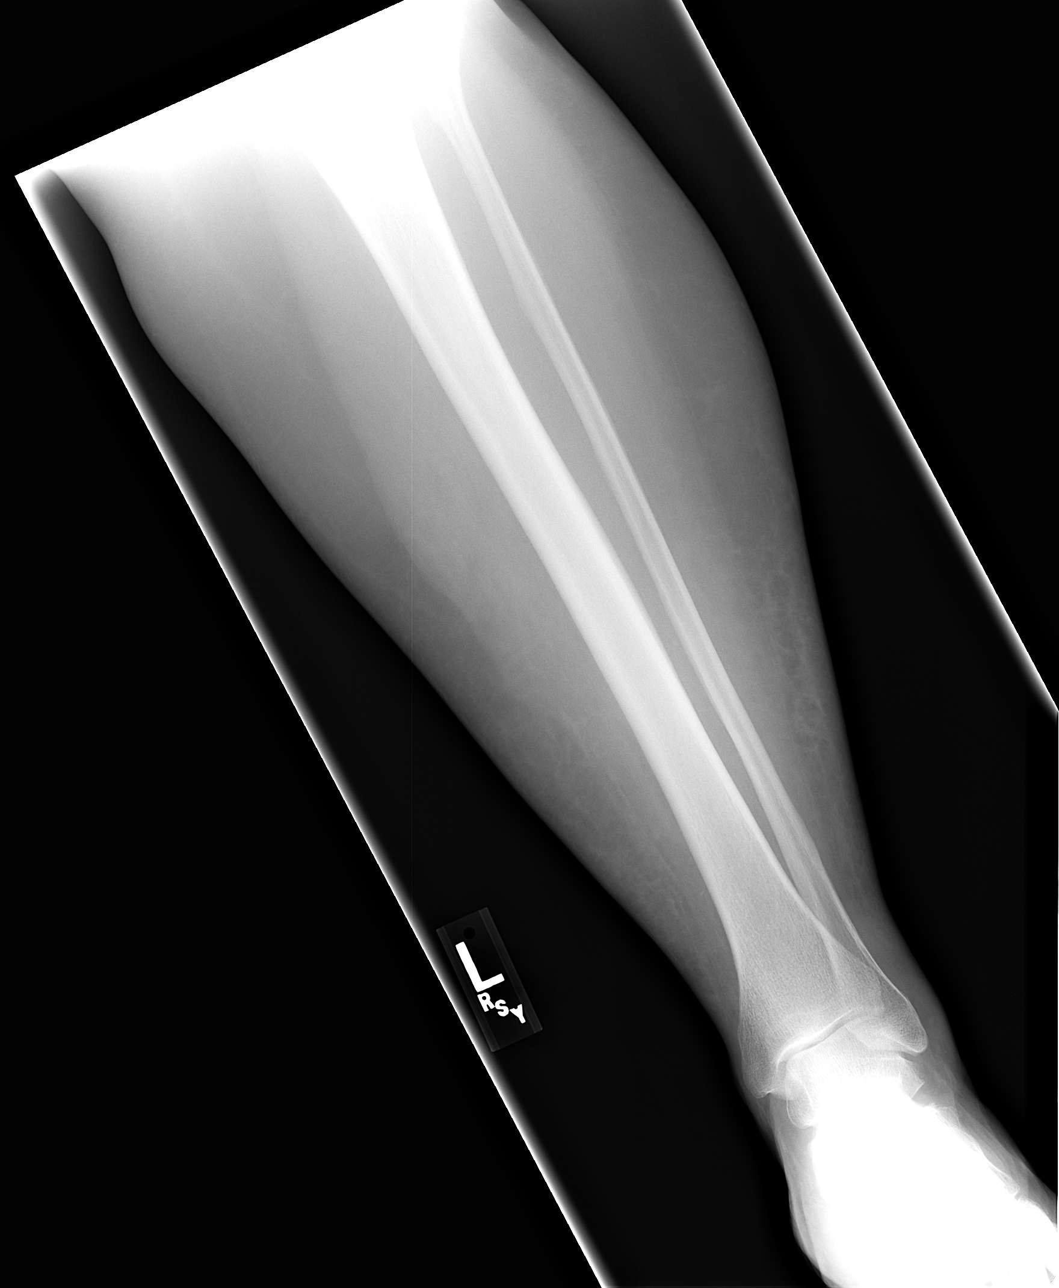

[view not recorded (3 of 4)]
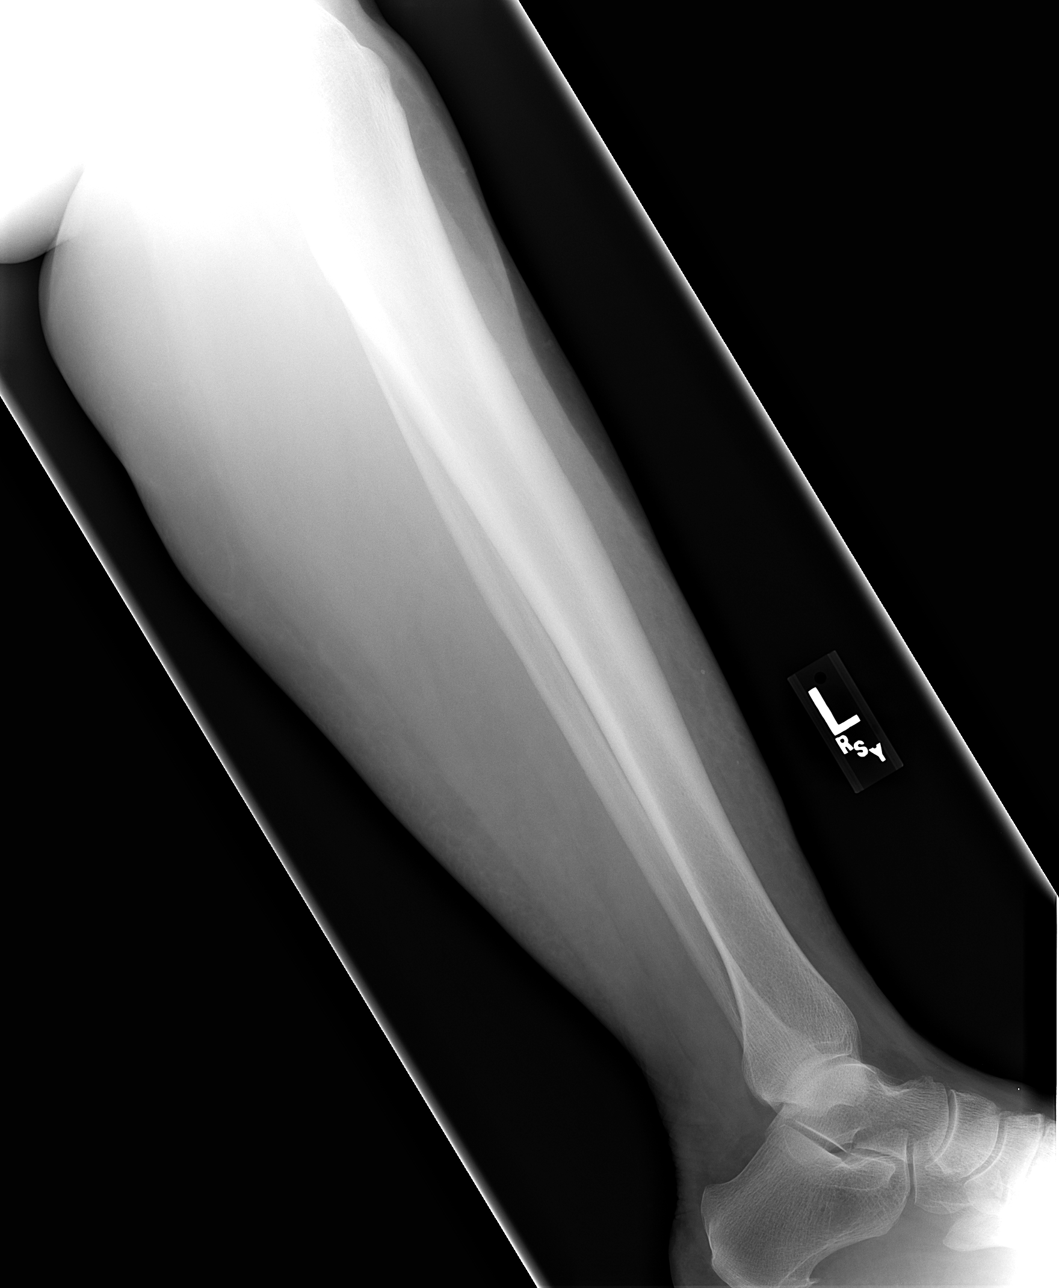

[view not recorded (4 of 4)]
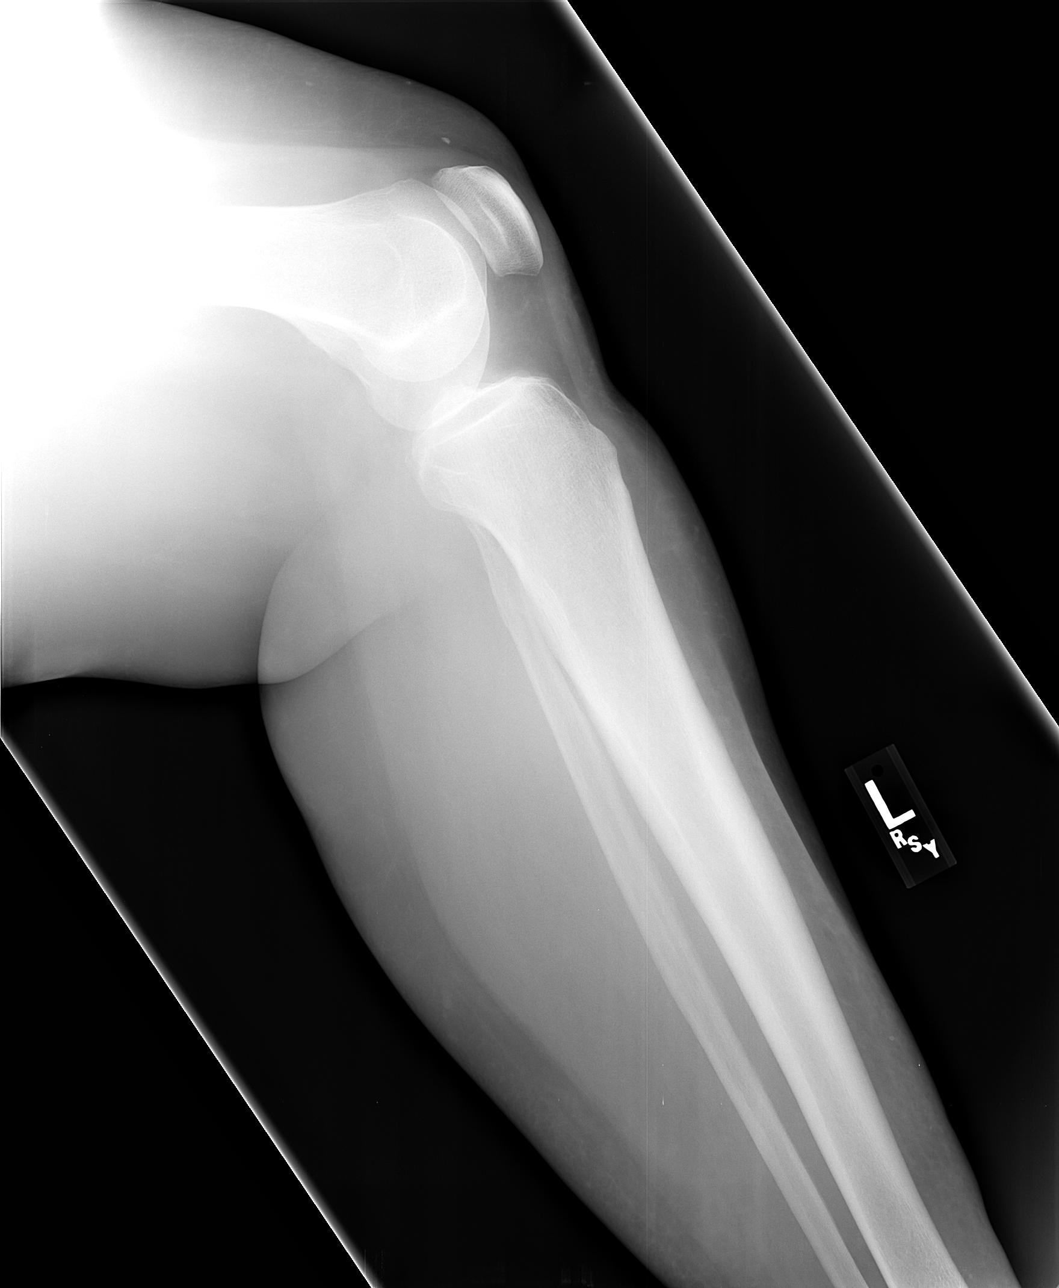

[4 of 4 positions shown; findings below may reference images not displayed]

FINDINGS: No acute fractures involving the tibia or fibula.  Well-
preserved bone mineral density for age.  No intrinsic osseous
abnormality.  Visualized ankle joint intact.  Chondrocalcinosis
noted involving the lateral and medial menisci of the knee.
IMPRESSION: Normal tibia and fibula.  CPPD involving the knee joint.

## 2010-04-08 ENCOUNTER — Encounter: Admission: RE | Admit: 2010-04-08 | Discharge: 2010-04-08 | Payer: Self-pay | Admitting: Internal Medicine

## 2010-04-08 IMAGING — US US EXTREM LOW VENOUS*L*
1 series · 14 of 24 positions shown · non-contrast
Comparison: None available.

CLINICAL DATA: Left leg pain, swelling

LEFT LOWER EXTREMITY VENOUS DUPLEX ULTRASOUND
TECHNIQUE: Gray-scale sonography with graded compression, as well
as color Doppler and duplex ultrasound were performed to evaluate
the deep venous system of the lower extremity from the level of the
common femoral vein through the popliteal and proximal calf veins.
Spectral Doppler was utilized to evaluate flow at rest and with
distal augmentation maneuvers.

[Series 1: us extrem low venous*left* · 14 of 30 slices shown]
[im 1/30]
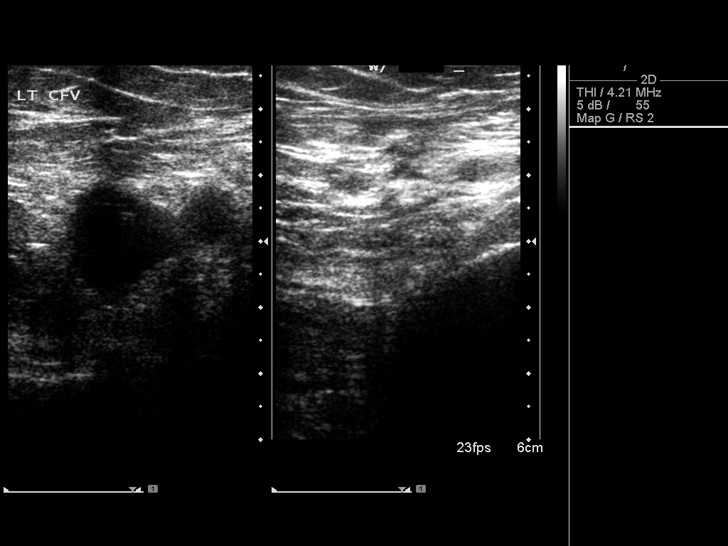
[im 3/30]
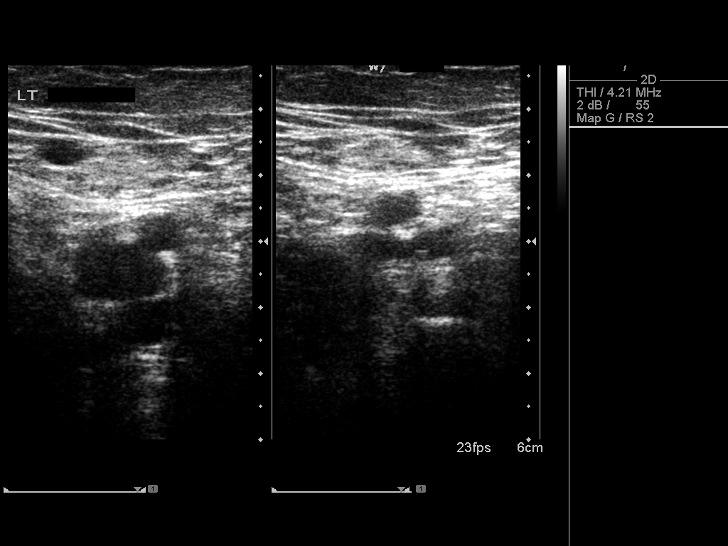
[im 6/30]
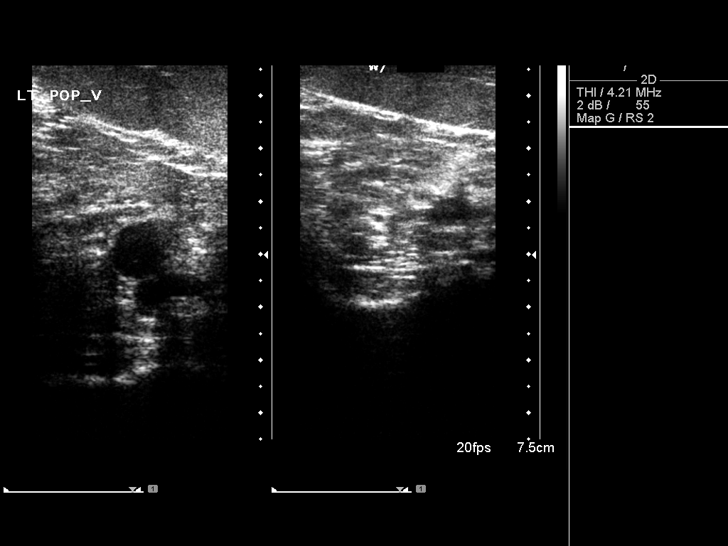
[im 8/30]
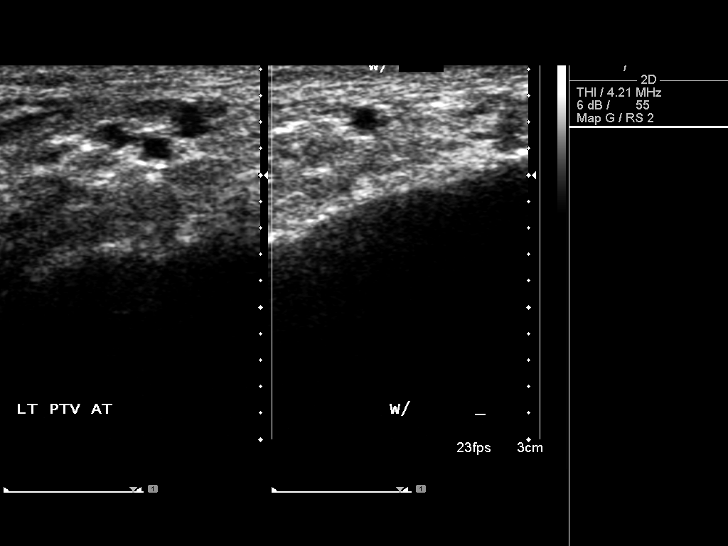
[im 9/30]
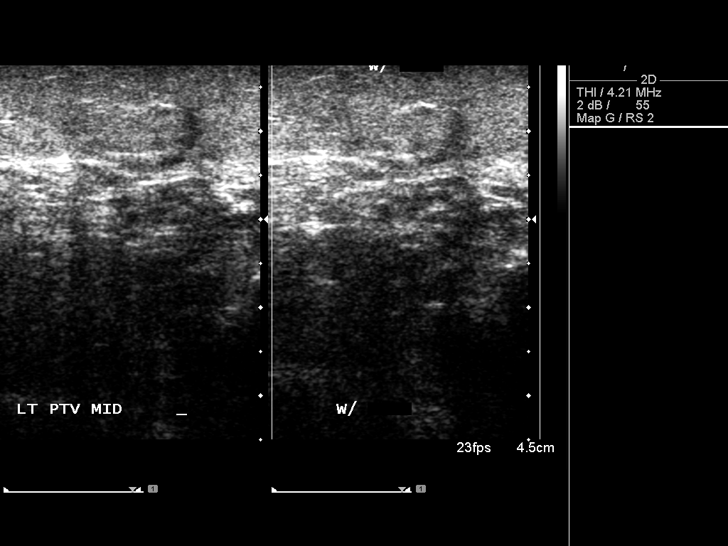
[im 12/30]
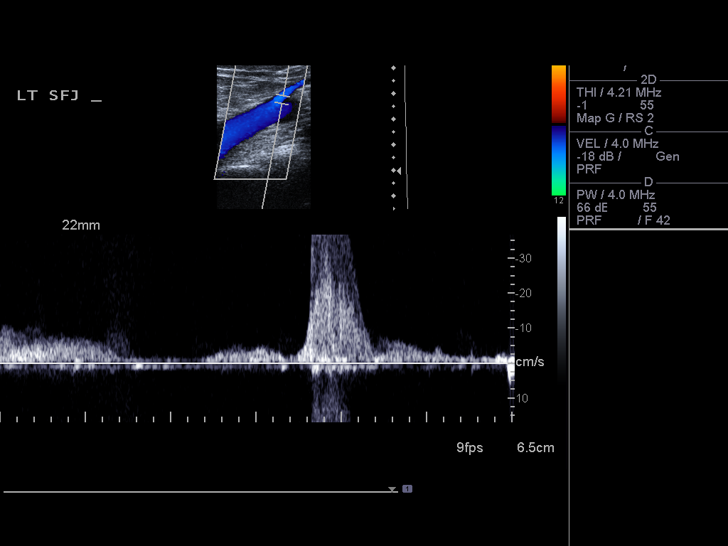
[im 14/30]
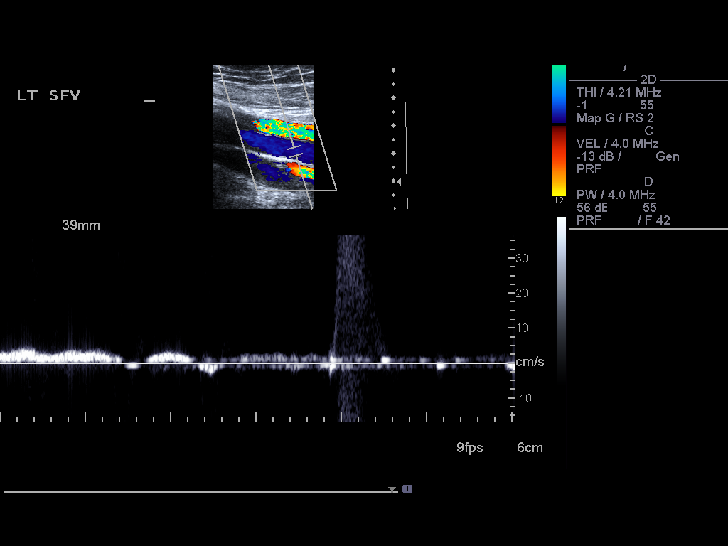
[im 16/30]
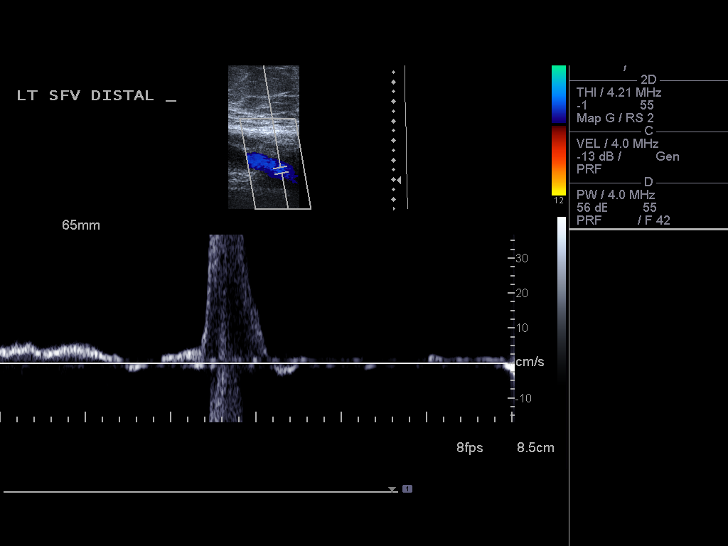
[im 18/30]
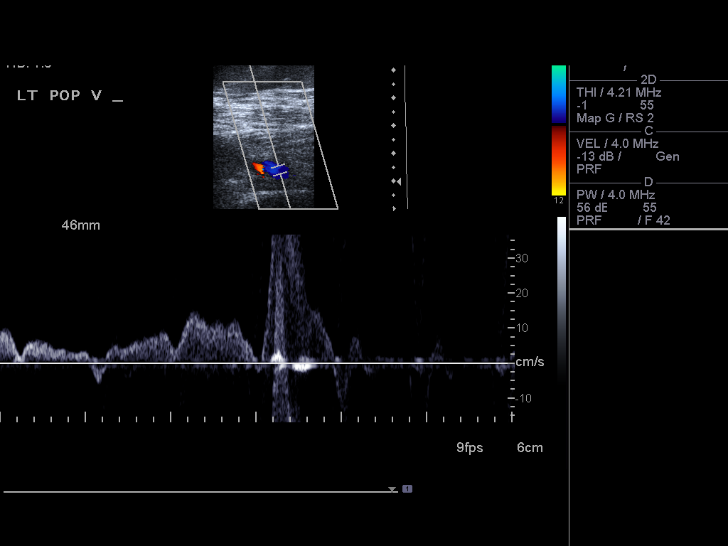
[im 21/30]
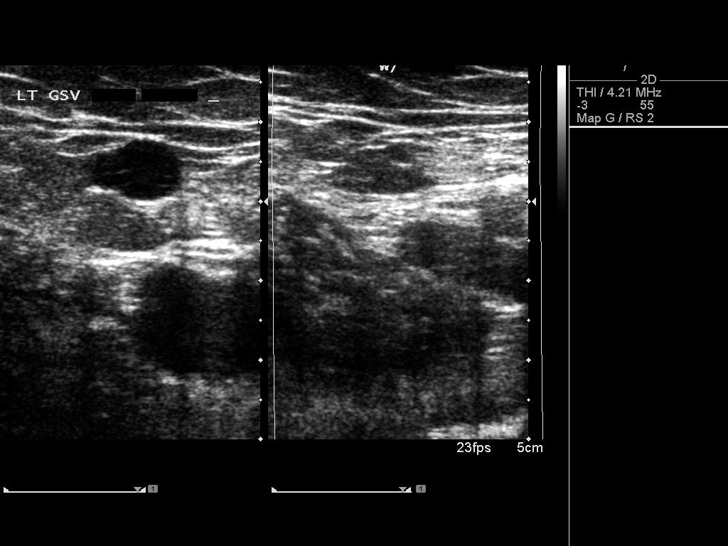
[im 23/30]
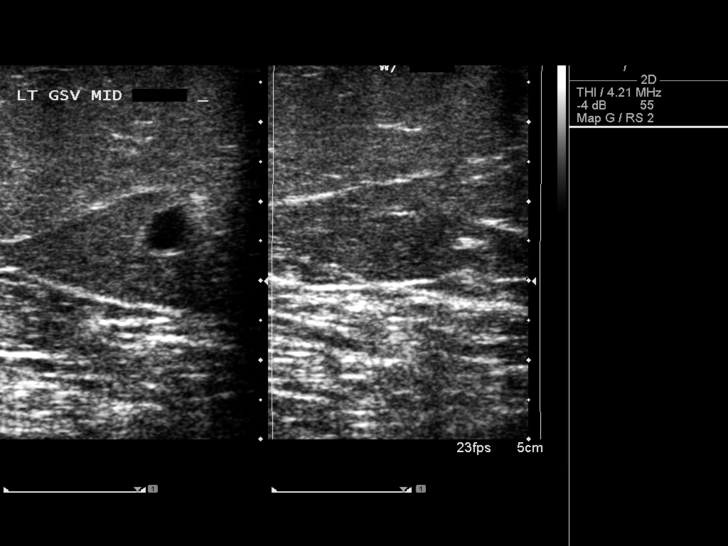
[im 24/30]
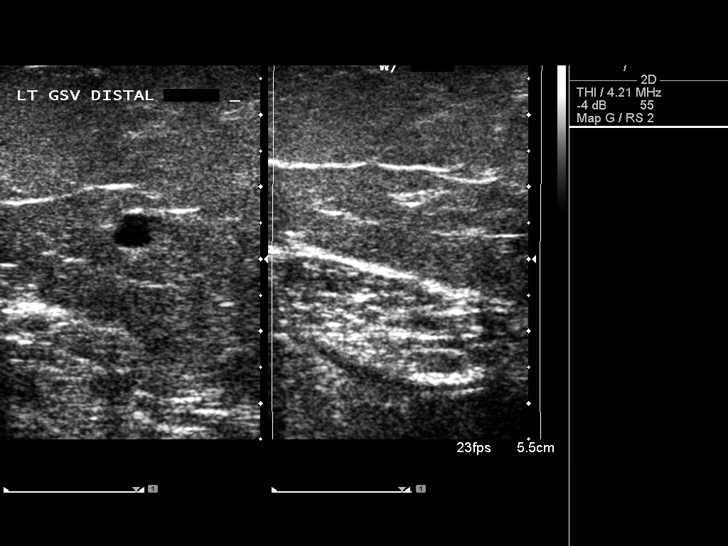
[im 27/30]
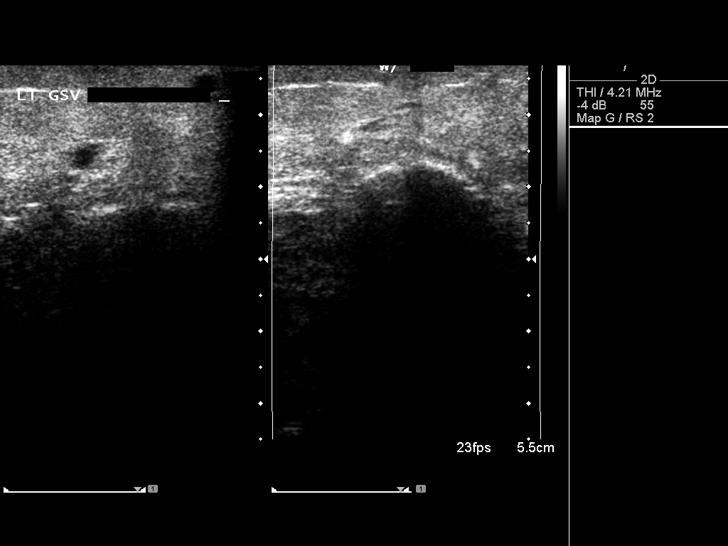
[im 30/30]
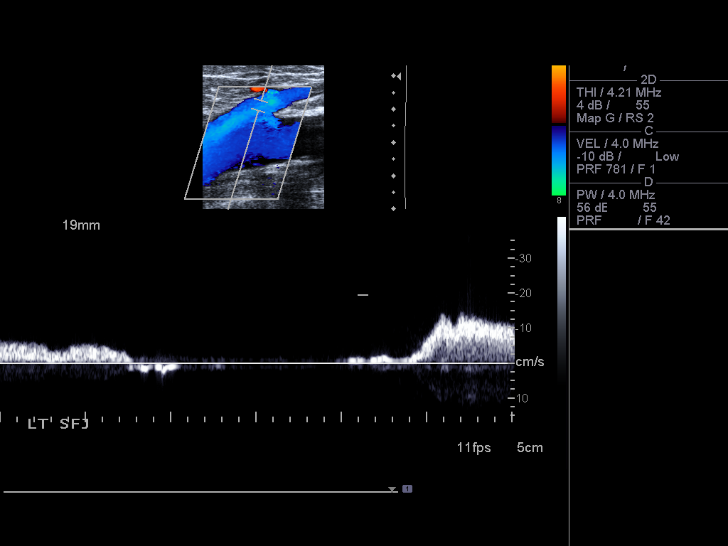

[14 of 24 positions shown; findings below may reference images not displayed]

FINDINGS: Normal compressibility of the common femoral,
superficial femoral, and popliteal veins is demonstrated, as well
as the visualized proximal calf veins.  No filling defects to
suggest DVT on grayscale or color Doppler imaging.  Doppler
waveforms show normal direction of venous flow, normal respiratory
phasicity and response to augmentation.
IMPRESSION: No evidence of lower extremity deep vein thrombosis.

## 2010-06-06 ENCOUNTER — Encounter: Admission: RE | Admit: 2010-06-06 | Discharge: 2010-06-06 | Payer: Self-pay | Admitting: Internal Medicine

## 2010-09-03 ENCOUNTER — Encounter: Payer: Self-pay | Admitting: Internal Medicine

## 2010-09-13 NOTE — Letter (Signed)
Summary: Colonoscopy Letter  [facility]  [address] [place], [place] [address]   Phone: [phone]  Fax: [phone]      March 21, 2010 MRN: [id number]   [name] [address] [place], [place]  [address]   Dear Ms. [name],   According to your medical record, it is time for you to schedule a Colonoscopy. The American Cancer Society recommends this procedure as a method to detect early colon cancer. Patients with a family history of colon cancer, or a personal history of colon polyps or inflammatory bowel disease are at increased risk.  This letter has been generated based on the recommendations made at the time of your procedure. If you feel that in your particular situation this may no longer apply, please contact our office.  Please call our office at [phone] to schedule this appointment or to update your records at your earliest convenience.  Thank you for cooperating with [place] to provide you with the very best care possible.   Sincerely,  Hedwig Morton. Juanda Chance, M.D.  [facility] [phone]

## 2010-10-23 ENCOUNTER — Inpatient Hospital Stay (INDEPENDENT_AMBULATORY_CARE_PROVIDER_SITE_OTHER)
Admission: RE | Admit: 2010-10-23 | Discharge: 2010-10-23 | Disposition: A | Discharge status: Home / Self Care | Payer: Medicare Other | Source: Ambulatory Visit | Attending: Emergency Medicine | Admitting: Emergency Medicine

## 2010-10-23 DIAGNOSIS — T6391XA Toxic effect of contact with unspecified venomous animal, accidental (unintentional), initial encounter: Secondary | ICD-10-CM

## 2010-12-27 NOTE — H&P (Signed)
NAMECHRISTYANNA, [name] NO.:  [id number]   MEDICAL RECORD NO.:  [id number]          PATIENT TYPE:  EMS   LOCATION:  ED                           FACILITY:  [facility]   PHYSICIAN:  Lonia Blood, M.D.DATE OF BIRTH:  12-Feb-1946   DATE OF ADMISSION:  03/08/2008  DATE OF DISCHARGE:                              HISTORY & PHYSICAL   PRIMARY CARE PHYSICIAN:  Ralene Ok, M.D.   CHIEF COMPLAINT:  Shortness of breath, chest pain and palpitations.   HISTORY OF PRESENT ILLNESS:  [name] is a very pleasant 65-  year-old female who presents to the hospital for complaints of shortness  of breath, chest pain and palpitations.  She states that approximately 2  weeks ago she began to experience the significant shortness of breath.  This was associated with intermittent feeling of chest type pressure in  the right chest and a tightness sometimes in the throat.  This was tough  to distinguish from her severe chronic gastroesophageal reflux disease  and hiatal hernia.  Nonetheless, the symptoms were much worse than she  had previously experienced.  She presented to the emergency room for  evaluation on February 27, 2008.  At that time the chest x-ray revealed a  right lower lobe pneumonia and the patient was placed on Omnicef.  Multiple days passed and the patient was compliant with her Omnicef but  her symptoms continued.  She therefore presented to a different  physician at recommendation of her family.  This physician added  prednisone to the patient's regimen in a short taper.  The patient was  compliant with her prednisone taper and completed this full course  yesterday.  Despite appropriate use of prednisone the patient's symptoms  of intermittent shortness of breath and tightness in the center and  right chest did not improve.  She therefore presented to her primary  care physician who placed her on Avelox.  This was approximately 3 days  ago.  Despite all of these  interventions the patient's shortness of  breath has continued.  She states that it is intermittent but it seems  to be worsening overall.  She states that she suffered significant  episodes of wheeze oftentimes with the shortness of breath and chest  tightness.  Out of concern that her symptoms seemed to be persistent  without significant improvement and in fact with progression the patient  presented to the emergency room for evaluation today.   REVIEW OF SYSTEMS:  The patient has chronic low back pain associated  with a herniated disk which has been injected multiple times in the  past.  As noted above she has frequent difficulty with reflux and a  small hiatal hernia which she usually is able to well control with her  Nexium therapy.  Comprehensive review of systems is otherwise  unremarkable.   PAST MEDICAL HISTORY:  1. Hypertension.  2. Gastroesophageal reflux disease with hiatal hernia.  3. Hypercholesterolemia.  4. Osteoarthritis/osteoporosis.  5. 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   OUTPATIENT MEDICATIONS:  Doses unknown:  1. Lipitor daily at bedtime.  2. Multivitamin daily.  3. Nexium daily.  4. Aspirin daily.  5. Actonel q. month on the 13th.  6. Albuterol MDI p.r.n.   ALLERGIES:  The patient reports allergy to IV DYE.   FAMILY HISTORY:  Noncontributory.   SOCIAL HISTORY:  The patient does not smoke.  She does not drink.  She  is a widow.   DATA REVIEW:  White count, platelet count and hemoglobin are normal with  an MCV that is normal.  BNP is less than 30.  BMET is unremarkable with  exception of elevated BUN at 25.  Point of care cardiac markers are  negative times one.  Chest x-ray reveals findings consistent with COPD  but no acute infiltrate.   PHYSICAL EXAMINATION:  VITAL SIGNS:  Temperature 97.8, blood pressure  110/79, heart rate 59, respiratory rate 28, O2 sat was 98% on room air.  GENERAL:  A frail female  who appears older than her stated age but is in  no acute respiratory distress at the present time.  HEENT:  Normocephalic, atraumatic.  Pupils equal, round and reactive to  light and accommodation.  Extraocular muscles intact bilaterally.  OC/OP  clear.  NECK:  No JVD.  No lymphadenopathy.  LUNGS:  Poor air movement throughout all fields but no focal crackles  are appreciated.  There is a very mild expiratory wheeze but again  breath sounds are minimal throughout.  CARDIOVASCULAR:  Distant but regular without gallop or rub, with normal  S1 and S2.  ABDOMEN:  Nontender, nondistended, soft bowel sounds present.  No  hepatosplenomegaly, no rebound, no ascites.  EXTREMITIES:  2+ bilateral lower extremity edema without cyanosis or  clubbing.  NEUROLOGIC:  Alert and oriented.  Cranial nerves II-XII intact  bilaterally, 5/5 strength bilateral upper and lower extremities, intact  sensation to touch throughout.   IMPRESSION AND PLAN:  1. Sign and symptom complex - the patient presents with a      constellation of symptoms to include shortness of breath, chest      pain/tightness and palpitations.  The symptoms are not consistent      with a true unstable angina pectoris.  She has minimal risk factors      for such.  We will go ahead and cycle enzymes to provide further      assurance of this as I am not exactly sure of the patient's symptom      source at the present time.  We will also obtain an echocardiogram      to evaluate LV function given the patient's lower extremity edema      despite her presently normal BNP.  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  We will check a TSH to      assure that the patient's thyroid function is normal.  We will      continue albuterol and Atrovent nebs.  We will add Pulmicort nebs.      I will not add systemic steroids as the patient has recently       completed a taper.  This likely has exacerbated any anxiety that      she may be experiencing.  We will follow the patient's clinical      symptoms and pursue further evaluations as indicated.  2. Hypertension - the patient has a  history of hypertension.  She is      not currently on any antihypertensives.  We will follow her blood      pressure trend during her hospital stay.  3. Gastroesophageal reflux disease with hiatal hernia - there is      significant probability that the patient's symptoms      could be related to flares of her hiatal hernia.  We will use      Protonix during her hospital stay and monitor symptom complex.  4. Hypercholesterolemia - we will continue Lipitor and we will check      LFTs in the morning.      Lonia Blood, M.D.  Electronically Signed     JTM/MEDQ  D:  03/08/2008  T:  03/08/2008  Job:  [id number]

## 2010-12-27 NOTE — Discharge Summary (Signed)
[name], [name]            ACCOUNT NO.:  [id number]   MEDICAL RECORD NO.:  [id number]          PATIENT TYPE:  INP   LOCATION:  1442                         FACILITY:  [facility]   PHYSICIAN:  Marcellus Scott, MD     DATE OF BIRTH:  1945/10/19   DATE OF ADMISSION:  03/08/2008  DATE OF DISCHARGE:  03/13/2008                               DISCHARGE SUMMARY   PRIMARY MEDICAL DOCTOR:  Ralene Ok, M.D.   DISCHARGE DIAGNOSES:  1. Right lower lobe community-acquired bronchopneumonia.  2. Right-sided atypical/pleuritic chest pain, resolved.  Myocardial      infarction ruled out.  3. 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  4. Hypertension.  5. Hyperlipidemia.  6. Rule out coronary artery disease.  Outpatient stress test being      scheduled by [facility].  7. Sinus bradycardia, resolved.  8. Anemia.   DISCHARGE MEDICATIONS:  1. Mucinex 600 mg p.o. b.i.d.  2. Avelox 400 mg p.o. daily for 3 days.  3. Motrin 400 mg t.i.d. with meals as needed for 3 days.  4. Senokot S 1 p.o. daily.  5. Combivent 1-2 puffs inhaled 4 times daily.  6. Actonel once per month.  7. Albuterol inhaler 2 puffs p.r.n.  8. Aspirin 81 mg p.o. daily.  9. Lipitor 40 mg p.o. daily.  10.Multivitamins 1 p.o. daily.  11.Nexium 40 mg p.o. daily.  12.Darvocet-N 100 1 every 6 hours p.r.n.   MEDICATIONS HELD:  1. Hycodan.  2. Prednisone which she has completed.  3. Diovan HCT.   PROCEDURES:  1. CT of the chest without contrast on March 12, 2008 impression:      Likely right lower lobe bronchopneumonia.  2. Portable chest x-ray on March 10, 2008 impression:  There is a new      area of right lower lobe airspace disease which could be      atelectasis or pneumonia.  3. Chest x-ray on March 08, 2008 impression:  Stable changes of COPD.      No acute abnormality.  4. Chest x-ray on March 06, 2008 impression:  Resolution of right lower      lobe pneumonia.  Minimal residue  of post-pneumonic atelectasis.  5. A 2-D echocardiogram on March 09, 2008:  Overall left ventricular      systolic function was normal.  Left ventricular ejection fraction      was estimated range being 55-65%.  This study was inadequate for      evaluation of left ventricular regional wall motion.  Left      ventricular wall thickness was mildly increased.  There was mild      focal basal septal hypertrophy.   LABORATORY DATA:  INR 1.  Cardiac panel cycled and negative.  BNP less  than 30.  CBC with hemoglobin 12.4, hematocrit 37, white blood cell 7.7,  platelets 286.  Basic metabolic panel normal with BUN 13, creatinine  0.77.  Urine culture 20,000 per mL.  Lipid panel with cholesterol 144,  triglycerides 146, HDL 33, LDL 82, VDL 29, homocystine 9.5, TSH 1.520.  Urinalysis not suggestive of an  urinary tract infection.  ABG on  admission was 7.434 pH, PCO2 39, PO2 76, bicarb 25, oxygen saturation  95.  D-dimer negative at 0.42.  Admitting BNP also less than 30.   CONSULTATIONS:  Cardiology: [facility], Dr.  Domingo Sep.   HOSPITAL COURSE/DISPOSITION:  Please refer to the history and physical  note for initial admission details.  In summary, [name] is a  very pleasant 65 year old Caucasian female patient with history of  hypertension, hyperlipidemia, osteoporosis, gastroesophageal reflux  disease, who presented with dyspnea, chest pain and palpitations.  She  is exposed to a significant amount of passive smoking at home with  almost all her family members being heavy smokers.  She was recently  treated for right lower lobe pneumonia on February 27, 2008 with a course of  Omnicef, but continued to have symptoms following which she was started  on Avelox by her primary medical doctor, but continued to have symptoms  with wheezing and chest tightness for which she came to the ED.  Evaluation in the emergency room showed that she was afebrile with  stable vital signs.   It was thought that the symptom complex of dyspnea,  chest pain, tightness and palpitations were more cardiac in nature.  She  was admitted to a telemetry bed.  1. Right lower lobe pneumonia.  The patient was not initially started      on antibiotics.  However, her chest x-ray was still suggestive of      right lower lobe pneumonic process which was subsequently confirmed      on CT noncontrast.  She has done quite well with Avelox with      resolution of pleuritic right-sided chest pain and decrease in her      sputum.  She has been afebrile. She is to complete a total of 1      weeks' course of Avelox.  She is to have a repeat chest x-ray in a      couple of weeks to ensure resolution of findings on the chest x-      ray.  2. Right-sided atypical pleuritic-type of chest pain.  The patient      kept complaining of right-sided chest pain. She was also slightly      tender to touch at that site.  She has significant risk factors for      coronary artery disease including strong family history of heart      disease with her father having had 4 MIs and subsequently dying of      the same.  Her brother dying in the mid 30s from an MI,      hyperlipidemia and exposure to passive smoking.  Thereby,      cardiology was invited in consult who agreed that her pain was      probably noncardiac.  In any event, they will arrange for an      outpatient stress test for further evaluation.  Her echo was      unremarkable.  MI was ruled out.  Her chest pain has resolved.  3. Chronic obstructive pulmonary disease.  The patient had mild      exacerbation.  She is not a smoker herself, but is exposed to      strong passive smoking.  She has been advised to avoid the passive      smoking and counsel her family members.  Currently, this is stable.  4. The patient had some bruising in  the lower abdomen secondary to      Lovenox which was discontinued.  She indicates that she bruises      easily.  Her INRs  were normal.  5. At this time, the patient is stable for discharge to be followed by      her primary medical doctor in the next 4-5      days.  Her Diovan HCT is temporarily on hold because her blood      pressures are quite well controlled in the hospital.  This until      she is further reviewed by her PMD.  Once physical therapy and      occupational therapy have evaluated her and made their      recommendations, the patient should be able to go home.      Marcellus Scott, MD  Electronically Signed     AH/MEDQ  D:  03/14/2008  T:  03/14/2008  Job:  [id number]   cc:   Ralene Ok, M.D.  Fax: [phone]   Dani Gobble, MD  Fax: [phone]

## 2010-12-30 NOTE — Discharge Summary (Signed)
[name], [name]            ACCOUNT NO.:  [id number]   MEDICAL RECORD NO.:  [id number]          PATIENT TYPE:  INP   LOCATION:  5037                         FACILITY:  [facility]   PHYSICIAN:  Burnard Bunting, M.D.    DATE OF BIRTH:  1945/10/31   DATE OF ADMISSION:  03/13/2006  DATE OF DISCHARGE:  03/15/2006                                 DISCHARGE SUMMARY   DISCHARGE DIAGNOSIS:  Left calf mass.   SECONDARY DIAGNOSES:  1. Osteoporosis.  2. Hypertension.   OPERATIONS/PROCEDURES:  Left calf mass excision, performed 03/13/2006.   HISTORY OF PRESENT ILLNESS:  The patient is a 65 year old female with a  painful left calf mass.  She underwent calf mass excision 03/13/2006.  She  tolerated the procedure well without any complication.  Motor and sensory  function was intact on postop day #1.  She was evaluated by physical  therapy, weight bearing as tolerated.  She is discharged home in good  condition on postop day #2.   The pathology on the mass was pending at the time of discharge.   DISCHARGE MEDICATIONS:  Include:  1. Calcium.  2. Multivitamin.  3. Aspirin 81 mg daily.  4. Actonel 35 mg weekly.  5. Lexapro 10 daily.  6. Fexofenadine 180 mg daily.  7. Diovan 10/12.5 daily.  8. Nexium 40 mg daily.  9. Crestor 10 mg daily.  10.Percocet 1 or 2 p.o. q.3 to 4h. p.r.n. pain.   She is to return to see me in 7 days for suture removal.           ______________________________  G. Dorene Grebe, M.D.     GSD/MEDQ  D:  04/04/2006  T:  04/05/2006  Job:  [id number]

## 2010-12-30 NOTE — Discharge Summary (Signed)
NAME:  [name], [name]                      ACCOUNT NO.:  [id number]   MEDICAL RECORD NO.:  [id number]                   PATIENT TYPE:  INP   LOCATION:  2034                                 FACILITY:  [facility]   PHYSICIAN:  Abelino Derrick, P.A.                DATE OF BIRTH:  [birth year]   DATE OF ADMISSION:  07/06/2002  DATE OF DISCHARGE:  07/07/2002                                 DISCHARGE SUMMARY   DISCHARGE DIAGNOSES:  1. Chest pain, rule out myocardial infarction.  2. Hypertension, controlled.  3. Degenerative joint disease.  4. History of asthma.   HOSPITAL COURSE:  The patient is a 65 year old female with no prior history  of coronary disease who was admitted on July 06, 2002 with chest pain.  Her symptoms were somewhat atypical.  The patient does have some risk  factors for coronary disease including hypertension.  She was admitted to  telemetry to rule out a myocardial infarction.  Electrocardiogram showed  sinus rhythm without acute changes.  The patient was admitted to telemetry.  She was placed on a beta blocker, aspirin and Nexium.  She had no further  chest pain.  She ruled out for a myocardial infarction.  We feel she can be  discharged July 07, 2002.  When I went to give the patient her discharge  instructions she also admitted to multiple other somatic complaints  including some left foot weakness, left leg pain and burning.  She has had  some chronic back problems.  She takes Vioxx.  Her heart rate was somewhat  slow on atenolol 50 mg per day, in the 40s.  Unfortunately, she did receive  her dose on the 24th.  I kept her for a few hours extra on the 24th to  monitor her heart rate and if stable she can go home this afternoon.  I have  cut her atenolol back to 25 mg q.d.  On review of her medications she had  been on Benicar, hydrochlorothiazide at home and this will be held for now.   DISCHARGE MEDICATIONS:  1. Vioxx 25 mg one q.d.  2. Aspirin 81 mg one  q.d.  3. Atenolol 25 mg q.d.  4. Nexium 40 mg q.d.  5. Lexapro 10 mg q.d. (She has been taking Lexapro on a PRN basis and I     encouraged her to take this once q.d. on a regular basis.)   DISCHARGE LABORATORY DATA:  White blood cell count 5.3, hemoglobin 12.1,  hematocrit 35.5, platelet count 204,000.  Sodium 140, potassium 4.0, BUN 20,  creatinine 1.0, glucose 107, INR 1.0, CK, MB and troponin levels are  negative X2.   DISPOSITION:  Patient is discharged in stable condition.  The patient will  have an exercise Cardiolite study Friday at 11:30 and see Dr. Tresa Endo on  July 22, 2002 at 8:45.  The patient has been instructed to stay out of  work until we have her [employer].                                               Abelino Derrick, P.ALenard Lance  D:  07/07/2002  T:  07/07/2002  Job:  [id number]

## 2010-12-30 NOTE — Op Note (Signed)
NAMEDANYELLA, MCGINTY            ACCOUNT NO.:  [id number]   MEDICAL RECORD NO.:  [id number]          PATIENT TYPE:  OIB   LOCATION:  5037                         FACILITY:  [facility]   PHYSICIAN:  Burnard Bunting, M.D.    DATE OF BIRTH:  [birth year]   DATE OF PROCEDURE:  03/13/2006  DATE OF DISCHARGE:                                 OPERATIVE REPORT   PREOPERATIVE DIAGNOSIS:  Left leg mass.   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   PROCEDURE:  Left leg deep mass.   SURGEON:  Burnard Bunting, M.D.   ANESTHESIA:  General endotracheal anesthesia.   ESTIMATED BLOOD LOSS:  10 cc.   TOURNIQUET TIME:  Two hours at 300 mmHg.   SPECIMENS:  A 2 x 2 cm cystic mass to pathology, frozen section consistent  with likely early clotted hemangioma.   INDICATIONS:  This is a 65 year old patient with left leg pain. She reports  deep calf pain and findings consistent with possible neurofibroma versus  mass in the left calf.  She presents now for operative management.   PROCEDURE IN DETAIL:  The patient was brought to the operating room where  general endotracheal anesthesia was induced.  Preoperative monitoring was  administered.  The patient was placed prone with the head in neutral  position and the extremities well padded.  The left leg was then prepped  with DuraPrep solution and draped in a sterile manner and the calf was  covered with Ioban.  The leg was elevated and exsanguinated with Esmarch  wrap.  Tourniquet was inflated.  In accordance with preoperative templating,  an incision was made centered 21 cm from the knee joint, 18 cm proximal to  the medial malleolus.  Skin and subcutaneous tissue were sharply divided.  Gastroc fascia was encountered and then divided along its medial border.  Care was taken to avoid injury to neurovascular structures.  Deep fascia was  then entered with the soleus.  Tibial nerve was identified and there were no  masses within the  tibial nerve itself.  Serpentine vessel was noted to be  coursing within the FHL, soleus and posterior muscle mass.  The serpentine  vessel was followed and under fluoroscopic guidance with precise  localization from the medial joint space, mass was visualized.  The cystic  mass was intramuscular and was excised en bloc.  It was attached to a  vascular stalk which was tied with a 2-0 silk suture, sent for frozen  section analysis.  The incision was thoroughly irrigated at this time.  Frozen analysis demonstrated low probability for malignancy.  Tourniquet was  released.  All bleeding points encountered were controlled using  electrocautery.  There was minimal bleeding.  Incision was closed using interrupted 2-0 Vicryl suture and simple 3-0 nylon  suture.  Bulky dressing was applied.  The patient tolerated the procedure  well without immediate complications.  It should be noted that the  assistance of Dr. Tresa Res was required for localization of the mass and  retracting of any  neurovascular structures.  ______________________________  G. Dorene Grebe, M.D.     GSD/MEDQ  D:  03/13/2006  T:  03/14/2006  Job:  [id number]

## 2010-12-30 NOTE — Op Note (Signed)
[name], [name]            ACCOUNT NO.:  [id number]   MEDICAL RECORD NO.:  [id number]          PATIENT TYPE:  OIB   LOCATION:  3014                         FACILITY:  [facility]   PHYSICIAN:  Burnard Bunting, M.D.    DATE OF BIRTH:  06-09-1946   DATE OF PROCEDURE:  04/06/2006  DATE OF DISCHARGE:  04/08/2006                                 OPERATIVE REPORT   PREOPERATIVE DIAGNOSIS:  Left calf superficial infection.   POSTOPERATIVE DIAGNOSIS:  Left calf superficial infection.   PROCEDURE:  Irrigation and debridement of left calf, with cultures.   SURGEON:  Burnard Bunting, M.D.   ASSISTANT:  None.   ANESTHESIA:  General endotracheal.   ESTIMATED BLOOD LOSS:  Minimal.   INDICATIONS:  [name] is a 65 year old female, who is 3 weeks out  from excision of a mass in the left calf.  She developed a superficial  infection, confirmed with ultrasound.  She presents now for operative  debridement.   PROCEDURE IN DETAIL:  The patient was brought to the operating room, where  general endotracheal anesthesia was induced.  The left leg was prepped with  Hibiclens and saline.  The prior incision was utilized.  In the subcutaneous  tissue, a pocket of serosanguinous fluid was encountered, and this was  thoroughly irrigated and then debrided with a curet.  The tissue planes were  maintained.  No deep infection was present.  Following thorough irrigation  and debridement, the incision was reapproximated using 3-0 nylon suture.  Cultures were obtained.  A bulky dressing was applied.  The patient  tolerated the procedure well without immediate complications.      Burnard Bunting, M.D.     GSD/MEDQ  D:  04/24/2006  T:  04/24/2006  Job:  [id number]

## 2011-02-28 ENCOUNTER — Other Ambulatory Visit: Payer: Self-pay | Admitting: Internal Medicine

## 2011-02-28 DIAGNOSIS — Z1231 Encounter for screening mammogram for malignant neoplasm of breast: Secondary | ICD-10-CM

## 2011-04-03 ENCOUNTER — Ambulatory Visit
Admission: RE | Admit: 2011-04-03 | Discharge: 2011-04-03 | Disposition: A | Discharge status: Home / Self Care | Payer: Medicare Other | Source: Ambulatory Visit | Attending: Internal Medicine | Admitting: Internal Medicine

## 2011-04-03 DIAGNOSIS — Z1231 Encounter for screening mammogram for malignant neoplasm of breast: Secondary | ICD-10-CM

## 2011-04-05 ENCOUNTER — Ambulatory Visit
Admission: RE | Admit: 2011-04-05 | Discharge: 2011-04-05 | Disposition: A | Discharge status: Home / Self Care | Payer: Medicare Other | Source: Ambulatory Visit | Attending: Internal Medicine | Admitting: Internal Medicine

## 2011-04-05 ENCOUNTER — Ambulatory Visit: Payer: Medicare Other

## 2011-04-05 ENCOUNTER — Other Ambulatory Visit: Payer: Self-pay | Admitting: Internal Medicine

## 2011-04-05 DIAGNOSIS — R05 Cough: Secondary | ICD-10-CM

## 2011-04-05 DIAGNOSIS — R059 Cough, unspecified: Secondary | ICD-10-CM

## 2011-04-05 IMAGING — CR DG CHEST 2V
2 series · 2 of 2 positions shown · non-contrast
Comparison: [DATE]

CLINICAL DATA: Cough and congestion.  Shortness of breath.

CHEST - 2 VIEW

[view not recorded (1 of 2)]
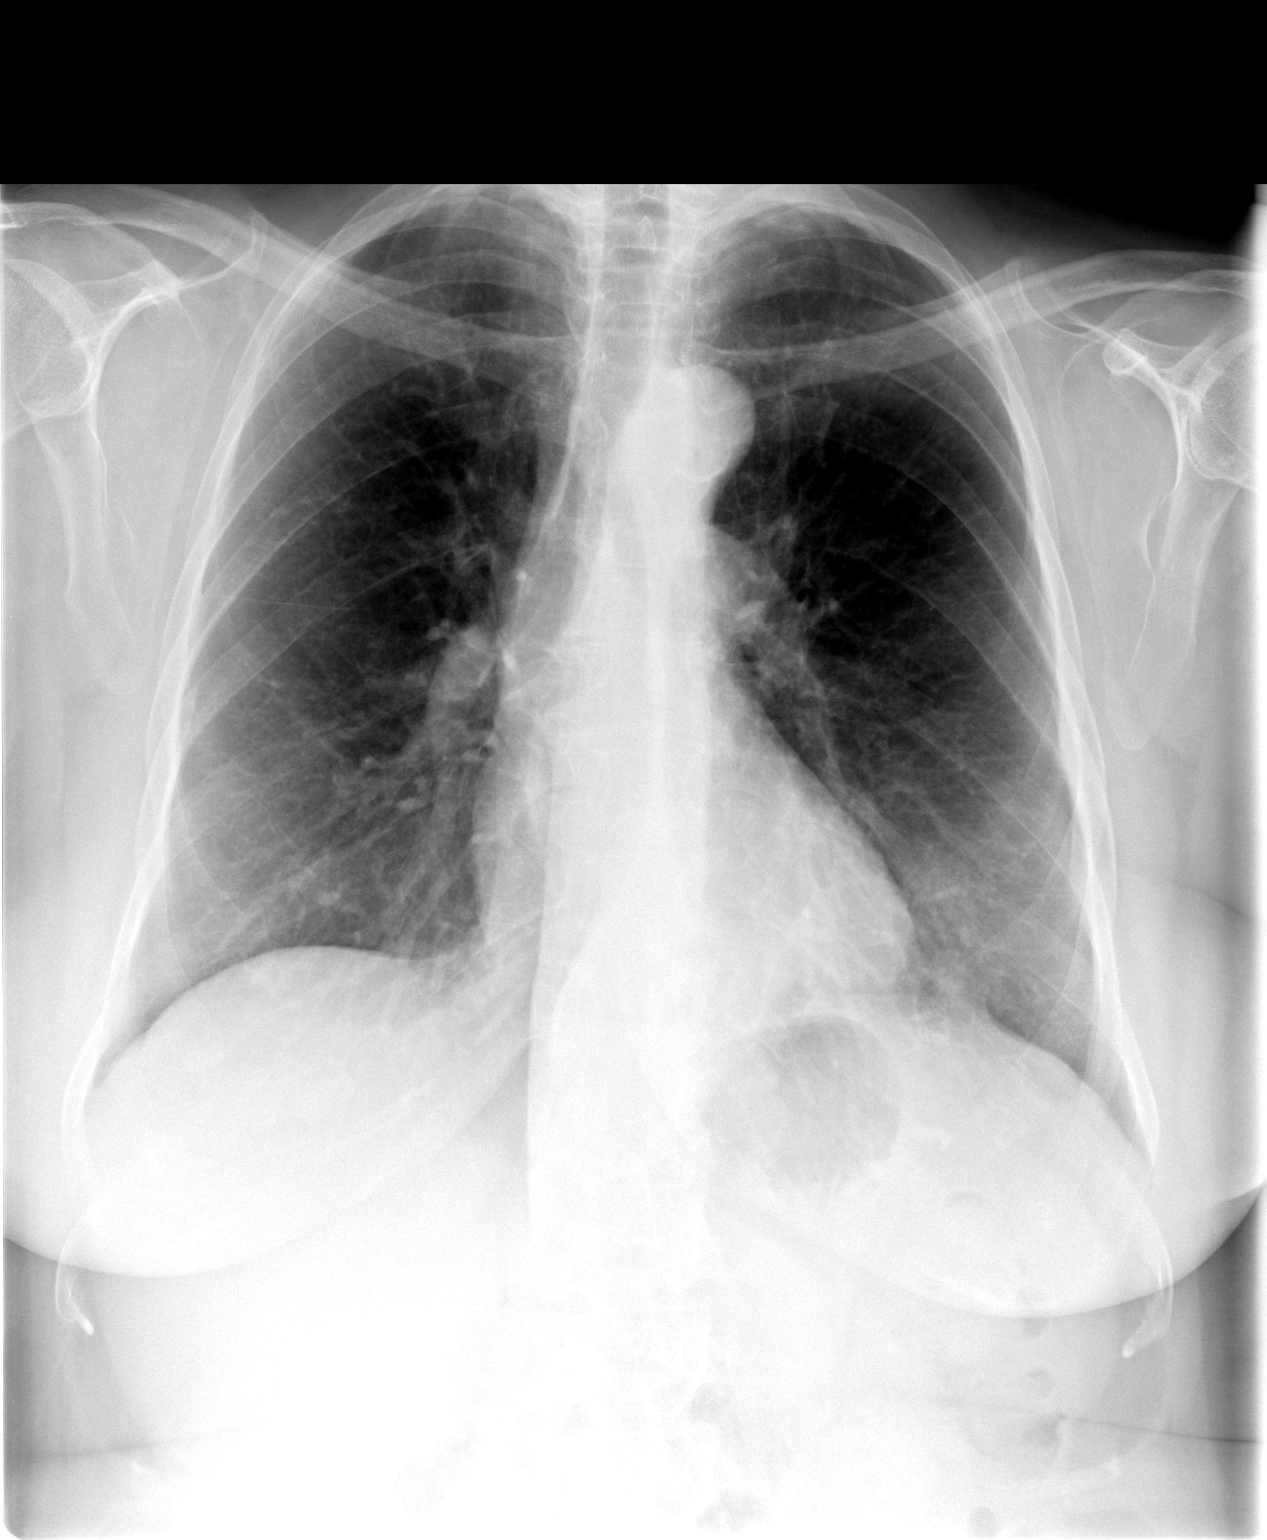

[view not recorded (2 of 2)]
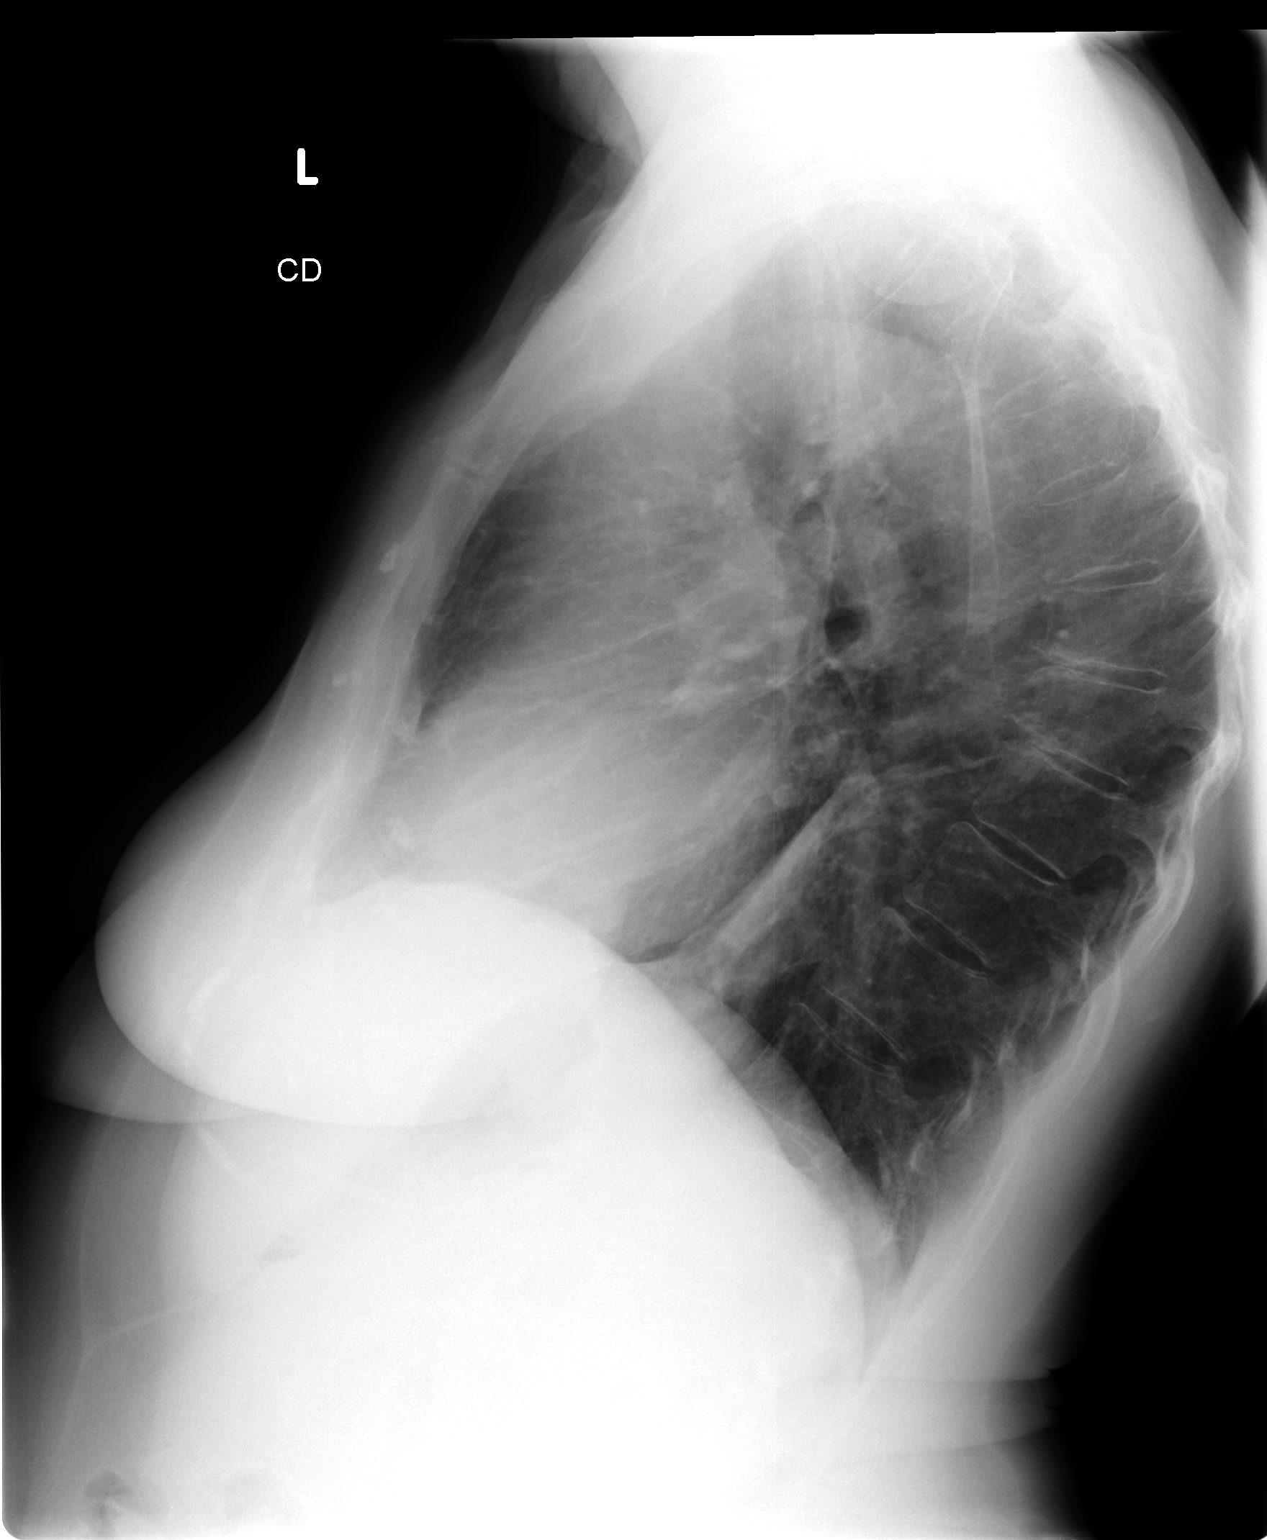

[2 of 2 positions shown; findings below may reference images not displayed]

FINDINGS: Heart size is normal.

No pleural effusion or pulmonary edema.

The interval partial atelectasis of the left lower lobe.

No definitive mass is identified.

The right lung appears clear.

The visualized osseous structures appear within normal limits.
IMPRESSION: 1.  Partial atelectasis of the left lower lobe.  Etiology
indeterminate.  Suggest follow up imaging to ensure resolution.  If
this does not resolve then a CT of the chest may be necessary to
assess for underlying endobronchial lesion.

## 2011-05-12 LAB — CARDIAC PANEL(CRET KIN+CKTOT+MB+TROPI)
CK, MB: 0.4
CK, MB: 0.5
CK, MB: 0.7
Relative Index: INVALID
Relative Index: INVALID
Relative Index: INVALID
Total CK: 22
Troponin I: 0.01
Troponin I: 0.02

## 2011-05-12 LAB — BLOOD GAS, ARTERIAL
Bicarbonate: 21.8
Bicarbonate: 25.4 — ABNORMAL HIGH
Drawn by: 251151
FIO2: 0.28
O2 Content: 2
O2 Saturation: 97.6
Patient temperature: 98.6
pCO2 arterial: 37.7
pH, Arterial: 7.434 — ABNORMAL HIGH
pO2, Arterial: 101 — ABNORMAL HIGH

## 2011-05-12 LAB — LIPID PANEL
HDL: 33 — ABNORMAL LOW
LDL Cholesterol: 82
Triglycerides: 146

## 2011-05-12 LAB — CBC
HCT: 32.7 — ABNORMAL LOW
HCT: 37
Hemoglobin: 11 — ABNORMAL LOW
Hemoglobin: 12.2
MCHC: 33.5
MCHC: 33.5
MCV: 86.7
MCV: 89.2
Platelets: 286
RBC: 3.67 — ABNORMAL LOW
RBC: 4.12
RBC: 4.21
WBC: 6
WBC: 7.7

## 2011-05-12 LAB — URINE CULTURE

## 2011-05-12 LAB — DIFFERENTIAL
Basophils Relative: 0
Eosinophils Absolute: 0.1
Monocytes Absolute: 0.5
Monocytes Relative: 7

## 2011-05-12 LAB — URINALYSIS, ROUTINE W REFLEX MICROSCOPIC
Bilirubin Urine: NEGATIVE
Ketones, ur: NEGATIVE
Nitrite: NEGATIVE
Protein, ur: NEGATIVE
Specific Gravity, Urine: 1.008
Urobilinogen, UA: 0.2

## 2011-05-12 LAB — BASIC METABOLIC PANEL
CO2: 30
Chloride: 103
GFR calc Af Amer: >60
Potassium: 4.1
Sodium: 139

## 2011-05-12 LAB — TROPONIN I: Troponin I: 0.02

## 2011-05-12 LAB — PROTIME-INR: INR: 1

## 2011-05-12 LAB — POCT CARDIAC MARKERS: Troponin i, poc: <0.05

## 2011-05-12 LAB — CK TOTAL AND CKMB (NOT AT ARMC)
Relative Index: INVALID
Total CK: 21

## 2011-05-12 LAB — HOMOCYSTEINE: Homocysteine: 9.5

## 2011-05-12 LAB — D-DIMER, QUANTITATIVE: D-Dimer, Quant: 0.42

## 2011-05-12 LAB — POCT I-STAT, CHEM 8
Calcium, Ion: 1.12
Chloride: 103
Glucose, Bld: 92
HCT: 37

## 2011-08-24 ENCOUNTER — Ambulatory Visit (HOSPITAL_COMMUNITY)
Admission: RE | Admit: 2011-08-24 | Discharge: 2011-08-24 | Disposition: A | Discharge status: Home / Self Care | Payer: Medicare Other | Source: Ambulatory Visit | Attending: Orthopedic Surgery | Admitting: Orthopedic Surgery

## 2011-08-24 DIAGNOSIS — R52 Pain, unspecified: Secondary | ICD-10-CM

## 2011-08-24 DIAGNOSIS — M79609 Pain in unspecified limb: Secondary | ICD-10-CM | POA: Insufficient documentation

## 2011-08-24 NOTE — Progress Notes (Signed)
*  PRELIMINARY RESULTS*  Left Lower Extremity Venous Duplex has been performed. Left:  No evidence of DVT, superficial thrombosis, or Baker's cyst.   Farrel Demark RDMS 08/24/2011, 3:43 PM

## 2011-11-01 ENCOUNTER — Ambulatory Visit
Admission: RE | Admit: 2011-11-01 | Discharge: 2011-11-01 | Disposition: A | Discharge status: Home / Self Care | Payer: Medicare Other | Source: Ambulatory Visit | Attending: Internal Medicine | Admitting: Internal Medicine

## 2011-11-01 ENCOUNTER — Other Ambulatory Visit: Payer: Self-pay | Admitting: Internal Medicine

## 2011-11-01 DIAGNOSIS — R05 Cough: Secondary | ICD-10-CM

## 2011-11-01 DIAGNOSIS — R059 Cough, unspecified: Secondary | ICD-10-CM

## 2011-11-01 IMAGING — CR DG CHEST 2V
2 series · 2 of 2 positions shown · non-contrast
Comparison: [DATE] and [DATE] radiographs.

CLINICAL DATA: Cough with left arm pain and swelling for 3 days.
History of COPD/asthma.

CHEST - 2 VIEW

[view not recorded (1 of 2)]
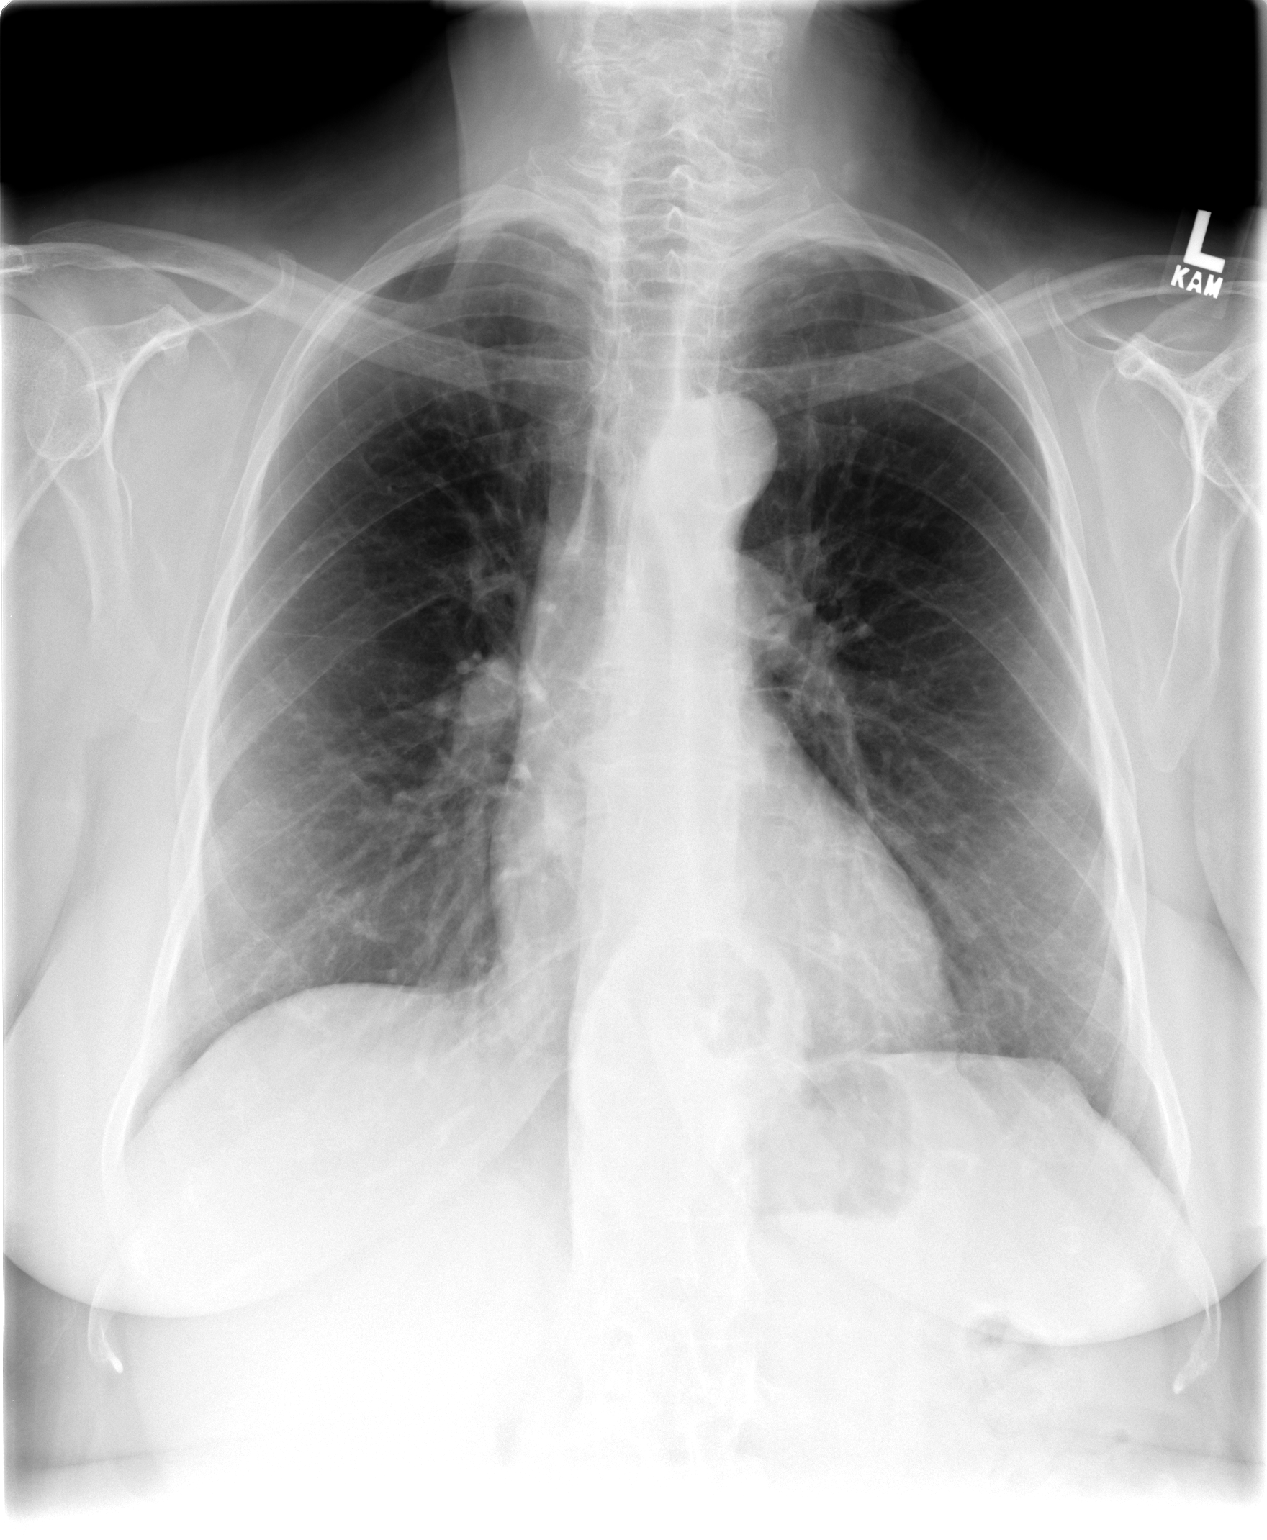

[view not recorded (2 of 2)]
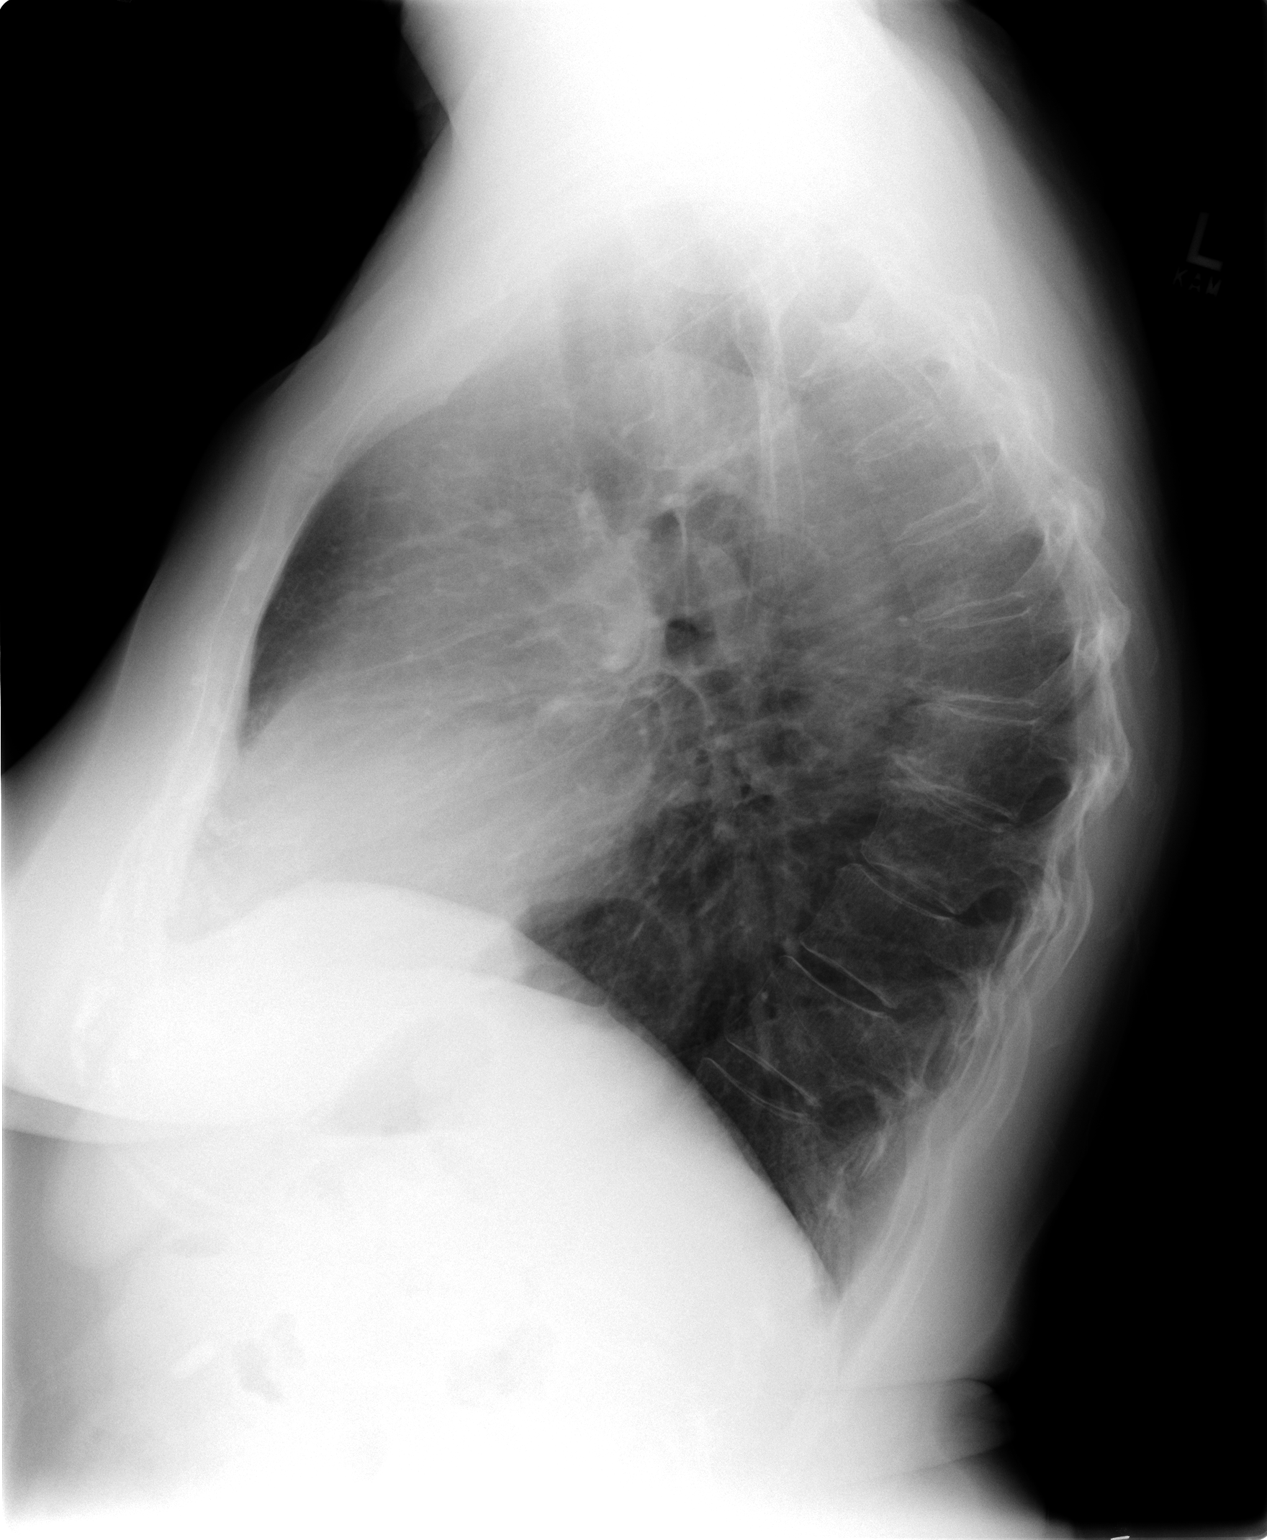

[2 of 2 positions shown; findings below may reference images not displayed]

FINDINGS: The heart size and mediastinal contours are stable.
There is a small to moderate hiatal hernia which appears slightly
larger.  The lungs are now clear.  The previously demonstrated left
lower lobe air space disease has resolved.  There is no pleural
effusion.  Mild degenerative changes of the thoracic spine are
stable.
IMPRESSION: 1.  Resolved left lower lobe air space disease.
2.  No acute cardiopulmonary process.
3.  Hiatal hernia appears slightly more prominent.

## 2012-02-29 ENCOUNTER — Other Ambulatory Visit: Payer: Self-pay | Admitting: Internal Medicine

## 2012-02-29 DIAGNOSIS — Z1231 Encounter for screening mammogram for malignant neoplasm of breast: Secondary | ICD-10-CM

## 2012-04-03 ENCOUNTER — Ambulatory Visit
Admission: RE | Admit: 2012-04-03 | Discharge: 2012-04-03 | Disposition: A | Discharge status: Home / Self Care | Payer: Medicare Other | Source: Ambulatory Visit | Attending: Internal Medicine | Admitting: Internal Medicine

## 2012-04-03 DIAGNOSIS — Z1231 Encounter for screening mammogram for malignant neoplasm of breast: Secondary | ICD-10-CM

## 2012-08-01 ENCOUNTER — Other Ambulatory Visit: Payer: Self-pay | Admitting: Internal Medicine

## 2012-08-01 DIAGNOSIS — M810 Age-related osteoporosis without current pathological fracture: Secondary | ICD-10-CM

## 2012-08-04 ENCOUNTER — Emergency Department (HOSPITAL_COMMUNITY)
Admission: EM | Admit: 2012-08-04 | Discharge: 2012-08-04 | Disposition: A | Discharge status: Home / Self Care | Payer: Medicare Other | Source: Home / Self Care | Attending: Emergency Medicine | Admitting: Emergency Medicine

## 2012-08-04 ENCOUNTER — Encounter (HOSPITAL_COMMUNITY): Payer: Self-pay | Admitting: Emergency Medicine

## 2012-08-04 DIAGNOSIS — K120 Recurrent oral aphthae: Secondary | ICD-10-CM

## 2012-08-04 MED ORDER — TRIAMCINOLONE ACETONIDE 0.1 % MT PSTE: PASTE | OROMUCOSAL | Status: DC

## 2012-08-04 NOTE — ED Notes (Signed)
Reports mouth pain for months now.  Patient has seen dentist and was given a mouthwash but no relief.  Patient states she has an abscess on both sides of mouth.

## 2012-08-04 NOTE — ED Provider Notes (Signed)
Chief Complaint  Patient presents with  . Oral Pain    History of Present Illness:   [name] is a 66 year old female who had a three-month history of pain in her mouth. This first began under her denture on the right lower alveolar ridge. She saw her primary care physician and was given an antibiotic shot and pills. The pain seemed to move up to the cheek area. She went back to her primary care physician and was given prednisone. She feels the pain isn't getting any better and now she has a bruise on her left cheek as well. She denies any sore throat or difficulty swallowing. She has upper and lower dentures. No fever, chills, or adenopathy.  Review of Systems:  Other than noted above, the patient denies any of the following symptoms: Systemic:  No fevers, chills, sweats, weight loss or gain, fatigue, or tiredness. Eye:  No redness, pain, discharge, itching, blurred vision, or diplopia. ENT:  No headache, nasal congestion, sneezing, itching, epistaxis, ear pain, congestion, decreased hearing, ringing in ears, vertigo, or tinnitus.  No oral lesions, sore throat, pain on swallowing, or hoarseness. Neck:  No mass, tenderness or adenopathy. Lungs:  No coughing, wheezing, or shortness of breath. Skin:  No rash or itching.  PMFSH:  Past medical history, family history, social history, meds, and allergies were reviewed.  Physical Exam:   Vital signs:  BP 137/84  Pulse 68  Temp 97.6 F (36.4 C) (Oral)  Resp 18  SpO2 99% General:  Alert and oriented.  In no distress.  Skin warm and dry. Eye:  PERRL, full EOMs, lids and conjunctiva normal.   ENT:  TMs and canals clear.  Nasal mucosa not congested and without drainage.  Mucous membranes moist, she has a small, shallow, aphthous ulcer on her right buccal mucosa. The alveolar ridges appear normal. The pharynx was clear. There no lesions on the tongue or the floor the mouth. She has what appears to be a bruise on the left buccal mucosa.  No cranial or  facial pain to palplation. Neck:  Supple, full ROM.  No adenopathy, tenderness or mass.  Thyroid normal. Lungs:  Breath sounds clear and equal bilaterally.  No wheezes, rales or rhonchi. Heart:  Rhythm regular, without extrasystoles.  No gallops or murmers. Skin:  Clear, warm and dry.  Course in Urgent Care Center:   He aphthous ulcer was cauterized with silver nitrate. Residual silver nitrate was swabbed off with a cotton tip swab. The patient tolerated this procedure well.   Assessment:  The encounter diagnosis was Aphthous ulcer.  Plan:   1.  The following meds were prescribed:   New Prescriptions   TRIAMCINOLONE (KENALOG) 0.1 % PASTE    Apply to lesion in mouth TID   2.  The patient was instructed in symptomatic care and handouts were given. 3.  The patient was told to return if becoming worse in any way, if no better in 3 or 4 days, and given some red flag symptoms that would indicate earlier return.  Follow up:  The patient was told to follow up with Dr. Annalee Genta for follow up on the aphthous ulcer to make sure it's healing.       Reuben Likes, MD 08/04/12 [phone]

## 2012-08-19 ENCOUNTER — Ambulatory Visit
Admission: RE | Admit: 2012-08-19 | Discharge: 2012-08-19 | Disposition: A | Discharge status: Home / Self Care | Payer: Medicare Other | Source: Ambulatory Visit | Attending: Internal Medicine | Admitting: Internal Medicine

## 2012-08-19 DIAGNOSIS — M810 Age-related osteoporosis without current pathological fracture: Secondary | ICD-10-CM

## 2013-03-11 ENCOUNTER — Other Ambulatory Visit: Payer: Self-pay

## 2013-03-11 DIAGNOSIS — Z1231 Encounter for screening mammogram for malignant neoplasm of breast: Secondary | ICD-10-CM

## 2013-04-04 ENCOUNTER — Ambulatory Visit
Admission: RE | Admit: 2013-04-04 | Discharge: 2013-04-04 | Disposition: A | Discharge status: Home / Self Care | Payer: Medicare Other | Source: Ambulatory Visit

## 2013-04-04 DIAGNOSIS — Z1231 Encounter for screening mammogram for malignant neoplasm of breast: Secondary | ICD-10-CM

## 2013-06-10 ENCOUNTER — Ambulatory Visit (INDEPENDENT_AMBULATORY_CARE_PROVIDER_SITE_OTHER): Payer: Medicare Other | Admitting: Podiatry

## 2013-06-10 ENCOUNTER — Encounter: Payer: Self-pay | Admitting: Podiatry

## 2013-06-10 VITALS — BP 124/73 | HR 56 | Ht 66.0 in | Wt 201.0 lb

## 2013-06-10 DIAGNOSIS — B351 Tinea unguium: Secondary | ICD-10-CM

## 2013-06-10 DIAGNOSIS — M79609 Pain in unspecified limb: Secondary | ICD-10-CM

## 2013-06-10 DIAGNOSIS — M79673 Pain in unspecified foot: Secondary | ICD-10-CM | POA: Insufficient documentation

## 2013-06-10 DIAGNOSIS — M2040 Other hammer toe(s) (acquired), unspecified foot: Secondary | ICD-10-CM

## 2013-06-10 NOTE — Progress Notes (Signed)
Subjective: 67 y.o. year old female patient presents complaining of painful nails. Patient requests toe nails, corns and calluses trimmed.   Review of Systems - General ROS: negative for - chills, fatigue, fever, hot flashes, malaise, night sweats, sleep disturbance, weight gain or weight loss Ophthalmic ROS: positive for - Glaucoma being treated with eye drops. ENT ROS: negative Allergy and Immunology ROS: positive for - Contrasting Dye, Bee sting, TB shot. Breast ROS: negative for breast lumps Respiratory ROS: Has Asthma and COPD. Uses inhaler.  Cardiovascular ROS: no chest pain or dyspnea on exertion Gastrointestinal ROS: no abdominal pain, change in bowel habits, or black or bloody stools Musculoskeletal ROS: negative Neurological ROS: no TIA or stroke symptoms Dermatological ROS: negative  Objective: Dermatologic: Thick yellow deformed nails right 1, 2, and 3rd digit right.  Has had dark spot on right leg checked and was found to be normal.  Vascular: Pedal pulses are all palpable. Orthopedic: Contracted 2nd toe right. Neurologic: All epicritic and tactile sensations grossly intact.  Assessment: Dystrophic mycotic nails x 1, 2, 3 right. Hammer toe 2nd right.  Treatment: Reviewed clinical findings. Patient wants Tenotomy done on right 2nd toe. Scheduled for right 2nd Flexor Tenotomy right.

## 2013-06-10 NOTE — Patient Instructions (Signed)
Seen for contracted 2nd digit right. As discussed Flexor Tenotomy might help the 2nd toe right.  Will schedule the office surgery in near future.

## 2013-06-14 HISTORY — PX: FOOT SURGERY: SHX648

## 2013-06-24 ENCOUNTER — Encounter: Payer: Self-pay | Admitting: Podiatry

## 2013-06-24 ENCOUNTER — Ambulatory Visit (INDEPENDENT_AMBULATORY_CARE_PROVIDER_SITE_OTHER): Payer: Medicare Other | Admitting: Podiatry

## 2013-06-24 VITALS — BP 148/79 | HR 56 | Ht 66.0 in | Wt 198.0 lb

## 2013-06-24 DIAGNOSIS — M2040 Other hammer toe(s) (acquired), unspecified foot: Secondary | ICD-10-CM

## 2013-06-24 DIAGNOSIS — M79609 Pain in unspecified limb: Secondary | ICD-10-CM

## 2013-06-24 NOTE — Progress Notes (Signed)
Subjective: Patient presents for scheduled surgery, flexor tenotomy for the 2nd digit right.  Objective: Severe contracted 2nd digit with pain in closed in shoes right. Multiple mycotic nails x 10.  Assessment: Painful hammer toe 2nd right.  Plan: Office surgery, flexor tenotomy 2nd digit right done under local anesthetics.  Local used: Total 3 ml of mixuture 1% Xylocaine with Epinephrine and 0.5% marcaine plain. Home care instruction given (Please stay off of feet for the next 24 hours. Do not get wet, do not disturb surgical dressing.) Return in one week for dressing change.

## 2013-06-24 NOTE — Patient Instructions (Signed)
Patient presents for scheduled surgery, flexor tenotomy for the 2nd digit right. Please stay off of feet for the next 24 hours. Do not get wet, do not disturb surgical dressing. Return in one week for dressing change.

## 2013-06-26 ENCOUNTER — Telehealth: Payer: Self-pay | Admitting: *Deleted

## 2013-06-26 NOTE — Telephone Encounter (Signed)
Patient called today 06/26/2013 stating toe was red, should there be any concern? Told  Patient to take Aleve or Ibuprofen for pain and call tomorrow if needed before Lunch.

## 2013-06-27 ENCOUNTER — Ambulatory Visit (INDEPENDENT_AMBULATORY_CARE_PROVIDER_SITE_OTHER): Payer: Medicare Other | Admitting: Podiatry

## 2013-06-27 ENCOUNTER — Encounter: Payer: Self-pay | Admitting: Podiatry

## 2013-06-27 VITALS — BP 131/79 | HR 55 | Temp 96.8°F

## 2013-06-27 DIAGNOSIS — Z9889 Other specified postprocedural states: Secondary | ICD-10-CM

## 2013-06-27 MED ORDER — CEPHALEXIN 500 MG PO CAPS: 500.0000 mg | ORAL_CAPSULE | Freq: Four times a day (QID) | ORAL | Status: DC

## 2013-06-27 NOTE — Progress Notes (Signed)
Patient called yesterday stating her toe is red. Patient came in today as told. Noted of tight bandage at the base of the 2nd toe that is causing distal edema.  Wound is dry and clean.  Dressing changed with Betadine solution. Rx. Keflex 500 mg tid prophylactic.

## 2013-06-27 NOTE — Patient Instructions (Signed)
Seen for swollen toe 2nd right post op. Swelling appears from tight bandage.  Will try antibiotics for the redness.  Return next week.

## 2013-07-01 ENCOUNTER — Encounter: Payer: Medicare Other | Admitting: Podiatry

## 2013-07-04 ENCOUNTER — Encounter: Payer: Self-pay | Admitting: Podiatry

## 2013-07-04 ENCOUNTER — Ambulatory Visit (INDEPENDENT_AMBULATORY_CARE_PROVIDER_SITE_OTHER): Payer: Medicare Other | Admitting: Podiatry

## 2013-07-04 DIAGNOSIS — Z9889 Other specified postprocedural states: Secondary | ICD-10-CM

## 2013-07-04 NOTE — Patient Instructions (Signed)
Wound healing good. Sutures removed. Ok to get [employer]. Return as needed.

## 2013-07-04 NOTE — Progress Notes (Signed)
Patient denies any problem with the toe. Had some pain yesterday but doing fine today.   Objective: Clean dry wound. No edema or erythema associated with incision site.   Assessment: Satisfactory wound healing following Tenotomy 2nd right.  Plan: Suture removed. Betadine dressing applied. Ok to resume routine activity. Return as needed.

## 2013-09-02 ENCOUNTER — Encounter: Payer: Self-pay | Admitting: Podiatry

## 2013-09-02 ENCOUNTER — Ambulatory Visit (INDEPENDENT_AMBULATORY_CARE_PROVIDER_SITE_OTHER): Payer: Medicare HMO | Admitting: Podiatry

## 2013-09-02 VITALS — BP 137/74 | HR 74 | Ht 66.0 in | Wt 216.0 lb

## 2013-09-02 DIAGNOSIS — M2040 Other hammer toe(s) (acquired), unspecified foot: Secondary | ICD-10-CM

## 2013-09-02 DIAGNOSIS — M79673 Pain in unspecified foot: Secondary | ICD-10-CM

## 2013-09-02 DIAGNOSIS — B351 Tinea unguium: Secondary | ICD-10-CM

## 2013-09-02 DIAGNOSIS — M79609 Pain in unspecified limb: Secondary | ICD-10-CM

## 2013-09-02 NOTE — Patient Instructions (Signed)
Seen for hypertrophic nails. All nails debrided. Return in 3 months or as needed.  

## 2013-09-02 NOTE — Progress Notes (Signed)
Subjective:  68 y.o. year old female patient presents complaining of painful toe 2nd right.   Objective: Dermatologic: Thick yellow deformed nails right 1-5 right, hypertrophic nails 1-5 left. Mild erythema 2nd digit with contracture and thick nails.  Vascular: Pedal pulses are all palpable.  Orthopedic: Contracted 2nd toe right.  Neurologic: All epicritic and tactile sensations grossly intact.   Assessment:  Dystrophic mycotic nails x 1, 2, 3 right.  Inflamed 2nd toe right secondary to shoe friction to thick nail and contracture.  Plan: Debrided all nails.  Patient is to stay in open toed shoes till pain subside.

## 2013-12-02 ENCOUNTER — Ambulatory Visit: Payer: Medicare Other | Admitting: Podiatry

## 2013-12-03 ENCOUNTER — Ambulatory Visit: Payer: Medicare HMO

## 2013-12-20 ENCOUNTER — Encounter (HOSPITAL_COMMUNITY): Payer: Self-pay | Admitting: Emergency Medicine

## 2013-12-20 ENCOUNTER — Other Ambulatory Visit (HOSPITAL_COMMUNITY)
Admission: RE | Admit: 2013-12-20 | Discharge: 2013-12-20 | Disposition: A | Discharge status: Home / Self Care | Payer: Medicare HMO | Source: Ambulatory Visit | Attending: Emergency Medicine | Admitting: Emergency Medicine

## 2013-12-20 ENCOUNTER — Emergency Department (HOSPITAL_COMMUNITY)
Admission: EM | Admit: 2013-12-20 | Discharge: 2013-12-20 | Disposition: A | Discharge status: Home / Self Care | Payer: Medicare HMO | Source: Home / Self Care | Attending: Emergency Medicine | Admitting: Emergency Medicine

## 2013-12-20 DIAGNOSIS — B373 Candidiasis of vulva and vagina: Secondary | ICD-10-CM

## 2013-12-20 DIAGNOSIS — N390 Urinary tract infection, site not specified: Secondary | ICD-10-CM

## 2013-12-20 DIAGNOSIS — B3731 Acute candidiasis of vulva and vagina: Secondary | ICD-10-CM

## 2013-12-20 DIAGNOSIS — N760 Acute vaginitis: Secondary | ICD-10-CM | POA: Insufficient documentation

## 2013-12-20 LAB — POCT URINALYSIS DIP (DEVICE)
Glucose, UA: NEGATIVE mg/dL
Nitrite: POSITIVE — AB
PH: 5.5 (ref 5.0–8.0)
Protein, ur: >=300 mg/dL — AB
Specific Gravity, Urine: >=1.03 (ref 1.005–1.030)
Urobilinogen, UA: 1 mg/dL (ref 0.0–1.0)

## 2013-12-20 MED ORDER — FLUCONAZOLE 150 MG PO TABS: 150.0000 mg | ORAL_TABLET | ORAL | Status: AC

## 2013-12-20 MED ORDER — CEPHALEXIN 500 MG PO CAPS: 500.0000 mg | ORAL_CAPSULE | Freq: Four times a day (QID) | ORAL | Status: DC

## 2013-12-20 NOTE — ED Provider Notes (Signed)
CSN: [id number]     Arrival date & time 12/20/13  1215 History   First MD Initiated Contact with Patient 12/20/13 1357     Chief Complaint  Patient presents with  . Dysuria   (Consider location/radiation/quality/duration/timing/severity/associated sxs/prior Treatment) HPI Comments: Pt with uti sx for 2 weeks; took otc azo and sx improved for awhile. More severe sx returned yesterday.  Also c/o vulvar irritation for 3 weeks. Pt is not sexually active  Patient is a 68 y.o. female presenting with dysuria. The history is provided by the patient.  Dysuria Pain quality:  Burning Pain severity:  Moderate Onset quality:  Gradual Duration:  2 weeks Timing:  Intermittent Progression:  Worsening Chronicity:  New Recent urinary tract infections: no   Relieved by:  Nothing Worsened by:  Nothing tried Ineffective treatments:  Phenazopyridine Urinary symptoms: foul-smelling urine and frequent urination   Associated symptoms: no abdominal pain, no fever, no flank pain, no nausea, no vaginal discharge and no vomiting     History reviewed. No pertinent past medical history. History reviewed. No pertinent past surgical history. History reviewed. No pertinent family history. History  Substance Use Topics  . Smoking status: Never Smoker   . Smokeless tobacco: Never Used  . Alcohol Use: Not on file   OB History   Grav Para Term Preterm Abortions TAB SAB Ect Mult Living                 Review of Systems  Constitutional: Positive for chills. Negative for fever.  Gastrointestinal: Negative for nausea, vomiting and abdominal pain.  Genitourinary: Positive for dysuria. Negative for flank pain and vaginal discharge.    Allergies  Iohexol  Home Medications   Prior to Admission medications   Medication Sig Start Date End Date Taking? Authorizing Provider  albuterol (PROVENTIL) 2 MG tablet Take 2 mg by mouth at bedtime.    Historical Provider, MD  aspirin 81 MG tablet Take 81 mg by mouth  daily.    Historical Provider, MD  budesonide-formoterol (SYMBICORT) 160-4.5 MCG/ACT inhaler Inhale 2 puffs into the lungs every morning.    Historical Provider, MD  calcium carbonate (OS-CAL) 600 MG TABS tablet Take 600 mg by mouth 2 (two) times daily with a meal.    Historical Provider, MD  cephALEXin (KEFLEX) 500 MG capsule Take 1 capsule (500 mg total) by mouth 4 (four) times daily. 12/20/13   Carvel Getting, NP  Cholecalciferol (VITAMIN D-3) 1000 UNITS CAPS Take by mouth daily.    Historical Provider, MD  fluconazole (DIFLUCAN) 150 MG tablet Take 1 tablet (150 mg total) by mouth every 3 (three) days. 12/20/13 12/27/13  Carvel Getting, NP  irbesartan (AVAPRO) 150 MG tablet Take 150 mg by mouth daily.    Historical Provider, MD  latanoprost (XALATAN) 0.005 % ophthalmic solution Place 1 drop into both eyes at bedtime.    Historical Provider, MD  Multiple Vitamins-Minerals (CENTRUM SILVER ADULT 50+ PO) Take by mouth daily.    Historical Provider, MD  Potassium Gluconate 595 MG TBCR Take by mouth daily.    Historical Provider, MD  ranitidine (ZANTAC) 150 MG tablet Take 150 mg by mouth as needed for heartburn.    Historical Provider, MD  rosuvastatin (CRESTOR) 20 MG tablet Take 20 mg by mouth daily.    Historical Provider, MD  terbinafine (LAMISIL) 250 MG tablet Take 250 mg by mouth daily.    Historical Provider, MD  triamcinolone (KENALOG) 0.1 % paste Apply to lesion in mouth TID  08/04/12   Harden Mo, MD  vitamin B-12 (CYANOCOBALAMIN) 250 MCG tablet Take 250 mcg by mouth daily.    Historical Provider, MD   BP 100/72  Pulse 76  Temp(Src) 97.1 F (36.2 C) (Oral)  Resp 16  SpO2 96% Physical Exam  Constitutional: She appears well-developed and well-nourished. No distress.  Pulmonary/Chest: Effort normal.  Abdominal: There is no CVA tenderness.  Genitourinary: There is rash and tenderness on the right labia. There is no lesion on the right labia. There is rash and tenderness on the left labia.  There is no lesion on the left labia. Cervix exhibits no motion tenderness. There is tenderness around the vagina. No vaginal discharge found.  Skin of vulva red, shiny, irritated, tender; c/w candida.   Lymphadenopathy:       Right: No inguinal adenopathy present.       Left: No inguinal adenopathy present.    ED Course  Procedures (including critical care time) Labs Review Labs Reviewed  POCT URINALYSIS DIP (DEVICE) - Abnormal; Notable for the following:    Bilirubin Urine SMALL (*)    Ketones, ur TRACE (*)    Hgb urine dipstick LARGE (*)    Protein, ur >=300 (*)    Nitrite POSITIVE (*)    Leukocytes, UA LARGE (*)    All other components within normal limits  CERVICOVAGINAL ANCILLARY ONLY    Imaging Review No results found.   MDM   1. UTI (lower urinary tract infection)   2. Vulvovaginal candidiasis   rx keflex 500mg  QID #28, rx diflucan 150mg  [id number] #3. Pt to f/u with pcp this week.      Carvel Getting, NP 12/20/13 1404

## 2013-12-20 NOTE — Discharge Instructions (Signed)
Please follow up with your doctor in a week to make sure your urinary tract infection is completely healed.    Candidal Vulvovaginitis Candidal vulvovaginitis is an infection of the vagina and vulva. The vulva is the skin around the opening of the vagina. This may cause itching and discomfort in and around the vagina.  HOME CARE  Only take medicine as told by your doctor.  Do not have sex (intercourse) until the infection is healed or as told by your doctor.  Practice safe sex.  Tell your sex partner about your infection.  Do not douche or use tampons.  Wear cotton underwear. Do not wear tight pants or panty hose.  Eat yogurt. This may help treat and prevent yeast infections. GET HELP RIGHT AWAY IF:   You have a fever.  Your problems get worse during treatment or do not get better in 3 days.  You have discomfort, irritation, or itching in your vagina or vulva area.  You have pain after sex.  You start to get belly (abdominal) pain. MAKE SURE YOU:  Understand these instructions.  Will watch your condition.  Will get help right away if you are not doing well or get worse. Document Released: 10/27/2008 Document Revised: 10/23/2011 Document Reviewed: 10/27/2008 [facility] Patient Information 2014 [place], [place].  Urinary Tract Infection A urinary tract infection (UTI) can occur any place along the urinary tract. The tract includes the kidneys, ureters, bladder, and urethra. A type of germ called bacteria often causes a UTI. UTIs are often helped with antibiotic medicine.  HOME CARE   If given, take antibiotics as told by your doctor. Finish them even if you start to feel better.  Drink enough fluids to keep your pee (urine) clear or pale yellow.  Avoid tea, drinks with caffeine, and bubbly (carbonated) drinks.  Pee often. Avoid holding your pee in for a long time.  Pee before and after having sex (intercourse).  Wipe from front to back after you poop (bowel movement)  if you are a woman. Use each tissue only once. GET HELP RIGHT AWAY IF:   You have back pain.  You have lower belly (abdominal) pain.  You have chills.  You feel sick to your stomach (nauseous).  You throw up (vomit).  Your burning or discomfort with peeing does not go away.  You have a fever.  Your symptoms are not better in 3 days. MAKE SURE YOU:   Understand these instructions.  Will watch your condition.  Will get help right away if you are not doing well or get worse. Document Released: 01/17/2008 Document Revised: 04/24/2012 Document Reviewed: 02/29/2012 [facility] Patient Information 2014 [place], [place].

## 2013-12-20 NOTE — ED Notes (Signed)
C/o dysuria States she has burning when urinating, urine is dark, chills States sx has been for two weeks  States pain was all night

## 2013-12-20 NOTE — ED Provider Notes (Signed)
Medical screening examination/treatment/procedure(s) were performed by non-physician practitioner and as supervising physician I was immediately available for consultation/collaboration.  Dene Nazir, M.D.  Tashawnda Bleiler C Gaynor Genco, MD 12/20/13 1913 

## 2013-12-22 LAB — CERVICOVAGINAL ANCILLARY ONLY
WET PREP (BD AFFIRM): NEGATIVE
WET PREP (BD AFFIRM): NEGATIVE
Wet Prep (BD Affirm): NEGATIVE

## 2013-12-23 ENCOUNTER — Ambulatory Visit (INDEPENDENT_AMBULATORY_CARE_PROVIDER_SITE_OTHER): Payer: Medicare HMO

## 2013-12-23 VITALS — BP 136/75 | HR 56 | Resp 15 | Ht 66.25 in | Wt 208.0 lb

## 2013-12-23 DIAGNOSIS — M79673 Pain in unspecified foot: Secondary | ICD-10-CM

## 2013-12-23 DIAGNOSIS — Z9889 Other specified postprocedural states: Secondary | ICD-10-CM

## 2013-12-23 DIAGNOSIS — M79609 Pain in unspecified limb: Secondary | ICD-10-CM

## 2013-12-23 DIAGNOSIS — M2040 Other hammer toe(s) (acquired), unspecified foot: Secondary | ICD-10-CM

## 2013-12-23 DIAGNOSIS — B351 Tinea unguium: Secondary | ICD-10-CM

## 2013-12-23 MED ORDER — TAVABOROLE 5 % EX SOLN: CUTANEOUS | Status: DC

## 2013-12-23 NOTE — Patient Instructions (Signed)
Onychomycosis/Fungal Toenails  WHAT IS IT? An infection that lies within the keratin of your nail plate that is caused by a fungus.  WHY ME? Fungal infections affect all ages, sexes, races, and creeds.  There may be many factors that predispose you to a fungal infection such as age, coexisting medical conditions such as diabetes, or an autoimmune disease; stress, medications, fatigue, genetics, etc.  Bottom line: fungus thrives in a warm, moist environment and your shoes offer such a location.  IS IT CONTAGIOUS? Theoretically, yes.  You do not want to share shoes, nail clippers or files with someone who has fungal toenails.  Walking around barefoot in the same room or sleeping in the same bed is unlikely to transfer the organism.  It is important to realize, however, that fungus can spread easily from one nail to the next on the same foot.  HOW DO WE TREAT THIS?  There are several ways to treat this condition.  Treatment may depend on many factors such as age, medications, pregnancy, liver and kidney conditions, etc.  It is best to ask your doctor which options are available to you.  1. No treatment.   Unlike many other medical concerns, you can live with this condition.  However for many people this can be a painful condition and may lead to ingrown toenails or a bacterial infection.  It is recommended that you keep the nails cut short to help reduce the amount of fungal nail. 2. Topical treatment.  These range from herbal remedies to prescription strength nail lacquers.  About 40-50% effective, topicals require twice daily application for approximately 9 to 12 months or until an entirely new nail has grown out.  The most effective topicals are medical grade medications available through physicians offices. 3. Oral antifungal medications.  With an 80-90% cure rate, the most common oral medication requires 3 to 4 months of therapy and stays in your system for a year as the new nail grows out.  Oral  antifungal medications do require blood work to make sure it is a safe drug for you.  A liver function panel will be performed prior to starting the medication and after the first month of treatment.  It is important to have the blood work performed to avoid any harmful side effects.  In general, this medication safe but blood work is required. 4. Laser Therapy.  This treatment is performed by applying a specialized laser to the affected nail plate.  This therapy is noninvasive, fast, and non-painful.  It is not covered by insurance and is therefore, out of pocket.  The results have been very good with a 80-95% cure rate.  [facility] is the only practice in the area to offer this therapy. 5. Permanent Nail Avulsion.  Removing the entire nail so that a new nail will not grow back.  Apply the topical prescribed medication once daily to each affected  toenail for 12 months

## 2013-12-23 NOTE — Progress Notes (Signed)
   Subjective:    Patient ID: [name], female    DOB: [birth year], 68 y.o.   MRN: [id number]  HPI Comments: N hammer toe L right 2nd toe D after surgery in 06/2013 O C red, swollen and contracted dorsally A shoe wear and walking T [facility] gave silicone brace     Review of Systems  Neurological:       Pt states had tingling in right hand after resting right elbow on hard chair arm, 12/23/2013.    All other systems reviewed and are negative.      Objective:   Physical Exam Masker status is intact bilateral pedal pulses palpable DP +2/4 bilateral PT one over 4 bilateral capillary refill timed 3-4 seconds skin temperature warm to cool turgor diminished nails show some slight thickening yellowing discoloration right more so the left and brittleness discoloration and friability patient request topical antifungal treatment and prescription for keratin this furnished at this time. 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 Patient also has residual HAV deformity bilateral which is laterally deviated and pushing on the second digit noted clinically and radiographically as well. Previous hammertoe surgery doing tenotomy capsulotomy provided relief however at some point she may need a hammertoe repair with K wire screw fixation. For the second toe right foot.  May also require depth of hammertoe repair for lesser digits if symptoms persist or worsen.       Assessment & Plan:  Assessment #1 onychomycosis painful mycotic nails recommended topical antifungal prescription for keratin [facility].  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 for hammertoe repair possibly the bunion repair based on symptomology at that time. Next  Harriet Masson DPM

## 2014-02-06 ENCOUNTER — Other Ambulatory Visit: Payer: Self-pay | Admitting: Urology

## 2014-02-06 DIAGNOSIS — R102 Pelvic and perineal pain: Secondary | ICD-10-CM

## 2014-02-10 ENCOUNTER — Telehealth: Payer: Self-pay | Admitting: Nurse Practitioner

## 2014-02-10 NOTE — Telephone Encounter (Signed)
Message left to return call to Tracy at [phone].    

## 2014-02-10 NOTE — Telephone Encounter (Addendum)
Patient is having burning, itching, and redness. She said it hurts. Said she has seen her primary for it and he gave her rx for it but it did not do anything this was in mid april. She said she has also went to the urgent care was told she had a sever infection and gave her rx for it but it never went away this was the end of may. Went back to her Her primary  Beginning of the month and they sent her a referral to the urologist she saw them on 01/23/14 said they gave her primern cream but she said it burned and hurt really bad. They scheduled her to a ultrasound on 02/23/14. She said she has not gotten any relief from any of it at the three places she has went before said she is unable to wear any panties at all and wants to come here so we can fix the problem.  Spoke with Coralyn Mark from her primarys office she is going to [employer] about the referral  She said that we need to wait to here back from them before we can scheduled the patient

## 2014-02-10 NOTE — Telephone Encounter (Signed)
Spoke with patient. She states that vulvar area is red, raw, and painful. States Premarin cream given prior is making area worse and burning skin. She is also hesitant to use premarin cream as she read that there is a risk for cancer and she does not want to risk it. Patient does not want to see her female pcp for this problem as she feels more comfortable with a female provider. Denies dysuria or fevers. According to note below, patient needs referral from pcp for specialty and advised patient that I could schedule her for office visit, but that without referral it was likely to not be covered. Patient does not want that. She states she is going to call [employer] to clarify. Advised if symptoms worsen before seen at our office, should call pcp or urgent care and be seen. She is agreeable to this.   Call to pcp office again by Ishmael Holter at this time to request referral be placed.

## 2014-02-10 NOTE — Telephone Encounter (Signed)
Called Coralyn Mark at Dr Adela Ports office she states that the pt was sent to urologist for the dysuria. And they have not received notes back from their office. She isnt sure why pt would need to be seen here for the problem since it is already being taken care of. States she would send [place] the referral and if we thought we could help her with the problem we were more than welcome to see the pt. They had referred her to the urologist for the issue and doesn't not understand why the patient is wanting to come here. Said she would fax the referral as soon as she had a chance to contact [facility] which she has not had a chance to do yet.

## 2014-02-11 NOTE — Telephone Encounter (Signed)
Tiffany, has referral been received?

## 2014-02-11 NOTE — Telephone Encounter (Signed)
Just spoke with [facility] at Dr Adela Ports office she said she is sending the fax over in just a few minutes.

## 2014-02-11 NOTE — Telephone Encounter (Signed)
No ive been looking for it.

## 2014-02-11 NOTE — Telephone Encounter (Signed)
Received referral from Dr. Adela Ports office.  Message left to return call to [place] at [phone].

## 2014-02-12 ENCOUNTER — Ambulatory Visit (INDEPENDENT_AMBULATORY_CARE_PROVIDER_SITE_OTHER): Payer: Medicare HMO | Admitting: Nurse Practitioner

## 2014-02-12 ENCOUNTER — Encounter: Payer: Self-pay | Admitting: Nurse Practitioner

## 2014-02-12 VITALS — BP 116/66 | HR 60 | Resp 18 | Ht 66.25 in | Wt 204.0 lb

## 2014-02-12 DIAGNOSIS — R300 Dysuria: Secondary | ICD-10-CM

## 2014-02-12 DIAGNOSIS — R309 Painful micturition, unspecified: Secondary | ICD-10-CM

## 2014-02-12 DIAGNOSIS — B3749 Other urogenital candidiasis: Secondary | ICD-10-CM

## 2014-02-12 LAB — POCT URINALYSIS DIPSTICK
BILIRUBIN UA: NEGATIVE
Blood, UA: NEGATIVE
Glucose, UA: NEGATIVE
Ketones, UA: NEGATIVE
NITRITE UA: NEGATIVE
PH UA: 5
PROTEIN UA: NEGATIVE
Urobilinogen, UA: NEGATIVE

## 2014-02-12 MED ORDER — NITROFURANTOIN MONOHYD MACRO 100 MG PO CAPS: 100.0000 mg | ORAL_CAPSULE | Freq: Two times a day (BID) | ORAL | Status: DC

## 2014-02-12 MED ORDER — NYSTATIN-TRIAMCINOLONE 100000-0.1 UNIT/GM-% EX OINT: 1.0000 "application " | TOPICAL_OINTMENT | Freq: Two times a day (BID) | CUTANEOUS | Status: DC

## 2014-02-12 MED ORDER — FLUCONAZOLE 150 MG PO TABS: 150.0000 mg | ORAL_TABLET | Freq: Once | ORAL | Status: DC

## 2014-02-12 NOTE — Telephone Encounter (Signed)
Spoke with patient and appointment scheduled for today at 2:45. She is agreeable to this. Routing to provider for final review. Patient agreeable to disposition. Will close encounter

## 2014-02-12 NOTE — Patient Instructions (Signed)

## 2014-02-12 NOTE — Progress Notes (Signed)
Subjective:     Patient ID: [name], female   DOB: 1946/02/29, 68 y.o.   MRN: [id number]  HPI  This 68 yo WW Fe comes is with symptoms of UTI that has been ongoing for several months.  She feels a spasm at times with the urethra and dysuria most of the time.  She had a bad infection in May and was treated with antibiotics.  She was then referred to Urologist who did an [place] but she is to follow with them later this month.  She is concerned about cancer of the bladder or kidneys.  At some point she was given Premarin vaginal cream to use at the urethra and had a bad local reaction.  She had swelling of the vulva with increase in tenderness and rash.  She is now sore in the genital area.  Denies fever or chills.  Denies vaginal bleeding.   Review of Systems  Constitutional: Negative for fever, chills and fatigue.  Cardiovascular: Negative.   Gastrointestinal: Negative.   Genitourinary: Positive for dysuria, urgency, frequency and vaginal pain. Negative for vaginal bleeding and vaginal discharge.  Musculoskeletal: Negative.        Objective:   Physical Exam  Constitutional: She appears well-developed and well-nourished.  Abdominal: Soft. She exhibits no distension. There is no tenderness. There is no rebound and no guarding.  Genitourinary:  No vaginal discharge. Very red and irritated externally consistent with yeast. urethra is prolapsed without lesion or bleeding.  Tender bimanually over the bladder        Assessment:     R/O UTI Atrophic vaginitis Yeast vulva-vaginitis    Plan:     Start on Macrobid 100 mg BID # 20 Diflucan 150 mg X 2 Triamcinolone and nystatin externally prn Avoid use of premarin vaginal cream since she had a local reaction Advised to continue with follow up to Urologist

## 2014-02-13 LAB — URINE CULTURE: Colony Count: 30000

## 2014-02-16 ENCOUNTER — Other Ambulatory Visit: Payer: Self-pay

## 2014-02-16 NOTE — Telephone Encounter (Signed)
Is Mycostatin alone covered by her insurance? If so we can do that one.

## 2014-02-16 NOTE — Telephone Encounter (Signed)
[facility] stated that the pt ins does not cover the rx. Pt would like something else.

## 2014-02-16 NOTE — Telephone Encounter (Signed)
Called [facility] and the pharm stated they separated the Nystatin and the triamcinolone into 2 creams and instructed the pt on how to use. Pt has already picked the rxs up. No need for a change in rx  Encounter closed

## 2014-02-17 NOTE — Progress Notes (Signed)
Encounter reviewed by Dr. Brook Silva.  

## 2014-02-18 ENCOUNTER — Telehealth: Payer: Self-pay | Admitting: *Deleted

## 2014-02-18 NOTE — Telephone Encounter (Signed)
Message copied by Graylon Good on Wed Feb 18, 2014  9:39 AM ------      Message from: Kem Boroughs R      Created: Sun Feb 15, 2014  5:40 PM       Let patient know that urine culture show multiple bacteria but none predominant.  She may continue with antibiotics as this has been an ongoing problem until she is seen at the urology appointment.  How is her other yeast symptoms?. ------

## 2014-02-18 NOTE — Telephone Encounter (Signed)
Patient is calling stephanie back please call back in am

## 2014-02-18 NOTE — Telephone Encounter (Signed)
I have attempted to contact this patient by phone with the following results: left message to return my call on answering machine (home per [facility]).[phone]

## 2014-02-20 NOTE — Telephone Encounter (Signed)
Results given in result note on 02/19/14.  Awaiting response from provider.

## 2014-02-23 ENCOUNTER — Other Ambulatory Visit: Payer: Self-pay | Admitting: Urology

## 2014-02-23 ENCOUNTER — Ambulatory Visit (HOSPITAL_COMMUNITY)
Admission: RE | Admit: 2014-02-23 | Discharge: 2014-02-23 | Disposition: A | Discharge status: Home / Self Care | Payer: Medicare HMO | Source: Ambulatory Visit | Attending: Urology | Admitting: Urology

## 2014-02-23 DIAGNOSIS — R102 Pelvic and perineal pain: Secondary | ICD-10-CM

## 2014-02-23 DIAGNOSIS — N949 Unspecified condition associated with female genital organs and menstrual cycle: Secondary | ICD-10-CM | POA: Insufficient documentation

## 2014-02-23 IMAGING — US US TRANSVAGINAL NON-OB
1 series · 14 of 25 positions shown · non-contrast
Comparison: None.

CLINICAL DATA: Pelvic pain

EXAM:
TRANSABDOMINAL ULTRASOUND OF PELVIS
TECHNIQUE: Transabdominal ultrasound examination of the pelvis was performed
including evaluation of the uterus, ovaries, adnexal regions, and
pelvic cul-de-sac.

[Series 1: us transvaginal non-ob · 0.25mm/px · 14 of 55 slices shown]
[im 1/55]
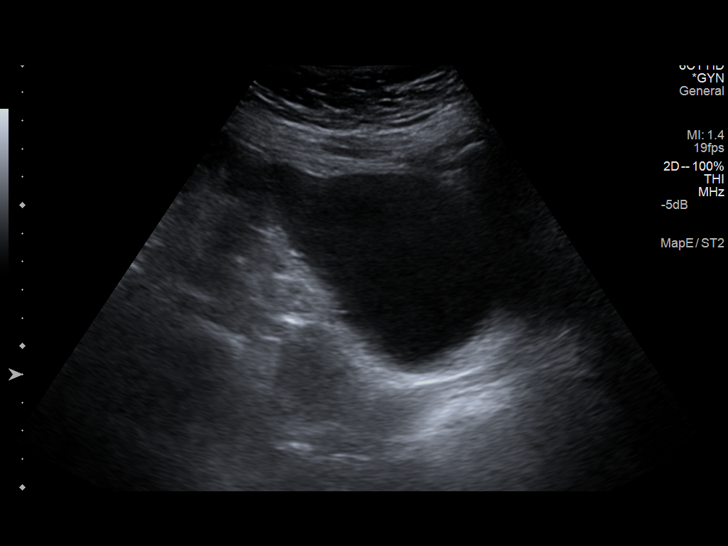
[im 5/55]
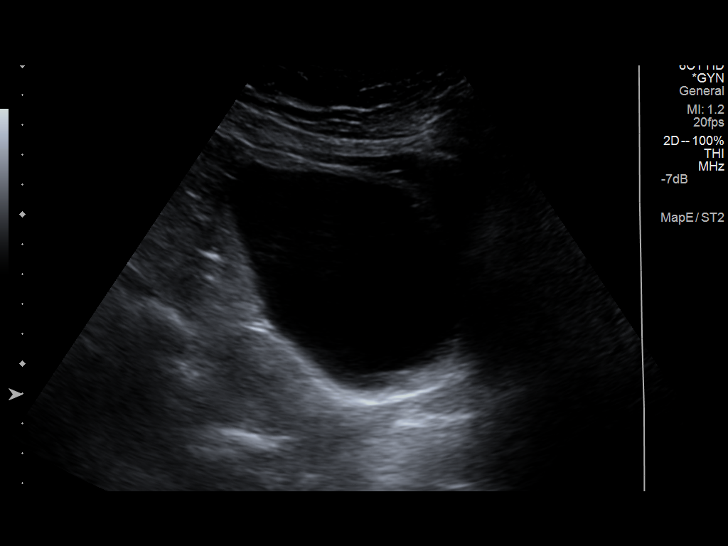
[im 10/55]
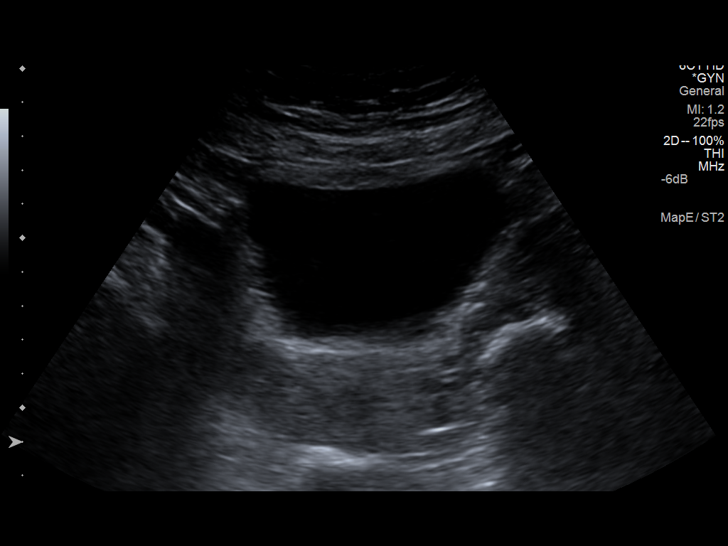
[im 14/55]
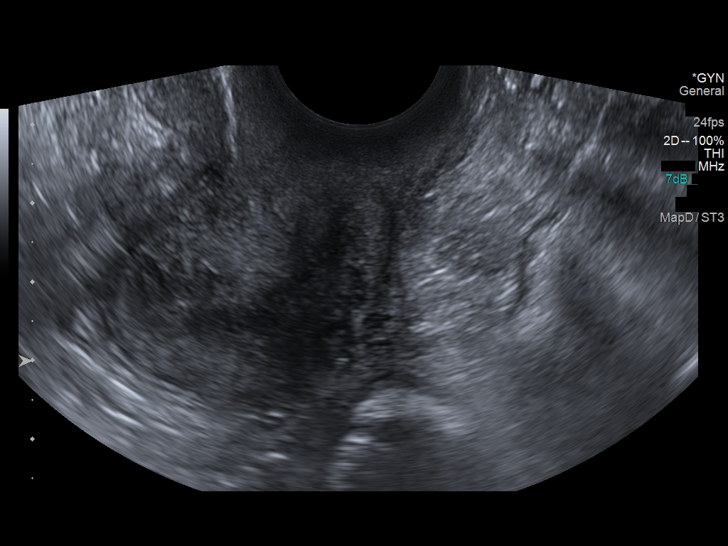
[im 19/55]
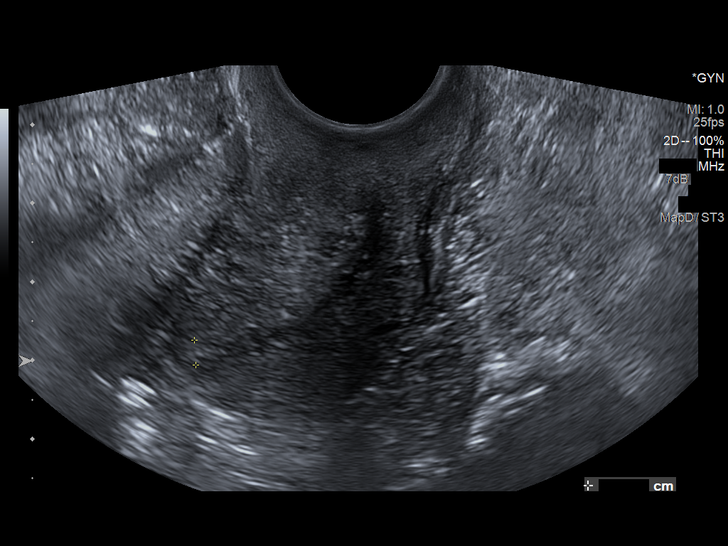
[im 21/55]
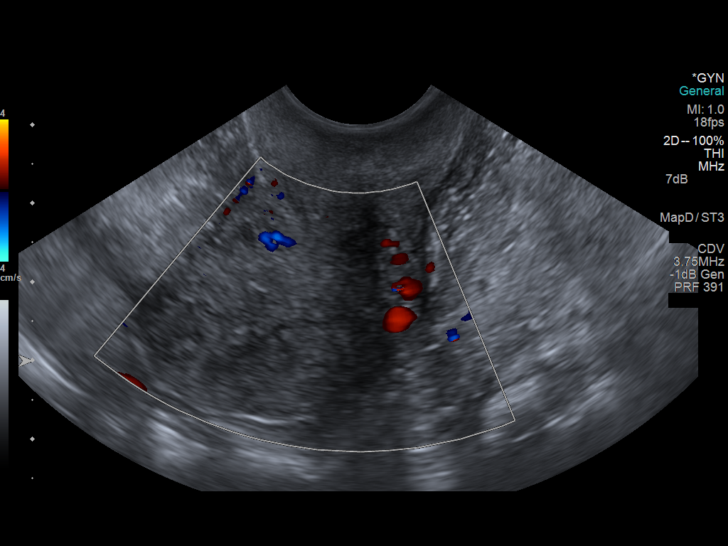
[im 25/55]
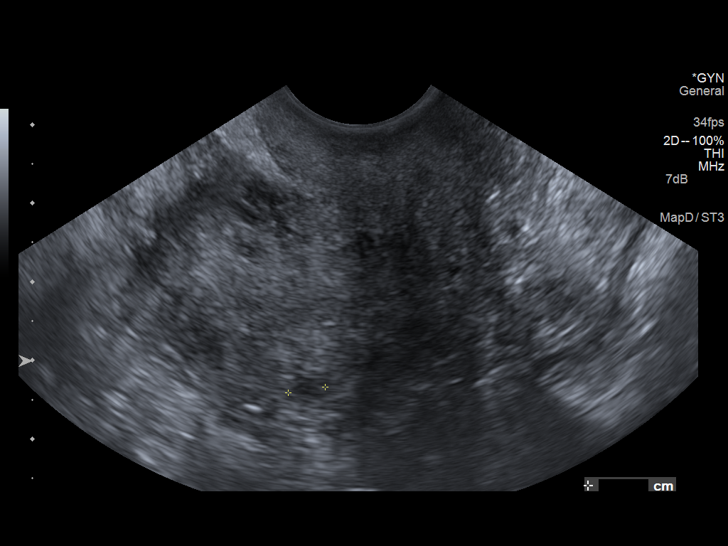
[im 30/55]
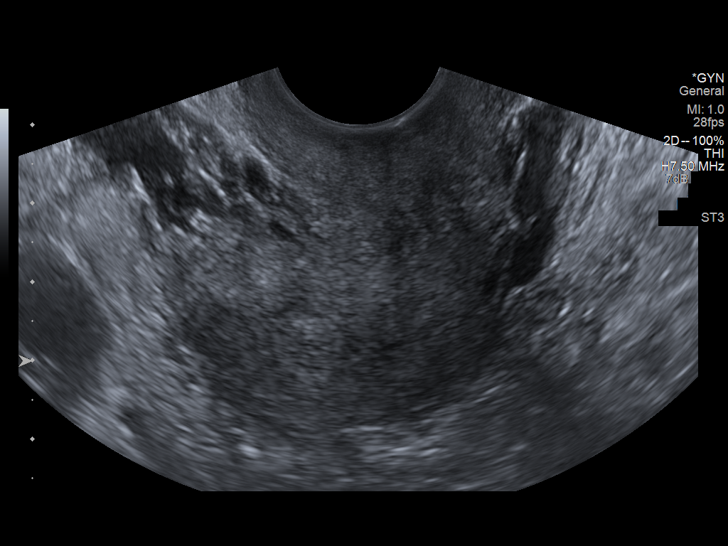
[im 34/55]
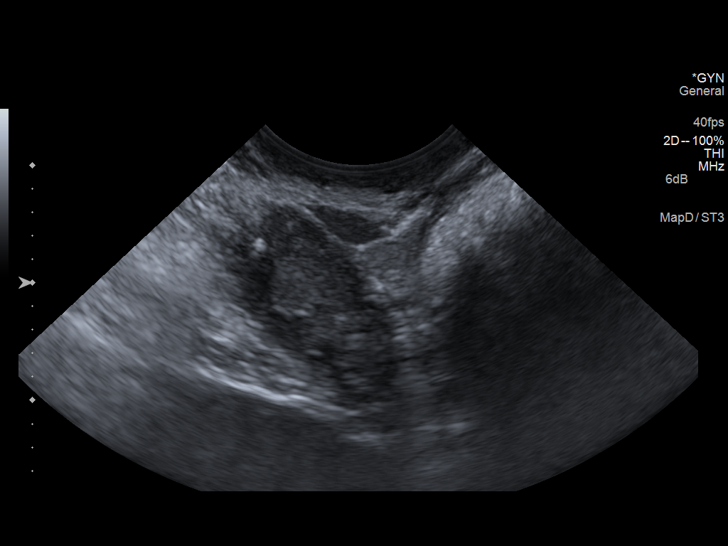
[im 37/55]
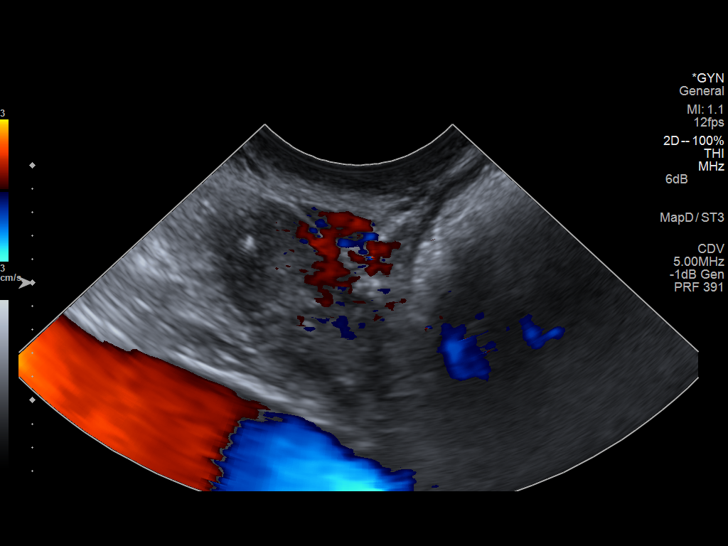
[im 41/55]
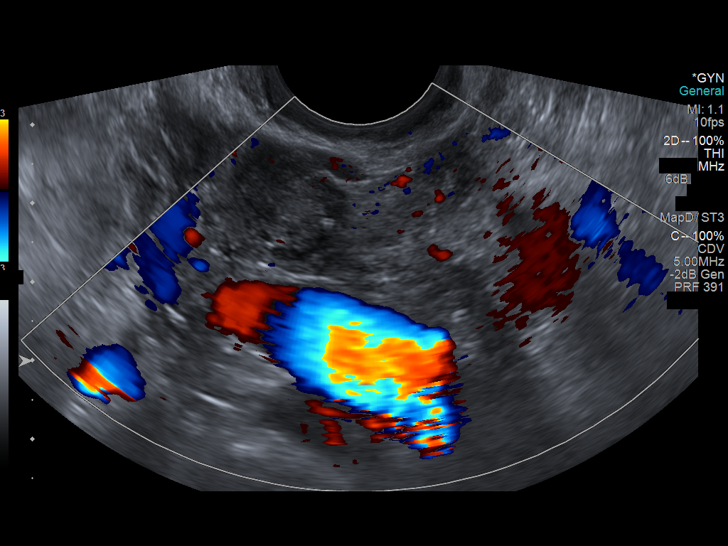
[im 46/55]
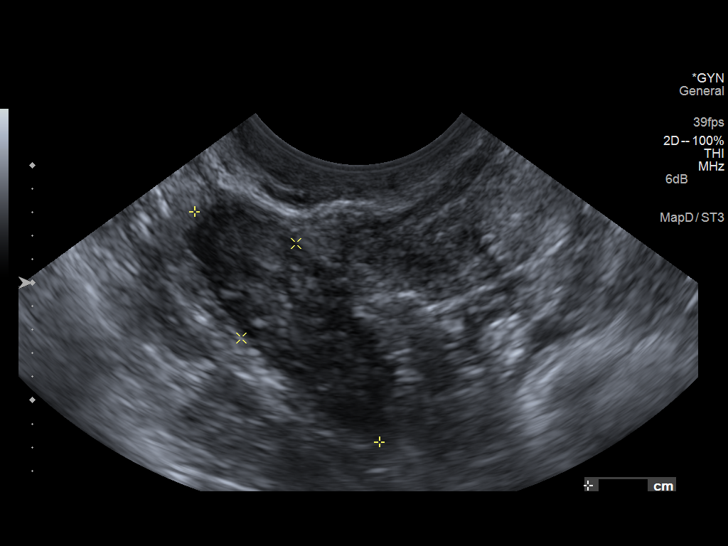
[im 50/55]
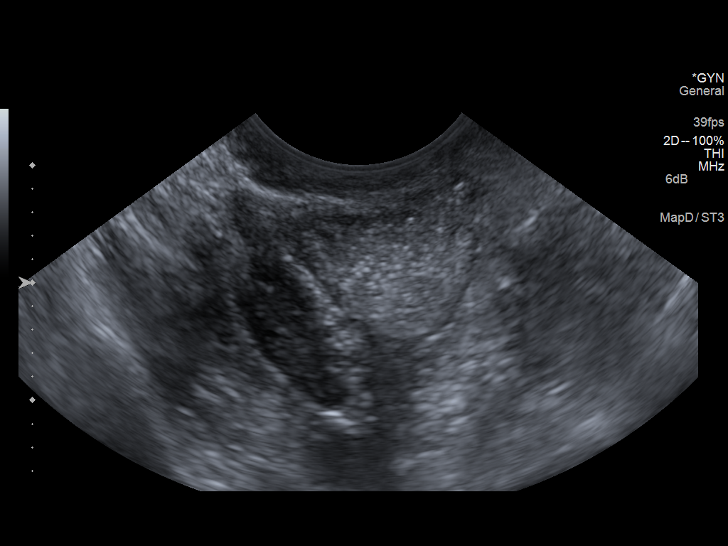
[im 55/55]
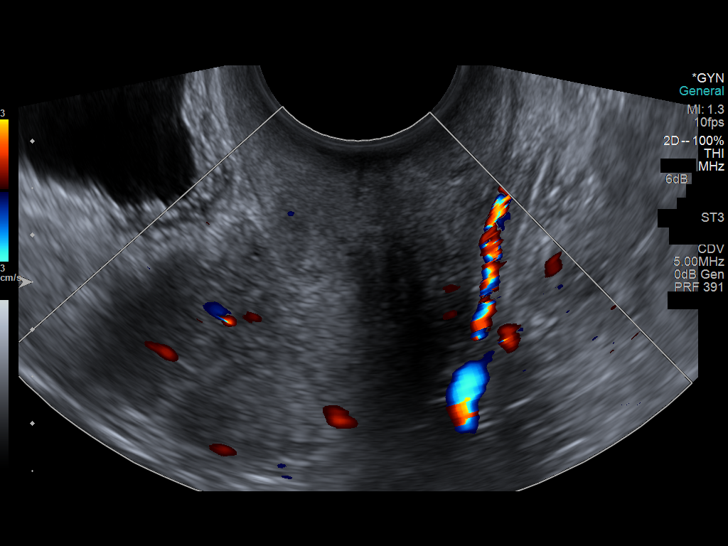

[14 of 25 positions shown; findings below may reference images not displayed]

FINDINGS: Uterus

Measurements: 5.1 x 2.5 x 4 cm. Subtle hypoechoic region in the
fundus measures 5 mm in diameter.

Endometrium

Thickness: 3 mm.  No focal abnormality visualized.

Right ovary

Measurements: 2.5 x 0.9 x 0.9 cm. Normal appearance/no adnexal mass.

Left ovary

Measurements: 2.2 x 0.9 x 1.3 cm. Normal appearance/no adnexal mass.

Other findings:  No free fluid
IMPRESSION: 1. The uterus is normal in size and contour. A subcentimeter fibroid
is suspected.
2. The ovaries and adnexal structures are normal.
3. There is no free pelvic fluid.

## 2014-02-24 NOTE — Telephone Encounter (Signed)
Patient had an appointment at [facility] for an ultrasound yesterday and her results should be available now, per patient. Patient calling to check on status of results so we can get them to [facility]. Her appointment is 02/26/14 at 8:30 there and that's when she is expecting to get her results.

## 2014-02-26 ENCOUNTER — Other Ambulatory Visit: Payer: Self-pay

## 2014-02-26 DIAGNOSIS — Z1231 Encounter for screening mammogram for malignant neoplasm of breast: Secondary | ICD-10-CM

## 2014-02-26 NOTE — Telephone Encounter (Signed)
Message left for pt to return my call on 02/25/14.  Pt did not call back. I spoke to the pt today and she states she had appt this AM and it went well.  No cause seen for bacteria in urine.  Pt does not have any further follow ups with urology.  Pt was told to call urology if she has any further issues. Advised pt to call if she needs [place].  Pt agreeable.

## 2014-03-21 ENCOUNTER — Emergency Department (HOSPITAL_COMMUNITY)
Admission: EM | Admit: 2014-03-21 | Discharge: 2014-03-21 | Disposition: A | Discharge status: Home / Self Care | Payer: Medicare HMO | Source: Home / Self Care | Attending: Emergency Medicine | Admitting: Emergency Medicine

## 2014-03-21 ENCOUNTER — Encounter (HOSPITAL_COMMUNITY): Payer: Self-pay | Admitting: Emergency Medicine

## 2014-03-21 DIAGNOSIS — T63461A Toxic effect of venom of wasps, accidental (unintentional), initial encounter: Secondary | ICD-10-CM

## 2014-03-21 DIAGNOSIS — T63441A Toxic effect of venom of bees, accidental (unintentional), initial encounter: Secondary | ICD-10-CM

## 2014-03-21 DIAGNOSIS — T6391XA Toxic effect of contact with unspecified venomous animal, accidental (unintentional), initial encounter: Secondary | ICD-10-CM

## 2014-03-21 HISTORY — DX: Chronic obstructive pulmonary disease, unspecified: J44.9

## 2014-03-21 MED ORDER — PREDNISONE 50 MG PO TABS: ORAL_TABLET | ORAL | Status: DC

## 2014-03-21 NOTE — Discharge Instructions (Signed)
You are having an allergic response to some sort of insect sting. Elevate your arm. Apply cold compress to help with the pain.  Take prednisone 50mg  daily for 5 days.  Follow up with your regular doctor on Monday or Tuesday for a recheck. If you develop worsening pain/swelling, fevers, trouble breathing, or feel like your throat is closing, please go to the emergency room.

## 2014-03-21 NOTE — ED Notes (Signed)
Reports getting stung by bee in right posterior forearm last night.  Had taken 25mg  Benadryl twice since then.  C/O pain causing difficulty sleeping, increased swelling, and redness to area.  Pt assisted in removing jewelry from R hand.  Has also applied baking soda.

## 2014-03-21 NOTE — ED Provider Notes (Addendum)
CSN: [id number]     Arrival date & time 03/21/14  2505 History   First MD Initiated Contact with Patient 03/21/14 [phone]     Chief Complaint  Patient presents with  . Insect Bite   (Consider location/radiation/quality/duration/timing/severity/associated sxs/prior Treatment) HPI She is a 68 year old woman here for evaluation of bee sting. She states yesterday evening she felt a staying on her right forearm. She did not see what stung her, but a friend said there was a bee flying around her. She developed significant pain and swelling at the site of the bee sting, this has spread to involve her hand up to her elbow. She has taken some Benadryl without much improvement. She also reports throbbing pain. She had some chest last night, but no fevers. No trouble breathing or sensation of throat closing.  Past Medical History  Diagnosis Date  . Hypertension   . Hyperlipidemia   . Glaucoma   . Post-menopausal    . Osteoporosis   . Osteopenia   . COPD (chronic obstructive pulmonary disease)    Past Surgical History  Procedure Laterality Date  . Foot surgery Right 06/2013    right 2nd toe  . Hematoma evacuation Left 2009    left calf   Family History  Problem Relation Age of Onset  . Breast cancer Mother     deceased -31's  . Heart attack Father   . Heart attack Brother    History  Substance Use Topics  . Smoking status: Never Smoker   . Smokeless tobacco: Never Used  . Alcohol Use: Not on file   OB History   Grav Para Term Preterm Abortions TAB SAB Ect Mult Living   6 1 1  5  5   1      Review of Systems  Constitutional: Positive for chills. Negative for fever.  HENT: Negative.   Respiratory: Negative.   Cardiovascular: Negative.   Gastrointestinal: Negative.   Skin:       Swelling and redness of right forearm    Allergies  Contrast media; Ether; and Iohexol  Home Medications   Prior to Admission medications   Medication Sig Start Date End Date Taking? Authorizing  Provider  albuterol (PROVENTIL) 2 MG tablet Take 2 mg by mouth at bedtime.   Yes Historical Provider, MD  aspirin 81 MG tablet Take 81 mg by mouth daily.   Yes Historical Provider, MD  budesonide-formoterol (SYMBICORT) 160-4.5 MCG/ACT inhaler Inhale 2 puffs into the lungs every morning.   Yes Historical Provider, MD  calcium carbonate (OS-CAL) 600 MG TABS tablet Take 600 mg by mouth 2 (two) times daily with a meal.   Yes Historical Provider, MD  Cholecalciferol (VITAMIN D-3) 1000 UNITS CAPS Take by mouth daily.   Yes Historical Provider, MD  Coenzyme Q10 (COQ-10 PO) Take by mouth.   Yes Historical Provider, MD  Glucosamine HCl (GLUCOSAMINE PO) Take by mouth.   Yes Historical Provider, MD  irbesartan (AVAPRO) 150 MG tablet Take 150 mg by mouth daily.   Yes Historical Provider, MD  latanoprost (XALATAN) 0.005 % ophthalmic solution Place 1 drop into both eyes at bedtime.   Yes Historical Provider, MD  Multiple Vitamins-Minerals (OCUVITE PO) Take by mouth.   Yes Historical Provider, MD  Potassium Gluconate 595 MG TBCR Take by mouth daily.   Yes Historical Provider, MD  Psyllium (METAMUCIL PO) Take by mouth.   Yes Historical Provider, MD  ranitidine (ZANTAC) 150 MG tablet Take 150 mg by mouth as needed for heartburn.  Yes Historical Provider, MD  rosuvastatin (CRESTOR) 20 MG tablet Take 20 mg by mouth daily.   Yes Historical Provider, MD  vitamin B-12 (CYANOCOBALAMIN) 250 MCG tablet Take 250 mcg by mouth daily.   Yes Historical Provider, MD  fluconazole (DIFLUCAN) 150 MG tablet Take 1 tablet (150 mg total) by mouth once. Take one tablet.  Repeat in 48 hours if symptoms are not completely resolved. 02/12/14   Milford Cage, FNP  Multiple Vitamins-Minerals (CENTRUM SILVER ADULT 50+ PO) Take by mouth daily.    Historical Provider, MD  nitrofurantoin, macrocrystal-monohydrate, (MACROBID) 100 MG capsule Take 1 capsule (100 mg total) by mouth 2 (two) times daily. 02/12/14   Milford Cage, FNP   nystatin-triamcinolone ointment (MYCOLOG) Apply 1 application topically 2 (two) times daily. 02/12/14   Mardene Celeste Rolen-Grubb, FNP  predniSONE (DELTASONE) 50 MG tablet Take 1 pill daily for 5 days. 03/21/14   Melony Overly, MD   BP 176/64  Pulse 60  Temp(Src) 98.7 F (37.1 C) (Oral)  Resp 20  SpO2 96% Physical Exam  Constitutional: She appears well-developed and well-nourished. No distress.  Skin: Skin is warm and dry.  Right forearm: well demarcated erythema and swelling from mid-dorsal hand to elbow    ED Course  Procedures (including critical care time) Labs Review Labs Reviewed - No data to display  Imaging Review No results found.   MDM   1. Allergic reaction to bee sting, accidental or unintentional, initial encounter    History and exam consistent with a large local reaction to bee sting. Cellulitis is in the differential, but time course is more consistent with allergic reaction. Given her discomfort, will treat with a prednisone burst. Also recommended elevation and cool compresses. Followup with PCP in 2 days for recheck. If she develops fevers, worsening pain or swelling, trouble breathing, throat closing she should go to the emergency room.    Melony Overly, MD 03/21/14 [place], MD 03/21/14 1018

## 2014-04-06 ENCOUNTER — Ambulatory Visit
Admission: RE | Admit: 2014-04-06 | Discharge: 2014-04-06 | Disposition: A | Discharge status: Home / Self Care | Payer: Medicare HMO | Source: Ambulatory Visit

## 2014-04-06 ENCOUNTER — Encounter (INDEPENDENT_AMBULATORY_CARE_PROVIDER_SITE_OTHER): Payer: Self-pay

## 2014-04-06 DIAGNOSIS — Z1231 Encounter for screening mammogram for malignant neoplasm of breast: Secondary | ICD-10-CM

## 2014-04-22 ENCOUNTER — Encounter: Payer: Self-pay | Admitting: Nurse Practitioner

## 2014-04-22 ENCOUNTER — Telehealth: Payer: Self-pay | Admitting: Nurse Practitioner

## 2014-04-22 ENCOUNTER — Ambulatory Visit (INDEPENDENT_AMBULATORY_CARE_PROVIDER_SITE_OTHER): Payer: Medicare HMO | Admitting: Nurse Practitioner

## 2014-04-22 VITALS — BP 144/92 | HR 68 | Ht 66.25 in | Wt 202.0 lb

## 2014-04-22 DIAGNOSIS — Z1211 Encounter for screening for malignant neoplasm of colon: Secondary | ICD-10-CM

## 2014-04-22 DIAGNOSIS — M810 Age-related osteoporosis without current pathological fracture: Secondary | ICD-10-CM

## 2014-04-22 DIAGNOSIS — Z01419 Encounter for gynecological examination (general) (routine) without abnormal findings: Secondary | ICD-10-CM

## 2014-04-22 NOTE — Patient Instructions (Addendum)

## 2014-04-22 NOTE — Telephone Encounter (Signed)
Left message for pt to call regarding records release. Where did she have colonoscopy done because our office does not have a copy on file to send to Dr Mellody Drown.

## 2014-04-22 NOTE — Progress Notes (Signed)
68 y.o. [id number] Widowed Caucasian Fe here for annual exam. No further vaginal symptoms.  She has since then used a jock itch spray.      No LMP recorded. Patient is postmenopausal.          Sexually active: No.  The current method of family planning is post menopausal status.    Exercising: Yes.    Gym/ health club routine includes low impact aerobics. Smoker:  no  Health Maintenance: Pap:  08/15/2012 Neg HR HPV MMG:  04/06/2014 Bi-Rads neg Colonoscopy:  07/29/2009, WNL repeat in 10 years BMD:   08/19/2012 T Score: -2.7/-0.7   -2.3/-0.6 TDaP:  2007 Shingles vaccine 2 years ago Pneumovax 2008 Labs: PCP    UA: PCP   reports that she has never smoked. She has never used smokeless tobacco. She reports that she does not use illicit drugs.  Past Medical History  Diagnosis Date  . Hypertension   . Hyperlipidemia   . Glaucoma   . Post-menopausal    . Osteoporosis   . Osteopenia   . COPD (chronic obstructive pulmonary disease)     Past Surgical History  Procedure Laterality Date  . Foot surgery Right 06/2013    right 2nd toe  . Hematoma evacuation Left 2009    left calf    Current Outpatient Prescriptions  Medication Sig Dispense Refill  . aspirin 81 MG tablet Take 81 mg by mouth daily.      . budesonide-formoterol (SYMBICORT) 160-4.5 MCG/ACT inhaler Inhale 2 puffs into the lungs every morning.      . calcium carbonate (OS-CAL) 600 MG TABS tablet Take 600 mg by mouth 2 (two) times daily with a meal.      . Cholecalciferol (VITAMIN D-3) 1000 UNITS CAPS Take by mouth daily.      . Coenzyme Q10 (COQ-10 PO) Take by mouth.      . Glucosamine HCl (GLUCOSAMINE PO) Take by mouth.      . irbesartan (AVAPRO) 150 MG tablet Take 150 mg by mouth daily.      Marland Kitchen latanoprost (XALATAN) 0.005 % ophthalmic solution Place 1 drop into both eyes at bedtime.      . Multiple Vitamins-Minerals (CENTRUM SILVER ADULT 50+ PO) Take by mouth daily.      . Multiple Vitamins-Minerals (OCUVITE PO) Take by mouth.       . nystatin-triamcinolone ointment (MYCOLOG) Apply 1 application topically 2 (two) times daily.  30 g  0  . Potassium Gluconate 595 MG TBCR Take by mouth daily.      . Psyllium (METAMUCIL PO) Take by mouth.      . ranitidine (ZANTAC) 150 MG tablet Take 150 mg by mouth as needed for heartburn.      . rosuvastatin (CRESTOR) 20 MG tablet Take 20 mg by mouth daily.      . vitamin B-12 (CYANOCOBALAMIN) 250 MCG tablet Take 250 mcg by mouth daily.       No current facility-administered medications for this visit.    Family History  Problem Relation Age of Onset  . Breast cancer Mother     deceased -75's  . Heart attack Father   . Heart attack Brother     ROS:  Pertinent items are noted in HPI.  Otherwise, a comprehensive ROS was negative.  Exam:   BP 144/92  Pulse 68  Ht 5' 6.25" (1.683 m)  Wt 202 lb (91.627 kg)  BMI 32.35 kg/m2 Height: 5' 6.25" (168.3 cm)  Ht Readings from Last 3  Encounters:  04/22/14 5' 6.25" (1.683 m)  02/12/14 5' 6.25" (1.683 m)  12/23/13 5' 6.25" (1.683 m)    General appearance: alert, cooperative and appears stated age Head: Normocephalic, without obvious abnormality, atraumatic Neck: no adenopathy, supple, symmetrical, trachea midline and thyroid normal to inspection and palpation Lungs: clear to auscultation bilaterally Breasts: normal appearance, no masses or tenderness Heart: regular rate and rhythm Abdomen: soft, non-tender; no masses,  no organomegaly Extremities: extremities normal, atraumatic, no cyanosis or edema Skin: Skin color, texture, turgor normal. No rashes or lesions Lymph nodes: Cervical, supraclavicular, and axillary nodes normal. No abnormal inguinal nodes palpated Neurologic: Grossly normal   Pelvic: External genitalia:  no lesions              Urethra:  normal appearing urethra with no masses, tenderness or lesions              Bartholin's and Skene's: normal                 Vagina: normal appearing vagina with normal color and  discharge, no lesions              Cervix: anteverted              Pap taken: No. Bimanual Exam:  Uterus:  normal size, contour, position, consistency, mobility, non-tender              Adnexa: no mass, fullness, tenderness               Rectovaginal: Confirms               Anus:  normal sphincter tone, no lesions  A:  Well Woman with normal exam  Postmenopausal  Osteoporosis - followed by PCP  HTN  hypercholesterolemia  COPD    P:   Reviewed health and wellness pertinent to exam  Pap smear not taken today  Mammogram is due 8/16  IFOB is given  Counseled on breast self exam, mammography screening, osteoporosis, adequate intake of calcium and vitamin D, diet and exercise, Kegel's exercises return annually or prn  An After Visit Summary was printed and given to the patient.

## 2014-04-26 NOTE — Progress Notes (Signed)
Encounter reviewed by Dr. Brook Silva.  

## 2014-04-29 LAB — FECAL OCCULT BLOOD, IMMUNOCHEMICAL: IFOBT: NEGATIVE

## 2014-04-29 NOTE — Addendum Note (Signed)
Addended by: Abelino Derrick C on: 04/29/2014 11:01 AM   Modules accepted: Orders

## 2014-06-15 ENCOUNTER — Encounter: Payer: Self-pay | Admitting: Nurse Practitioner

## 2015-02-10 ENCOUNTER — Encounter: Payer: Self-pay | Admitting: Internal Medicine

## 2015-03-08 ENCOUNTER — Other Ambulatory Visit: Payer: Self-pay

## 2015-03-08 DIAGNOSIS — Z1231 Encounter for screening mammogram for malignant neoplasm of breast: Secondary | ICD-10-CM

## 2015-04-09 ENCOUNTER — Ambulatory Visit
Admission: RE | Admit: 2015-04-09 | Discharge: 2015-04-09 | Disposition: A | Discharge status: Home / Self Care | Payer: Medicare HMO | Source: Ambulatory Visit

## 2015-04-09 DIAGNOSIS — Z1231 Encounter for screening mammogram for malignant neoplasm of breast: Secondary | ICD-10-CM

## 2015-07-26 ENCOUNTER — Encounter: Payer: Self-pay | Admitting: Nurse Practitioner

## 2015-07-26 ENCOUNTER — Ambulatory Visit (INDEPENDENT_AMBULATORY_CARE_PROVIDER_SITE_OTHER): Payer: Medicare HMO | Admitting: Nurse Practitioner

## 2015-07-26 ENCOUNTER — Ambulatory Visit: Payer: Medicare HMO | Admitting: Nurse Practitioner

## 2015-07-26 VITALS — BP 132/80 | HR 60 | Ht 65.0 in | Wt 202.0 lb

## 2015-07-26 DIAGNOSIS — Z0000 Encounter for general adult medical examination without abnormal findings: Secondary | ICD-10-CM

## 2015-07-26 DIAGNOSIS — Z1211 Encounter for screening for malignant neoplasm of colon: Secondary | ICD-10-CM | POA: Diagnosis not present

## 2015-07-26 DIAGNOSIS — N904 Leukoplakia of vulva: Secondary | ICD-10-CM | POA: Diagnosis not present

## 2015-07-26 DIAGNOSIS — Z01419 Encounter for gynecological examination (general) (routine) without abnormal findings: Secondary | ICD-10-CM

## 2015-07-26 DIAGNOSIS — R234 Changes in skin texture: Secondary | ICD-10-CM | POA: Diagnosis not present

## 2015-07-26 MED ORDER — CLOBETASOL PROPIONATE 0.05 % EX OINT: 1.0000 "application " | TOPICAL_OINTMENT | Freq: Two times a day (BID) | CUTANEOUS | Status: DC

## 2015-07-26 NOTE — Patient Instructions (Signed)

## 2015-07-26 NOTE — Progress Notes (Signed)
Patient ID: [name], female   DOB: 1946-02-16, 69 y.o.   MRN: [id number]   69 y.o. [id number] Widowed  Caucasian Fe here for annual exam.  She celebrates her Rudene Anda tomorrow!  The main complaint is a painful and often times raw areas around her anal areas.  last July she had a yeast infection and had been using some antifungals.  Now does not think this is the same and frankly is more concerned about cancer.  She has used sitz baths and changed to the mildest soap and laundry detergent.  Patient's last menstrual period was 08/14/1985.          Sexually active: No.  The current method of family planning is post menopausal status.    Exercising: Yes.    hasn't been able to go to gym due to knee pain Smoker:  no  Health Maintenance: Pap:08/15/2012, Negative with neg HR HPV MMG: 04/09/15, 3D, Bi-Rads 1: negative Colonoscopy:07/29/2009, normal, repeat in 10 years BMD:08/19/2012 T Score -2.7 Spine / -2.3 Left Femur Neck TDaP: 2007 Shingles vaccine 2 years ago Pneumovax 2008 Labs: PCP    UA: PCP   reports that she has never smoked. She has never used smokeless tobacco. She reports that she does not use illicit drugs.  Past Medical History  Diagnosis Date  . Hypertension   . Hyperlipidemia   . Glaucoma   . Post-menopausal    . Osteoporosis   . Osteopenia   . COPD (chronic obstructive pulmonary disease) [facility])     Past Surgical History  Procedure Laterality Date  . Foot surgery Right 06/2013    right 2nd toe  . Hematoma evacuation Left 2009    left calf    Current Outpatient Prescriptions  Medication Sig Dispense Refill  . albuterol (PROAIR HFA) 108 (90 BASE) MCG/ACT inhaler Inhale 2 puffs into the lungs every 6 (six) hours as needed for wheezing or shortness of breath.    Marland Kitchen aspirin 81 MG tablet Take 81 mg by mouth daily.    Marland Kitchen atorvastatin (LIPITOR) 20 MG tablet Take 1 tablet by mouth at bedtime.  11  . budesonide-formoterol (SYMBICORT) 160-4.5 MCG/ACT inhaler Inhale 2  puffs into the lungs every morning.    . calcium carbonate (OS-CAL) 600 MG TABS tablet Take 600 mg by mouth 2 (two) times daily with a meal.    . Cholecalciferol (VITAMIN D-3) 1000 UNITS CAPS Take by mouth daily.    . Coenzyme Q10 (COQ-10 PO) Take by mouth.    . Glucosamine HCl (GLUCOSAMINE PO) Take by mouth.    . irbesartan (AVAPRO) 150 MG tablet Take 150 mg by mouth daily.    Marland Kitchen latanoprost (XALATAN) 0.005 % ophthalmic solution Place 1 drop into both eyes at bedtime.    . Multiple Vitamins-Minerals (CENTRUM SILVER ADULT 50+ PO) Take by mouth daily.    . Multiple Vitamins-Minerals (ICAPS MV PO) Take 1 tablet by mouth daily.    . Omega-3 Fatty Acids (FISH OIL) 1000 MG CAPS Take 1 capsule by mouth daily.    . Psyllium (METAMUCIL PO) Take by mouth.    . ranitidine (ZANTAC) 150 MG tablet Take 150 mg by mouth as needed for heartburn.    . vitamin B-12 (CYANOCOBALAMIN) 250 MCG tablet Take 250 mcg by mouth daily.    . vitamin C (ASCORBIC ACID) 500 MG tablet Take 500 mg by mouth 2 (two) times daily.    . vitamin E 400 UNIT capsule Take 400 Units by mouth daily.    Marland Kitchen  clobetasol ointment (TEMOVATE) [id number] % Apply 1 application topically 2 (two) times daily. 60 g 0   No current facility-administered medications for this visit.    Family History  Problem Relation Age of Onset  . Breast cancer Mother     deceased -70's  . Heart attack Father   . Heart attack Brother     ROS:  Pertinent items are noted in HPI.  Otherwise, a comprehensive ROS was negative.  Exam:   BP 132/80 mmHg  Pulse 60  Ht 5\' 5"  (1.651 m)  Wt 202 lb (91.627 kg)  BMI 33.61 kg/m2  LMP 08/14/1985 Height: 5\' 5"  (165.1 cm) Ht Readings from Last 3 Encounters:  07/26/15 5\' 5"  (1.651 m)  04/22/14 5' 6.25" (1.683 m)  02/12/14 5' 6.25" (1.683 m)    General appearance: alert, cooperative and appears stated age Head: Normocephalic, without obvious abnormality, atraumatic Neck: no adenopathy, supple, symmetrical, trachea midline  and thyroid normal to inspection and palpation Lungs: clear to auscultation bilaterally Breasts: normal appearance, no masses or tenderness Heart: regular rate and rhythm Abdomen: soft, non-tender; no masses,  no organomegaly Extremities: extremities normal, atraumatic, no cyanosis or edema Skin: Skin color, texture, turgor normal. No rashes or lesions except for a change of lesions right lower leg and right shoulder anterior and posterior - could be a basal cell on the leg Lymph nodes: Cervical, supraclavicular, and axillary nodes normal. No abnormal inguinal nodes palpated Neurologic: Grossly normal   Pelvic: External genitalia:  Multiple areas of hypopigmentation that is new from last years exam.  Areas radiate to the rectal and buttock that are C/W LSA.              Urethra:  normal appearing urethra with no masses, tenderness or lesions              Bartholin's and Skene's: normal                 Vagina: normal appearing vagina with normal color and discharge, no lesions              Cervix: anteverted              Pap taken: No. Bimanual Exam:  Uterus:  normal size, contour, position, consistency, mobility, non-tender              Adnexa: no mass, fullness, tenderness               Rectovaginal: Confirms               Anus:  normal sphincter tone, no lesions      Chaperone present: no  A:  Well Woman with normal exam  Postmenopausal Osteoporosis - followed by PCP HTN hypercholesterolemia COPD  R/O LSA - will get biopsy soon  Changes of skin lesion right leg and shoulder - will get Derm referral    P:   Reviewed health and wellness pertinent to exam  Pap smear as above  Mammogram is due 03/2016  Will get biopsy of vulva, perirectal area.  In the interim will put her on Clobetasol.  She will have a new insurance in January -so may need to get procedure done then  Order is placed for Derm referral for skin  changes  IFOB is given  Counseled on breast self exam, mammography screening, adequate intake of calcium and vitamin D, diet and exercise, Kegel's exercises return annually or prn  An After Visit Summary was printed and given to the  patient.

## 2015-07-28 NOTE — Progress Notes (Signed)
Encounter reviewed Shahiem Bedwell, MD   

## 2015-07-30 ENCOUNTER — Telehealth: Payer: Self-pay | Admitting: Nurse Practitioner

## 2015-07-30 NOTE — Telephone Encounter (Signed)
Spoke with patient today regarding referral information. Patient understood and agreeable. While on the call, patient asked about biopsy order. Patient states she is starting new insurance in January but does not have information yet. Advised patient to call in January once she receives her information so insurance department may precert her biopsy. Patient agreeable and will call with information. Order deferred until 08/17/15.   Routing to clinical for review prior to closing.

## 2015-07-30 NOTE — Telephone Encounter (Signed)
Spoke with patient regarding referral to dermatology. Patient understood all information for scheduling at Dr Ledell Peoples office. Patient aware to call Dr Ledell Peoples office for rescheduling. No further questions. Ok to close.

## 2015-07-30 NOTE — Telephone Encounter (Signed)
Office visit note from Kem Boroughs, FNP reviewed: Will get biopsy of vulva, perirectal area. In the interim will put her on Clobetasol. She will have a new insurance in January -so may need to get procedure done then.   Provider aware may need to plan for January. Can schedule with Dr. Quincy Simmonds. Scheduled with Derm for end of January.  Please call patient when order renews to schedule.  Routing to Kem Boroughs, FNP cc Dr. Quincy Simmonds.  Will close encounter.

## 2015-08-12 LAB — FECAL OCCULT BLOOD, IMMUNOCHEMICAL: IFOBT: NEGATIVE

## 2015-08-12 NOTE — Addendum Note (Signed)
Addended by: Lowella Fairy on: 08/12/2015 10:13 AM   Modules accepted: Orders

## 2015-08-15 DIAGNOSIS — C4490 Unspecified malignant neoplasm of skin, unspecified: Secondary | ICD-10-CM

## 2015-08-15 DIAGNOSIS — L900 Lichen sclerosus et atrophicus: Secondary | ICD-10-CM

## 2015-08-15 HISTORY — DX: Unspecified malignant neoplasm of skin, unspecified: C44.90

## 2015-08-15 HISTORY — DX: Lichen sclerosus et atrophicus: L90.0

## 2015-08-18 ENCOUNTER — Telehealth: Payer: Self-pay | Admitting: Nurse Practitioner

## 2015-08-18 NOTE — Telephone Encounter (Signed)
Called patient to obtain new insurance information in order to verify benefits for a recommended procedure. Left Voicemail requesting a call.

## 2015-08-18 NOTE — Telephone Encounter (Signed)
Patient called and said, "Please tell [name] I will need something before 08/31/15 or way after. I just got called for jury duty on 08/31/15."

## 2015-08-18 NOTE — Telephone Encounter (Signed)
Patient calling to speak with [facility]. She now has [facility] Complete Member # [id number] Group [id number] phone Number [phone]

## 2015-09-03 NOTE — Telephone Encounter (Signed)
Patient calling to check on an appointment for biopsy.

## 2015-09-03 NOTE — Telephone Encounter (Signed)
Spoke with patient. She would like to proceed with scheduling her vulvar biopsy at this time. Appointment scheduled for 09/10/2015 at 10 am with Dr.Silva. Patient is agreeable to date and time. Patient will need to be called with insurance benefits information. Order has been placed.  Cc: Lerry Liner

## 2015-09-05 NOTE — Telephone Encounter (Signed)
Thank you for the update.  I am happy to see the patient for her biopsy.

## 2015-09-07 NOTE — Telephone Encounter (Signed)
Patient is calling to see if after the procedure if she will be able to drive. Best # to [phone]

## 2015-09-07 NOTE — Telephone Encounter (Signed)
Call to patient on home number. No answer.  Call to patient on Mobile, Message left to return call to [place] at [phone].  designated party release form does not list Mobile as a number that detailed messages can be left on.   If patient returns call, she does need need a driver after biopsy.

## 2015-09-08 NOTE — Telephone Encounter (Signed)
Left message to call Kaitlyn at [phone]. 

## 2015-09-09 NOTE — Telephone Encounter (Signed)
Spoke with patient. Advised she will not need a driver after her vulvar biopsy. Advised if she would feel more comfortable having a driver she may bring someone with her to drive after. Patient is agreeable.  Routing to provider for final review. Patient agreeable to disposition. Will close encounter.

## 2015-09-10 ENCOUNTER — Encounter: Payer: Self-pay | Admitting: Obstetrics and Gynecology

## 2015-09-10 ENCOUNTER — Ambulatory Visit (INDEPENDENT_AMBULATORY_CARE_PROVIDER_SITE_OTHER): Payer: Medicare Other | Admitting: Obstetrics and Gynecology

## 2015-09-10 VITALS — BP 110/70 | HR 60 | Ht 65.0 in | Wt 205.2 lb

## 2015-09-10 DIAGNOSIS — N9089 Other specified noninflammatory disorders of vulva and perineum: Secondary | ICD-10-CM | POA: Diagnosis not present

## 2015-09-10 DIAGNOSIS — N904 Leukoplakia of vulva: Secondary | ICD-10-CM | POA: Diagnosis not present

## 2015-09-10 MED ORDER — CLOBETASOL PROPIONATE 0.05 % EX OINT: 1.0000 "application " | TOPICAL_OINTMENT | Freq: Two times a day (BID) | CUTANEOUS | Status: DC

## 2015-09-10 NOTE — Progress Notes (Signed)
Subjective:     Patient ID: [name], female   DOB: 12/09/1945, 70 y.o.   MRN: [id number]  HPI  Patient here today for vulvar biopsy. Vulvar changes noted at annual exam with [name] on 07/26/15. Received Rx for Clobetasol ointment and ran out.  Was using once a day.  No real change in symptoms.   Wondering if she has hemorrhoids.  Painful bowel movements.   Review of Systems  [id number] Contraception: Postmenopausal Last Pap: 08-15-12 Neg:Neg HR HPV Last Mammogram: 04-12-15 3D/Density Cat.B/Neg/BiRads1:[facility]     Objective:   Physical Exam  Genitourinary:     Vulvar biopsy.  Consent for procedure.  Sterile prep with Hibiclens.  Local 1% lidocaine to right and left buttock skin.  Lot [id number], expiration 10/12/16. 3 mm punch biopsy to left buttock skin and then right buttock skin, each to path separately.  AgNO3 used and then single suture of 3/0 Vicryl to each biopsy site due to minor oozing.  No complications.  Minimal EBL.       Assessment:     Vulvar lesions.  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      Plan:     Discussion of lichen sclerosus.  Follow up biopsy results.  Instructions and precautions regarding biopsy sites.  Refill of clobetasol ointment.  Use twice a day and apply in a thin layer.  Follow up in one month for a recheck appointment.   _____15__ minutes face to face time of which over 50% was spent in counseling.   After visit summary to patient.

## 2015-09-10 NOTE — Patient Instructions (Signed)
Lichen Sclerosus Lichen sclerosus is a skin problem. It can happen on any part of the body, but it commonly involves the anal or genital areas. It can cause itching and discomfort in these areas. Treatment can help to control symptoms. When the genital area is affected, getting treatment is important because the condition can cause scarring that may lead to other problems. CAUSES The cause of this condition is not known. It could be the result of an overactive immune system or a lack of certain hormones. Lichen sclerosus is not an infection or a fungus. It is not passed from one person to another (not contagious). RISK FACTORS This condition is more likely to develop in women, usually after menopause. SYMPTOMS Symptoms of this condition include:  Thin, wrinkled, white areas on the skin.  Thickened white areas on the skin.  Red and swollen patches (lesions) on the skin.  Tears or cracks in the skin.  Bruising.  Blood blisters.  Severe itching. You may also have pain, itching, or burning with urination. Constipation is also common in people with lichen sclerosus. DIAGNOSIS This condition may be diagnosed with a physical exam. In some cases, a tissue sample (biopsy sample) may be removed to be looked at under a microscope. TREATMENT This condition is usually treated with medicated creams or ointments (topical steroids) that are applied over the affected areas. HOME CARE INSTRUCTIONS  Take over-the-counter and prescription medicines only as told by your health care provider.  Use creams or ointments as told by your health care provider.  Do not scratch the affected areas of skin.  Women should keep the vaginal area as clean and dry as possible.  Keep all follow-up visits as told by your health care provider. This is important. SEEK MEDICAL CARE IF:  You have increasing redness, swelling, or pain in the affected area.  You have fluid, blood, or pus coming from the affected  area.  You have new lesions on your skin.  You have pain or burning with urination.  You have pain during sex.   This information is not intended to replace advice given to you by your health care provider. Make sure you discuss any questions you have with your health care provider.   Document Released: 12/21/2010 Document Revised: 04/21/2015 Document Reviewed: 10/26/2014 Elsevier Interactive Patient Education 2016 [place] POST-PROCEDURE INSTRUCTIONS  1. You may take Ibuprofen, Aleve or Tylenol for pain if needed.    2. You may have a small amount of spotting.  You should wear a mini pad for the next few days.  3. You may use some topical Neosporin ointment if you would like (over the counter is fine).  4. You need to call if you have redness around the biopsy site, if there is any unusual draining, if the bleeding is heavy, or if you are concerned.  5. Shower or bathe as normal  6. We will call you within one week with results or we will discuss the results at your follow-up appointment if needed.

## 2015-09-14 LAB — IPS OTHER TISSUE BIOPSY

## 2015-09-16 ENCOUNTER — Telehealth: Payer: Self-pay

## 2015-09-16 NOTE — Telephone Encounter (Signed)
-----   Message from Nunzio Cobbs, MD sent at 09/14/2015  4:09 PM EST ----- Please inform the patient of her biopsy results which confirm the diagnosis of lichen sclerosus.  This is what we were expecting.  She already had a prescription of Clobetasol ointment from [facility].  I would like to have her use this twice a day, in a thin layer, to skin for one month. She is to place on the labia minor (internal labia) and then in a ring around the entire anus (including the area where she thought she has a hemorrhoid).  I told her she does not have a hemorrhoid, but has ulceration from the lichen sclerosus.   Please schedule a recheck with me for 4 weeks.   Cc - Patty Gabriela Eves

## 2015-09-16 NOTE — Telephone Encounter (Signed)
Spoke with patient. Advised of results as seen below from [place]. She is agreeable and verbalizes understanding. States she has been drinking shakes in the morning with prunes to make it easier to use the restroom. "It burns when the stool touches the area. I want to scream." Advised to make sure her bottom is clean and dry before applying the Clobetasol ointment. Advised applying the Clobetasol ointment twice per day will help to relieve the irritation she is experiencing. She would like to know if she can use baby wipes that are alcohol free and non scented. Advised I will speak with Dr.Silva regarding her recommendations and return call. She is agreeable.

## 2015-09-16 NOTE — Telephone Encounter (Signed)
The baby wipes are a good idea so she does not have to apply to much pressure to the area for cleaning! I really want her to use the Clobetasol, because this will help to heal the ulceration in this area.  Use cotton underwear. Use Dove soap.  I will see her back in one month.

## 2015-09-17 ENCOUNTER — Ambulatory Visit (INDEPENDENT_AMBULATORY_CARE_PROVIDER_SITE_OTHER): Payer: Medicare Other | Admitting: Obstetrics and Gynecology

## 2015-09-17 ENCOUNTER — Telehealth: Payer: Self-pay | Admitting: Emergency Medicine

## 2015-09-17 ENCOUNTER — Encounter: Payer: Self-pay | Admitting: Obstetrics and Gynecology

## 2015-09-17 VITALS — BP 110/82 | HR 60 | Ht 65.0 in | Wt 200.4 lb

## 2015-09-17 DIAGNOSIS — R319 Hematuria, unspecified: Secondary | ICD-10-CM | POA: Diagnosis not present

## 2015-09-17 DIAGNOSIS — R300 Dysuria: Secondary | ICD-10-CM

## 2015-09-17 LAB — POCT URINALYSIS DIPSTICK
BILIRUBIN UA: NEGATIVE
KETONES UA: NEGATIVE
Nitrite, UA: NEGATIVE
PH UA: 5
Protein, UA: NEGATIVE
Urobilinogen, UA: NEGATIVE

## 2015-09-17 LAB — URINALYSIS, MICROSCOPIC ONLY
BACTERIA UA: NONE SEEN [HPF]
CASTS: NONE SEEN [LPF]
CRYSTALS: NONE SEEN [HPF]
YEAST: NONE SEEN [HPF]

## 2015-09-17 MED ORDER — CIPROFLOXACIN HCL 500 MG PO TABS: 500.0000 mg | ORAL_TABLET | Freq: Two times a day (BID) | ORAL | Status: DC

## 2015-09-17 NOTE — Telephone Encounter (Signed)
Patient needs to be seen today for an appointment.

## 2015-09-17 NOTE — Telephone Encounter (Signed)
Patient returned call and she is given results from Dr. Quincy Simmonds.  She has a follow up appointment scheduled for 10/20/15 with Dr. Quincy Simmonds.  Will call back with any questions, concerns or worsening symptoms.  Routing to provider for final review. Patient agreeable to disposition. Will close encounter.

## 2015-09-17 NOTE — Telephone Encounter (Signed)
Spoke with patient. Advised of message as seen below from [name]. She is agreeable and verbalizes understanding. States she woke up in the middle of the night and had blood in her urine. Reports burning with urination that started yesterday. Denies any fevers, chills, or lower back pain. States she had recurrent urinary tract infections last year and was seen at [facility]. She was given "a box" of Urogesic to take as needed with UTI symptoms. Reports she took one pill last night and symptoms persisted this morning. She states the instructions state to take 4 tablets per day, but do not indicate for how long. Advised I will speak with Dr.Silva regarding recommendations and return call. She is agreeable.

## 2015-09-17 NOTE — Telephone Encounter (Signed)
-----   Message from Nunzio Cobbs, MD sent at 09/14/2015  4:09 PM EST ----- Please inform the patient of her biopsy results which confirm the diagnosis of lichen sclerosus.  This is what we were expecting.  She already had a prescription of Clobetasol ointment from [facility].  I would like to have her use this twice a day, in a thin layer, to skin for one month. She is to place on the labia minor (internal labia) and then in a ring around the entire anus (including the area where she thought she has a hemorrhoid).  I told her she does not have a hemorrhoid, but has ulceration from the lichen sclerosus.   Please schedule a recheck with me for 4 weeks.   Cc - Patty Gabriela Eves

## 2015-09-17 NOTE — Telephone Encounter (Signed)
Message left to return call to [name] at [phone].    

## 2015-09-17 NOTE — Patient Instructions (Addendum)
You allergies are to Iodine contrast media, Isohexol,and Ether.   Urinary Tract Infection Urinary tract infections (UTIs) can develop anywhere along your urinary tract. Your urinary tract is your body's drainage system for removing wastes and extra water. Your urinary tract includes two kidneys, two ureters, a bladder, and a urethra. Your kidneys are a pair of bean-shaped organs. Each kidney is about the size of your fist. They are located below your ribs, one on each side of your spine. CAUSES Infections are caused by microbes, which are microscopic organisms, including fungi, viruses, and bacteria. These organisms are so small that they can only be seen through a microscope. Bacteria are the microbes that most commonly cause UTIs. SYMPTOMS  Symptoms of UTIs may vary by age and gender of the patient and by the location of the infection. Symptoms in young women typically include a frequent and intense urge to urinate and a painful, burning feeling in the bladder or urethra during urination. Older women and men are more likely to be tired, shaky, and weak and have muscle aches and abdominal pain. A fever may mean the infection is in your kidneys. Other symptoms of a kidney infection include pain in your back or sides below the ribs, nausea, and vomiting. DIAGNOSIS To diagnose a UTI, your caregiver will ask you about your symptoms. Your caregiver will also ask you to provide a urine sample. The urine sample will be tested for bacteria and white blood cells. White blood cells are made by your body to help fight infection. TREATMENT  Typically, UTIs can be treated with medication. Because most UTIs are caused by a bacterial infection, they usually can be treated with the use of antibiotics. The choice of antibiotic and length of treatment depend on your symptoms and the type of bacteria causing your infection. HOME CARE INSTRUCTIONS  If you were prescribed antibiotics, take them exactly as your caregiver  instructs you. Finish the medication even if you feel better after you have only taken some of the medication.  Drink enough water and fluids to keep your urine clear or pale yellow.  Avoid caffeine, tea, and carbonated beverages. They tend to irritate your bladder.  Empty your bladder often. Avoid holding urine for long periods of time.  Empty your bladder before and after sexual intercourse.  After a bowel movement, women should cleanse from front to back. Use each tissue only once. SEEK MEDICAL CARE IF:   You have back pain.  You develop a fever.  Your symptoms do not begin to resolve within 3 days. SEEK IMMEDIATE MEDICAL CARE IF:   You have severe back pain or lower abdominal pain.  You develop chills.  You have nausea or vomiting.  You have continued burning or discomfort with urination. MAKE SURE YOU:   Understand these instructions.  Will watch your condition.  Will get help right away if you are not doing well or get worse.   This information is not intended to replace advice given to you by your health care provider. Make sure you discuss any questions you have with your health care provider.   Document Released: 05/10/2005 Document Revised: 04/21/2015 Document Reviewed: 09/08/2011 Elsevier Interactive Patient Education 2016 Elsevier Inc.       Ciprofloxacin tablets What is this medicine? CIPROFLOXACIN (sip roe FLOX a sin) is a quinolone antibiotic. It is used to treat certain kinds of bacterial infections. It will not work for colds, flu, or other viral infections. This medicine may be used for other  purposes; ask your health care provider or pharmacist if you have questions. What should I tell my health care provider before I take this medicine? They need to know if you have any of these conditions: -bone problems -cerebral disease -history of low levels of potassium in the blood -joint problems -irregular heartbeat -kidney disease -myasthenia  gravis -seizures -tendon problems -tingling of the fingers or toes, or other nerve disorder -an unusual or allergic reaction to ciprofloxacin, other antibiotics or medicines, foods, dyes, or preservatives -pregnant or trying to get pregnant -breast-feeding How should I use this medicine? Take this medicine by mouth with a glass of water. Follow the directions on the prescription label. Take your medicine at regular intervals. Do not take your medicine more often than directed. Take all of your medicine as directed even if you think your are better. Do not skip doses or stop your medicine early. You can take this medicine with food or on an empty stomach. It can be taken with a meal that contains dairy or calcium, but do not take it alone with a dairy product, like milk or yogurt or calcium-fortified juice. A special MedGuide will be given to you by the pharmacist with each prescription and refill. Be sure to read this information carefully each time. Talk to your pediatrician regarding the use of this medicine in children. Special care may be needed. Overdosage: If you think you have taken too much of this medicine contact a poison control center or emergency room at once. NOTE: This medicine is only for you. Do not share this medicine with others. What if I miss a dose? If you miss a dose, take it as soon as you can. If it is almost time for your next dose, take only that dose. Do not take double or extra doses. What may interact with this medicine? Do not take this medicine with any of the following medications: -cisapride -droperidol -terfenadine -tizanidine This medicine may also interact with the following medications: -antacids -birth control pills -caffeine -cyclosporin -didanosine (ddI) buffered tablets or powder -medicines for diabetes -medicines for inflammation like ibuprofen,  naproxen -methotrexate -multivitamins -omeprazole -phenytoin -probenecid -sucralfate -theophylline -warfarin This list may not describe all possible interactions. Give your health care provider a list of all the medicines, herbs, non-prescription drugs, or dietary supplements you use. Also tell them if you smoke, drink alcohol, or use illegal drugs. Some items may interact with your medicine. What should I watch for while using this medicine? Tell your doctor or health care professional if your symptoms do not improve. Do not treat diarrhea with over the counter products. Contact your doctor if you have diarrhea that lasts more than 2 days or if it is severe and watery. You may get drowsy or dizzy. Do not drive, use machinery, or do anything that needs mental alertness until you know how this medicine affects you. Do not stand or sit up quickly, especially if you are an older patient. This reduces the risk of dizzy or fainting spells. This medicine can make you more sensitive to the sun. Keep out of the sun. If you cannot avoid being in the sun, wear protective clothing and use sunscreen. Do not use sun lamps or tanning beds/booths. Avoid antacids, aluminum, calcium, iron, magnesium, and zinc products for 6 hours before and 2 hours after taking a dose of this medicine. What side effects may I notice from receiving this medicine? Side effects that you should report to your doctor or health care professional  as soon as possible: -allergic reactions like skin rash or hives, swelling of the face, lips, or tongue -anxious -confusion -depressed mood -diarrhea -fast, irregular heartbeat -hallucination, loss of contact with reality -joint, muscle, or tendon pain or swelling -pain, tingling, numbness in the hands or feet -suicidal thoughts or other mood changes -sunburn -unusually weak or tired Side effects that usually do not require medical attention (report to your doctor or health care  professional if they continue or are bothersome): -dry mouth -headache -nausea -trouble sleeping This list may not describe all possible side effects. Call your doctor for medical advice about side effects. You may report side effects to FDA at 1-800-FDA-1088. Where should I keep my medicine? Keep out of the reach of children. Store at room temperature below 30 degrees C (86 degrees F). Keep container tightly closed. Throw away any unused medicine after the expiration date. NOTE: This sheet is a summary. It may not cover all possible information. If you have questions about this medicine, talk to your doctor, pharmacist, or health care provider.    2016, Elsevier/Gold Standard. (2015-03-11 12:57:02) sohexol, and Ether according to your electronic medical record.

## 2015-09-17 NOTE — Telephone Encounter (Signed)
Spoke with patient. Advised of message as seen below form Dr.Silva. Appointment scheduled for today at 11 am with Dr.Silva. She is agreeable to date and time.  Routing to provider for final review. Patient agreeable to disposition. Will close encounter.

## 2015-09-17 NOTE — Progress Notes (Signed)
Patient ID: [name], female   DOB: Nov 26, 1945, 70 y.o.   MRN: [id number] GYNECOLOGY  VISIT   HPI: 70 y.o.   Widowed  Caucasian  female   [phone] with Patient's last menstrual period was 08/14/1985.   here for dysuria and hematuria. Symptoms started last night.  Taking Urogesic.  Some back pain across lower back bilaterally.  No fever, nausea, or vomiting.   Taking Urogesic from urology.  [facility] for UTIs.  Has had 3 - 4 times per year.  No hx of renal stones.   No diarrhea.   Not sexual activity.   Using Clobetasol ointment for new diagnosis of lichen sclerosus.   Urine Dip:Mod.RBC's, 2+WBC's, Tr.Glucose    GYNECOLOGIC HISTORY: Patient's last menstrual period was 08/14/1985. Contraception:Postmenopausal Menopausal hormone therapy: None Last mammogram: 04-09-15 3D/Density Cat.B/Neg/BiRads1:[facility] Last pap smear: 08-15-12 Neg:Neg HR HPV        OB History    Gravida Para Term Preterm AB TAB SAB Ectopic Multiple Living   6 1 1  0 5 0 5 0 0 1         Patient Active Problem List   Diagnosis Date Noted  . Status post foot surgery 06/27/2013  . Other hammer toe (acquired) 06/10/2013  . Pain in foot 06/10/2013  . Onychomycosis 06/10/2013    Past Medical History  Diagnosis Date  . Hypertension   . Hyperlipidemia   . Glaucoma   . Post-menopausal    . Osteoporosis   . Osteopenia   . COPD (chronic obstructive pulmonary disease) ([place])   . Non-melanoma skin cancer 08/2015    right leg  . History of recurrent UTIs   . Lichen sclerosus January 2017    vulva/perianal region    Past Surgical History  Procedure Laterality Date  . Foot surgery Right 06/2013    right 2nd toe  . Hematoma evacuation Left 2009    left calf    Current Outpatient Prescriptions  Medication Sig Dispense Refill  . albuterol (PROAIR HFA) 108 (90 BASE) MCG/ACT inhaler Inhale 2 puffs into the lungs every 6 (six) hours as needed for wheezing or shortness of breath.     Marland Kitchen aspirin 81 MG tablet Take 81 mg by mouth daily.    Marland Kitchen atorvastatin (LIPITOR) 20 MG tablet Take 1 tablet by mouth at bedtime.  11  . budesonide-formoterol (SYMBICORT) 160-4.5 MCG/ACT inhaler Inhale 2 puffs into the lungs every morning.    . calcium carbonate (OS-CAL) 600 MG TABS tablet Take 600 mg by mouth 2 (two) times daily with a meal.    . Cholecalciferol (VITAMIN D-3) 1000 UNITS CAPS Take by mouth daily.    . clobetasol ointment (TEMOVATE) [id number] % Apply 1 application topically 2 (two) times daily. 60 g 1  . Coenzyme Q10 (COQ-10 PO) Take by mouth.    . Glucosamine HCl (GLUCOSAMINE PO) Take by mouth.    . irbesartan (AVAPRO) 150 MG tablet Take 150 mg by mouth daily.    Marland Kitchen latanoprost (XALATAN) 0.005 % ophthalmic solution Place 1 drop into both eyes at bedtime.    . Methen-Hyosc-Meth Blue-Na Phos (UROGESIC-BLUE) 81.6 MG TABS Take 1 tablet by mouth as needed.    . Multiple Vitamins-Minerals (CENTRUM SILVER ADULT 50+ PO) Take by mouth daily.    . Multiple Vitamins-Minerals (ICAPS MV PO) Take 1 tablet by mouth daily.    . Omega-3 Fatty Acids (FISH OIL) 1000 MG CAPS Take 1 capsule by mouth daily.    . Psyllium (  METAMUCIL PO) Take by mouth.    . ranitidine (ZANTAC) 150 MG tablet Take 150 mg by mouth as needed for heartburn.    . vitamin B-12 (CYANOCOBALAMIN) 250 MCG tablet Take 250 mcg by mouth daily.    . vitamin C (ASCORBIC ACID) 500 MG tablet Take 500 mg by mouth 2 (two) times daily.    . vitamin E 400 UNIT capsule Take 400 Units by mouth daily.    . ciprofloxacin (CIPRO) 500 MG tablet Take 1 tablet (500 mg total) by mouth 2 (two) times daily. 14 tablet 0   No current facility-administered medications for this visit.     ALLERGIES: Contrast media; Ether; and Iohexol  Family History  Problem Relation Age of Onset  . Breast cancer Mother     deceased -68's  . Heart attack Father   . Heart attack Brother     Social History   Social History  . Marital Status: Widowed    Spouse  Name: N/A  . Number of Children: N/A  . Years of Education: N/A   Occupational History  . Not on file.   Social History Main Topics  . Smoking status: Never Smoker   . Smokeless tobacco: Never Used  . Alcohol Use: Not on file  . Drug Use: No  . Sexual Activity: Yes    Birth Control/ Protection: Post-menopausal   Other Topics Concern  . Not on file   Social History Narrative    ROS:  Pertinent items are noted in HPI.  PHYSICAL EXAMINATION:    BP 110/82 mmHg  Pulse 60  Ht 5\' 5"  (1.651 m)  Wt 200 lb 6.4 oz (90.901 kg)  BMI 33.35 kg/m2  LMP 08/14/1985    General appearance: alert, cooperative and appears stated age Lungs: clear to auscultation bilaterally Heart: regular rate and rhythm Abdomen: soft, non-tender;  no masses,  no organomegaly Back:  No CVA tenderness.  Pelvic: External genitalia:  Areas of healing vulvar biopsies.                Urethra:  normal appearing urethra with no masses, tenderness or lesions              Bartholins and Skenes: normal                 Vagina: normal appearing vagina with normal color and discharge, no lesions              Cervix: no lesions              Bimanual Exam:  Uterus:  normal size, contour, position, consistency, mobility, non-tender              Adnexa: normal adnexa and no mass, fullness, tenderness               Chaperone was present for exam.  ASSESSMENT  Hemorrhagic cystitis. Hx UTIs.  Lichen sclerosus of vulva and perianal region.   PLAN  Counseled regarding UTI.  Will treat with Ciprofloxacin 500 mg po bid for 7 days.  Urine micro and culture sent.  Hydrate well.  To Urgent Care this weekend for fever, worsening of symptoms, nausea/vomiting, malaise. Continue Clobetasol ointment 0.05% bid.  Recheck lichen sclerosus - appointment in one month.   An After Visit Summary was printed and given to the patient.  __15____ minutes face to face time of which over 50% was spent in counseling.   After visit  summary to patient.

## 2015-09-20 LAB — URINE CULTURE

## 2015-09-22 ENCOUNTER — Telehealth: Payer: Self-pay | Admitting: Emergency Medicine

## 2015-09-22 NOTE — Telephone Encounter (Signed)
-----   Message from Nunzio Cobbs, MD sent at 09/21/2015  7:27 PM EST ----- Please contact patient regarding UC showing Proteus bacterial infection.  The Ciprofloxacin will treat the infection.  If she continues to have UTIs, she may benefit from using a small amount of vaginal estrogen cream around the urethra twice a week at bedtime.  We can discuss this further when she returns for her recheck appointment for the lichen sclerosus.  Cc- Marisa Sprinkles

## 2015-09-22 NOTE — Telephone Encounter (Signed)
Message left to return call to [name] at [phone].    

## 2015-09-23 NOTE — Telephone Encounter (Signed)
Thank you for the update.  I have closed the encounter.  

## 2015-09-23 NOTE — Telephone Encounter (Signed)
Patient returned call and she is given message from Dr. Quincy Simmonds.  Patient states she has one day of Cipro left. She feels much improved but does feel some back pain intermittently. She did recently do a lot of housecleaning for a friend.  She is advised to call back if pain persists or any concerning symptoms after completion of Cipro.  Patient states that she feels she may have one suture that still remains in vulva. Patient states she is unable to visualize the area.  She declines office visit for assessment. She states she will call back on Monday if she can still feel the suture or before if she notices any redness, swelling, discharge or fevers or chills.  Advised will review with Dr. Quincy Simmonds and return call if any additional instructions. Patient agreeable.

## 2015-10-05 ENCOUNTER — Telehealth: Payer: Self-pay | Admitting: Obstetrics and Gynecology

## 2015-10-05 NOTE — Telephone Encounter (Signed)
Patient left message on our voicemail in regards to wanting to talk to nurse about stitches. Best # to reach: [phone]

## 2015-10-05 NOTE — Telephone Encounter (Signed)
Spoke with patient. Patient had a vulvar biopsy on 09/10/2015 with Dr.Silva. Single suture of 3/0 vicryl were placed to each biopsy site. The patient states she still has a suture in place that is "bothering" her. States she is unable to see the biopsy site to assess for redness, oozing, or swelling. Denies any fevers or chills. Advised she will need to be seen in the office for evaluation and removal of the suture. Appointment scheduled for tomorrow 10/06/2015 at 9:30 am with Dr.Silva. She is agreeable to date and time.  Routing to provider for final review. Patient agreeable to disposition. Will close encounter.

## 2015-10-06 ENCOUNTER — Encounter: Payer: Self-pay | Admitting: Obstetrics and Gynecology

## 2015-10-06 ENCOUNTER — Ambulatory Visit (INDEPENDENT_AMBULATORY_CARE_PROVIDER_SITE_OTHER): Payer: Medicare Other | Admitting: Obstetrics and Gynecology

## 2015-10-06 VITALS — BP 128/76 | HR 60 | Resp 20 | Ht 65.0 in | Wt 202.8 lb

## 2015-10-06 DIAGNOSIS — R300 Dysuria: Secondary | ICD-10-CM | POA: Diagnosis not present

## 2015-10-06 DIAGNOSIS — N760 Acute vaginitis: Secondary | ICD-10-CM | POA: Diagnosis not present

## 2015-10-06 DIAGNOSIS — N390 Urinary tract infection, site not specified: Secondary | ICD-10-CM | POA: Diagnosis not present

## 2015-10-06 LAB — POCT URINALYSIS DIPSTICK
BILIRUBIN UA: NEGATIVE
Blood, UA: NEGATIVE
GLUCOSE UA: NEGATIVE
Ketones, UA: NEGATIVE
NITRITE UA: NEGATIVE
Protein, UA: NEGATIVE
Urobilinogen, UA: NEGATIVE
pH, UA: 5

## 2015-10-06 MED ORDER — FLUCONAZOLE 150 MG PO TABS: 150.0000 mg | ORAL_TABLET | Freq: Once | ORAL | Status: DC

## 2015-10-06 NOTE — Progress Notes (Signed)
Patient ID: OFA ISSA, female   DOB: 15-Jan-1946, 70 y.o.   MRN: [id number] GYNECOLOGY  VISIT   HPI: 70 y.o.   Widowed  Caucasian  female   [phone] with Patient's last menstrual period was 08/14/1985.   here for pain at vulvar incision site. Patient also states she completed Cipro for UTI but still having some burning with urination--unsure if this is true dysuria or if this burning is actual "vaginal" burning.  Status post vulvar biopsy on 99991111 - dx lichen sclerosus.  Thinks the biopsy site is painful.  Feels the clobetasol may or may not be helping.   Not using pads on her bottom.  Using loose clothing.   Feels her bottom is sore for one year.   Has tried new detergents.   Proteus UTI on 09/17/15 tx with Ciprofloxacin.  Can have some dysuria off and on.   Went to urology yesterday to ask for Urogesic Rx.  Asking for samples here.  GYNECOLOGIC HISTORY: Patient's last menstrual period was 08/14/1985. Contraception:Postmenopausal Menopausal hormone therapy: None Last mammogram: 04-09-15 3D/Density Cat.B/Neg/BiRads1/[facility] Last pap smear: 08-15-12 Neg:Neg HR HPV        OB History    Gravida Para Term Preterm AB TAB SAB Ectopic Multiple Living   6 1 1  0 5 0 5 0 0 1         Patient Active Problem List   Diagnosis Date Noted  . Status post foot surgery 06/27/2013  . Other hammer toe (acquired) 06/10/2013  . Pain in foot 06/10/2013  . Onychomycosis 06/10/2013    Past Medical History  Diagnosis Date  . Hypertension   . Hyperlipidemia   . Glaucoma   . Post-menopausal    . Osteoporosis   . Osteopenia   . COPD (chronic obstructive pulmonary disease) ([place])   . Non-melanoma skin cancer 08/2015    right leg  . History of recurrent UTIs   . Lichen sclerosus January 2017    vulva/perianal region    Past Surgical History  Procedure Laterality Date  . Foot surgery Right 06/2013    right 2nd toe  . Hematoma evacuation Left 2009    left calf     Current Outpatient Prescriptions  Medication Sig Dispense Refill  . albuterol (PROAIR HFA) 108 (90 BASE) MCG/ACT inhaler Inhale 2 puffs into the lungs every 6 (six) hours as needed for wheezing or shortness of breath.    Marland Kitchen aspirin 81 MG tablet Take 81 mg by mouth daily.    Marland Kitchen atorvastatin (LIPITOR) 20 MG tablet Take 1 tablet by mouth at bedtime.  11  . budesonide-formoterol (SYMBICORT) 160-4.5 MCG/ACT inhaler Inhale 2 puffs into the lungs every morning.    . calcium carbonate (OS-CAL) 600 MG TABS tablet Take 600 mg by mouth 2 (two) times daily with a meal.    . Cholecalciferol (VITAMIN D-3) 1000 UNITS CAPS Take by mouth daily.    . ciprofloxacin (CIPRO) 500 MG tablet Take 1 tablet (500 mg total) by mouth 2 (two) times daily. 14 tablet 0  . clobetasol ointment (TEMOVATE) AB-123456789 % Apply 1 application topically 2 (two) times daily. 60 g 1  . Coenzyme Q10 (COQ-10 PO) Take by mouth.    . Glucosamine HCl (GLUCOSAMINE PO) Take by mouth.    . irbesartan (AVAPRO) 150 MG tablet Take 150 mg by mouth daily.    Marland Kitchen latanoprost (XALATAN) 0.005 % ophthalmic solution Place 1 drop into both eyes at bedtime.    Genene Churn  Phos (UROGESIC-BLUE) 81.6 MG TABS Take 1 tablet by mouth as needed.    . Multiple Vitamins-Minerals (CENTRUM SILVER ADULT 50+ PO) Take by mouth daily.    . Multiple Vitamins-Minerals (ICAPS MV PO) Take 1 tablet by mouth daily.    . Omega-3 Fatty Acids (FISH OIL) 1000 MG CAPS Take 1 capsule by mouth daily.    . Psyllium (METAMUCIL PO) Take by mouth.    . ranitidine (ZANTAC) 150 MG tablet Take 150 mg by mouth as needed for heartburn.    . vitamin B-12 (CYANOCOBALAMIN) 250 MCG tablet Take 250 mcg by mouth daily.    . vitamin C (ASCORBIC ACID) 500 MG tablet Take 500 mg by mouth 2 (two) times daily.    . vitamin E 400 UNIT capsule Take 400 Units by mouth daily.     No current facility-administered medications for this visit.     ALLERGIES: Contrast media; Ether; and  Iohexol  Family History  Problem Relation Age of Onset  . Breast cancer Mother     deceased -41's  . Heart attack Father   . Heart attack Brother     Social History   Social History  . Marital Status: Widowed    Spouse Name: N/A  . Number of Children: N/A  . Years of Education: N/A   Occupational History  . Not on file.   Social History Main Topics  . Smoking status: Never Smoker   . Smokeless tobacco: Never Used  . Alcohol Use: Not on file  . Drug Use: No  . Sexual Activity: Yes    Birth Control/ Protection: Post-menopausal   Other Topics Concern  . Not on file   Social History Narrative    ROS:  Pertinent items are noted in HPI.  PHYSICAL EXAMINATION:    BP 128/76 mmHg  Pulse 60  Resp 20  Ht 5\' 5"  (1.651 m)  Wt 202 lb 12.8 oz (91.989 kg)  BMI 33.75 kg/m2  LMP 08/14/1985    General appearance: alert, cooperative and appears stated age   Pelvic: External genitalia: excoriation and mucousy drainage around vicryl sutures - removed.  Erythema of bilateral labia minora, clitoral region, and outline down near anus of whitish change.               Urethra:  normal appearing urethra with no masses, tenderness or lesions              Bartholins and Skenes: normal                 Vagina: Erythema of the vagina.  No discharge noted.              Cervix: no lesions          Bimanual Exam:  Uterus:  normal size, contour, position, consistency, mobility, non-tender              Adnexa: normal adnexa and no mass, fullness, tenderness               Chaperone was present for exam.  ASSESSMENT Lichen sclerosus. Appears improved.  Irritation from sutures. Recent UTI.  Periodic dysuria.  I suspect yeast today following Cipro for UTI.  PLAN  Counseled regarding lichen sclerosus.   Reduce clobetasol to q hs.  Affirm done.  Diflucan 150 mg po x 1.  #2.  RF none. Urine micro and culture sent.  I do not recommend chronic use of Urabelle.  I recommend office visits if  she is  having dysuria.  Follow up in one month.   An After Visit Summary was printed and given to the patient.  ___15___ minutes face to face time of which over 50% was spent in counseling.

## 2015-10-07 LAB — WET PREP BY MOLECULAR PROBE
CANDIDA SPECIES: NEGATIVE
Gardnerella vaginalis: NEGATIVE
Trichomonas vaginosis: NEGATIVE

## 2015-10-08 ENCOUNTER — Telehealth: Payer: Self-pay | Admitting: Emergency Medicine

## 2015-10-08 LAB — URINE CULTURE
Colony Count: NO GROWTH
Organism ID, Bacteria: NO GROWTH

## 2015-10-08 NOTE — Telephone Encounter (Signed)
-----   Message from Nunzio Cobbs, MD sent at 10/08/2015  8:29 AM EST ----- Please inform patient of her negative urine culture. Her Affirm testing was negative for infection.  This was sent through in a separate message.  Cc- Marisa Sprinkles

## 2015-10-08 NOTE — Telephone Encounter (Signed)
Patient returned your call during lunch left message on our voicemail. [phone]

## 2015-10-08 NOTE — Telephone Encounter (Signed)
Message left to return call to Braysen Cloward at [phone].    

## 2015-10-08 NOTE — Telephone Encounter (Signed)
-----   Message from Nunzio Cobbs, MD sent at 10/07/2015  8:47 PM EST ----- Please inform patient of her negative Affirm testing.  I did give her empiric Diflucan to treat the discomfort prior to the results of the Affirm. I hope she is better since I removed the sutures which were irritated.  I want to see her back in one month to see how her progress is.  If she is not feeling better, we do have some other options to treat a painful vulva/perianal region - estrogen cream, lidocaine jelly, zinc oxide cream.

## 2015-10-08 NOTE — Telephone Encounter (Signed)
Call to patient and she is given messages from Dr. Quincy Simmonds.  Denies complaints at this time.  She will follow up as scheduled for 11/03/15 or earlier if symptoms remain.  Routing to provider for final review. Patient agreeable to disposition. Will close encounter.

## 2015-10-12 ENCOUNTER — Telehealth: Payer: Self-pay | Admitting: Obstetrics and Gynecology

## 2015-10-12 ENCOUNTER — Encounter: Payer: Self-pay | Admitting: Obstetrics and Gynecology

## 2015-10-12 ENCOUNTER — Ambulatory Visit (INDEPENDENT_AMBULATORY_CARE_PROVIDER_SITE_OTHER): Payer: Medicare Other | Admitting: Obstetrics and Gynecology

## 2015-10-12 VITALS — BP 122/80 | HR 84 | Resp 16 | Wt 200.0 lb

## 2015-10-12 DIAGNOSIS — B372 Candidiasis of skin and nail: Secondary | ICD-10-CM

## 2015-10-12 DIAGNOSIS — L900 Lichen sclerosus et atrophicus: Secondary | ICD-10-CM | POA: Diagnosis not present

## 2015-10-12 MED ORDER — BETAMETHASONE VALERATE 0.1 % EX OINT: 1.0000 "application " | TOPICAL_OINTMENT | Freq: Two times a day (BID) | CUTANEOUS | Status: DC

## 2015-10-12 MED ORDER — LIDOCAINE 5 % EX OINT: 1.0000 "application " | TOPICAL_OINTMENT | Freq: Three times a day (TID) | CUTANEOUS | Status: DC

## 2015-10-12 MED ORDER — OXYCODONE-ACETAMINOPHEN 5-325 MG PO TABS: 1.0000 | ORAL_TABLET | ORAL | Status: DC | PRN

## 2015-10-12 MED ORDER — NYSTATIN 100000 UNIT/GM EX CREA: 1.0000 "application " | TOPICAL_CREAM | Freq: Two times a day (BID) | CUTANEOUS | Status: DC

## 2015-10-12 NOTE — Progress Notes (Signed)
Patient ID: [name], female   DOB: Apr 03, 1946, 71 y.o.   MRN: [id number] GYNECOLOGY  VISIT   HPI: 70 y.o.   Widowed  Caucasian  female   [phone] with Patient's last menstrual period was 08/14/1985.   here to folllow up on lichen sclerosus. She was recently diagnosed with lichen sclerosis and started on steroid ointment. Last week she was seen and had a negative urine culture and negative affirm test. Prior to yesterday she was feeling slightly better, yesterday she starting hurting terribly, she can't sit down, can't sleep well. She is using the steroid ointment q day. No bleeding, no vaginal d/c. No itching. She feels raw around her rectum.   GYNECOLOGIC HISTORY: Patient's last menstrual period was 08/14/1985. Contraception:post menopause  Menopausal hormone therapy: none         OB History    Gravida Para Term Preterm AB TAB SAB Ectopic Multiple Living   6 1 1  0 5 0 5 0 0 1         Patient Active Problem List   Diagnosis Date Noted  . Status post foot surgery 06/27/2013  . Other hammer toe (acquired) 06/10/2013  . Pain in foot 06/10/2013  . Onychomycosis 06/10/2013    Past Medical History  Diagnosis Date  . Hypertension   . Hyperlipidemia   . Glaucoma   . Post-menopausal    . Osteoporosis   . Osteopenia   . COPD (chronic obstructive pulmonary disease) ([place])   . Non-melanoma skin cancer 08/2015    right leg  . History of recurrent UTIs   . Lichen sclerosus January 2017    vulva/perianal region    Past Surgical History  Procedure Laterality Date  . Foot surgery Right 06/2013    right 2nd toe  . Hematoma evacuation Left 2009    left calf    Current Outpatient Prescriptions  Medication Sig Dispense Refill  . albuterol (PROAIR HFA) 108 (90 BASE) MCG/ACT inhaler Inhale 2 puffs into the lungs every 6 (six) hours as needed for wheezing or shortness of breath.    Marland Kitchen aspirin 81 MG tablet Take 81 mg by mouth daily.    Marland Kitchen atorvastatin (LIPITOR) 20 MG tablet Take 1  tablet by mouth at bedtime.  11  . budesonide-formoterol (SYMBICORT) 160-4.5 MCG/ACT inhaler Inhale 2 puffs into the lungs every morning.    . calcium carbonate (OS-CAL) 600 MG TABS tablet Take 600 mg by mouth 2 (two) times daily with a meal.    . Cholecalciferol (VITAMIN D-3) 1000 UNITS CAPS Take by mouth daily.    . clobetasol ointment (TEMOVATE) [id number] % Apply 1 application topically 2 (two) times daily. 60 g 1  . Coenzyme Q10 (COQ-10 PO) Take by mouth.    . Glucosamine HCl (GLUCOSAMINE PO) Take by mouth.    . irbesartan (AVAPRO) 150 MG tablet Take 150 mg by mouth daily.    Marland Kitchen latanoprost (XALATAN) 0.005 % ophthalmic solution Place 1 drop into both eyes at bedtime.    . Methen-Hyosc-Meth Blue-Na Phos (UROGESIC-BLUE) 81.6 MG TABS Take 1 tablet by mouth as needed.    . Multiple Vitamins-Minerals (CENTRUM SILVER ADULT 50+ PO) Take by mouth daily.    . Multiple Vitamins-Minerals (ICAPS MV PO) Take 1 tablet by mouth daily.    . Omega-3 Fatty Acids (FISH OIL) 1000 MG CAPS Take 1 capsule by mouth daily.    . Psyllium (METAMUCIL PO) Take by mouth.    . ranitidine (ZANTAC) 150 MG tablet Take 150  mg by mouth as needed for heartburn.    . vitamin B-12 (CYANOCOBALAMIN) 250 MCG tablet Take 250 mcg by mouth daily.    . vitamin C (ASCORBIC ACID) 500 MG tablet Take 500 mg by mouth 2 (two) times daily.    . vitamin E 400 UNIT capsule Take 400 Units by mouth daily.     No current facility-administered medications for this visit.     ALLERGIES: Contrast media; Ether; and Iohexol  Family History  Problem Relation Age of Onset  . Breast cancer Mother     deceased -29's  . Heart attack Father   . Heart attack Brother     Social History   Social History  . Marital Status: Widowed    Spouse Name: N/A  . Number of Children: N/A  . Years of Education: N/A   Occupational History  . Not on file.   Social History Main Topics  . Smoking status: Never Smoker   . Smokeless tobacco: Never Used  .  Alcohol Use: Not on file  . Drug Use: No  . Sexual Activity: Yes    Birth Control/ Protection: Post-menopausal   Other Topics Concern  . Not on file   Social History Narrative    Review of Systems  Constitutional: Negative.   HENT: Negative.   Eyes: Negative.   Respiratory: Negative.   Cardiovascular: Negative.   Gastrointestinal: Negative.   Genitourinary:       Lichen sclerosus- painful   Musculoskeletal: Positive for back pain.       Knee pain  Skin: Negative.   Neurological: Negative.   Endo/Heme/Allergies: Negative.   Psychiatric/Behavioral: Negative.     PHYSICAL EXAMINATION:    BP 122/80 mmHg  Pulse 84  Resp 16  Wt 200 lb (90.719 kg)  LMP 08/14/1985    General appearance: alert, cooperative and appears stated age. She is tearful and is unable to sit, appears to be in significant discomfort.   Pelvic: External genitalia:  no lesions              Urethra:  normal appearing urethra with no masses, tenderness or lesions              Bartholins and Skenes: normal                 Vagina: normal appearing vagina with normal color and discharge, no lesions. Atrophic              Cervix: no lesions                          Anus: no lesions  Intergluteal folds are very erythematous, excoriated appearing with an irregular boarder. The rash looks c/w candida intertrigo. The erythematous rash is lateral to and surrounding an inner  area of tissue whitening. It is lateral to where the prior biopsies were taken.   Chaperone was present for exam.  ASSESSMENT Severe rash in the intergluteal fold, looks c/w candida intertrigo The patient is in marked discomfort, unable to sit. Tearful.    PLAN Stop clobetasol Yeast culture of the skin sent Treat with nystatin ointment BID Can use valisone ointment BID (low potency) Percocet for pain (she can't sit, trouble sleeping) Lidocaine ointment to use sparingly F/U in 48 hours if not improving, otherwise f/u early next  week Letter given to get her out of jury duty for the next week    An After Visit Summary was printed and  given to the patient.

## 2015-10-13 ENCOUNTER — Telehealth: Payer: Self-pay | Admitting: *Deleted

## 2015-10-13 NOTE — Telephone Encounter (Signed)
I tried to contact patient to check in with her and see how she is feeling per Dr. Talbert Nan request. I left a message on both her home and cell phone.

## 2015-10-14 NOTE — Telephone Encounter (Signed)
LMTC -eh  

## 2015-10-14 NOTE — Telephone Encounter (Signed)
Forwarding  message to Dr. Talbert Nan

## 2015-10-14 NOTE — Telephone Encounter (Signed)
Please make sure the patient isn't overusing the lidocaine ointment. That should be applied sparingly up to 3 x a day as needed. Thanks!!

## 2015-10-14 NOTE — Telephone Encounter (Signed)
Patient says the medication that Dr.Jertson gave her worked so well. Patient says she is feeling much better, she is still red and achy but is using the cream every time she can. Patient says she will give [place] a call if she needs anything.

## 2015-10-19 ENCOUNTER — Ambulatory Visit (INDEPENDENT_AMBULATORY_CARE_PROVIDER_SITE_OTHER): Payer: Medicare Other | Admitting: Obstetrics and Gynecology

## 2015-10-19 VITALS — BP 92/60 | HR 62 | Resp 16 | Ht 65.0 in | Wt 201.0 lb

## 2015-10-19 DIAGNOSIS — L900 Lichen sclerosus et atrophicus: Secondary | ICD-10-CM

## 2015-10-19 DIAGNOSIS — B372 Candidiasis of skin and nail: Secondary | ICD-10-CM | POA: Diagnosis not present

## 2015-10-19 MED ORDER — BETAMETHASONE VALERATE 0.1 % EX OINT: TOPICAL_OINTMENT | CUTANEOUS | Status: DC

## 2015-10-19 NOTE — Patient Instructions (Signed)
Stop the lidocaine ointment and percocet Use the nystatin 2 x a day x 3 days Use the valisone every day x 1 week, then 3 x a week

## 2015-10-19 NOTE — Progress Notes (Signed)
GYNECOLOGY  VISIT   HPI: 70 y.o.   Widowed  Caucasian  female   [phone] with Patient's last menstrual period was 08/14/1985.   here for  Follow up  LSA, severe vulvitis. Last week the patient was treated for a severe case of inter gluteal candida intertrigo. The day after she started treatment she felt better. Now feels fine, no c/o.   GYNECOLOGIC HISTORY: Patient's last menstrual period was 08/14/1985. Contraception: Post Menopausal  Menopausal hormone therapy: None        OB History    Gravida Para Term Preterm AB TAB SAB Ectopic Multiple Living   6 1 1  0 5 0 5 0 0 1         Patient Active Problem List   Diagnosis Date Noted  . Status post foot surgery 06/27/2013  . Other hammer toe (acquired) 06/10/2013  . Pain in foot 06/10/2013  . Onychomycosis 06/10/2013    Past Medical History  Diagnosis Date  . Hypertension   . Hyperlipidemia   . Glaucoma   . Post-menopausal    . Osteoporosis   . Osteopenia   . COPD (chronic obstructive pulmonary disease) ([place])   . Non-melanoma skin cancer 08/2015    right leg  . History of recurrent UTIs   . Lichen sclerosus January 2017    vulva/perianal region    Past Surgical History  Procedure Laterality Date  . Foot surgery Right 06/2013    right 2nd toe  . Hematoma evacuation Left 2009    left calf    Current Outpatient Prescriptions  Medication Sig Dispense Refill  . albuterol (PROAIR HFA) 108 (90 BASE) MCG/ACT inhaler Inhale 2 puffs into the lungs every 6 (six) hours as needed for wheezing or shortness of breath.    Marland Kitchen aspirin 81 MG tablet Take 81 mg by mouth daily.    Marland Kitchen atorvastatin (LIPITOR) 20 MG tablet Take 1 tablet by mouth at bedtime.  11  . betamethasone valerate ointment (VALISONE) 0.1 % Apply a pea sized amount 3 x a week 30 g 1  . budesonide-formoterol (SYMBICORT) 160-4.5 MCG/ACT inhaler Inhale 2 puffs into the lungs every morning.    . calcium carbonate (OS-CAL) 600 MG TABS tablet Take 600 mg by mouth 2 (two) times  daily with a meal.    . Cholecalciferol (VITAMIN D-3) 1000 UNITS CAPS Take by mouth daily.    . Coenzyme Q10 (COQ-10 PO) Take by mouth.    . Glucosamine HCl (GLUCOSAMINE PO) Take by mouth.    . irbesartan (AVAPRO) 150 MG tablet Take 150 mg by mouth daily.    Marland Kitchen latanoprost (XALATAN) 0.005 % ophthalmic solution Place 1 drop into both eyes at bedtime.    . Methen-Hyosc-Meth Blue-Na Phos (UROGESIC-BLUE) 81.6 MG TABS Take 1 tablet by mouth as needed.    . Multiple Vitamins-Minerals (CENTRUM SILVER ADULT 50+ PO) Take by mouth daily.    . Multiple Vitamins-Minerals (ICAPS MV PO) Take 1 tablet by mouth daily.    Marland Kitchen nystatin cream (MYCOSTATIN) Apply 1 application topically 2 (two) times daily. Apply to affected area BID for up to 14 days. 30 g 1  . Omega-3 Fatty Acids (FISH OIL) 1000 MG CAPS Take 1 capsule by mouth daily.    . Psyllium (METAMUCIL PO) Take by mouth.    . ranitidine (ZANTAC) 150 MG tablet Take 150 mg by mouth as needed for heartburn.    . vitamin B-12 (CYANOCOBALAMIN) 250 MCG tablet Take 250 mcg by mouth daily.    Marland Kitchen  vitamin C (ASCORBIC ACID) 500 MG tablet Take 500 mg by mouth 2 (two) times daily.    . vitamin E 400 UNIT capsule Take 400 Units by mouth daily.     No current facility-administered medications for this visit.     ALLERGIES: Contrast media; Ether; and Iohexol  Family History  Problem Relation Age of Onset  . Breast cancer Mother     deceased -37's  . Heart attack Father   . Heart attack Brother     Social History   Social History  . Marital Status: Widowed    Spouse Name: N/A  . Number of Children: N/A  . Years of Education: N/A   Occupational History  . Not on file.   Social History Main Topics  . Smoking status: Never Smoker   . Smokeless tobacco: Never Used  . Alcohol Use: Not on file  . Drug Use: No  . Sexual Activity: Yes    Birth Control/ Protection: Post-menopausal   Other Topics Concern  . Not on file   Social History Narrative    Review  of Systems  Constitutional: Negative.   HENT: Negative.   Eyes: Negative.   Respiratory: Negative.   Cardiovascular: Negative.   Gastrointestinal: Negative.   Genitourinary: Negative.   Musculoskeletal: Negative.   Skin: Negative.   Neurological: Negative.   Endo/Heme/Allergies: Negative.   Psychiatric/Behavioral: Negative.     PHYSICAL EXAMINATION:    BP 92/60 mmHg  Pulse 62  Resp 16  Ht 5\' 5"  (1.651 m)  Wt 201 lb (91.173 kg)  BMI 33.45 kg/m2  LMP 08/14/1985    General appearance: alert, cooperative and appears stated age  Pelvic: External genitalia:  Diffuse whitening of the vulva and perianal area. Slight agglutination of the labia minora to majora. No fissures. The prior erythema has resolved.               Urethra:  normal appearing urethra with no masses, tenderness or lesions              Bartholins and Skenes: normal                  Chaperone was present for exam.  ASSESSMENT Candida intertrigo, resolved Lichen sclerosis, stable, not currently symptomatic  PLAN Continue the nystatin cream for 3 more days Use the valisone qd x 1 week, then 3 x a week F/U in 1 month Call with any concerns    An After Visit Summary was printed and given to the patient.  15 minutes face to face time of which over 50% was spent in counseling.

## 2015-10-20 ENCOUNTER — Ambulatory Visit: Payer: Medicare Other | Admitting: Obstetrics and Gynecology

## 2015-10-20 NOTE — Telephone Encounter (Signed)
Spoke with patient with Dr.Jertson at her 10/19/15 appointment. She is much better and is using the lidocaine correctly -eh

## 2015-11-03 ENCOUNTER — Ambulatory Visit: Payer: Medicare Other | Admitting: Obstetrics and Gynecology

## 2015-11-04 ENCOUNTER — Ambulatory Visit: Payer: Medicare Other | Admitting: Obstetrics and Gynecology

## 2015-11-08 ENCOUNTER — Other Ambulatory Visit: Payer: Self-pay | Admitting: Internal Medicine

## 2015-11-08 DIAGNOSIS — M810 Age-related osteoporosis without current pathological fracture: Secondary | ICD-10-CM

## 2015-11-09 LAB — FUNGUS CULTURE W SMEAR

## 2015-11-18 ENCOUNTER — Encounter: Payer: Self-pay | Admitting: Obstetrics and Gynecology

## 2015-11-18 ENCOUNTER — Ambulatory Visit: Payer: Medicare Other | Admitting: Obstetrics and Gynecology

## 2015-11-18 ENCOUNTER — Ambulatory Visit (INDEPENDENT_AMBULATORY_CARE_PROVIDER_SITE_OTHER): Payer: Medicare Other | Admitting: Obstetrics and Gynecology

## 2015-11-18 VITALS — BP 110/60 | HR 84 | Resp 16 | Wt 205.0 lb

## 2015-11-18 DIAGNOSIS — L900 Lichen sclerosus et atrophicus: Secondary | ICD-10-CM

## 2015-11-18 NOTE — Progress Notes (Signed)
Patient ID: [name], female   DOB: Apr 14, 1946, 70 y.o.   MRN: [id number] GYNECOLOGY  VISIT   HPI: 70 y.o.   Widowed  Caucasian  female   [phone] with Patient's last menstrual period was 08/14/1985.   here for a follow up LSA and candida intertrigo. The candida was treated, using valisone ointment 3 x a week, no symptoms. Feels fine.  She has a female friend, not sexually active.   GYNECOLOGIC HISTORY: Patient's last menstrual period was 08/14/1985. Contraception:postmenopause Menopausal hormone therapy: none         OB History    Gravida Para Term Preterm AB TAB SAB Ectopic Multiple Living   6 1 1  0 5 0 5 0 0 1         Patient Active Problem List   Diagnosis Date Noted  . Status post foot surgery 06/27/2013  . Other hammer toe (acquired) 06/10/2013  . Pain in foot 06/10/2013  . Onychomycosis 06/10/2013    Past Medical History  Diagnosis Date  . Hypertension   . Hyperlipidemia   . Glaucoma   . Post-menopausal    . Osteoporosis   . Osteopenia   . COPD (chronic obstructive pulmonary disease) ([place])   . Non-melanoma skin cancer 08/2015    right leg  . History of recurrent UTIs   . Lichen sclerosus January 2017    vulva/perianal region    Past Surgical History  Procedure Laterality Date  . Foot surgery Right 06/2013    right 2nd toe  . Hematoma evacuation Left 2009    left calf    Current Outpatient Prescriptions  Medication Sig Dispense Refill  . albuterol (PROAIR HFA) 108 (90 BASE) MCG/ACT inhaler Inhale 2 puffs into the lungs every 6 (six) hours as needed for wheezing or shortness of breath.    Marland Kitchen aspirin 81 MG tablet Take 81 mg by mouth daily.    Marland Kitchen atorvastatin (LIPITOR) 20 MG tablet Take 1 tablet by mouth at bedtime.  11  . betamethasone valerate ointment (VALISONE) 0.1 % Apply a pea sized amount 3 x a week 30 g 1  . budesonide-formoterol (SYMBICORT) 160-4.5 MCG/ACT inhaler Inhale 2 puffs into the lungs every morning.    . calcium carbonate (OS-CAL) 600  MG TABS tablet Take 600 mg by mouth 2 (two) times daily with a meal.    . Cholecalciferol (VITAMIN D-3) 1000 UNITS CAPS Take by mouth daily.    . Coenzyme Q10 (COQ-10 PO) Take by mouth.    . irbesartan (AVAPRO) 150 MG tablet Take 150 mg by mouth daily.    Marland Kitchen latanoprost (XALATAN) 0.005 % ophthalmic solution Place 1 drop into both eyes at bedtime.    . Methen-Hyosc-Meth Blue-Na Phos (UROGESIC-BLUE) 81.6 MG TABS Take 1 tablet by mouth as needed.    . Multiple Vitamins-Minerals (CENTRUM SILVER ADULT 50+ PO) Take by mouth daily.    . Multiple Vitamins-Minerals (ICAPS MV PO) Take 1 tablet by mouth daily.    Marland Kitchen nystatin cream (MYCOSTATIN) Apply 1 application topically 2 (two) times daily. Apply to affected area BID for up to 14 days. 30 g 1  . Omega-3 Fatty Acids (FISH OIL) 1000 MG CAPS Take 1 capsule by mouth daily.    . Psyllium (METAMUCIL PO) Take by mouth.    . ranitidine (ZANTAC) 150 MG tablet Take 150 mg by mouth as needed for heartburn.    . vitamin B-12 (CYANOCOBALAMIN) 250 MCG tablet Take 250 mcg by mouth daily.    Marland Kitchen  vitamin C (ASCORBIC ACID) 500 MG tablet Take 500 mg by mouth 2 (two) times daily.    . vitamin E 400 UNIT capsule Take 400 Units by mouth daily.     No current facility-administered medications for this visit.     ALLERGIES: Contrast media; Ether; and Iohexol  Family History  Problem Relation Age of Onset  . Breast cancer Mother     deceased -30's  . Heart attack Father   . Heart attack Brother     Social History   Social History  . Marital Status: Widowed    Spouse Name: N/A  . Number of Children: N/A  . Years of Education: N/A   Occupational History  . Not on file.   Social History Main Topics  . Smoking status: Never Smoker   . Smokeless tobacco: Never Used  . Alcohol Use: Not on file  . Drug Use: No  . Sexual Activity: Yes    Birth Control/ Protection: Post-menopausal   Other Topics Concern  . Not on file   Social History Narrative    Review of  Systems  Constitutional: Negative.   HENT: Negative.   Eyes: Negative.   Respiratory: Negative.   Cardiovascular: Negative.   Gastrointestinal: Negative.   Genitourinary: Negative.   Musculoskeletal: Negative.   Skin: Negative.   Neurological: Negative.   Endo/Heme/Allergies: Negative.   Psychiatric/Behavioral: Negative.     PHYSICAL EXAMINATION:    BP 110/60 mmHg  Pulse 84  Resp 16  Wt 205 lb (92.987 kg)  LMP 08/14/1985    General appearance: alert, cooperative and appears stated age  Pelvic: External genitalia:  Mild whitening on the labia minora, L>R, mild agglutination. No lesions, plaques or fissures.               Urethra:  normal appearing urethra with no masses, tenderness or lesions              Bartholins and Skenes: normal      Peri-anal skin has healed               Chaperone was present for exam.  ASSESSMENT Candida intertrigo, completely cleared LSA, stable, not currently symptomatic    PLAN Decrease the valisone to a pea sized amount q week Call with any concerns Can use Vaseline as needed   An After Visit Summary was printed and given to the patient.

## 2015-11-26 ENCOUNTER — Ambulatory Visit
Admission: RE | Admit: 2015-11-26 | Discharge: 2015-11-26 | Disposition: A | Discharge status: Home / Self Care | Payer: Medicare Other | Source: Ambulatory Visit | Attending: Internal Medicine | Admitting: Internal Medicine

## 2015-11-26 DIAGNOSIS — M810 Age-related osteoporosis without current pathological fracture: Secondary | ICD-10-CM

## 2016-03-08 ENCOUNTER — Ambulatory Visit
Admission: RE | Admit: 2016-03-08 | Discharge: 2016-03-08 | Disposition: A | Discharge status: Home / Self Care | Payer: Medicare Other | Source: Ambulatory Visit | Attending: Internal Medicine | Admitting: Internal Medicine

## 2016-03-08 ENCOUNTER — Other Ambulatory Visit: Payer: Self-pay | Admitting: Internal Medicine

## 2016-03-08 DIAGNOSIS — M25561 Pain in right knee: Secondary | ICD-10-CM

## 2016-03-08 IMAGING — CR DG KNEE COMPLETE 4+V*R*
4 series · 4 of 4 positions shown · non-contrast
Comparison: None.

CLINICAL DATA: Fall this morning.  Knee pain.

EXAM:
RIGHT KNEE - COMPLETE 4+ VIEW

[w knee ap right]
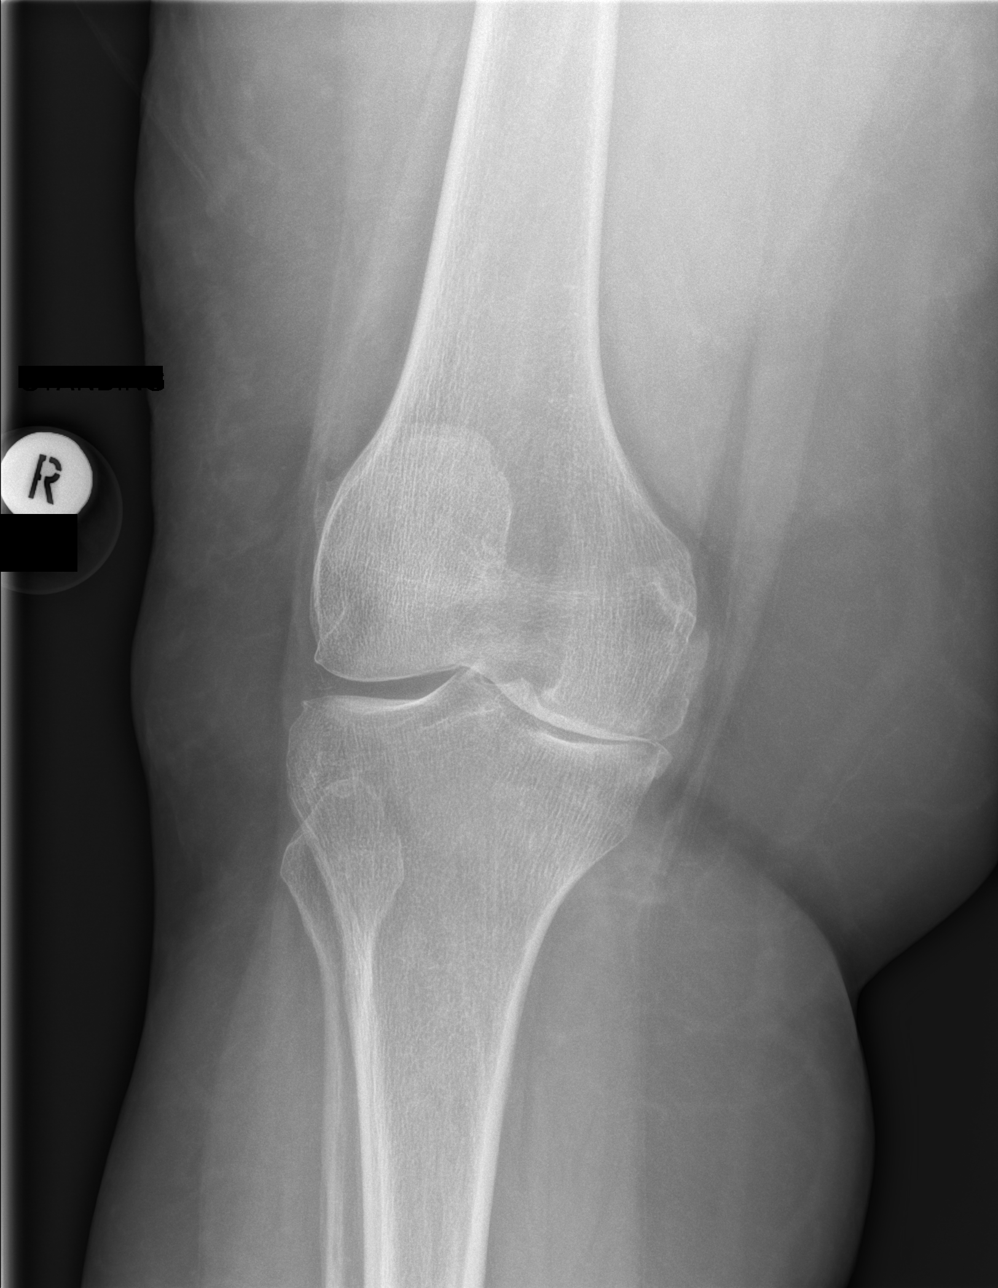

[w knee lat right]
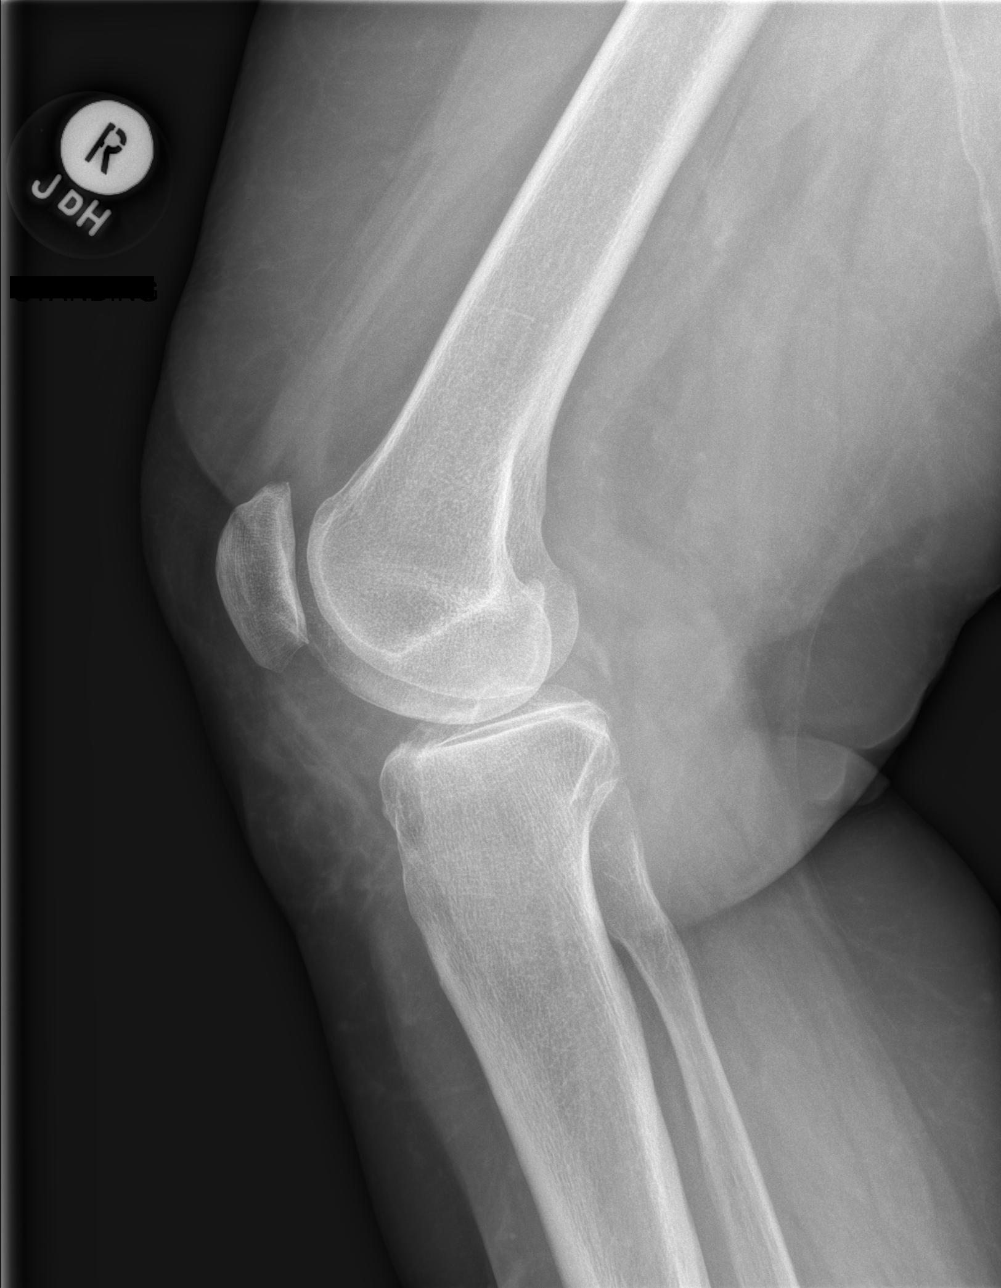

[x knee tunnel right]
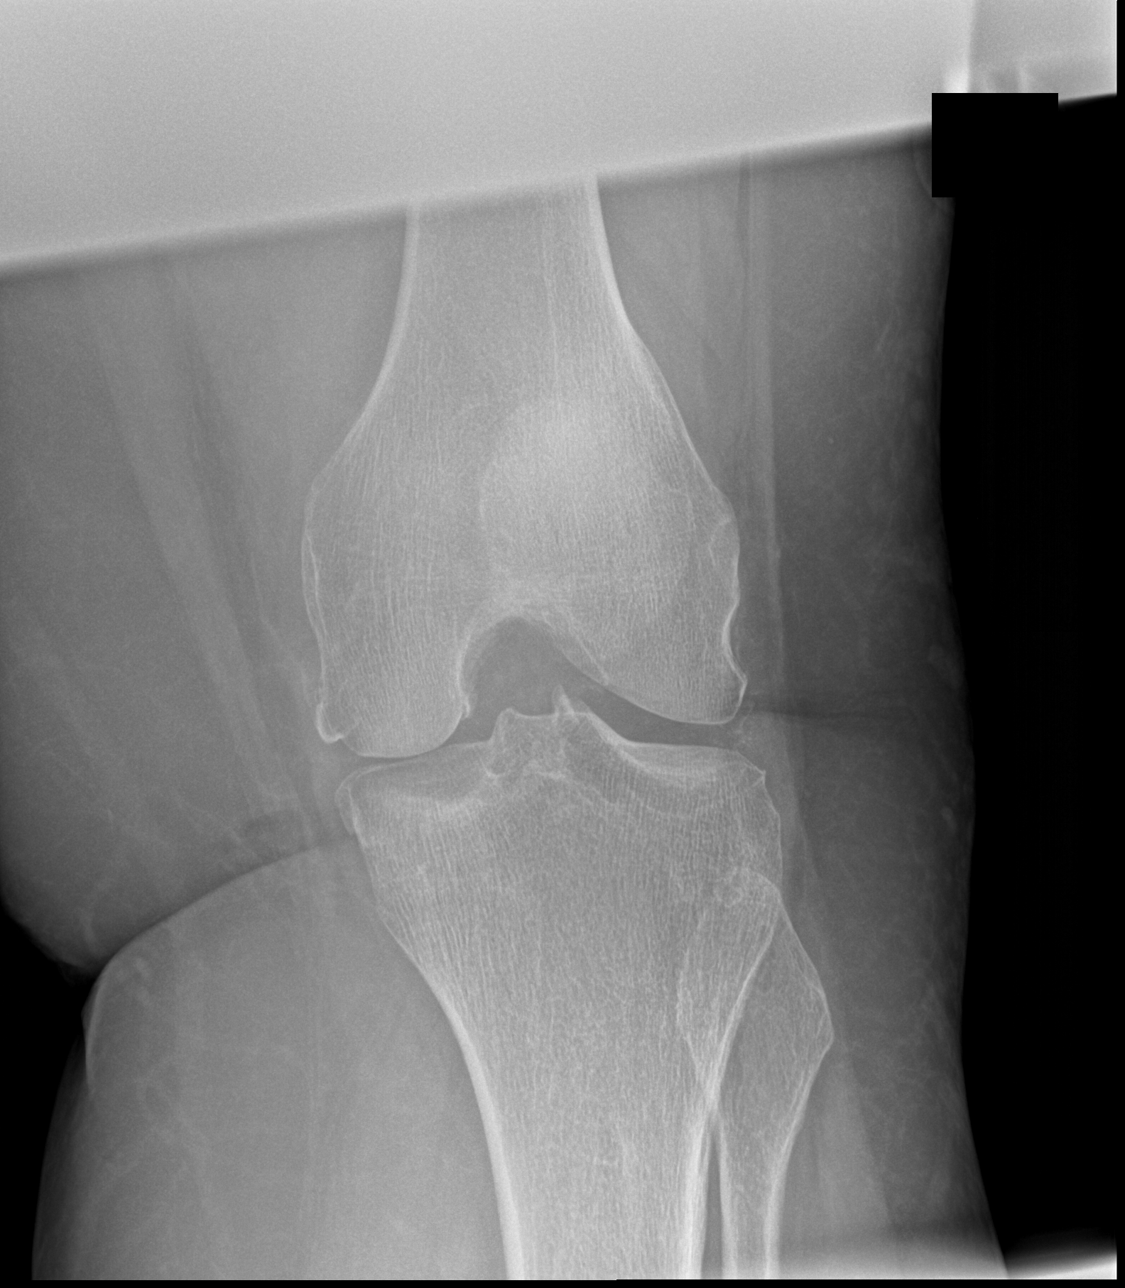

[x knee sunrise right]
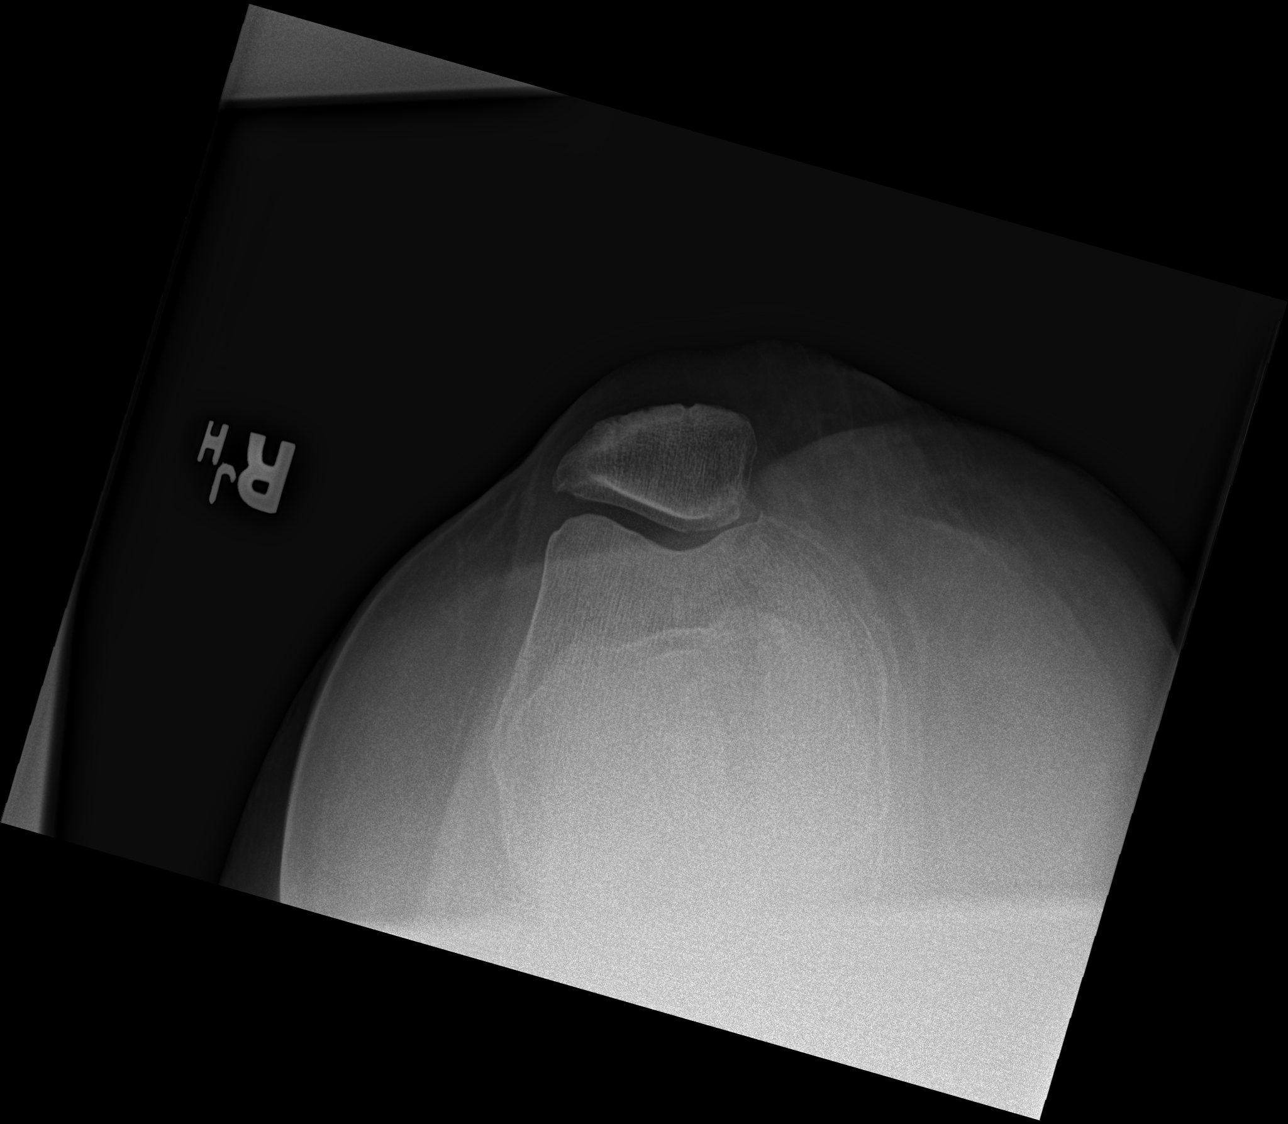

[4 of 4 positions shown; findings below may reference images not displayed]

FINDINGS: Degenerative changes in the right knee, most pronounced in the
medial compartment with joint space narrowing and early spurring.
Trace joint effusion. No acute bony abnormality. Specifically, no
fracture, subluxation, or dislocation. Soft tissues are intact.
IMPRESSION: Degenerative changes with trace joint effusion. No acute bony
abnormality.

## 2016-03-13 ENCOUNTER — Other Ambulatory Visit: Payer: Self-pay | Admitting: Internal Medicine

## 2016-03-13 DIAGNOSIS — Z1231 Encounter for screening mammogram for malignant neoplasm of breast: Secondary | ICD-10-CM

## 2016-04-10 ENCOUNTER — Ambulatory Visit
Admission: RE | Admit: 2016-04-10 | Discharge: 2016-04-10 | Disposition: A | Discharge status: Home / Self Care | Payer: Medicare Other | Source: Ambulatory Visit | Attending: Internal Medicine | Admitting: Internal Medicine

## 2016-04-10 DIAGNOSIS — Z1231 Encounter for screening mammogram for malignant neoplasm of breast: Secondary | ICD-10-CM

## 2016-06-27 ENCOUNTER — Other Ambulatory Visit: Payer: Self-pay | Admitting: Obstetrics and Gynecology

## 2016-06-28 NOTE — Telephone Encounter (Signed)
Medication refill request: Nystatin cream Last OV:  0000000 for Lichen Sclerosis - JJ Next AEX: 07/28/16 PG Last MMG (if hormonal medication request): 04/10/16 BIRADS 1 negative Refill authorized: 10/12/15 #30g w/1 refill; today please advise

## 2016-06-28 NOTE — Telephone Encounter (Signed)
H/O candida intertrigo, will refill script. Please ask her to come in for evaluation if she isn't feeling better with the medication.

## 2016-07-03 NOTE — Telephone Encounter (Signed)
Spoke with patient. Advised of message as seen below from Dr.Jertson. Patient is agreeable and verbalizes understanding.  Routing to provider for final review. Patient agreeable to disposition. Will close encounter.  

## 2016-07-03 NOTE — Telephone Encounter (Signed)
Patient returned call after hours and left a message to call her back.

## 2016-07-05 ENCOUNTER — Ambulatory Visit (INDEPENDENT_AMBULATORY_CARE_PROVIDER_SITE_OTHER): Payer: Medicare Other | Admitting: Orthopedic Surgery

## 2016-07-28 ENCOUNTER — Encounter: Payer: Self-pay | Admitting: Nurse Practitioner

## 2016-07-28 ENCOUNTER — Ambulatory Visit (INDEPENDENT_AMBULATORY_CARE_PROVIDER_SITE_OTHER): Payer: Medicare Other | Admitting: Nurse Practitioner

## 2016-07-28 VITALS — BP 122/84 | HR 64 | Ht 65.0 in | Wt 201.0 lb

## 2016-07-28 DIAGNOSIS — N904 Leukoplakia of vulva: Secondary | ICD-10-CM

## 2016-07-28 DIAGNOSIS — Z01411 Encounter for gynecological examination (general) (routine) with abnormal findings: Secondary | ICD-10-CM | POA: Diagnosis not present

## 2016-07-28 DIAGNOSIS — Z0000 Encounter for general adult medical examination without abnormal findings: Secondary | ICD-10-CM

## 2016-07-28 MED ORDER — BETAMETHASONE VALERATE 0.1 % EX OINT: TOPICAL_OINTMENT | CUTANEOUS | 3 refills | Status: DC

## 2016-07-28 MED ORDER — NYSTATIN 100000 UNIT/GM EX CREA: TOPICAL_CREAM | CUTANEOUS | 4 refills | Status: DC

## 2016-07-28 NOTE — Patient Instructions (Signed)

## 2016-07-28 NOTE — Progress Notes (Signed)
Patient ID: [name], female   DOB: 1946/03/01, 70 y.o.   MRN: [id number]  70 y.o. [id number] Widowed  Caucasian Fe here for annual exam.  No new diagnosis.  Now new skin cancer right lower leg with treatment with Cryo.  Right knee pain that is better with exercise.  She now has a greater loss of BMD with last study and PCP wanted her to take medication - she could not afford so did not start.  Advised her to return and look at other options such as Prolia.  Patient's last menstrual period was 08/14/1985.          Sexually active: No.  The current method of family planning is post menopausal status.    Exercising: Yes.    dancing Smoker:  no  Health Maintenance: Pap:08/15/2012, Negative with neg HR HPV MMG:04/10/16, 3D, Bi-Rads 1: negative Colonoscopy:07/29/2009, normal, repeat in 10 years BMD:11/26/15, T Score: -2.9 Spine / -2.6 Right Femur Neck TDaP: 2007 Shingles vaccine 3 years ago Pneumovax 2008 Hep C: has given blood in the past 10 yrs. Labs: PCP takes care of all labs   reports that she has never smoked. She has never used smokeless tobacco. She reports that she does not use drugs.  Past Medical History:  Diagnosis Date  . COPD (chronic obstructive pulmonary disease) ([place])   . Glaucoma   . History of recurrent UTIs   . Hyperlipidemia   . Hypertension   . Lichen sclerosus January 2017   vulva/perianal region  . Non-melanoma skin cancer 08/2015   right leg  . Osteopenia   . Osteoporosis   . Post-menopausal      Past Surgical History:  Procedure Laterality Date  . FOOT SURGERY Right 06/2013   right 2nd toe  . HEMATOMA EVACUATION Left 2009   left calf    Current Outpatient Prescriptions  Medication Sig Dispense Refill  . albuterol (PROAIR HFA) 108 (90 BASE) MCG/ACT inhaler Inhale 2 puffs into the lungs every 6 (six) hours as needed for wheezing or shortness of breath.    Marland Kitchen aspirin 81 MG tablet Take 81 mg by mouth daily.    Marland Kitchen atorvastatin (LIPITOR) 20 MG  tablet Take 1 tablet by mouth at bedtime.  11  . betamethasone valerate ointment (VALISONE) 0.1 % Apply a pea sized amount 3 x a week 30 g 1  . budesonide-formoterol (SYMBICORT) 160-4.5 MCG/ACT inhaler Inhale 2 puffs into the lungs every morning.    . calcium carbonate (OS-CAL) 600 MG TABS tablet Take 600 mg by mouth 2 (two) times daily with a meal.    . Cholecalciferol (VITAMIN D-3) 1000 UNITS CAPS Take by mouth daily.    . Coenzyme Q10 (COQ-10 PO) Take by mouth.    . irbesartan (AVAPRO) 150 MG tablet Take 150 mg by mouth daily.    Marland Kitchen latanoprost (XALATAN) 0.005 % ophthalmic solution Place 1 drop into both eyes at bedtime.    . Methen-Hyosc-Meth Blue-Na Phos (UROGESIC-BLUE) 81.6 MG TABS Take 1 tablet by mouth as needed.    . Multiple Vitamins-Minerals (CENTRUM SILVER ADULT 50+ PO) Take by mouth daily.    . Multiple Vitamins-Minerals (ICAPS MV PO) Take 1 tablet by mouth daily.    Marland Kitchen nystatin cream (MYCOSTATIN) APPLY EXTERNALLY TO THE AFFECTED AREA TWICE DAILY FOR UP TO 14 DAYS 30 g 0  . Omega-3 Fatty Acids (FISH OIL) 1000 MG CAPS Take 1 capsule by mouth daily.    . Psyllium (METAMUCIL PO) Take by mouth.    Marland Kitchen  ranitidine (ZANTAC) 150 MG tablet Take 150 mg by mouth as needed for heartburn.    . vitamin B-12 (CYANOCOBALAMIN) 250 MCG tablet Take 250 mcg by mouth daily.    . vitamin C (ASCORBIC ACID) 500 MG tablet Take 500 mg by mouth 2 (two) times daily.    . vitamin E 400 UNIT capsule Take 400 Units by mouth daily.     No current facility-administered medications for this visit.     Family History  Problem Relation Age of Onset  . Breast cancer Mother     deceased -47's  . Heart attack Father   . Heart attack Brother     ROS:  Pertinent items are noted in HPI.  Otherwise, a comprehensive ROS was negative.  Exam:   LMP 08/14/1985    Ht Readings from Last 3 Encounters:  10/19/15 5\' 5"  (1.651 m)  10/06/15 5\' 5"  (1.651 m)  09/17/15 5\' 5"  (1.651 m)    General appearance: alert,  cooperative and appears stated age Head: Normocephalic, without obvious abnormality, atraumatic Neck: no adenopathy, supple, symmetrical, trachea midline and thyroid normal to inspection and palpation Lungs: clear to auscultation bilaterally Breasts: normal appearance, no masses or tenderness Heart: regular rate and rhythm Abdomen: soft, non-tender; no masses,  no organomegaly Extremities: extremities normal, atraumatic, no cyanosis or edema Skin: Skin color, texture, turgor normal. No rashes or lesions Lymph nodes: Cervical, supraclavicular, and axillary nodes normal. No abnormal inguinal nodes palpated Neurologic: Grossly normal   Pelvic: External genitalia:  LSA changes without a flare but very atrophic              Urethra:  normal appearing urethra with no masses, tenderness or lesions              Bartholin's and Skene's: normal                 Vagina: atrophic appearing vagina with normal color and discharge, no lesions              Cervix: anteverted              Pap taken: Yes.   per request Bimanual Exam:  Uterus:  normal size, contour, position, consistency, mobility, non-tender              Adnexa: no mass, fullness, tenderness               Rectovaginal: Confirms               Anus:  normal sphincter tone, no lesions  Chaperone present: yes  A:  Well Woman with normal exam  Postmenopausal Osteoporosis - followed by PCP - she declines medication therapy secondary to cost. HTN hypercholesterolemia COPD             LSA - without current flare               P:   Reviewed health and wellness pertinent to exam  Pap smear as above  Mammogram is due 8/18  Refill on Nystatin and valisone that she uses prn for LSA  Follow with pap  Counseled on breast self exam, mammography screening, adequate intake of calcium and vitamin D, diet and exercise return annually or prn  An After Visit Summary was printed and given  to the patient.

## 2016-07-30 NOTE — Progress Notes (Signed)
Encounter reviewed by Dr. Brook Amundson C. Silva.  

## 2016-07-31 LAB — IPS PAP SMEAR ONLY

## 2016-08-25 ENCOUNTER — Telehealth: Payer: Self-pay | Admitting: Nurse Practitioner

## 2016-08-25 NOTE — Telephone Encounter (Signed)
Spoke with patient. Patient states that she received a letter stating that her pap is normal, but that our office may contact her if any additional testing was done or is needed. Explained letter to patient. Advised her pap smear returned normal and no additional testing is needed at this time. Patient verbalizes understanding.  Routing to provider for final review. Patient agreeable to disposition. Will close encounter.

## 2016-08-25 NOTE — Telephone Encounter (Signed)
Left message to call [facility] at [phone].  Notes Recorded by Graylon Good, CMA on 08/01/2016 at 11:13 AM EST 02 recall entered, no AEX scheduled at this time ------  Notes Recorded by Kem Boroughs, FNP on 07/31/2016 at 2:04 PM EST pap02

## 2016-08-25 NOTE — Telephone Encounter (Signed)
Returning a call to Kaitlyn. °

## 2016-08-25 NOTE — Telephone Encounter (Signed)
Patient left voicemail that she received letter about her PAP results and wanted to find out what is going on.

## 2016-11-03 ENCOUNTER — Encounter (INDEPENDENT_AMBULATORY_CARE_PROVIDER_SITE_OTHER): Payer: Self-pay | Admitting: Orthopedic Surgery

## 2016-11-03 ENCOUNTER — Ambulatory Visit (INDEPENDENT_AMBULATORY_CARE_PROVIDER_SITE_OTHER): Payer: Medicare HMO | Admitting: Orthopedic Surgery

## 2016-11-03 ENCOUNTER — Ambulatory Visit (INDEPENDENT_AMBULATORY_CARE_PROVIDER_SITE_OTHER): Payer: Medicare HMO

## 2016-11-03 DIAGNOSIS — M25561 Pain in right knee: Secondary | ICD-10-CM

## 2016-11-03 DIAGNOSIS — G8929 Other chronic pain: Secondary | ICD-10-CM

## 2016-11-03 NOTE — Progress Notes (Signed)
Office Visit Note   Patient: [name]           Date of Birth: June 10, 1946           MRN: [id number] Visit Date: 11/03/2016 Requested by: Jilda Panda, MD [id number] [place], [place] [address] PCP: Jilda Panda, MD  Subjective: Chief Complaint  Patient presents with  . Right Knee - Pain   HPI: [name] is a 71 year old patient with right knee and leg pain.  She reports pain for the last few months.  Denies any history of injury.  She also concerned about a throbbing pain in the right lower leg.  She had a skin cancer which is basal cell cancer removed and now she has pain.  She is concerned about that site with potential underlying pathology.  She states that her right knee is weak and giving way.  She describes swelling.  She wants to be better because she is going to [place] in May.               ROS: All systems reviewed are negative as they relate to the chief complaint within the history of present illness.  Patient denies  fevers or chills.   Assessment & Plan: Visit Diagnoses:  1. Chronic pain of right knee     Plan: Impression is right knee pain with bone-on-bone changes in the medial compartment on radiographs today.  We may consider an injection May 17 prior to her [place] trip.  I think that skin cancer site has no underlying lesions and therefore does not require further workup.  I'll see her back in mid May for knee injection prior to her trip.  She may need to consider knee replacement in the near future.  Follow-Up Instructions: Return in about 7 weeks (around 12/21/2016).   Orders:  Orders Placed This Encounter  Procedures  . XR Knee 1-2 Views Right   No orders of the defined types were placed in this encounter.     Procedures: No procedures performed   Clinical Data: No additional findings.  Objective: Vital Signs: LMP 08/14/1985 (Approximate)   Physical Exam:   Constitutional: Patient appears well-developed HEENT:  Head:  Normocephalic Eyes:EOM are normal Neck: Normal range of motion Cardiovascular: Normal rate Pulmonary/chest: Effort normal Neurologic: Patient is alert Skin: Skin is warm Psychiatric: Patient has normal mood and affect    Ortho Exam: Orthopedic exam demonstrates increased body mass index with slightly antalgic gait to the right no knee effusion is present.  Skin cancer excision site on the anterior compartment mid tibia is normal.  No underlying masses or proximal lymphadenopathy is present.  Knee range of motion otherwise normal.  Pedal pulses intact.  No effusion in the knee.  Range of motion is past 90 easily.  Extensor mechanism is intact.  Specialty Comments:  No specialty comments available.  Imaging: No results found.   PMFS History: Patient Active Problem List   Diagnosis Date Noted  . Status post foot surgery 06/27/2013  . Other hammer toe (acquired) 06/10/2013  . Pain in foot 06/10/2013  . Onychomycosis 06/10/2013   Past Medical History:  Diagnosis Date  . COPD (chronic obstructive pulmonary disease) ([place])   . Glaucoma   . History of recurrent UTIs   . Hyperlipidemia   . Hypertension   . Lichen sclerosus January 2017   vulva/perianal region  . Non-melanoma skin cancer 08/2015   right leg  . Osteopenia   . Osteoporosis   .  Post-menopausal      Family History  Problem Relation Age of Onset  . Breast cancer Mother     deceased -89's  . Heart attack Father   . Heart attack Brother     Past Surgical History:  Procedure Laterality Date  . FOOT SURGERY Right 06/2013   right 2nd toe  . HEMATOMA EVACUATION Left 2009   left calf   Social History   Occupational History  . Not on file.   Social History Main Topics  . Smoking status: Never Smoker  . Smokeless tobacco: Never Used  . Alcohol use Not on file  . Drug use: No  . Sexual activity: Yes    Birth control/ protection: Post-menopausal

## 2016-12-25 ENCOUNTER — Encounter (INDEPENDENT_AMBULATORY_CARE_PROVIDER_SITE_OTHER): Payer: Self-pay | Admitting: Orthopedic Surgery

## 2016-12-25 ENCOUNTER — Ambulatory Visit (INDEPENDENT_AMBULATORY_CARE_PROVIDER_SITE_OTHER): Payer: Medicare HMO | Admitting: Orthopedic Surgery

## 2016-12-25 ENCOUNTER — Ambulatory Visit (INDEPENDENT_AMBULATORY_CARE_PROVIDER_SITE_OTHER): Payer: Medicare HMO

## 2016-12-25 DIAGNOSIS — G8929 Other chronic pain: Secondary | ICD-10-CM

## 2016-12-25 DIAGNOSIS — M1712 Unilateral primary osteoarthritis, left knee: Secondary | ICD-10-CM

## 2016-12-25 DIAGNOSIS — M25561 Pain in right knee: Secondary | ICD-10-CM | POA: Diagnosis not present

## 2016-12-25 DIAGNOSIS — M1711 Unilateral primary osteoarthritis, right knee: Secondary | ICD-10-CM | POA: Diagnosis not present

## 2016-12-28 DIAGNOSIS — M1712 Unilateral primary osteoarthritis, left knee: Secondary | ICD-10-CM

## 2016-12-28 DIAGNOSIS — M1711 Unilateral primary osteoarthritis, right knee: Secondary | ICD-10-CM | POA: Diagnosis not present

## 2016-12-28 MED ORDER — LIDOCAINE HCL 1 % IJ SOLN
5.0000 mL | INTRAMUSCULAR | Status: AC | PRN
  Administered 2016-12-28: 5 mL

## 2016-12-28 MED ORDER — BUPIVACAINE HCL 0.25 % IJ SOLN
4.0000 mL | INTRAMUSCULAR | Status: AC | PRN
  Administered 2016-12-28: 4 mL via INTRA_ARTICULAR

## 2016-12-28 MED ORDER — METHYLPREDNISOLONE ACETATE 40 MG/ML IJ SUSP
40.0000 mg | INTRAMUSCULAR | Status: AC | PRN
  Administered 2016-12-28: 40 mg via INTRA_ARTICULAR

## 2016-12-28 NOTE — Progress Notes (Signed)
Office Visit Note   Patient: [name]           Date of Birth: 12/07/1945           MRN: [id number] Visit Date: 12/25/2016 Requested by: Jilda Panda, MD [address], [place] [address] PCP: Jilda Panda, MD  Subjective: Chief Complaint  Patient presents with  . Right Knee - Follow-up, Pain    HPI: Ilham is a 71 year old patient with known right knee arthritis.  She is going on a trip to [place] in the near future and would like to have her knee injected.  She reports continued pain but no real mechanical symptoms.  She does have a modicum of night pain and rest pain with the right knee.  She is also developing gradual onset of similar left-sided pain.  Denies any history of injury or mechanical symptoms in the left knee              ROS: All systems reviewed are negative as they relate to the chief complaint within the history of present illness.  Patient denies  fevers or chills.   Assessment & Plan: Visit Diagnoses:  1. Chronic pain of right knee   2. Unilateral primary osteoarthritis, left knee   3. Unilateral primary osteoarthritis, right knee     Plan: Impression is right knee arthritis and left knee arthritis.  The left knee is not as severe as the right knee.  Injection right knee performed today.  Injection left knee also perform today because of the presence of arthritis.  Nonweightbearing quad strengthening exercises encouraged.  She is going to need knee replacement sometime in the future but she'll know the best time for that.  I'll see her back as needed  Follow-Up Instructions: Return if symptoms worsen or fail to improve.   Orders:  Orders Placed This Encounter  Procedures  . XR Knee 1-2 Views Left   No orders of the defined types were placed in this encounter.     Procedures: Large Joint Inj Date/Time: 12/28/2016 7:13 AM Performed by: Meredith Pel Authorized by: Meredith Pel   Consent Given by:  Patient Site  marked: the procedure site was marked   Timeout: prior to procedure the correct patient, procedure, and site was verified   Indications:  Pain, joint swelling and diagnostic evaluation Location:  Knee Site:  R knee Prep: patient was prepped and draped in usual sterile fashion   Needle Size:  18 G Needle Length:  1.5 inches Approach:  Superolateral Ultrasound Guidance: No   Fluoroscopic Guidance: No   Arthrogram: No   Medications:  5 mL lidocaine 1 %; 4 mL bupivacaine 0.25 %; 40 mg methylPREDNISolone acetate 40 MG/ML Patient tolerance:  Patient tolerated the procedure well with no immediate complications Large Joint Inj Date/Time: 12/28/2016 7:13 AM Performed by: Meredith Pel Authorized by: Meredith Pel   Consent Given by:  Patient Site marked: the procedure site was marked   Timeout: prior to procedure the correct patient, procedure, and site was verified   Indications:  Pain, joint swelling and diagnostic evaluation Location:  Knee Site:  L knee Prep: patient was prepped and draped in usual sterile fashion   Needle Size:  18 G Needle Length:  1.5 inches Approach:  Superolateral Ultrasound Guidance: No   Fluoroscopic Guidance: No   Arthrogram: No   Medications:  5 mL lidocaine 1 %; 4 mL bupivacaine 0.25 %; 40 mg methylPREDNISolone acetate 40 MG/ML  Patient tolerance:  Patient tolerated the procedure well with no immediate complications     Clinical Data: No additional findings.  Objective: Vital Signs: LMP 08/14/1985 (Approximate)   Physical Exam:   Constitutional: Patient appears well-developed HEENT:  Head: Normocephalic Eyes:EOM are normal Neck: Normal range of motion Cardiovascular: Normal rate Pulmonary/chest: Effort normal Neurologic: Patient is alert Skin: Skin is warm Psychiatric: Patient has normal mood and affect    Ortho Exam: Patient has normal gait and alignment.  Palpable pedal pulses bilaterally.  Mild patellofemoral crepitus with  no real effusion in either knee.  Collateral and cruciate ligaments are stable.  Range of motion is easily past 90 of flexion in both knees but with a 5-7 flexion contracture in the right knee compared to the left.  No other masses lymph adenopathy or skin changes noted in the knee region bilaterally  Specialty Comments:  No specialty comments available.  Imaging: No results found.   PMFS History: Patient Active Problem List   Diagnosis Date Noted  . Status post foot surgery 06/27/2013  . Other hammer toe (acquired) 06/10/2013  . Pain in foot 06/10/2013  . Onychomycosis 06/10/2013   Past Medical History:  Diagnosis Date  . COPD (chronic obstructive pulmonary disease) ([place])   . Glaucoma   . History of recurrent UTIs   . Hyperlipidemia   . Hypertension   . Lichen sclerosus January 2017   vulva/perianal region  . Non-melanoma skin cancer 08/2015   right leg  . Osteopenia   . Osteoporosis   . Post-menopausal      Family History  Problem Relation Age of Onset  . Breast cancer Mother        deceased -11's  . Heart attack Father   . Heart attack Brother     Past Surgical History:  Procedure Laterality Date  . FOOT SURGERY Right 06/2013   right 2nd toe  . HEMATOMA EVACUATION Left 2009   left calf   Social History   Occupational History  . Not on file.   Social History Main Topics  . Smoking status: Never Smoker  . Smokeless tobacco: Never Used  . Alcohol use Not on file  . Drug use: No  . Sexual activity: Yes    Birth control/ protection: Post-menopausal

## 2017-01-24 ENCOUNTER — Ambulatory Visit (INDEPENDENT_AMBULATORY_CARE_PROVIDER_SITE_OTHER): Payer: Medicare HMO | Admitting: Obstetrics and Gynecology

## 2017-01-24 ENCOUNTER — Encounter: Payer: Self-pay | Admitting: Obstetrics and Gynecology

## 2017-01-24 VITALS — BP 130/62 | HR 48 | Resp 16 | Wt 200.0 lb

## 2017-01-24 DIAGNOSIS — N762 Acute vulvitis: Secondary | ICD-10-CM

## 2017-01-24 DIAGNOSIS — L900 Lichen sclerosus et atrophicus: Secondary | ICD-10-CM | POA: Diagnosis not present

## 2017-01-24 DIAGNOSIS — N3941 Urge incontinence: Secondary | ICD-10-CM | POA: Diagnosis not present

## 2017-01-24 MED ORDER — NYSTATIN 100000 UNIT/GM EX OINT: 1.0000 "application " | TOPICAL_OINTMENT | Freq: Two times a day (BID) | CUTANEOUS | 0 refills | Status: DC

## 2017-01-24 MED ORDER — BETAMETHASONE VALERATE 0.1 % EX OINT: 1.0000 "application " | TOPICAL_OINTMENT | Freq: Two times a day (BID) | CUTANEOUS | 0 refills | Status: DC

## 2017-01-24 NOTE — Progress Notes (Signed)
GYNECOLOGY  VISIT   HPI: 71 y.o.   Widowed  Caucasian  female   [phone] with Patient's last menstrual period was 08/14/1985 (approximate).   here c/o vaginal irritation and itching. She has a h/o lichen sclerosis and candida intertrigo.  The patient had a sore throat the end of May, she got a "shot" from her primary MD. Not sure if she got an antibiotic. She got worse, developed a bad cough. She was treated with an antibiotic. She started having vulva itching after taking the antibiotics. Her itching has gotten bad, very sore. No vaginal discharge.  Her lichen sclerosis has been under control with 2 x a week steroid ointment. Urgent care gave her 2 pills to get rid of her "yeast infection".  New onset urge incontinence over the last 4-5 months. Occurs intermittently, she can empty her bladder when she is getting close to home. She is wearing depends.   GYNECOLOGIC HISTORY: Patient's last menstrual period was 08/14/1985 (approximate). Dictation #1 [id number]  [id number]  Contraception:postmenopause  Menopausal hormone therapy: none         OB History    Gravida Para Term Preterm AB Living   6 1 1  0 5 1   SAB TAB Ectopic Multiple Live Births   5 0 0 0 1         Patient Active Problem List   Diagnosis Date Noted  . Status post foot surgery 06/27/2013  . Other hammer toe (acquired) 06/10/2013  . Pain in foot 06/10/2013  . Onychomycosis 06/10/2013    Past Medical History:  Diagnosis Date  . COPD (chronic obstructive pulmonary disease) ([place])   . Glaucoma   . History of recurrent UTIs   . Hyperlipidemia   . Hypertension   . Lichen sclerosus January 2017   vulva/perianal region  . Non-melanoma skin cancer 08/2015   right leg  . Osteopenia   . Osteoporosis   . Post-menopausal      Past Surgical History:  Procedure Laterality Date  . FOOT SURGERY Right 06/2013   right 2nd toe  . HEMATOMA EVACUATION Left 2009   left calf    Current Outpatient Prescriptions   Medication Sig Dispense Refill  . albuterol (PROAIR HFA) 108 (90 BASE) MCG/ACT inhaler Inhale 2 puffs into the lungs every 6 (six) hours as needed for wheezing or shortness of breath.    Marland Kitchen aspirin 81 MG tablet Take 81 mg by mouth daily.    Marland Kitchen atorvastatin (LIPITOR) 20 MG tablet Take 1 tablet by mouth at bedtime.  11  . betamethasone valerate ointment (VALISONE) 0.1 % Apply a pea sized amount 3 x a week 30 g 3  . budesonide-formoterol (SYMBICORT) 160-4.5 MCG/ACT inhaler Inhale 2 puffs into the lungs every morning.    . calcium carbonate (OS-CAL) 600 MG TABS tablet Take 600 mg by mouth 2 (two) times daily with a meal.    . Cholecalciferol (VITAMIN D-3) 1000 UNITS CAPS Take by mouth daily.    . Coenzyme Q10 (COQ-10 PO) Take by mouth.    . irbesartan (AVAPRO) 150 MG tablet Take 150 mg by mouth daily.    Marland Kitchen latanoprost (XALATAN) 0.005 % ophthalmic solution Place 1 drop into both eyes at bedtime.    . Methen-Hyosc-Meth Blue-Na Phos (UROGESIC-BLUE) 81.6 MG TABS Take 1 tablet by mouth as needed.    . Multiple Vitamins-Minerals (CENTRUM SILVER ADULT 50+ PO) Take by mouth daily.    . Multiple Vitamins-Minerals (ICAPS MV PO) Take 1 tablet by mouth daily.    Marland Kitchen  nystatin cream (MYCOSTATIN) APPLY EXTERNALLY TO THE AFFECTED AREA TWICE DAILY FOR UP TO 14 DAYS 30 g 4  . Omega-3 Fatty Acids (FISH OIL) 1000 MG CAPS Take 1 capsule by mouth daily.    . Psyllium (METAMUCIL PO) Take by mouth.    . ranitidine (ZANTAC) 150 MG tablet Take 150 mg by mouth as needed for heartburn.    . vitamin B-12 (CYANOCOBALAMIN) 250 MCG tablet Take 250 mcg by mouth daily.    . vitamin C (ASCORBIC ACID) 500 MG tablet Take 500 mg by mouth 2 (two) times daily.    . vitamin E 400 UNIT capsule Take 400 Units by mouth daily.     No current facility-administered medications for this visit.      ALLERGIES: Contrast media [iodinated diagnostic agents]; Ether; and Iohexol  Family History  Problem Relation Age of Onset  . Breast cancer  Mother        deceased -58's  . Heart attack Father   . Heart attack Brother     Social History   Social History  . Marital status: Widowed    Spouse name: N/A  . Number of children: N/A  . Years of education: N/A   Occupational History  . Not on file.   Social History Main Topics  . Smoking status: Never Smoker  . Smokeless tobacco: Never Used  . Alcohol use Not on file  . Drug use: No  . Sexual activity: Yes    Birth control/ protection: Post-menopausal   Other Topics Concern  . Not on file   Social History Narrative  . No narrative on file    Review of Systems  Constitutional: Negative.   HENT: Negative.   Eyes: Negative.   Respiratory: Negative.   Cardiovascular: Negative.   Gastrointestinal: Negative.   Genitourinary:       Vaginal irritation and itching   Musculoskeletal: Negative.   Skin: Negative.   Neurological: Negative.   Endo/Heme/Allergies: Negative.   Psychiatric/Behavioral: Negative.     PHYSICAL EXAMINATION:    BP 130/62 (BP Location: Right Arm, Patient Position: Sitting, Cuff Size: Normal)   Pulse (!) 48   Resp 16   Wt 200 lb (90.7 kg)   LMP 08/14/1985 (Approximate)   BMI 33.28 kg/m     General appearance: alert, cooperative and appears stated age  Pelvic: External genitalia:  Mild whitening on the left labia minora, mild agglutination of the labia minora to majora. Red on the egde of the labia minora, she has erythema of the labia majora to the              Urethra:  normal appearing urethra with no masses, tenderness or lesions              Bartholins and Skenes: normal                 Vagina: atrophic appearing vagina, no vaginal discharge              Cervix: no lesions               Chaperone was present for exam.  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  New urge incontinence     PLAN Will treat with nystatin ointment BID Valisone ointment BID Discussed voiding in water Vaseline use prn Wet prep probe sent Check urine for ua, c&s If she doesn't have an infection  will further discuss the option of medication to control her urge incontinence    An After Visit Summary was printed and given to the patient.

## 2017-01-25 LAB — URINALYSIS, MICROSCOPIC ONLY: CASTS: NONE SEEN /LPF

## 2017-01-26 LAB — URINE CULTURE

## 2017-01-26 LAB — VAGINITIS/VAGINOSIS, DNA PROBE
Candida Species: NEGATIVE
Gardnerella vaginalis: NEGATIVE
TRICHOMONAS VAG: NEGATIVE

## 2017-01-29 ENCOUNTER — Telehealth: Payer: Self-pay | Admitting: *Deleted

## 2017-01-29 NOTE — Telephone Encounter (Signed)
Left message to call regarding lab results -eh 

## 2017-01-29 NOTE — Telephone Encounter (Signed)
-----   Message from Salvadore Dom, MD sent at 01/29/2017 11:35 AM EDT ----- Please let the patient know that she does have a UTI and treat with macrobid 100 mg BID x 5 days. She should let me know if her urge incontinence doesn't improve with treatment.  Vaginitis culture was negative for infection. See if she is feeling better with the nystatin and valisone treatment.

## 2017-01-30 ENCOUNTER — Ambulatory Visit: Payer: Medicare HMO | Admitting: Obstetrics and Gynecology

## 2017-01-30 MED ORDER — NITROFURANTOIN MONOHYD MACRO 100 MG PO CAPS: 100.0000 mg | ORAL_CAPSULE | Freq: Two times a day (BID) | ORAL | 0 refills | Status: AC

## 2017-01-30 NOTE — Telephone Encounter (Signed)
Spoke with patient. She has been advised of her lab results. Medication sent to [facility] on file per patient's request.

## 2017-01-30 NOTE — Telephone Encounter (Signed)
Patient returning your call.

## 2017-01-30 NOTE — Progress Notes (Deleted)
. GYNECOLOGY  VISIT   HPI: 71 y.o.   Widowed  {Race/ethnicity:17218}  female   [phone] with Patient's last menstrual period was 08/14/1985 (approximate).   here for 1week recheck of vulvar rash   GYNECOLOGIC HISTORY: Patient's last menstrual period was 08/14/1985 (approximate). Contraception: post menopausal Menopausal hormone therapy: none        OB History    Gravida Para Term Preterm AB Living   6 1 1  0 5 1   SAB TAB Ectopic Multiple Live Births   5 0 0 0 1         Patient Active Problem List   Diagnosis Date Noted  . Status post foot surgery 06/27/2013  . Other hammer toe (acquired) 06/10/2013  . Pain in foot 06/10/2013  . Onychomycosis 06/10/2013    Past Medical History:  Diagnosis Date  . COPD (chronic obstructive pulmonary disease) ([place])   . Glaucoma   . History of recurrent UTIs   . Hyperlipidemia   . Hypertension   . Lichen sclerosus January 2017   vulva/perianal region  . Non-melanoma skin cancer 08/2015   right leg  . Osteopenia   . Osteoporosis   . Post-menopausal      Past Surgical History:  Procedure Laterality Date  . FOOT SURGERY Right 06/2013   right 2nd toe  . HEMATOMA EVACUATION Left 2009   left calf    Current Outpatient Prescriptions  Medication Sig Dispense Refill  . albuterol (PROAIR HFA) 108 (90 BASE) MCG/ACT inhaler Inhale 2 puffs into the lungs every 6 (six) hours as needed for wheezing or shortness of breath.    Marland Kitchen aspirin 81 MG tablet Take 81 mg by mouth daily.    Marland Kitchen atorvastatin (LIPITOR) 20 MG tablet Take 1 tablet by mouth at bedtime.  11  . betamethasone valerate ointment (VALISONE) 0.1 % Apply a pea sized amount 3 x a week 30 g 3  . betamethasone valerate ointment (VALISONE) 0.1 % Apply 1 application topically 2 (two) times daily. For 1-2 weeks as needed 30 g 0  . budesonide-formoterol (SYMBICORT) 160-4.5 MCG/ACT inhaler Inhale 2 puffs into the lungs every morning.    . calcium carbonate (OS-CAL) 600 MG TABS tablet Take 600 mg  by mouth 2 (two) times daily with a meal.    . Cholecalciferol (VITAMIN D-3) 1000 UNITS CAPS Take by mouth daily.    . Coenzyme Q10 (COQ-10 PO) Take by mouth.    . irbesartan (AVAPRO) 150 MG tablet Take 150 mg by mouth daily.    Marland Kitchen latanoprost (XALATAN) 0.005 % ophthalmic solution Place 1 drop into both eyes at bedtime.    . Methen-Hyosc-Meth Blue-Na Phos (UROGESIC-BLUE) 81.6 MG TABS Take 1 tablet by mouth as needed.    . Multiple Vitamins-Minerals (CENTRUM SILVER ADULT 50+ PO) Take by mouth daily.    . Multiple Vitamins-Minerals (ICAPS MV PO) Take 1 tablet by mouth daily.    Marland Kitchen nystatin cream (MYCOSTATIN) APPLY EXTERNALLY TO THE AFFECTED AREA TWICE DAILY FOR UP TO 14 DAYS 30 g 4  . nystatin ointment (MYCOSTATIN) Apply 1 application topically 2 (two) times daily. Apply to affected area for up to 7 days. 30 g 0  . Omega-3 Fatty Acids (FISH OIL) 1000 MG CAPS Take 1 capsule by mouth daily.    . Psyllium (METAMUCIL PO) Take by mouth.    . ranitidine (ZANTAC) 150 MG tablet Take 150 mg by mouth as needed for heartburn.    . vitamin B-12 (CYANOCOBALAMIN) 250 MCG tablet Take  250 mcg by mouth daily.    . vitamin C (ASCORBIC ACID) 500 MG tablet Take 500 mg by mouth 2 (two) times daily.    . vitamin E 400 UNIT capsule Take 400 Units by mouth daily.     No current facility-administered medications for this visit.      ALLERGIES: Contrast media [iodinated diagnostic agents]; Ether; and Iohexol  Family History  Problem Relation Age of Onset  . Breast cancer Mother        deceased -70's  . Heart attack Father   . Heart attack Brother     Social History   Social History  . Marital status: Widowed    Spouse name: N/A  . Number of children: N/A  . Years of education: N/A   Occupational History  . Not on file.   Social History Main Topics  . Smoking status: Never Smoker  . Smokeless tobacco: Never Used  . Alcohol use Not on file  . Drug use: No  . Sexual activity: Yes    Birth control/  protection: Post-menopausal   Other Topics Concern  . Not on file   Social History Narrative  . No narrative on file    ROS  PHYSICAL EXAMINATION:    LMP 08/14/1985 (Approximate)     General appearance: alert, cooperative and appears stated age Neck: no adenopathy, supple, symmetrical, trachea midline and thyroid {CHL AMB PHY EX THYROID NORM [phone]::"normal to inspection and palpation"} Breasts: {Exam; breast:13139::"normal appearance, no masses or tenderness"} Abdomen: soft, non-tender; bowel sounds normal; no masses,  no organomegaly  Pelvic: External genitalia:  no lesions              Urethra:  normal appearing urethra with no masses, tenderness or lesions              Bartholins and Skenes: normal                 Vagina: normal appearing vagina with normal color and discharge, no lesions              Cervix: {CHL AMB PHY EX CERVIX NORM DEFAULT:[phone]::"no lesions"}              Bimanual Exam:  Uterus:  {CHL AMB PHY EX UTERUS NORM DEFAULT:[phone]::"normal size, contour, position, consistency, mobility, non-tender"}              Adnexa: {CHL AMB PHY EX ADNEXA NO MASS DEFAULT:[id number]::"no mass, fullness, tenderness"}              Rectovaginal: {yes no:314532}.  Confirms.              Anus:  normal sphincter tone, no lesions  Chaperone was present for exam.  ASSESSMENT     PLAN    An After Visit Summary was printed and given to the patient.  *** minutes face to face time of which over 50% was spent in counseling.

## 2017-01-31 ENCOUNTER — Ambulatory Visit: Payer: Medicare HMO | Admitting: Obstetrics and Gynecology

## 2017-02-06 ENCOUNTER — Ambulatory Visit: Payer: Self-pay | Admitting: Obstetrics and Gynecology

## 2017-02-06 ENCOUNTER — Telehealth: Payer: Self-pay | Admitting: Obstetrics and Gynecology

## 2017-02-06 NOTE — Progress Notes (Deleted)
GYNECOLOGY  VISIT   HPI: 71 y.o.   Widowed  Caucasian  female   [phone] with Patient's last menstrual period was 08/14/1985 (approximate).   here for  Vulvar rash & uti  GYNECOLOGIC HISTORY: Patient's last menstrual period was 08/14/1985 (approximate). Contraception: post menopause Menopausal hormone therapy: none        OB History    Gravida Para Term Preterm AB Living   6 1 1  0 5 1   SAB TAB Ectopic Multiple Live Births   5 0 0 0 1         Patient Active Problem List   Diagnosis Date Noted  . Status post foot surgery 06/27/2013  . Other hammer toe (acquired) 06/10/2013  . Pain in foot 06/10/2013  . Onychomycosis 06/10/2013    Past Medical History:  Diagnosis Date  . COPD (chronic obstructive pulmonary disease) ([place])   . Glaucoma   . History of recurrent UTIs   . Hyperlipidemia   . Hypertension   . Lichen sclerosus January 2017   vulva/perianal region  . Non-melanoma skin cancer 08/2015   right leg  . Osteopenia   . Osteoporosis   . Post-menopausal      Past Surgical History:  Procedure Laterality Date  . FOOT SURGERY Right 06/2013   right 2nd toe  . HEMATOMA EVACUATION Left 2009   left calf    Current Outpatient Prescriptions  Medication Sig Dispense Refill  . albuterol (PROAIR HFA) 108 (90 BASE) MCG/ACT inhaler Inhale 2 puffs into the lungs every 6 (six) hours as needed for wheezing or shortness of breath.    Marland Kitchen aspirin 81 MG tablet Take 81 mg by mouth daily.    Marland Kitchen atorvastatin (LIPITOR) 20 MG tablet Take 1 tablet by mouth at bedtime.  11  . betamethasone valerate ointment (VALISONE) 0.1 % Apply a pea sized amount 3 x a week 30 g 3  . betamethasone valerate ointment (VALISONE) 0.1 % Apply 1 application topically 2 (two) times daily. For 1-2 weeks as needed 30 g 0  . budesonide-formoterol (SYMBICORT) 160-4.5 MCG/ACT inhaler Inhale 2 puffs into the lungs every morning.    . calcium carbonate (OS-CAL) 600 MG TABS tablet Take 600 mg by mouth 2 (two) times daily  with a meal.    . Cholecalciferol (VITAMIN D-3) 1000 UNITS CAPS Take by mouth daily.    . Coenzyme Q10 (COQ-10 PO) Take by mouth.    . irbesartan (AVAPRO) 150 MG tablet Take 150 mg by mouth daily.    Marland Kitchen latanoprost (XALATAN) 0.005 % ophthalmic solution Place 1 drop into both eyes at bedtime.    . Methen-Hyosc-Meth Blue-Na Phos (UROGESIC-BLUE) 81.6 MG TABS Take 1 tablet by mouth as needed.    . Multiple Vitamins-Minerals (CENTRUM SILVER ADULT 50+ PO) Take by mouth daily.    . Multiple Vitamins-Minerals (ICAPS MV PO) Take 1 tablet by mouth daily.    Marland Kitchen nystatin cream (MYCOSTATIN) APPLY EXTERNALLY TO THE AFFECTED AREA TWICE DAILY FOR UP TO 14 DAYS 30 g 4  . nystatin ointment (MYCOSTATIN) Apply 1 application topically 2 (two) times daily. Apply to affected area for up to 7 days. 30 g 0  . Omega-3 Fatty Acids (FISH OIL) 1000 MG CAPS Take 1 capsule by mouth daily.    . Psyllium (METAMUCIL PO) Take by mouth.    . ranitidine (ZANTAC) 150 MG tablet Take 150 mg by mouth as needed for heartburn.    . vitamin B-12 (CYANOCOBALAMIN) 250 MCG tablet Take 250 mcg  by mouth daily.    . vitamin C (ASCORBIC ACID) 500 MG tablet Take 500 mg by mouth 2 (two) times daily.    . vitamin E 400 UNIT capsule Take 400 Units by mouth daily.     No current facility-administered medications for this visit.      ALLERGIES: Contrast media [iodinated diagnostic agents]; Ether; and Iohexol  Family History  Problem Relation Age of Onset  . Breast cancer Mother        deceased -48's  . Heart attack Father   . Heart attack Brother     Social History   Social History  . Marital status: Widowed    Spouse name: N/A  . Number of children: N/A  . Years of education: N/A   Occupational History  . Not on file.   Social History Main Topics  . Smoking status: Never Smoker  . Smokeless tobacco: Never Used  . Alcohol use Not on file  . Drug use: No  . Sexual activity: Yes    Birth control/ protection: Post-menopausal    Other Topics Concern  . Not on file   Social History Narrative  . No narrative on file    ROS  PHYSICAL EXAMINATION:    LMP 08/14/1985 (Approximate)     General appearance: alert, cooperative and appears stated age Neck: no adenopathy, supple, symmetrical, trachea midline and thyroid {CHL AMB PHY EX THYROID NORM [phone]::"normal to inspection and palpation"} Breasts: {Exam; breast:13139::"normal appearance, no masses or tenderness"} Abdomen: soft, non-tender; bowel sounds normal; no masses,  no organomegaly  Pelvic: External genitalia:  no lesions              Urethra:  normal appearing urethra with no masses, tenderness or lesions              Bartholins and Skenes: normal                 Vagina: normal appearing vagina with normal color and discharge, no lesions              Cervix: {CHL AMB PHY EX CERVIX NORM DEFAULT:[phone]::"no lesions"}              Bimanual Exam:  Uterus:  {CHL AMB PHY EX UTERUS NORM [phone]::"normal size, contour, position, consistency, mobility, non-tender"}              Adnexa: {CHL AMB PHY EX ADNEXA NO MASS DEFAULT:[phone]::"no mass, fullness, tenderness"}              Rectovaginal: {yes no:[id number]}.  Confirms.              Anus:  normal sphincter tone, no lesions  Chaperone was present for exam.  ASSESSMENT     PLAN    An After Visit Summary was printed and given to the patient.  *** minutes face to face time of which over 50% was spent in counseling.

## 2017-02-06 NOTE — Telephone Encounter (Signed)
Left message on home and cell voicemail to reschedule cancelled appointment.

## 2017-02-08 ENCOUNTER — Telehealth: Payer: Self-pay | Admitting: *Deleted

## 2017-02-08 ENCOUNTER — Encounter: Payer: Self-pay | Admitting: Obstetrics and Gynecology

## 2017-02-08 ENCOUNTER — Ambulatory Visit (INDEPENDENT_AMBULATORY_CARE_PROVIDER_SITE_OTHER): Payer: Medicare HMO | Admitting: Obstetrics and Gynecology

## 2017-02-08 VITALS — BP 122/60 | HR 68 | Resp 14 | Wt 200.0 lb

## 2017-02-08 DIAGNOSIS — F329 Major depressive disorder, single episode, unspecified: Secondary | ICD-10-CM | POA: Diagnosis not present

## 2017-02-08 DIAGNOSIS — L900 Lichen sclerosus et atrophicus: Secondary | ICD-10-CM

## 2017-02-08 DIAGNOSIS — F32A Depression, unspecified: Secondary | ICD-10-CM

## 2017-02-08 NOTE — Telephone Encounter (Signed)
Spoke with [employer]. Was advised accepts medicare, coverage based on plan, patient needs to call directly to schedule. New provider at [facility] location, Dr. Laroy Apple, scheduling 3-4 weeks out. Providers at [facility] locations 8 weeks. Was advised if patient needs to be seen urgently, go directly to ER.

## 2017-02-08 NOTE — Progress Notes (Signed)
GYNECOLOGY  VISIT   HPI: 71 y.o.   Widowed  Caucasian  female   [phone] with Patient's last menstrual period was 08/14/1985 (approximate).   here for follow up vulvitis. She was seen with a bad vulvitis and UTI 2 weeks ago.  The vulvitis improved with the nystatin and steroid ointment. Urge incontinence has improved since treatment for her UTI. Her itching resolved, just with intermittent itching now. She is using the steroid ointment 2 x a week for her lichen sclerosis.  Her son was in a terrible MVA in 3/18, he has had serious injury to his leg, is in a wheelchair now. Needs more surgery. Is going to loose his job, his house. He has threatened to hurt himself or someone else. She doesn't think he means it, but is worried about him. He is very depressed. She is very worried about him, getting depressed herself. Declines thoughts of hurting herself or anyone else. She declines medication, is open to seeing a therapist (if she can afford it).   GYNECOLOGIC HISTORY: Patient's last menstrual period was 08/14/1985 (approximate). Contraception:postmenopause  Menopausal hormone therapy: none         OB History    Gravida Para Term Preterm AB Living   6 1 1  0 5 1   SAB TAB Ectopic Multiple Live Births   5 0 0 0 1         Patient Active Problem List   Diagnosis Date Noted  . Status post foot surgery 06/27/2013  . Other hammer toe (acquired) 06/10/2013  . Pain in foot 06/10/2013  . Onychomycosis 06/10/2013    Past Medical History:  Diagnosis Date  . COPD (chronic obstructive pulmonary disease) ([place])   . Glaucoma   . History of recurrent UTIs   . Hyperlipidemia   . Hypertension   . Lichen sclerosus January 2017   vulva/perianal region  . Non-melanoma skin cancer 08/2015   right leg  . Osteopenia   . Osteoporosis   . Post-menopausal      Past Surgical History:  Procedure Laterality Date  . FOOT SURGERY Right 06/2013   right 2nd toe  . HEMATOMA EVACUATION Left 2009   left calf     Current Outpatient Prescriptions  Medication Sig Dispense Refill  . albuterol (PROAIR HFA) 108 (90 BASE) MCG/ACT inhaler Inhale 2 puffs into the lungs every 6 (six) hours as needed for wheezing or shortness of breath.    Marland Kitchen aspirin 81 MG tablet Take 81 mg by mouth daily.    Marland Kitchen atorvastatin (LIPITOR) 20 MG tablet Take 1 tablet by mouth at bedtime.  11  . betamethasone valerate ointment (VALISONE) 0.1 % Apply a pea sized amount 3 x a week 30 g 3  . betamethasone valerate ointment (VALISONE) 0.1 % Apply 1 application topically 2 (two) times daily. For 1-2 weeks as needed 30 g 0  . budesonide-formoterol (SYMBICORT) 160-4.5 MCG/ACT inhaler Inhale 2 puffs into the lungs every morning.    . calcium carbonate (OS-CAL) 600 MG TABS tablet Take 600 mg by mouth 2 (two) times daily with a meal.    . Cholecalciferol (VITAMIN D-3) 1000 UNITS CAPS Take by mouth daily.    . Coenzyme Q10 (COQ-10 PO) Take by mouth.    . irbesartan (AVAPRO) 150 MG tablet Take 150 mg by mouth daily.    Marland Kitchen latanoprost (XALATAN) 0.005 % ophthalmic solution Place 1 drop into both eyes at bedtime.    . Methen-Hyosc-Meth Blue-Na Phos (UROGESIC-BLUE) 81.6 MG TABS Take  1 tablet by mouth as needed.    . Multiple Vitamins-Minerals (CENTRUM SILVER ADULT 50+ PO) Take by mouth daily.    . Multiple Vitamins-Minerals (ICAPS MV PO) Take 1 tablet by mouth daily.    Marland Kitchen nystatin cream (MYCOSTATIN) APPLY EXTERNALLY TO THE AFFECTED AREA TWICE DAILY FOR UP TO 14 DAYS 30 g 4  . nystatin ointment (MYCOSTATIN) Apply 1 application topically 2 (two) times daily. Apply to affected area for up to 7 days. 30 g 0  . Omega-3 Fatty Acids (FISH OIL) 1000 MG CAPS Take 1 capsule by mouth daily.    . Psyllium (METAMUCIL PO) Take by mouth.    . ranitidine (ZANTAC) 150 MG tablet Take 150 mg by mouth as needed for heartburn.    . vitamin B-12 (CYANOCOBALAMIN) 250 MCG tablet Take 250 mcg by mouth daily.    . vitamin C (ASCORBIC ACID) 500 MG tablet Take 500 mg by mouth  2 (two) times daily.    . vitamin E 400 UNIT capsule Take 400 Units by mouth daily.     No current facility-administered medications for this visit.      ALLERGIES: Contrast media [iodinated diagnostic agents]; Ether; and Iohexol  Family History  Problem Relation Age of Onset  . Breast cancer Mother        deceased -81's  . Heart attack Father   . Heart attack Brother     Social History   Social History  . Marital status: Widowed    Spouse name: N/A  . Number of children: N/A  . Years of education: N/A   Occupational History  . Not on file.   Social History Main Topics  . Smoking status: Never Smoker  . Smokeless tobacco: Never Used  . Alcohol use Not on file  . Drug use: No  . Sexual activity: Yes    Birth control/ protection: Post-menopausal   Other Topics Concern  . Not on file   Social History Narrative  . No narrative on file    Review of Systems  Constitutional: Negative.   HENT: Negative.   Eyes: Negative.   Respiratory: Negative.   Cardiovascular: Negative.   Gastrointestinal: Negative.   Genitourinary:       Vaginal irritation and itching   Musculoskeletal: Negative.   Skin: Negative.   Neurological: Negative.   Endo/Heme/Allergies: Negative.   Psychiatric/Behavioral: Negative.     PHYSICAL EXAMINATION:    BP 122/60 (BP Location: Right Arm, Patient Position: Sitting, Cuff Size: Normal)   Pulse 68   Resp 14   Wt 200 lb (90.7 kg)   LMP 08/14/1985 (Approximate)   BMI 33.28 kg/m     General appearance: alert, cooperative and appears stated age  Pelvic: External genitalia:  no lesions, erythema has resolved. She has mild agglutination of the labia minora to labia majora, mild whitening.               Urethra:  normal appearing urethra with no masses, tenderness or lesions              Bartholins and Skenes: normal                  Chaperone was present for exam.  ASSESSMENT Vulvitis resolved with nystatin and steroid ointment Lichen  sclerosis is stable with 2 x a week steroid ointment Depression, worried about her only son and his deep depression    PLAN Continue with the steroid ointment 2 x a week Call with worsening vulvar symptoms  Will try and get the patient in to see a therapist Recommended she encourage her son to seek help If she thinks her son is a threat to others she needs to notify the police She is planning on speaking with her sons internist She will call if she wants to try medication for her depression Crisis hotline # given   An After Visit Summary was printed and given to the patient.  Over 30 minutes face to face time of which over 50% was spent in counseling.

## 2017-02-08 NOTE — Telephone Encounter (Signed)
Left message to call Abdulahad Mederos at [phone].  

## 2017-02-08 NOTE — Telephone Encounter (Signed)
Per Dr. Talbert Nan, patient needs to see Counselor ASAP, needs to schedule with provider that accepts medicare.  Call to [facility], was advised does not take [place]. Patient needs to call to schedule appointment, scheduling 4 weeks out. If emergency, advised to go to ER.

## 2017-02-08 NOTE — Patient Instructions (Signed)
Major Depressive Disorder, Adult Major depressive disorder (MDD) is a mental health condition. It may also be called clinical depression or unipolar depression. MDD usually causes feelings of sadness, hopelessness, or helplessness. MDD can also cause physical symptoms. It can interfere with work, school, relationships, and other everyday activities. MDD may be mild, moderate, or severe. It may occur once (single episode major depressive disorder) or it may occur multiple times (recurrent major depressive disorder). What are the causes? The exact cause of this condition is not known. MDD is most likely caused by a combination of things, which may include:  Genetic factors. These are traits that are passed along from parent to child.  Individual factors. Your personality, your behavior, and the way you handle your thoughts and feelings may contribute to MDD. This includes personality traits and behaviors learned from others.  Physical factors, such as: ? Differences in the part of your brain that controls emotion. This part of your brain may be different than it is in people who do not have MDD. ? Long-term (chronic) medical or psychiatric illnesses.  Social factors. Traumatic experiences or major life changes may play a role in the development of MDD.  What increases the risk? This condition is more likely to develop in women. The following factors may also make you more likely to develop MDD:  A family history of depression.  Troubled family relationships.  Abnormally low levels of certain brain chemicals.  Traumatic events in childhood, especially abuse or the loss of a parent.  Being under a lot of stress, or long-term stress, especially from upsetting life experiences or losses.  A history of: ? Chronic physical illness. ? Other mental health disorders. ? Substance abuse.  Poor living conditions.  Experiencing social exclusion or discrimination on a regular basis.  What are  the signs or symptoms? The main symptoms of MDD typically include:  Constant depressed or irritable mood.  Loss of interest in things and activities.  MDD symptoms may also include:  Sleeping or eating too much or too little.  Unexplained weight change.  Fatigue or low energy.  Feelings of worthlessness or guilt.  Difficulty thinking clearly or making decisions.  Thoughts of suicide or of harming others.  Physical agitation or weakness.  Isolation.  Severe cases of MDD may also occur with other symptoms, such as:  Delusions or hallucinations, in which you imagine things that are not real (psychotic depression).  Low-level depression that lasts at least a year (chronic depression or persistent depressive disorder).  Extreme sadness and hopelessness (melancholic depression).  Trouble speaking and moving (catatonic depression).  How is this diagnosed? This condition may be diagnosed based on:  Your symptoms.  Your medical history, including your mental health history. This may involve tests to evaluate your mental health. You may be asked questions about your lifestyle, including any drug and alcohol use, and how long you have had symptoms of MDD.  A physical exam.  Blood tests to rule out other conditions.  You must have a depressed mood and at least four other MDD symptoms most of the day, nearly every day in the same 2-week timeframe before your health care provider can confirm a diagnosis of MDD. How is this treated? This condition is usually treated by mental health professionals, such as psychologists, psychiatrists, and clinical social workers. You may need more than one type of treatment. Treatment may include:  Psychotherapy. This is also called talk therapy or counseling. Types of psychotherapy include: ? Cognitive behavioral   therapy (CBT). This type of therapy teaches you to recognize unhealthy feelings, thoughts, and behaviors, and replace them with  positive thoughts and actions. ? Interpersonal therapy (IPT). This helps you to improve the way you relate to and communicate with others. ? Family therapy. This treatment includes members of your family.  Medicine to treat anxiety and depression, or to help you control certain emotions and behaviors.  Lifestyle changes, such as: ? Limiting alcohol and drug use. ? Exercising regularly. ? Getting plenty of sleep. ? Making healthy eating choices. ? Spending more time outdoors.  Treatments involving stimulation of the brain can be used in situations with extremely severe symptoms, or when medicine or other therapies do not work over time. These treatments include electroconvulsive therapy, transcranial magnetic stimulation, and vagal nerve stimulation. Follow these instructions at home: Activity  Return to your normal activities as told by your health care provider.  Exercise regularly and spend time outdoors as told by your health care provider. General instructions  Take over-the-counter and prescription medicines only as told by your health care provider.  Do not drink alcohol. If you drink alcohol, limit your alcohol intake to no more than 1 drink a day for nonpregnant women and 2 drinks a day for men. One drink equals 12 oz of beer, 5 oz of wine, or 1 oz of hard liquor. Alcohol can affect any antidepressant medicines you are taking. Talk to your health care provider about your alcohol use.  Eat a healthy diet and get plenty of sleep.  Find activities that you enjoy doing, and make time to do them.  Consider joining a support group. Your health care provider may be able to recommend a support group.  Keep all follow-up visits as told by your health care provider. This is important. Where to find more information: National Alliance on Mental Illness  www.nami.org  U.S. National Institute of Mental Health  www.nimh.nih.gov  National Suicide Prevention  Lifeline  1-800-273-TALK (8255). This is free, 24-hour help.  Contact a health care provider if:  Your symptoms get worse.  You develop new symptoms. Get help right away if:  You self-harm.  You have serious thoughts about hurting yourself or others.  You see, hear, taste, smell, or feel things that are not present (hallucinate). This information is not intended to replace advice given to you by your health care provider. Make sure you discuss any questions you have with your health care provider. Document Released: 11/25/2012 Document Revised: 04/06/2016 Document Reviewed: 02/09/2016 Elsevier Interactive Patient Education  2017 Elsevier Inc.  

## 2017-02-09 NOTE — Telephone Encounter (Signed)
Left message to call Simisola Sandles at [phone].  

## 2017-02-13 NOTE — Telephone Encounter (Signed)
Signed letter to [name] to mail "certified".  Routing to provider for final review. Patient is agreeable to disposition. Will close encounter.

## 2017-02-13 NOTE — Telephone Encounter (Signed)
See letter dated 02/13/17 at 10:40 am, to Dr. Talbert Nan for signature.

## 2017-02-13 NOTE — Telephone Encounter (Signed)
Attempted both telephone numbers on file, Left message to call Sharee Pimple at [phone].

## 2017-02-20 ENCOUNTER — Telehealth: Payer: Self-pay | Admitting: *Deleted

## 2017-02-20 NOTE — Telephone Encounter (Signed)
Call forwarded from front office staff. Patient calling with update.   Spoke with patient. Patient apologized for not returning call. Left for vacation the evening of 6/28, traveled to [place] with church group, gone for 10 days.   Reports problem discussed in office has "been helped and feels better", son is seeking help through internist. Patient states she recently learned her church offers a free "Grief Share" that she will start attending next Wednesday. Thanked patient for returning call for update, advised patient would update Dr. Talbert Nan. Advised of recommendations as seen in telephone encounter dated 02/08/17 if alternative to church group is needed. Patient verbalizes understanding and is agreeable.   Routing to provider for final review. Patient is agreeable to disposition. Will close encounter.

## 2017-03-14 ENCOUNTER — Other Ambulatory Visit: Payer: Self-pay | Admitting: Internal Medicine

## 2017-03-14 DIAGNOSIS — Z1231 Encounter for screening mammogram for malignant neoplasm of breast: Secondary | ICD-10-CM

## 2017-04-18 ENCOUNTER — Ambulatory Visit
Admission: RE | Admit: 2017-04-18 | Discharge: 2017-04-18 | Disposition: A | Discharge status: Home / Self Care | Payer: Medicare HMO | Source: Ambulatory Visit | Attending: Internal Medicine | Admitting: Internal Medicine

## 2017-04-18 DIAGNOSIS — Z1231 Encounter for screening mammogram for malignant neoplasm of breast: Secondary | ICD-10-CM

## 2017-04-18 IMAGING — MG 2D DIGITAL SCREENING BILATERAL MAMMOGRAM WITH CAD AND ADJUNCT TO
8 of 12 series · 8 of 28 positions shown · non-contrast
Comparison: Previous exam(s).

CLINICAL DATA: Screening.

EXAM:
2D DIGITAL SCREENING BILATERAL MAMMOGRAM WITH CAD AND ADJUNCT TOMO

[R MLO]
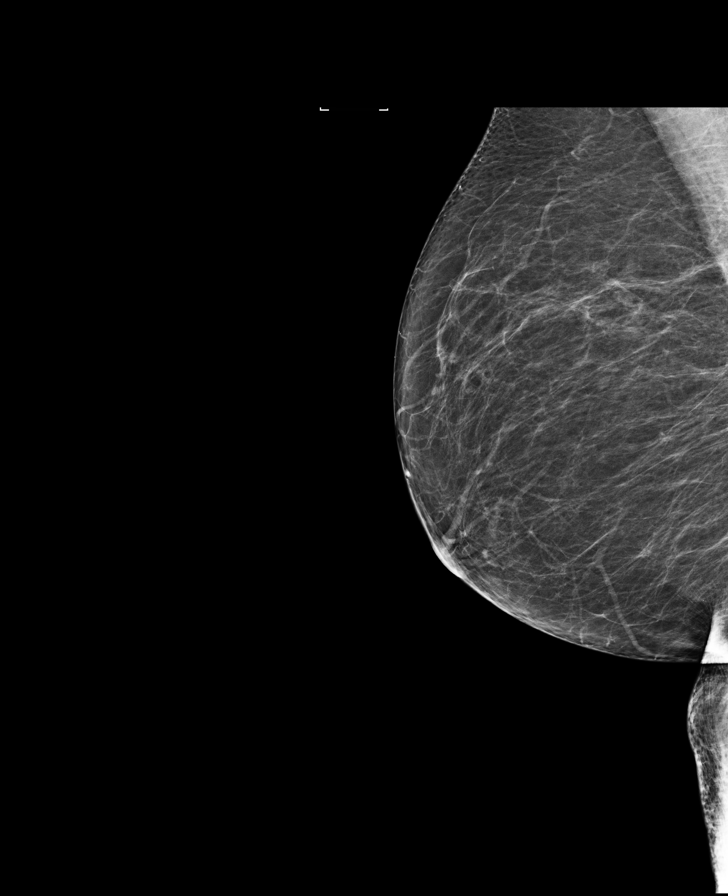

[L MLO synth-2D]
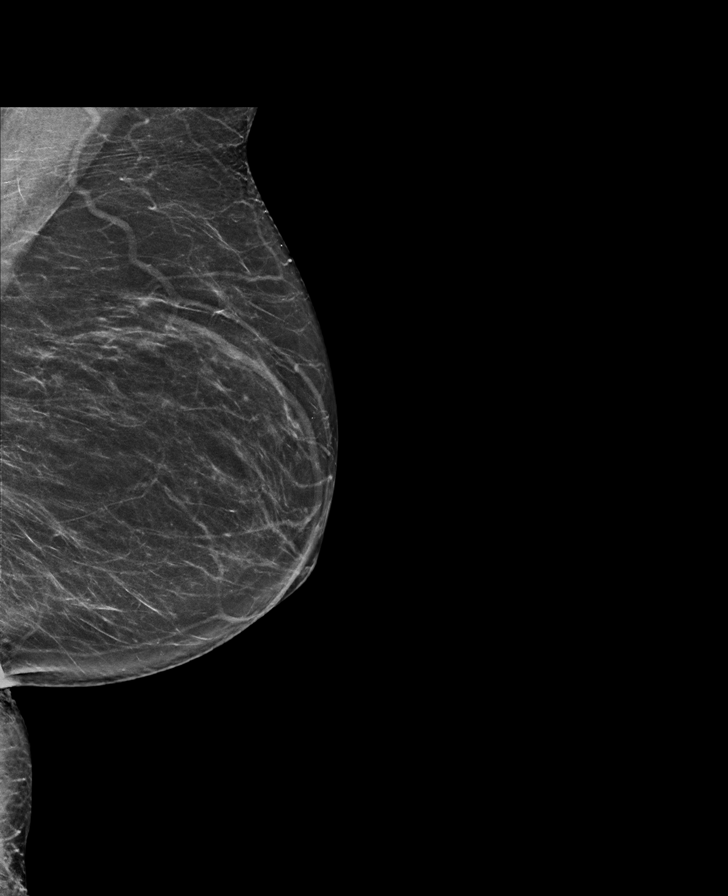

[R MLO synth-2D]
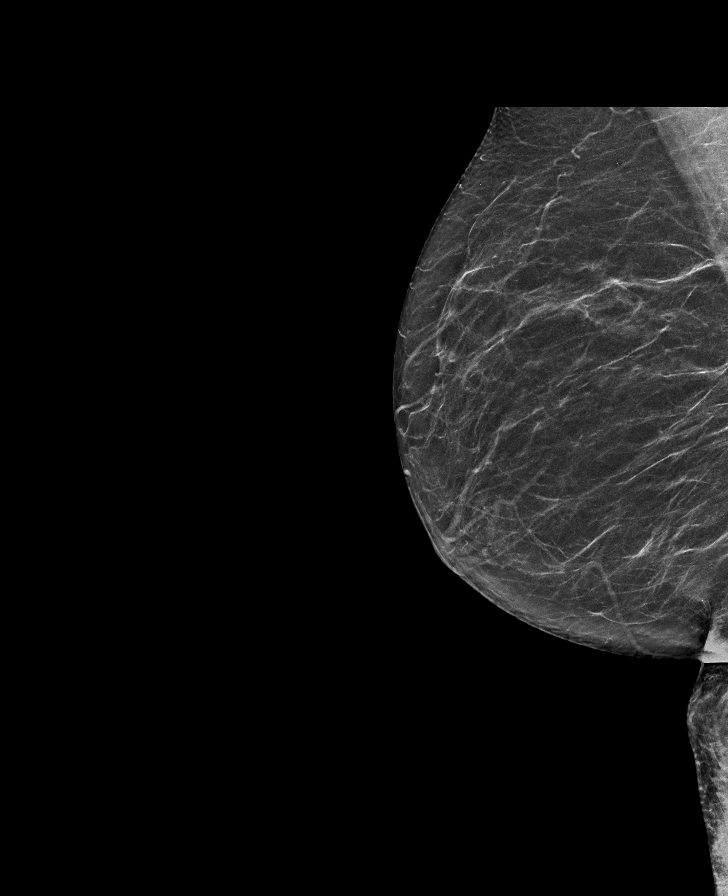

[L CC]
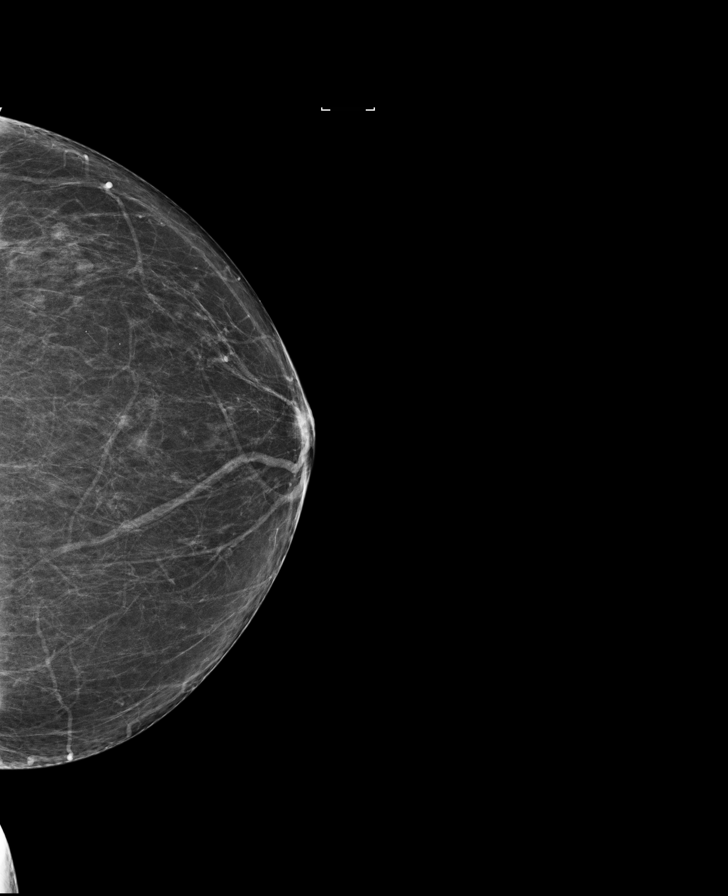

[R CC]
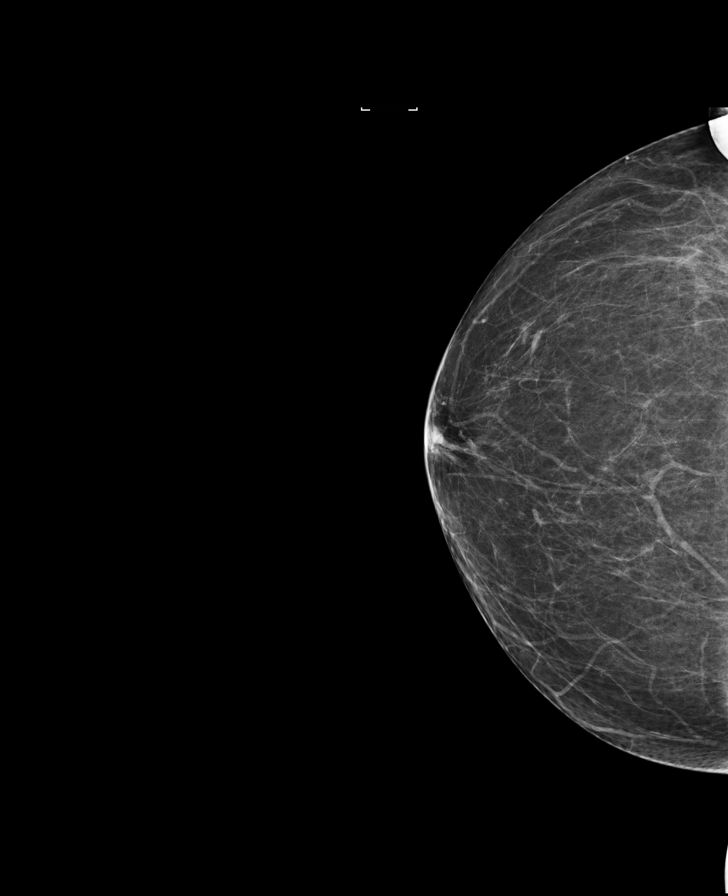

[L MLO]
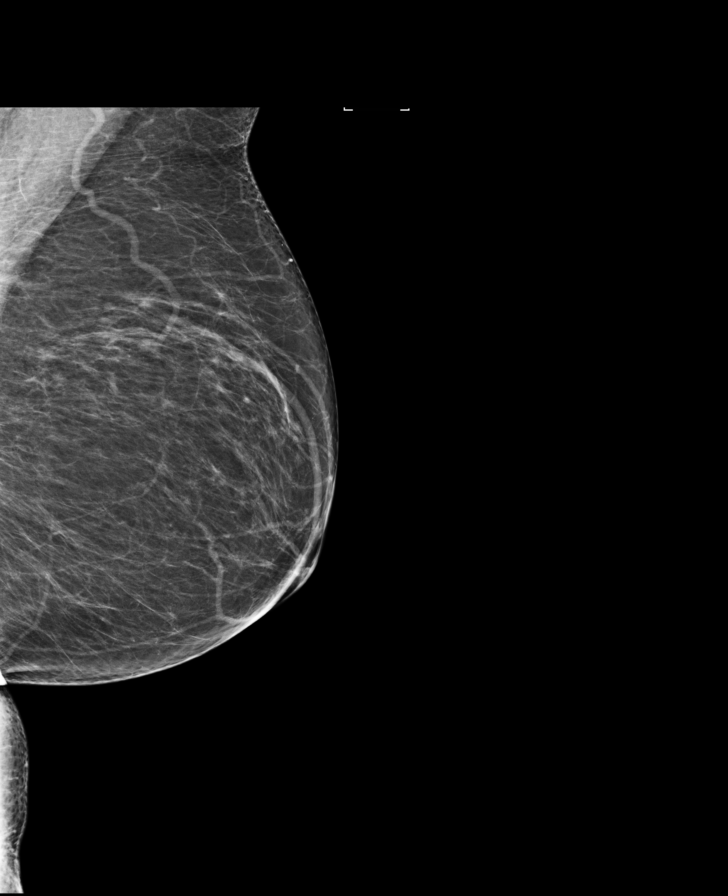

[R CC synth-2D]
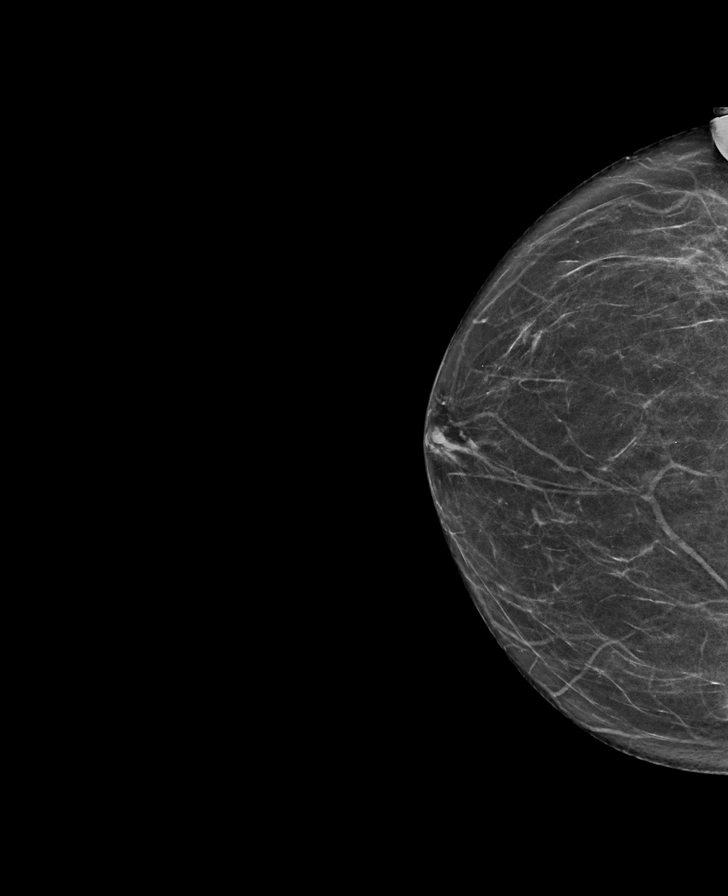

[L CC synth-2D]
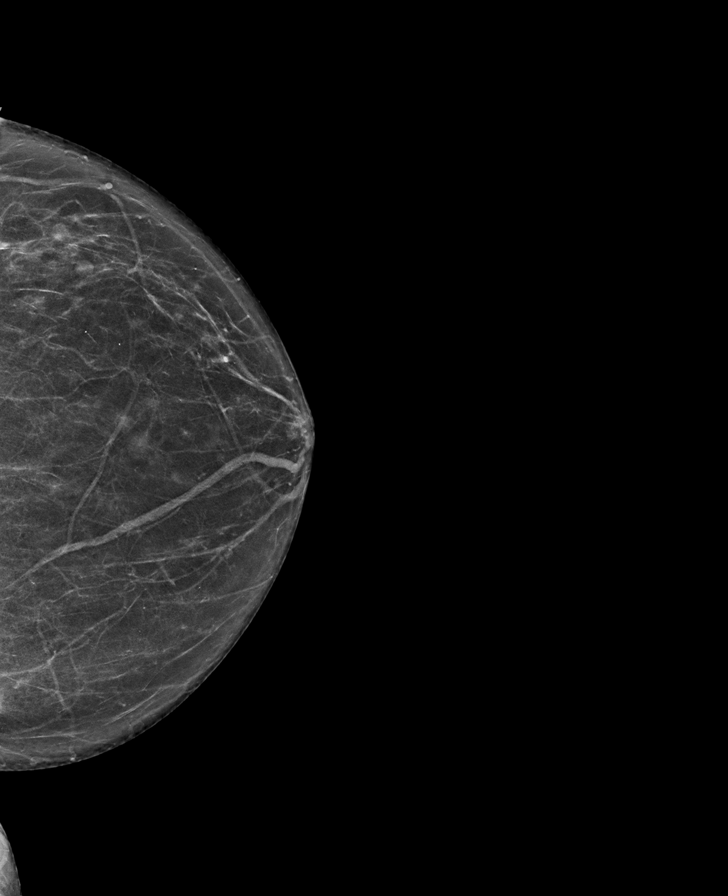

[8 of 28 positions shown; findings below may reference images not displayed]

ACR Breast Density Category b: There are scattered areas of
fibroglandular density.
FINDINGS: There are no findings suspicious for malignancy. Images were
processed with CAD.
IMPRESSION: No mammographic evidence of malignancy. A result letter of this
screening mammogram will be mailed directly to the patient.

RECOMMENDATION:
Screening mammogram in one year. (Code:[33])

BI-RADS CATEGORY  1: Negative.

## 2017-06-20 ENCOUNTER — Other Ambulatory Visit: Payer: Self-pay | Admitting: Obstetrics and Gynecology

## 2017-06-20 NOTE — Telephone Encounter (Signed)
Please check on the patient's symptoms and see how often she is using the nystatin and valisone ointment. I just want to see if she should be seen prior to refilling her medication

## 2017-06-20 NOTE — Telephone Encounter (Signed)
Medication refill request: Nystatin Cream/ Valisone Oint Last OV: 02-08-17  Next AEX: not scheduled Last MMG (if hormonal medication request): 04-18-17 WNL  Refill authorized: please advise

## 2017-06-20 NOTE — Telephone Encounter (Signed)
Left message to call regarding RX refill request -eh

## 2017-06-20 NOTE — Telephone Encounter (Signed)
Patient returned call to Elaine. °

## 2017-06-20 NOTE — Telephone Encounter (Signed)
Spoke with patient and she is only using as needed. She is out of both. She is requesting for both to be refill

## 2017-06-29 ENCOUNTER — Ambulatory Visit (INDEPENDENT_AMBULATORY_CARE_PROVIDER_SITE_OTHER): Payer: Medicare HMO | Admitting: Orthopedic Surgery

## 2017-06-29 ENCOUNTER — Ambulatory Visit (INDEPENDENT_AMBULATORY_CARE_PROVIDER_SITE_OTHER): Payer: Medicare HMO

## 2017-06-29 ENCOUNTER — Encounter (INDEPENDENT_AMBULATORY_CARE_PROVIDER_SITE_OTHER): Payer: Self-pay | Admitting: Orthopedic Surgery

## 2017-06-29 DIAGNOSIS — M79642 Pain in left hand: Secondary | ICD-10-CM

## 2017-06-29 DIAGNOSIS — M1712 Unilateral primary osteoarthritis, left knee: Secondary | ICD-10-CM

## 2017-06-29 MED ORDER — LIDOCAINE HCL 1 % IJ SOLN
5.0000 mL | INTRAMUSCULAR | Status: AC | PRN
  Administered 2017-06-29: 5 mL

## 2017-06-29 MED ORDER — METHYLPREDNISOLONE ACETATE 40 MG/ML IJ SUSP
40.0000 mg | INTRAMUSCULAR | Status: AC | PRN
  Administered 2017-06-29: 40 mg via INTRA_ARTICULAR

## 2017-06-29 MED ORDER — BUPIVACAINE HCL 0.25 % IJ SOLN
4.0000 mL | INTRAMUSCULAR | Status: AC | PRN
  Administered 2017-06-29: 4 mL via INTRA_ARTICULAR

## 2017-06-29 NOTE — Progress Notes (Signed)
Office Visit Note   Patient: [name]           Date of Birth: 1945/12/13           MRN: [id number] Visit Date: 06/29/2017 Requested by: Jilda Panda, MD [id number], [place] [id number] PCP: Jilda Panda, MD  Subjective: Chief Complaint  Patient presents with  . Left Hand - Pain  . Right Knee - Pain    HPI: [name] is a 71 year old patient with known right knee medial compartment arthritis.  She relates that the hinge knee brace.  Denies any history of injury.  Reports pain since an injection wore off 6 months after being performed in March.  She would like to have a repeat injection of the knee today.  She takes Tylenol as needed.  She also reports left hand thumb pain with loss of dexterity and occasional numbness and tingling.  She localizes the pain discretely to the [facility] joint.  This is been going on for about 2 months.  No history of trauma.              ROS: All systems reviewed are negative as they relate to the chief complaint within the history of present illness.  Patient denies  fevers or chills.   Assessment & Plan: Visit Diagnoses:  1. Pain in left hand   2. Unilateral primary osteoarthritis, left knee     Plan: Impression is left hand CMC arthritis with pinch pain and localization of symptoms to [facility] joint.  We'll try her with a brace for that.  In regards to the right knee we will repeat the injection today and replace her hinged knee brace.  She is developing a flexion contracture.  She is likely heading for knee replacement sometime in the future  Follow-Up Instructions: Return if symptoms worsen or fail to improve.   Orders:  Orders Placed This Encounter  Procedures  . XR Hand Complete Left   No orders of the defined types were placed in this encounter.     Procedures: Large Joint Inj: R knee on 06/29/2017 12:40 PM Indications: diagnostic evaluation, joint swelling and pain Details: 18 G 1.5 in needle, superolateral  approach  Arthrogram: No  Medications: 5 mL lidocaine 1 %; 40 mg methylPREDNISolone acetate 40 MG/ML; 4 mL bupivacaine 0.25 % Outcome: tolerated well, no immediate complications Procedure, treatment alternatives, risks and benefits explained, specific risks discussed. Consent was given by the patient. Immediately prior to procedure a time out was called to verify the correct patient, procedure, equipment, support staff and site/side marked as required. Patient was prepped and draped in the usual sterile fashion.       Clinical Data: No additional findings.  Objective: Vital Signs: LMP 08/14/1985 (Approximate)   Physical Exam:   Constitutional: Patient appears well-developed HEENT:  Head: Normocephalic Eyes:EOM are normal Neck: Normal range of motion Cardiovascular: Normal rate Pulmonary/chest: Effort normal Neurologic: Patient is alert Skin: Skin is warm Psychiatric: Patient has normal mood and affect    Ortho Exam: Orthopedic exam demonstrates antalgic gait to the right with a 8 flexion contracture in the right knee.  Extensor mechanism is intact.  Pedal pulses palpable.  Collaterals are stable.  Cruciate stable on the right knee.  No effusion is present.  No groin pain with internal rotation of the leg.  Examination of the left hand demonstrate positive grind test for [facility] arthritis.  Good wrist range of motion dorsiflexion palmar flexion with no snuffbox tenderness and no  tenderness over the first dorsal compartment  Specialty Comments:  No specialty comments available.  Imaging: Xr Hand Complete Left  Result Date: 06/29/2017 AP lateral left hand reviewed.  CMC arthritis and subluxation of that joint is noted.  No other fractures or dislocations present.  No significant degenerative changes in the radiocarpal joint    PMFS History: Patient Active Problem List   Diagnosis Date Noted  . Status post foot surgery 06/27/2013  . Other hammer toe (acquired) 06/10/2013   . Pain in foot 06/10/2013  . Onychomycosis 06/10/2013   Past Medical History:  Diagnosis Date  . COPD (chronic obstructive pulmonary disease) ([place])   . Glaucoma   . History of recurrent UTIs   . Hyperlipidemia   . Hypertension   . Lichen sclerosus January 2017   vulva/perianal region  . Non-melanoma skin cancer 08/2015   right leg  . Osteopenia   . Osteoporosis   . Post-menopausal      Family History  Problem Relation Age of Onset  . Breast cancer Mother        deceased -75's  . Heart attack Father   . Heart attack Brother     Past Surgical History:  Procedure Laterality Date  . FOOT SURGERY Right 06/2013   right 2nd toe  . HEMATOMA EVACUATION Left 2009   left calf   Social History   Occupational History  . Not on file  Tobacco Use  . Smoking status: Never Smoker  . Smokeless tobacco: Never Used  Substance and Sexual Activity  . Alcohol use: Not on file  . Drug use: No  . Sexual activity: Yes    Birth control/protection: Post-menopausal

## 2017-11-19 ENCOUNTER — Encounter: Payer: Self-pay | Admitting: Obstetrics and Gynecology

## 2017-11-19 ENCOUNTER — Telehealth: Payer: Self-pay | Admitting: Obstetrics and Gynecology

## 2017-11-19 ENCOUNTER — Ambulatory Visit (INDEPENDENT_AMBULATORY_CARE_PROVIDER_SITE_OTHER): Payer: Medicare HMO | Admitting: Obstetrics and Gynecology

## 2017-11-19 ENCOUNTER — Other Ambulatory Visit: Payer: Self-pay

## 2017-11-19 VITALS — BP 142/80 | HR 60 | Resp 14 | Wt 211.0 lb

## 2017-11-19 DIAGNOSIS — N3946 Mixed incontinence: Secondary | ICD-10-CM | POA: Diagnosis not present

## 2017-11-19 DIAGNOSIS — B9689 Other specified bacterial agents as the cause of diseases classified elsewhere: Secondary | ICD-10-CM

## 2017-11-19 DIAGNOSIS — N760 Acute vaginitis: Secondary | ICD-10-CM

## 2017-11-19 MED ORDER — METRONIDAZOLE 500 MG PO TABS: 500.0000 mg | ORAL_TABLET | Freq: Two times a day (BID) | ORAL | 0 refills | Status: DC

## 2017-11-19 MED ORDER — CLOBETASOL PROPIONATE 0.05 % EX OINT: 1.0000 "application " | TOPICAL_OINTMENT | Freq: Two times a day (BID) | CUTANEOUS | 0 refills | Status: DC

## 2017-11-19 NOTE — Progress Notes (Signed)
GYNECOLOGY  VISIT   HPI: 72 y.o.   Widowed  Caucasian  female   [phone] with Patient's last menstrual period was 08/14/1985 (approximate).   here c/o severe vaginal irritation, bleeding (from the vulva) and itching. She has a h/o lichen sclerosis and severe vulvitis in the past.      She has a steroid ointment has been using it 2 x a week. Has used it 3 x in the last week. Hurts to sit. The itching isn't as bad as the irritation.  She c/o urinary incontinence, needs to wear a pad. She has mixed incontinence and just random leakage. It has gotten worse recently.   GYNECOLOGIC HISTORY: Patient's last menstrual period was 08/14/1985 (approximate). Contraception:postmenopause Menopausal hormone therapy: none         OB History    Gravida  6   Para  1   Term  1   Preterm  0   AB  5   Living  1     SAB  5   TAB  0   Ectopic  0   Multiple  0   Live Births  1              Patient Active Problem List   Diagnosis Date Noted  . Status post foot surgery 06/27/2013  . Other hammer toe (acquired) 06/10/2013  . Pain in foot 06/10/2013  . Onychomycosis 06/10/2013    Past Medical History:  Diagnosis Date  . COPD (chronic obstructive pulmonary disease) ([place])   . Glaucoma   . History of recurrent UTIs   . Hyperlipidemia   . Hypertension   . Lichen sclerosus January 2017   vulva/perianal region  . Non-melanoma skin cancer 08/2015   right leg  . Osteopenia   . Osteoporosis   . Post-menopausal      Past Surgical History:  Procedure Laterality Date  . FOOT SURGERY Right 06/2013   right 2nd toe  . HEMATOMA EVACUATION Left 2009   left calf    Current Outpatient Medications  Medication Sig Dispense Refill  . albuterol (PROAIR HFA) 108 (90 BASE) MCG/ACT inhaler Inhale 2 puffs into the lungs every 6 (six) hours as needed for wheezing or shortness of breath.    Marland Kitchen aspirin 81 MG tablet Take 81 mg by mouth daily.    Marland Kitchen atorvastatin (LIPITOR) 20 MG tablet Take 1 tablet by  mouth at bedtime.  11  . betamethasone valerate ointment (VALISONE) 0.1 % Apply a pea sized amount 3 x a week 30 g 3  . betamethasone valerate ointment (VALISONE) 0.1 % APPLY TO AFFECTED AREA TWICE A DAY FOR 1 TO 2 WEEKS AS NEEDED 30 g 0  . budesonide-formoterol (SYMBICORT) 160-4.5 MCG/ACT inhaler Inhale 2 puffs into the lungs every morning.    . calcium carbonate (OS-CAL) 600 MG TABS tablet Take 600 mg by mouth 2 (two) times daily with a meal.    . Cholecalciferol (VITAMIN D-3) 1000 UNITS CAPS Take by mouth daily.    . Coenzyme Q10 (COQ-10 PO) Take by mouth.    . irbesartan (AVAPRO) 150 MG tablet Take 150 mg by mouth daily.    Marland Kitchen latanoprost (XALATAN) 0.005 % ophthalmic solution Place 1 drop into both eyes at bedtime.    . Methen-Hyosc-Meth Blue-Na Phos (UROGESIC-BLUE) 81.6 MG TABS Take 1 tablet by mouth as needed.    . Multiple Vitamins-Minerals (CENTRUM SILVER ADULT 50+ PO) Take by mouth daily.    . Multiple Vitamins-Minerals (ICAPS MV PO) Take  1 tablet by mouth daily.    Marland Kitchen nystatin cream (MYCOSTATIN) APPLY EXTERNALLY TO THE AFFECTED AREA TWICE DAILY FOR UP TO 14 DAYS 30 g 4  . nystatin ointment (MYCOSTATIN) APPLY TO AFFECTED AREA TWICE A DAY FOR UP TO 7 DAYS 30 g 0  . Omega-3 Fatty Acids (FISH OIL) 1000 MG CAPS Take 1 capsule by mouth daily.    . Psyllium (METAMUCIL PO) Take by mouth.    . ranitidine (ZANTAC) 150 MG tablet Take 150 mg by mouth as needed for heartburn.    . vitamin B-12 (CYANOCOBALAMIN) 250 MCG tablet Take 250 mcg by mouth daily.    . vitamin C (ASCORBIC ACID) 500 MG tablet Take 500 mg by mouth 2 (two) times daily.    . vitamin E 400 UNIT capsule Take 400 Units by mouth daily.     No current facility-administered medications for this visit.      ALLERGIES: Contrast media [iodinated diagnostic agents]; Ether; and Iohexol  Family History  Problem Relation Age of Onset  . Breast cancer Mother        deceased -64's  . Heart attack Father   . Heart attack Brother      Social History   Socioeconomic History  . Marital status: Widowed    Spouse name: Not on file  . Number of children: Not on file  . Years of education: Not on file  . Highest education level: Not on file  Occupational History  . Not on file  Social Needs  . Financial resource strain: Not on file  . Food insecurity:    Worry: Not on file    Inability: Not on file  . Transportation needs:    Medical: Not on file    Non-medical: Not on file  Tobacco Use  . Smoking status: Never Smoker  . Smokeless tobacco: Never Used  Substance and Sexual Activity  . Alcohol use: Not on file  . Drug use: No  . Sexual activity: Yes    Birth control/protection: Post-menopausal  Lifestyle  . Physical activity:    Days per week: Not on file    Minutes per session: Not on file  . Stress: Not on file  Relationships  . Social connections:    Talks on phone: Not on file    Gets together: Not on file    Attends religious service: Not on file    Active member of club or organization: Not on file    Attends meetings of clubs or organizations: Not on file    Relationship status: Not on file  . Intimate partner violence:    Fear of current or ex partner: Not on file    Emotionally abused: Not on file    Physically abused: Not on file    Forced sexual activity: Not on file  Other Topics Concern  . Not on file  Social History Narrative  . Not on file    Review of Systems  Constitutional: Negative.   HENT: Negative.   Eyes:       Itchy eyes  Respiratory: Negative.   Cardiovascular: Negative.   Gastrointestinal: Negative.   Genitourinary:       Vaginal bleeding and itching  Loss of urine with sneeze/cough Night urination   Musculoskeletal: Negative.   Skin: Negative.   Neurological: Negative.   Endo/Heme/Allergies: Negative.   Psychiatric/Behavioral: Negative.     PHYSICAL EXAMINATION:    BP (!) 142/80 (BP Location: Right Arm, Patient Position: Sitting, Cuff Size: Normal)  Pulse 60   Resp 14   Wt 211 lb (95.7 kg)   LMP 08/14/1985 (Approximate)   BMI 35.11 kg/m     General appearance: alert, cooperative and appears stated age  Pelvic: External genitalia:  Whitening, erythema, friable in areas. Agglutination of the labia minora to majora.               Urethra:  normal appearing urethra with no masses, tenderness or lesions              Bartholins and Skenes: normal                 Vagina: normal appearing vagina with normal color and discharge, no lesions              Cervix:no lesions Perianal region with whitening  Chaperone was present for exam.  Wet prep: + clue, no trich, + wbc KOH: no yeast PH: 5.5   ASSESSMENT Severe vulvitis, BV on the slides. Suspect her urinary leakage is contributing to her irritation Urinary incontinence    PLAN Treat for BV Send affirm to r/o yeast Send urine for ua, c&s High dose steroid x 2 weeks Flagyl x 1 week F/U in 2 weeks Given vulvar skin care information.     An After Visit Summary was printed and given to the patient.

## 2017-11-19 NOTE — Telephone Encounter (Signed)
Patient says she is having symptoms like she had before with bleeding and pain.

## 2017-11-19 NOTE — Telephone Encounter (Signed)
Spoke with patient. Patient states that on 4/4 she noticed some blood with wiping. This has been ongoing. Denies any heavy bleeding or pain. Reports vaginal area is swollen and there is a "blue knot" at the vaginal opening. Advised will need to be seen in the office for further evaluation. Appointment scheduled for today at 11 am with Dr.Jertson. Patient is agreeable.  Routing to provider for final review. Patient agreeable to disposition. Will close encounter.

## 2017-11-19 NOTE — Patient Instructions (Signed)
Bacterial Vaginosis Bacterial vaginosis is a vaginal infection that occurs when the normal balance of bacteria in the vagina is disrupted. It results from an overgrowth of certain bacteria. This is the most common vaginal infection among women ages 15-44. Because bacterial vaginosis increases your risk for STIs (sexually transmitted infections), getting treated can help reduce your risk for chlamydia, gonorrhea, herpes, and HIV (human immunodeficiency virus). Treatment is also important for preventing complications in pregnant women, because this condition can cause an early (premature) delivery. What are the causes? This condition is caused by an increase in harmful bacteria that are normally present in small amounts in the vagina. However, the reason that the condition develops is not fully understood. What increases the risk? The following factors may make you more likely to develop this condition:  Having a new sexual partner or multiple sexual partners.  Having unprotected sex.  Douching.  Having an intrauterine device (IUD).  Smoking.  Drug and alcohol abuse.  Taking certain antibiotic medicines.  Being pregnant.  You cannot get bacterial vaginosis from toilet seats, bedding, swimming pools, or contact with objects around you. What are the signs or symptoms? Symptoms of this condition include:  Grey or white vaginal discharge. The discharge can also be watery or foamy.  A fish-like odor with discharge, especially after sexual intercourse or during menstruation.  Itching in and around the vagina.  Burning or pain with urination.  Some women with bacterial vaginosis have no signs or symptoms. How is this diagnosed? This condition is diagnosed based on:  Your medical history.  A physical exam of the vagina.  Testing a sample of vaginal fluid under a microscope to look for a large amount of bad bacteria or abnormal cells. Your health care provider may use a cotton swab  or a small wooden spatula to collect the sample.  How is this treated? This condition is treated with antibiotics. These may be given as a pill, a vaginal cream, or a medicine that is put into the vagina (suppository). If the condition comes back after treatment, a second round of antibiotics may be needed. Follow these instructions at home: Medicines  Take over-the-counter and prescription medicines only as told by your health care provider.  Take or use your antibiotic as told by your health care provider. Do not stop taking or using the antibiotic even if you start to feel better. General instructions  If you have a female sexual partner, tell her that you have a vaginal infection. She should see her health care provider and be treated if she has symptoms. If you have a female sexual partner, he does not need treatment.  During treatment: ? Avoid sexual activity until you finish treatment. ? Do not douche. ? Avoid alcohol as directed by your health care provider. ? Avoid breastfeeding as directed by your health care provider.  Drink enough water and fluids to keep your urine clear or pale yellow.  Keep the area around your vagina and rectum clean. ? Wash the area daily with warm water. ? Wipe yourself from front to back after using the toilet.  Keep all follow-up visits as told by your health care provider. This is important. How is this prevented?  Do not douche.  Wash the outside of your vagina with warm water only.  Use protection when having sex. This includes latex condoms and dental dams.  Limit how many sexual partners you have. To help prevent bacterial vaginosis, it is best to have sex with just   one partner (monogamous).  Make sure you and your sexual partner are tested for STIs.  Wear cotton or cotton-lined underwear.  Avoid wearing tight pants and pantyhose, especially during summer.  Limit the amount of alcohol that you drink.  Do not use any products that  contain nicotine or tobacco, such as cigarettes and e-cigarettes. If you need help quitting, ask your health care provider.  Do not use illegal drugs. Where to find more information:  Centers for Disease Control and Prevention: www.cdc.gov/std  American Sexual Health Association (ASHA): www.ashastd.org  U.S. Department of Health and Human Services, Office on Women's Health: www.womenshealth.gov/ or https://www.womenshealth.gov/a-z-topics/bacterial-vaginosis Contact a health care provider if:  Your symptoms do not improve, even after treatment.  You have more discharge or pain when urinating.  You have a fever.  You have pain in your abdomen.  You have pain during sex.  You have vaginal bleeding between periods. Summary  Bacterial vaginosis is a vaginal infection that occurs when the normal balance of bacteria in the vagina is disrupted.  Because bacterial vaginosis increases your risk for STIs (sexually transmitted infections), getting treated can help reduce your risk for chlamydia, gonorrhea, herpes, and HIV (human immunodeficiency virus). Treatment is also important for preventing complications in pregnant women, because the condition can cause an early (premature) delivery.  This condition is treated with antibiotic medicines. These may be given as a pill, a vaginal cream, or a medicine that is put into the vagina (suppository). This information is not intended to replace advice given to you by your health care provider. Make sure you discuss any questions you have with your health care provider. Document Released: 07/31/2005 Document Revised: 12/04/2016 Document Reviewed: 04/15/2016 Elsevier Interactive Patient Education  2018 Elsevier Inc.  

## 2017-11-20 ENCOUNTER — Telehealth: Payer: Self-pay | Admitting: *Deleted

## 2017-11-20 LAB — URINALYSIS, MICROSCOPIC ONLY
Bacteria, UA: NONE SEEN
CASTS: NONE SEEN /LPF

## 2017-11-20 LAB — VAGINITIS/VAGINOSIS, DNA PROBE
Candida Species: NEGATIVE
Gardnerella vaginalis: NEGATIVE
Trichomonas vaginosis: NEGATIVE

## 2017-11-20 NOTE — Telephone Encounter (Signed)
Left message to call regarding labs -eh

## 2017-11-20 NOTE — Telephone Encounter (Signed)
-----   Message from Salvadore Dom, MD sent at 11/20/2017 10:41 AM EDT ----- Please advise the patient of normal results. Urine culture is pending Please see how she is feeling.

## 2017-11-20 NOTE — Telephone Encounter (Signed)
Spoke with patient and advised of normal results. She is still c/o severe pain when she urinates. -eh

## 2017-11-20 NOTE — Telephone Encounter (Signed)
Patient returned call

## 2017-11-20 NOTE — Telephone Encounter (Signed)
I called the patient, per DPR, I left her a message to void in a sitz bath and to call with any concerns.

## 2017-11-21 ENCOUNTER — Telehealth: Payer: Self-pay | Admitting: *Deleted

## 2017-11-21 LAB — URINE CULTURE

## 2017-11-21 NOTE — Telephone Encounter (Signed)
Left message that urine is taking longer and I will call back when we get the results -eh

## 2017-11-21 NOTE — Telephone Encounter (Signed)
-----   Message from Salvadore Dom, MD sent at 11/21/2017 10:29 AM EDT ----- Please let the patient know that her urine culture is taking a little longer to grow out and see how she is feeling today.

## 2017-11-22 ENCOUNTER — Telehealth: Payer: Self-pay | Admitting: Obstetrics and Gynecology

## 2017-11-22 NOTE — Telephone Encounter (Signed)
Spoke with patient and advised that urine culture  was going to take a little longer. Patient states that she is not feeling good. She says she feels tired and does not feel like working in her yard like she normally does. Patient says she feels like her COPD is causing her problems right now. Advised patient not to work outside right now with the high pollen count. Advised to call her PCP.  Spoke with Dr. Talbert Nan and she advised for patient to call PCP as well. -eh

## 2017-11-22 NOTE — Telephone Encounter (Signed)
Patient is returning call from yesterday

## 2017-11-26 ENCOUNTER — Telehealth: Payer: Self-pay | Admitting: Obstetrics and Gynecology

## 2017-11-26 NOTE — Telephone Encounter (Signed)
Spoke with patient. Patient states that on 11/19/2017 when she was seen in the office with Dr.Jertson and thinks she mentioned an area on her labia that is blue. Asking if Dr.Jertson knows what it is "She never said and I want to make sure its not cancerous." Patient states the area is still present and is swollen. Asking if that is why she was taking Flagyl. Advised will need to review with Dr.Jertson and return call with further information. May need OV for further evaluation.

## 2017-11-26 NOTE — Telephone Encounter (Signed)
Spoke with patient. Advised of message as seen below from [place]. Patient verbalizes understanding. Has an appointment for 4/25 and will keep this as scheduled.   Routing to provider for final review. Patient agreeable to disposition. Will close encounter.

## 2017-11-26 NOTE — Telephone Encounter (Signed)
I thinks she had bruising on the vulva, nothing that looked like cancer. I do want to see her next week to see how she is healing.

## 2017-11-26 NOTE — Telephone Encounter (Signed)
Patient called and requested to speak with the nurse about something she said Dr. Talbert Nan saw at her last visit that was black and blue. She'd like to know what it was.

## 2017-12-06 ENCOUNTER — Other Ambulatory Visit: Payer: Self-pay

## 2017-12-06 ENCOUNTER — Ambulatory Visit (INDEPENDENT_AMBULATORY_CARE_PROVIDER_SITE_OTHER): Payer: Medicare HMO | Admitting: Obstetrics and Gynecology

## 2017-12-06 ENCOUNTER — Encounter: Payer: Self-pay | Admitting: Obstetrics and Gynecology

## 2017-12-06 VITALS — BP 110/70 | HR 88 | Resp 16 | Ht 65.0 in | Wt 208.0 lb

## 2017-12-06 DIAGNOSIS — L900 Lichen sclerosus et atrophicus: Secondary | ICD-10-CM

## 2017-12-06 DIAGNOSIS — N763 Subacute and chronic vulvitis: Secondary | ICD-10-CM

## 2017-12-06 DIAGNOSIS — N3946 Mixed incontinence: Secondary | ICD-10-CM

## 2017-12-06 MED ORDER — OXYBUTYNIN CHLORIDE ER 5 MG PO TB24: 5.0000 mg | ORAL_TABLET | Freq: Every day | ORAL | 1 refills | Status: DC

## 2017-12-06 NOTE — Patient Instructions (Signed)
Urinary Incontinence Urinary incontinence is the involuntary loss of urine from your bladder. What are the causes? There are many causes of urinary incontinence. They include:  Medicines.  Infections.  Prostatic enlargement, leading to overflow of urine from your bladder.  Surgery.  Neurological diseases.  Emotional factors.  What are the signs or symptoms? Urinary Incontinence can be divided into four types: 1. Urge incontinence. Urge incontinence is the involuntary loss of urine before you have the opportunity to go to the bathroom. There is a sudden urge to void but not enough time to reach a bathroom. 2. Stress incontinence. Stress incontinence is the sudden loss of urine with any activity that forces urine to pass. It is commonly caused by anatomical changes to the pelvis and sphincter areas of your body. 3. Overflow incontinence. Overflow incontinence is the loss of urine from an obstructed opening to your bladder. This results in a backup of urine and a resultant buildup of pressure within the bladder. When the pressure within the bladder exceeds the closing pressure of the sphincter, the urine overflows, which causes incontinence, similar to water overflowing a dam. 4. Total incontinence. Total incontinence is the loss of urine as a result of the inability to store urine within your bladder.  How is this diagnosed? Evaluating the cause of incontinence may require:  A thorough and complete medical and obstetric history.  A complete physical exam.  Laboratory tests such as a urine culture and sensitivities.  When additional tests are indicated, they can include:  An ultrasound exam.  Kidney and bladder X-rays.  Cystoscopy. This is an exam of the bladder using a narrow scope.  Urodynamic testing to test the nerve function to the bladder and sphincter areas.  How is this treated? Treatment for urinary incontinence depends on the cause:  For urge incontinence caused  by a bacterial infection, antibiotics will be prescribed. If the urge incontinence is related to medicines you take, your health care provider may have you change the medicine.  For stress incontinence, surgery to re-establish anatomical support to the bladder or sphincter, or both, will often correct the condition.  For overflow incontinence caused by an enlarged prostate, an operation to open the channel through the enlarged prostate will allow the flow of urine out of the bladder. In women with fibroids, a hysterectomy may be recommended.  For total incontinence, surgery on your urinary sphincter may help. An artificial urinary sphincter (an inflatable cuff placed around the urethra) may be required. In women who have developed a hole-like passage between their bladder and vagina (vesicovaginal fistula), surgery to close the fistula often is required.  Follow these instructions at home:  Normal daily hygiene and the use of pads or adult diapers that are changed regularly will help prevent odors and skin damage.  Avoid caffeine. It can overstimulate your bladder.  Use the bathroom regularly. Try about every 2-3 hours to go to the bathroom, even if you do not feel the need to do so. Take time to empty your bladder completely. After urinating, wait a minute. Then try to urinate again.  For causes involving nerve dysfunction, keep a log of the medicines you take and a journal of the times you go to the bathroom. Contact a health care provider if:  You experience worsening of pain instead of improvement in pain after your procedure.  Your incontinence becomes worse instead of better. Get help right away if:  You experience fever or shaking chills.  You are unable to   pass your urine.  You have redness spreading into your groin or down into your thighs. This information is not intended to replace advice given to you by your health care provider. Make sure you discuss any questions you have  with your health care provider. Document Released: 09/07/2004 Document Revised: 03/10/2016 Document Reviewed: 01/07/2013 Elsevier Interactive Patient Education  2018 Elsevier Inc.  

## 2017-12-06 NOTE — Progress Notes (Signed)
GYNECOLOGY  VISIT   HPI: 72 y.o.   Widowed  Caucasian  female   [phone] with Patient's last menstrual period was 08/14/1985 (approximate).   here for follow up vaginal irritation.  She was seen 2 weeks ago with significant vulvitis. She has a h/o lichen sclerosis. She was treated for BV, given high steroid ointment. She stopped using the steroid ointment, is using vaseline. She has urinary incontinence, mixed, urge is worse than the stress. Has some random leakage. Negative urine culture 2 weeks ago.    GYNECOLOGIC HISTORY: Patient's last menstrual period was 08/14/1985 (approximate). Contraception: post menopausal  Menopausal hormone therapy: none        OB History    Gravida  6   Para  1   Term  1   Preterm  0   AB  5   Living  1     SAB  5   TAB  0   Ectopic  0   Multiple  0   Live Births  1              Patient Active Problem List   Diagnosis Date Noted  . Status post foot surgery 06/27/2013  . Other hammer toe (acquired) 06/10/2013  . Pain in foot 06/10/2013  . Onychomycosis 06/10/2013    Past Medical History:  Diagnosis Date  . COPD (chronic obstructive pulmonary disease) ([place])   . Glaucoma   . History of recurrent UTIs   . Hyperlipidemia   . Hypertension   . Lichen sclerosus January 2017   vulva/perianal region  . Non-melanoma skin cancer 08/2015   right leg  . Osteopenia   . Osteoporosis   . Post-menopausal      Past Surgical History:  Procedure Laterality Date  . FOOT SURGERY Right 06/2013   right 2nd toe  . HEMATOMA EVACUATION Left 2009   left calf    Current Outpatient Medications  Medication Sig Dispense Refill  . albuterol (PROAIR HFA) 108 (90 BASE) MCG/ACT inhaler Inhale 2 puffs into the lungs every 6 (six) hours as needed for wheezing or shortness of breath.    Marland Kitchen aspirin 81 MG tablet Take 81 mg by mouth daily.    Marland Kitchen atorvastatin (LIPITOR) 20 MG tablet Take 1 tablet by mouth at bedtime.  11  . betamethasone valerate ointment  (VALISONE) 0.1 % Apply a pea sized amount 3 x a week 30 g 3  . budesonide-formoterol (SYMBICORT) 160-4.5 MCG/ACT inhaler Inhale 2 puffs into the lungs every morning.    . calcium carbonate (OS-CAL) 600 MG TABS tablet Take 600 mg by mouth 2 (two) times daily with a meal.    . Cholecalciferol (VITAMIN D-3) 1000 UNITS CAPS Take by mouth daily.    . clobetasol ointment (TEMOVATE) 9.48 % Apply 1 application topically 2 (two) times daily. Apply a small amount twice daily for 2 weeks 30 g 0  . Coenzyme Q10 (COQ-10 PO) Take by mouth.    . latanoprost (XALATAN) 0.005 % ophthalmic solution Place 1 drop into both eyes at bedtime.    Marland Kitchen losartan (COZAAR) 100 MG tablet     . Multiple Vitamins-Minerals (CENTRUM SILVER ADULT 50+ PO) Take by mouth daily.    . Omega-3 Fatty Acids (FISH OIL) 1000 MG CAPS Take 1 capsule by mouth daily.    . ranitidine (ZANTAC) 150 MG tablet Take 150 mg by mouth as needed for heartburn.    . vitamin E 400 UNIT capsule Take 400 Units by mouth daily.  No current facility-administered medications for this visit.      ALLERGIES: Contrast media [iodinated diagnostic agents]; Ether; and Iohexol  Family History  Problem Relation Age of Onset  . Breast cancer Mother        deceased -11's  . Heart attack Father   . Heart attack Brother     Social History   Socioeconomic History  . Marital status: Widowed    Spouse name: Not on file  . Number of children: Not on file  . Years of education: Not on file  . Highest education level: Not on file  Occupational History  . Not on file  Social Needs  . Financial resource strain: Not on file  . Food insecurity:    Worry: Not on file    Inability: Not on file  . Transportation needs:    Medical: Not on file    Non-medical: Not on file  Tobacco Use  . Smoking status: Never Smoker  . Smokeless tobacco: Never Used  Substance and Sexual Activity  . Alcohol use: Not on file  . Drug use: No  . Sexual activity: Yes    Birth  control/protection: Post-menopausal  Lifestyle  . Physical activity:    Days per week: Not on file    Minutes per session: Not on file  . Stress: Not on file  Relationships  . Social connections:    Talks on phone: Not on file    Gets together: Not on file    Attends religious service: Not on file    Active member of club or organization: Not on file    Attends meetings of clubs or organizations: Not on file    Relationship status: Not on file  . Intimate partner violence:    Fear of current or ex partner: Not on file    Emotionally abused: Not on file    Physically abused: Not on file    Forced sexual activity: Not on file  Other Topics Concern  . Not on file  Social History Narrative  . Not on file    Review of Systems  Constitutional: Negative.   HENT: Positive for congestion.   Eyes: Negative.   Respiratory: Negative.   Cardiovascular: Negative.   Gastrointestinal: Negative.   Genitourinary:       Loss of urine with sneeze or cough   Musculoskeletal: Positive for myalgias.  Skin: Negative.   Neurological: Positive for headaches.  Endo/Heme/Allergies: Negative.   Psychiatric/Behavioral: Negative.     PHYSICAL EXAMINATION:    BP 110/70 (BP Location: Left Arm, Patient Position: Sitting, Cuff Size: Large)   Pulse 88   Resp 16   Ht 5\' 5"  (1.651 m)   Wt 208 lb (94.3 kg)   LMP 08/14/1985 (Approximate)   BMI 34.61 kg/m     General appearance: alert, cooperative and appears stated age  Pelvic: External genitalia:  Diffuse whitening, erythema and fissures have resolved. Mild agglutination of labia minora to majora. Some blood under the skin on the lower left vulva               Urethra:  normal appearing urethra with no masses, tenderness or lesions              Bartholins and Skenes: normal                 Vagina: atrophic appearing vagina with normal color and discharge, no lesions  Cervix: no lesions               Chaperone was present for  exam.  ASSESSMENT Vulvitis, treated for BV, negative affirm. Treated with steroid ointment.  Urinary incontinence, mixed, negative culture    PLAN Discussed vulvar skin care, she can use the steroids for one more week, one time a day, use Vaseline Change her pad if it is wet Trial of ditropan F/U in one month   An After Visit Summary was printed and given to the patient.  ~15 minutes face to face time of which over 50% was spent in counseling.

## 2017-12-07 ENCOUNTER — Telehealth: Payer: Self-pay | Admitting: Obstetrics and Gynecology

## 2017-12-07 MED ORDER — TROSPIUM CHLORIDE ER 60 MG PO CP24: 60.0000 mg | ORAL_CAPSULE | ORAL | 1 refills | Status: DC

## 2017-12-07 NOTE — Telephone Encounter (Signed)
Left message to call Garrit Marrow at [phone]. 

## 2017-12-07 NOTE — Telephone Encounter (Signed)
Patient reports Oxybutynin (Ditropan XL) is too costly. This is tier 3 per review of patient's formulary. Requesting an alternative.   Per review of patient's formulary Oxybutynin chloride tablets are tier 2 can have 120 per 30 days. Trospium chloride is tier 2 can have 60 per 30 days. Trospium chloride ER is tier 2 and can have 30 per 30 days. Routing to [place] for review.

## 2017-12-07 NOTE — Telephone Encounter (Signed)
Please call in Trospium ER 60 mg q am, #30, 1 refill  Can you also please get a copy of her labs from her primary MD

## 2017-12-07 NOTE — Telephone Encounter (Signed)
Patient calling to speak with nurse about her medication.

## 2017-12-11 NOTE — Telephone Encounter (Signed)
Left message to call [place] at [phone].  Spoke with Dr.Moreira's office. Labs are being faxed to the office.

## 2017-12-14 ENCOUNTER — Encounter: Payer: Self-pay | Admitting: Obstetrics and Gynecology

## 2017-12-18 NOTE — Telephone Encounter (Signed)
Please send a letter that her script has been sent and that we were unable to reach her by phone. Then close the encounter.

## 2017-12-18 NOTE — Telephone Encounter (Signed)
Rx for Trospium 60 mg q am has been sent to patient's pharmacy on file. Oxybutynin rx has been cancelled. Labs from PCP are to be faxed to office. Unable to reach patient about new rx to pharmacy x 2. Okay to close?

## 2017-12-19 NOTE — Telephone Encounter (Signed)
Letter mailed to patient's home address on file. Encounter closed. °

## 2017-12-19 NOTE — Telephone Encounter (Signed)
Letter to Dr.Jertson for review. 

## 2017-12-21 ENCOUNTER — Telehealth: Payer: Self-pay | Admitting: Obstetrics and Gynecology

## 2017-12-21 ENCOUNTER — Other Ambulatory Visit: Payer: Self-pay

## 2017-12-21 ENCOUNTER — Encounter: Payer: Self-pay | Admitting: Obstetrics and Gynecology

## 2017-12-21 ENCOUNTER — Telehealth: Payer: Self-pay | Admitting: *Deleted

## 2017-12-21 ENCOUNTER — Ambulatory Visit (INDEPENDENT_AMBULATORY_CARE_PROVIDER_SITE_OTHER): Payer: Medicare HMO | Admitting: Obstetrics and Gynecology

## 2017-12-21 VITALS — BP 140/80 | HR 80 | Resp 16 | Wt 207.0 lb

## 2017-12-21 DIAGNOSIS — N763 Subacute and chronic vulvitis: Secondary | ICD-10-CM

## 2017-12-21 DIAGNOSIS — N3946 Mixed incontinence: Secondary | ICD-10-CM

## 2017-12-21 DIAGNOSIS — K602 Anal fissure, unspecified: Secondary | ICD-10-CM | POA: Diagnosis not present

## 2017-12-21 DIAGNOSIS — L309 Dermatitis, unspecified: Secondary | ICD-10-CM | POA: Diagnosis not present

## 2017-12-21 MED ORDER — LIDOCAINE 5 % EX OINT: 1.0000 "application " | TOPICAL_OINTMENT | Freq: Four times a day (QID) | CUTANEOUS | 0 refills | Status: DC | PRN

## 2017-12-21 MED ORDER — BETAMETHASONE VALERATE 0.1 % EX OINT
TOPICAL_OINTMENT | CUTANEOUS | 3 refills | Status: DC
Start: 2017-12-21 — End: 2019-05-07

## 2017-12-21 NOTE — Progress Notes (Signed)
GYNECOLOGY  VISIT   HPI: 72 y.o.   Widowed  Caucasian  female   [phone] with Patient's last menstrual period was 08/14/1985 (approximate).   here c/o rectal pain, the pain was mild yesterday, today it is severe. The pain is constant. When she voids, the urine hits the skin on the outside of her rectum and it is so painful. It was very painful to have a BM today. She was constipated earlier this week. She did notice some blood.    H/O chronic vulvitis, has urinary incontinence.   GYNECOLOGIC HISTORY: Patient's last menstrual period was 08/14/1985 (approximate). Contraception:postmenopause  Menopausal hormone therapy: none         OB History    Gravida  6   Para  1   Term  1   Preterm  0   AB  5   Living  1     SAB  5   TAB  0   Ectopic  0   Multiple  0   Live Births  1              Patient Active Problem List   Diagnosis Date Noted  . Status post foot surgery 06/27/2013  . Other hammer toe (acquired) 06/10/2013  . Pain in foot 06/10/2013  . Onychomycosis 06/10/2013    Past Medical History:  Diagnosis Date  . COPD (chronic obstructive pulmonary disease) ([place])   . Glaucoma   . History of recurrent UTIs   . Hyperlipidemia   . Hypertension   . Lichen sclerosus January 2017   vulva/perianal region  . Non-melanoma skin cancer 08/2015   right leg  . Osteopenia   . Osteoporosis   . Post-menopausal      Past Surgical History:  Procedure Laterality Date  . FOOT SURGERY Right 06/2013   right 2nd toe  . HEMATOMA EVACUATION Left 2009   left calf    Current Outpatient Medications  Medication Sig Dispense Refill  . albuterol (PROAIR HFA) 108 (90 BASE) MCG/ACT inhaler Inhale 2 puffs into the lungs every 6 (six) hours as needed for wheezing or shortness of breath.    Marland Kitchen aspirin 81 MG tablet Take 81 mg by mouth daily.    Marland Kitchen atorvastatin (LIPITOR) 20 MG tablet Take 1 tablet by mouth at bedtime.  11  . betamethasone valerate ointment (VALISONE) 0.1 % Apply a pea  sized amount 3 x a week 30 g 3  . budesonide-formoterol (SYMBICORT) 160-4.5 MCG/ACT inhaler Inhale 2 puffs into the lungs every morning.    . calcium carbonate (OS-CAL) 600 MG TABS tablet Take 600 mg by mouth 2 (two) times daily with a meal.    . Cholecalciferol (VITAMIN D-3) 1000 UNITS CAPS Take by mouth daily.    . clindamycin (CLEOCIN) 2 % vaginal cream INSERT ONE APPLICATORFUL BY VAGINAL ROUTE EVERY DAY AT BEDTIME  0  . clobetasol ointment (TEMOVATE) 7.03 % Apply 1 application topically 2 (two) times daily. Apply a small amount twice daily for 2 weeks 30 g 0  . Coenzyme Q10 (COQ-10 PO) Take by mouth.    . latanoprost (XALATAN) 0.005 % ophthalmic solution Place 1 drop into both eyes at bedtime.    Marland Kitchen losartan (COZAAR) 100 MG tablet     . Multiple Vitamins-Minerals (CENTRUM SILVER ADULT 50+ PO) Take by mouth daily.    . Omega-3 Fatty Acids (FISH OIL) 1000 MG CAPS Take 1 capsule by mouth daily.    Marland Kitchen oxybutynin (DITROPAN) 5 MG tablet Take 5  mg by mouth 3 (three) times daily.    . ranitidine (ZANTAC) 150 MG tablet Take 150 mg by mouth as needed for heartburn.    . Trospium Chloride 60 MG CP24 Take 1 capsule (60 mg total) by mouth every morning. 30 each 1  . vitamin E 400 UNIT capsule Take 400 Units by mouth daily.     No current facility-administered medications for this visit.      ALLERGIES: Contrast media [iodinated diagnostic agents]; Ether; and Iohexol  Family History  Problem Relation Age of Onset  . Breast cancer Mother        deceased -55's  . Heart attack Father   . Heart attack Brother     Social History   Socioeconomic History  . Marital status: Widowed    Spouse name: Not on file  . Number of children: Not on file  . Years of education: Not on file  . Highest education level: Not on file  Occupational History  . Not on file  Social Needs  . Financial resource strain: Not on file  . Food insecurity:    Worry: Not on file    Inability: Not on file  . Transportation  needs:    Medical: Not on file    Non-medical: Not on file  Tobacco Use  . Smoking status: Never Smoker  . Smokeless tobacco: Never Used  Substance and Sexual Activity  . Alcohol use: Not on file  . Drug use: No  . Sexual activity: Yes    Birth control/protection: Post-menopausal  Lifestyle  . Physical activity:    Days per week: Not on file    Minutes per session: Not on file  . Stress: Not on file  Relationships  . Social connections:    Talks on phone: Not on file    Gets together: Not on file    Attends religious service: Not on file    Active member of club or organization: Not on file    Attends meetings of clubs or organizations: Not on file    Relationship status: Not on file  . Intimate partner violence:    Fear of current or ex partner: Not on file    Emotionally abused: Not on file    Physically abused: Not on file    Forced sexual activity: Not on file  Other Topics Concern  . Not on file  Social History Narrative  . Not on file    Review of Systems  Constitutional: Negative.   HENT: Negative.   Eyes: Negative.   Respiratory: Negative.   Cardiovascular: Negative.   Gastrointestinal:       Rectal pain Painful BM  Genitourinary: Negative.   Musculoskeletal: Negative.   Skin: Negative.   Neurological: Negative.   Endo/Heme/Allergies: Negative.   Psychiatric/Behavioral: Negative.     PHYSICAL EXAMINATION:    BP 140/80 (BP Location: Right Arm, Patient Position: Sitting, Cuff Size: Normal)   Pulse 80   Resp 16   Wt 207 lb (93.9 kg)   LMP 08/14/1985 (Approximate)   BMI 34.45 kg/m     General appearance: alert, cooperative and appears stated age  Pelvic: External genitalia:  no lesions, diffuse whitening, no excoriations or fissures.               Urethra:  normal appearing urethra with no masses, tenderness or lesions              Bartholins and Skenes: normal  Anus:  Anal fissure, peri-anal dermatitis  Chaperone was present for  exam.  ASSESSMENT Anal fissure Perianal dermatitis Mixed incontinence Chronic vulvitis, currently not symptomatic. Discussed that her urinary leakage and pads are contributing to her vulvar issues     PLAN Lidocaine ointment for rectal pain Steroid ointment for peri-anal dermatitis Metamucil to get her stools the right consistency, if not better by Monday she should call Discussed skin care to the vulva and peri-anal region    An After Visit Summary was printed and given to the patient.  ~15 minutes face to face time of which over 50% was spent in counseling.

## 2017-12-21 NOTE — Telephone Encounter (Signed)
Patient spoke with Thayer Ohm who advised the patient to come to the office now to be worked in with [facility].  Routing to provider for final review. Patient agreeable to disposition. Will close encounter.

## 2017-12-21 NOTE — Telephone Encounter (Signed)
Opened in error.   Will close encounter.  

## 2017-12-21 NOTE — Telephone Encounter (Signed)
Patient left voicemail stating that she was in tremendous amounts of pain. Did not leave any further information.

## 2017-12-21 NOTE — Patient Instructions (Addendum)
Anal Fissure, Adult An anal fissure is a small tear or crack in the skin around the anus. Bleeding from a fissure usually stops on its own within a few minutes. However, bleeding will often occur again with each bowel movement until the crack heals. What are the causes? This condition may be caused by:  Passing large, hard stool (feces).  Frequent diarrhea.  Constipation.  Inflammatory bowel disease (Crohn disease or ulcerative colitis).  Infections.  Anal sex.  What are the signs or symptoms? Symptoms of this condition include:  Bleeding from the rectum.  Small amounts of blood seen on your stool, on toilet paper, or in the toilet after a bowel movement.  Painful bowel movements.  Itching or irritation around the anus.  How is this diagnosed? A health care provider may diagnose this condition by closely examining the anal area. An anal fissure can usually be seen with careful inspection. In some cases, a rectal exam may be performed, or a short tube (anoscope) may be used to examine the anal canal. How is this treated? Treatment for this condition may include:  Taking steps to avoid constipation. This may include making changes to your diet, such as increasing your intake of fiber or fluid.  Taking fiber supplements. These supplements can soften your stool to help make bowel movements easier. Your health care provider may also prescribe a stool softener if your stool is often hard.  Taking sitz baths. This may help to heal the tear.  Using medicated creams or ointments. These may be prescribed to lessen discomfort.  Follow these instructions at home: Eating and drinking  Avoid foods that may be constipating, such as bananas and dairy products.  Drink enough fluid to keep your urine clear or pale yellow.  Maintain a diet that is high in fruits, whole grains, and vegetables. General instructions  Keep the anal area as clean and dry as possible.  Take sitz baths as  told by your health care provider. Do not use soap in the sitz baths.  Take over-the-counter and prescription medicines only as told by your health care provider.  Use creams or ointments only as told by your health care provider.  Keep all follow-up visits as told by your health care provider. This is important. Contact a health care provider if:  You have more bleeding.  You have a fever.  You have diarrhea that is mixed with blood.  You continue to have pain.  Your problem is getting worse rather than better. This information is not intended to replace advice given to you by your health care provider. Make sure you discuss any questions you have with your health care provider. Document Released: 07/31/2005 Document Revised: 12/08/2015 Document Reviewed: 10/26/2014 Elsevier Interactive Patient Education  2018 [employer].   Use metamucil 1-3 x a day as needed.

## 2017-12-29 ENCOUNTER — Other Ambulatory Visit: Payer: Self-pay | Admitting: Obstetrics and Gynecology

## 2017-12-31 ENCOUNTER — Telehealth: Payer: Self-pay | Admitting: *Deleted

## 2017-12-31 NOTE — Telephone Encounter (Signed)
Refill sent.

## 2017-12-31 NOTE — Telephone Encounter (Signed)
Left message to call Logun Colavito at [phone].  

## 2017-12-31 NOTE — Telephone Encounter (Signed)
Patient is returning a call to Jill. °

## 2017-12-31 NOTE — Telephone Encounter (Signed)
Medication refill request: Trospium Chloride Last AEX:  07-28-16  Next OV: 01-08-18  Last MMG (if hormonal medication request): 04-18-17 WNL  Refill authorized: please advise

## 2017-12-31 NOTE — Telephone Encounter (Signed)
Patient called stating that she was traveling today and would return call to [place] this afternoon.

## 2017-12-31 NOTE — Telephone Encounter (Signed)
Spoke with patient. Advised calling to f/u with OV on 12/21/17.   Patient reports she is feeling much better, denies pain.  Was able to take her trip to [facility], returned today. Did not pick up RX lidocaine d/t cost, pharmacist  recommended OTC lidocaine,  "worked well". Will see Dr. Talbert Nan on 5/28 for f/u. Patient thankful.   Routing to provider for final review. Patient is agreeable to disposition. Will close encounter.

## 2018-01-03 NOTE — Progress Notes (Signed)
GYNECOLOGY  VISIT   HPI: 72 y.o.   Widowed  Caucasian  female   [phone] with Patient's last menstrual period was 08/14/1985 (approximate).   here for follow up. Patient states doing much better. The patient was seen 2.5 weeks ago with an anal fissure and a perianal dermatitis. She also has a h/o chronic vulvitis, lichen sclerosis and mixed urinary incontinence.  Last month she was started on medication (Trospium 60 mg) for her urinary incontinence and is here for f/u. It is helping her incontinence. No longer needing regular pads, now just using mini-pads off and on. Her frequency, urgency and leakage are all better. She has been drinking more water. Has had slight constipation.  She has been taking metamucil which is helping her fissure heal. No rectal bleeding any more. Uses Vaseline as needed.   GYNECOLOGIC HISTORY: Patient's last menstrual period was 08/14/1985 (approximate). Contraception:Postmenopausal Menopausal hormone therapy:none        OB History    Gravida  6   Para  1   Term  1   Preterm  0   AB  5   Living  1     SAB  5   TAB  0   Ectopic  0   Multiple  0   Live Births  1              Patient Active Problem List   Diagnosis Date Noted  . Status post foot surgery 06/27/2013  . Other hammer toe (acquired) 06/10/2013  . Pain in foot 06/10/2013  . Onychomycosis 06/10/2013    Past Medical History:  Diagnosis Date  . COPD (chronic obstructive pulmonary disease) ([place])   . Glaucoma   . History of recurrent UTIs   . Hyperlipidemia   . Hypertension   . Lichen sclerosus January 2017   vulva/perianal region  . Non-melanoma skin cancer 08/2015   right leg  . Osteopenia   . Osteoporosis   . Post-menopausal      Past Surgical History:  Procedure Laterality Date  . FOOT SURGERY Right 06/2013   right 2nd toe  . HEMATOMA EVACUATION Left 2009   left calf    Current Outpatient Medications  Medication Sig Dispense Refill  . albuterol (PROAIR HFA)  108 (90 BASE) MCG/ACT inhaler Inhale 2 puffs into the lungs every 6 (six) hours as needed for wheezing or shortness of breath.    Marland Kitchen aspirin 81 MG tablet Take 81 mg by mouth daily.    Marland Kitchen atorvastatin (LIPITOR) 20 MG tablet Take 1 tablet by mouth at bedtime.  11  . betamethasone valerate ointment (VALISONE) 0.1 % Apply a pea sized amount 3 x a week 30 g 3  . budesonide-formoterol (SYMBICORT) 160-4.5 MCG/ACT inhaler Inhale 2 puffs into the lungs every morning.    . calcium carbonate (OS-CAL) 600 MG TABS tablet Take 600 mg by mouth 2 (two) times daily with a meal.    . Cholecalciferol (VITAMIN D-3) 1000 UNITS CAPS Take by mouth daily.    . clindamycin (CLEOCIN) 2 % vaginal cream INSERT ONE APPLICATORFUL BY VAGINAL ROUTE EVERY DAY AT BEDTIME  0  . clobetasol ointment (TEMOVATE) 0.62 % Apply 1 application topically 2 (two) times daily. Apply a small amount twice daily for 2 weeks 30 g 0  . Coenzyme Q10 (COQ-10 PO) Take by mouth.    . latanoprost (XALATAN) 0.005 % ophthalmic solution Place 1 drop into both eyes at bedtime.    . lidocaine (XYLOCAINE) 5 % ointment  Apply 1 application topically 4 (four) times daily as needed. 30 g 0  . losartan (COZAAR) 100 MG tablet     . Multiple Vitamins-Minerals (CENTRUM SILVER ADULT 50+ PO) Take by mouth daily.    . Omega-3 Fatty Acids (FISH OIL) 1000 MG CAPS Take 1 capsule by mouth daily.    Marland Kitchen oxybutynin (DITROPAN) 5 MG tablet Take 5 mg by mouth 3 (three) times daily.    . ranitidine (ZANTAC) 150 MG tablet Take 150 mg by mouth as needed for heartburn.    . Trospium Chloride 60 MG CP24 TAKE ONE CAPSULE BY MOUTH EVERY MORNING 30 capsule 0  . vitamin E 400 UNIT capsule Take 400 Units by mouth daily.     No current facility-administered medications for this visit.      ALLERGIES: Contrast media [iodinated diagnostic agents]; Ether; and Iohexol  Family History  Problem Relation Age of Onset  . Breast cancer Mother        deceased -13's  . Heart attack Father   .  Heart attack Brother     Social History   Socioeconomic History  . Marital status: Widowed    Spouse name: Not on file  . Number of children: Not on file  . Years of education: Not on file  . Highest education level: Not on file  Occupational History  . Not on file  Social Needs  . Financial resource strain: Not on file  . Food insecurity:    Worry: Not on file    Inability: Not on file  . Transportation needs:    Medical: Not on file    Non-medical: Not on file  Tobacco Use  . Smoking status: Never Smoker  . Smokeless tobacco: Never Used  Substance and Sexual Activity  . Alcohol use: Not on file  . Drug use: No  . Sexual activity: Yes    Birth control/protection: Post-menopausal  Lifestyle  . Physical activity:    Days per week: Not on file    Minutes per session: Not on file  . Stress: Not on file  Relationships  . Social connections:    Talks on phone: Not on file    Gets together: Not on file    Attends religious service: Not on file    Active member of club or organization: Not on file    Attends meetings of clubs or organizations: Not on file    Relationship status: Not on file  . Intimate partner violence:    Fear of current or ex partner: Not on file    Emotionally abused: Not on file    Physically abused: Not on file    Forced sexual activity: Not on file  Other Topics Concern  . Not on file  Social History Narrative  . Not on file    Review of Systems  Constitutional: Negative.   HENT: Negative.   Eyes: Negative.   Respiratory: Negative.   Cardiovascular: Negative.   Gastrointestinal: Negative.   Genitourinary: Negative.   Musculoskeletal: Negative.   Skin: Negative.   Neurological: Negative.   Endo/Heme/Allergies: Negative.   Psychiatric/Behavioral: Negative.     PHYSICAL EXAMINATION:    BP 110/64 (BP Location: Right Arm, Patient Position: Sitting, Cuff Size: Large)   Pulse 70   Ht 5\' 5"  (1.651 m)   Wt 207 lb (93.9 kg)   LMP  08/14/1985 (Approximate)   BMI 34.45 kg/m     General appearance: alert, cooperative and appears stated age  Pelvic: External genitalia:  no lesions, diffuse whitening, mild agglutination of the labia minora to majora. Small fissure on the left. No plaques or lesions              Urethra:  normal appearing urethra with no masses, tenderness or lesions              Bartholins and Skenes: normal                 Anus:  Fissure at 12 o'clock  Perianal dermatitis is much improved, now with just mild whitening  Chaperone was present for exam.  ASSESSMENT Mixed incontinence, OAB, much improved on medication Chronic vulvitis, improved Perianal dermatitis improved Anal fissure, improving    PLAN Continue medication Get a copy of lab work from her primary (patient reports normal renal function) Continue the metamucil for the anal fissure Vaseline to the vulvar and peri-anal skin Steroid use on a prn bases F/U as needed   An After Visit Summary was printed and given to the patient.  ~15 minutes face to face time of which over 50% was spent in counseling.

## 2018-01-08 ENCOUNTER — Ambulatory Visit (INDEPENDENT_AMBULATORY_CARE_PROVIDER_SITE_OTHER): Payer: Medicare HMO | Admitting: Obstetrics and Gynecology

## 2018-01-08 ENCOUNTER — Encounter: Payer: Self-pay | Admitting: Obstetrics and Gynecology

## 2018-01-08 VITALS — BP 110/64 | HR 70 | Ht 65.0 in | Wt 207.0 lb

## 2018-01-08 DIAGNOSIS — N3946 Mixed incontinence: Secondary | ICD-10-CM | POA: Diagnosis not present

## 2018-01-08 DIAGNOSIS — N763 Subacute and chronic vulvitis: Secondary | ICD-10-CM

## 2018-01-08 DIAGNOSIS — K602 Anal fissure, unspecified: Secondary | ICD-10-CM

## 2018-01-08 DIAGNOSIS — N3281 Overactive bladder: Secondary | ICD-10-CM

## 2018-01-08 MED ORDER — TROSPIUM CHLORIDE ER 60 MG PO CP24: 1.0000 | ORAL_CAPSULE | Freq: Every morning | ORAL | 3 refills | Status: DC

## 2018-02-18 ENCOUNTER — Ambulatory Visit (INDEPENDENT_AMBULATORY_CARE_PROVIDER_SITE_OTHER): Payer: Medicare HMO | Admitting: Orthopedic Surgery

## 2018-02-18 ENCOUNTER — Encounter (INDEPENDENT_AMBULATORY_CARE_PROVIDER_SITE_OTHER): Payer: Self-pay | Admitting: Orthopedic Surgery

## 2018-02-18 ENCOUNTER — Ambulatory Visit (INDEPENDENT_AMBULATORY_CARE_PROVIDER_SITE_OTHER): Payer: Medicare HMO

## 2018-02-18 DIAGNOSIS — M79671 Pain in right foot: Secondary | ICD-10-CM | POA: Diagnosis not present

## 2018-02-18 NOTE — Progress Notes (Signed)
Office Visit Note   Patient: [name]           Date of Birth: 1946-07-27           MRN: [id number] Visit Date: 02/18/2018 Requested by: Jilda Panda, MD [id number] [place], [place] [id number] PCP: Jilda Panda, MD  Subjective: Chief Complaint  Patient presents with  . Right Foot - Pain    HPI: [name] is a 72 year old patient with right foot pain of one-week duration.  She feels like there is a knot on her right foot.  She has been doing a lot of dancing recently.  Localizes the pain to the fifth metatarsal region.  Takes Tylenol for her symptoms.              ROS: All systems reviewed are negative as they relate to the chief complaint within the history of present illness.  Patient denies  fevers or chills. Impression is pain at the fifth metatarsal at the attachment site of the peroneal tendons.  She may have some bursitis here or could be an early stress reaction.  Radiographs are normal.  I am to give her some topical pen said and have her take 1 Duexis a day  Assessment & Plan: Visit Diagnoses:  1. Pain in right foot     Plan: For 9 days.  Hold off on dancing for 1 week.  If she is not any better in 2 weeks she should come back and we can consider further imaging at that time.  Follow-Up Instructions: Return if symptoms worsen or fail to improve.   Orders:  Orders Placed This Encounter  Procedures  . XR Foot Complete Right   No orders of the defined types were placed in this encounter.     Procedures: No procedures performed   Clinical Data: No additional findings.  Objective: Vital Signs: LMP 08/14/1985 (Approximate)   Physical Exam:   Constitutional: Patient appears well-developed HEENT:  Head: Normocephalic Eyes:EOM are normal Neck: Normal range of motion Cardiovascular: Normal rate Pulmonary/chest: Effort normal Neurologic: Patient is alert Skin: Skin is warm Psychiatric: Patient has normal mood and affect    Ortho Exam: Ortho exam  demonstrates full active and passive range of motion of the foot and ankle.  Does have tenderness at that base of the fifth metatarsal.  No real pain with pronation supination of the foot.  Patient has palpable pedal pulses and intact and nontender anterior to posterior to peroneal and Achilles tendons.  Likely she has tenderness is right at that peroneal tendon attachment at the base of the fifth metatarsal.  No focal or abnormal swelling is present in this region.  Left hip demonstrates no real groin pain with internal and external rotation of the leg.  No nerve root tension signs on that side or the right side.  Specialty Comments:  No specialty comments available.  Imaging: Xr Foot Complete Right  Result Date: 02/18/2018 AP lateral oblique right foot reviewed.  2 screws present in the medial column of the foot without complicating features.  Fifth metatarsal has no abnormal fracture or cortical margins.  No significant degenerative changes within the midfoot.    PMFS History: Patient Active Problem List   Diagnosis Date Noted  . Status post foot surgery 06/27/2013  . Other hammer toe (acquired) 06/10/2013  . Pain in foot 06/10/2013  . Onychomycosis 06/10/2013   Past Medical History:  Diagnosis Date  . COPD (chronic obstructive pulmonary disease) ([place])   . Glaucoma   .  History of recurrent UTIs   . Hyperlipidemia   . Hypertension   . Lichen sclerosus January 2017   vulva/perianal region  . Non-melanoma skin cancer 08/2015   right leg  . Osteopenia   . Osteoporosis   . Post-menopausal      Family History  Problem Relation Age of Onset  . Breast cancer Mother        deceased -60's  . Heart attack Father   . Heart attack Brother     Past Surgical History:  Procedure Laterality Date  . FOOT SURGERY Right 06/2013   right 2nd toe  . HEMATOMA EVACUATION Left 2009   left calf   Social History   Occupational History  . Not on file  Tobacco Use  . Smoking status: Never  Smoker  . Smokeless tobacco: Never Used  Substance and Sexual Activity  . Alcohol use: Not on file  . Drug use: No  . Sexual activity: Yes    Birth control/protection: Post-menopausal

## 2018-03-04 ENCOUNTER — Ambulatory Visit (INDEPENDENT_AMBULATORY_CARE_PROVIDER_SITE_OTHER): Payer: Medicare HMO | Admitting: Orthopedic Surgery

## 2018-03-04 ENCOUNTER — Encounter (INDEPENDENT_AMBULATORY_CARE_PROVIDER_SITE_OTHER): Payer: Self-pay | Admitting: Orthopedic Surgery

## 2018-03-04 DIAGNOSIS — M79671 Pain in right foot: Secondary | ICD-10-CM

## 2018-03-05 ENCOUNTER — Encounter (INDEPENDENT_AMBULATORY_CARE_PROVIDER_SITE_OTHER): Payer: Self-pay | Admitting: Orthopedic Surgery

## 2018-03-05 NOTE — Progress Notes (Signed)
Office Visit Note   Patient: [name]           Date of Birth: 1946-07-09           MRN: [id number] Visit Date: 03/04/2018 Requested by: Jilda Panda, MD [address] [place], [place] [address] PCP: Jilda Panda, MD  Subjective: Chief Complaint  Patient presents with  . Right Foot - Follow-up  . Right Ankle - Follow-up    HPI: [name] is a patient with right foot and ankle pain.  She still hurting but she is some better.  She has not been able to dance because of this pain.  Duexis upset her stomach.  Pen said topical helped her some.  She states that her foot hurts all the time at the base of that fifth metatarsal.              ROS: All systems reviewed are negative as they relate to the chief complaint within the history of present illness.  Patient denies  fevers or chills.   Assessment & Plan: Visit Diagnoses:  1. Pain in right foot     Plan: Impression is right foot pain possible stress reaction versus peroneus brevis tendinitis at the base of the fifth metatarsal.  Plan is MRI scan to evaluate for stress reaction versus tendinitis at the base of fifth metatarsal.  That would potentially change management depending on what is present.  I will see her back after that study  Follow-Up Instructions: Return for after MRI.   Orders:  Orders Placed This Encounter  Procedures  . MR Foot Right w/o contrast   No orders of the defined types were placed in this encounter.     Procedures: No procedures performed   Clinical Data: No additional findings.  Objective: Vital Signs: LMP 08/14/1985 (Approximate)   Physical Exam:   Constitutional: Patient appears well-developed HEENT:  Head: Normocephalic Eyes:EOM are normal Neck: Normal range of motion Cardiovascular: Normal rate Pulmonary/chest: Effort normal Neurologic: Patient is alert Skin: Skin is warm Psychiatric: Patient has normal mood and affect    Ortho Exam: Orthopedic exam demonstrates  tenderness at the base the fifth metatarsal with intact foot eversion bilaterally.  Palpable intact nontender anterior to posterior to peroneal and Achilles tendons.  No other masses lymphadenopathy or skin changes noted in the right foot region.  Specialty Comments:  No specialty comments available.  Imaging: No results found.   PMFS History: Patient Active Problem List   Diagnosis Date Noted  . Status post foot surgery 06/27/2013  . Other hammer toe (acquired) 06/10/2013  . Pain in foot 06/10/2013  . Onychomycosis 06/10/2013   Past Medical History:  Diagnosis Date  . COPD (chronic obstructive pulmonary disease) ([place])   . Glaucoma   . History of recurrent UTIs   . Hyperlipidemia   . Hypertension   . Lichen sclerosus January 2017   vulva/perianal region  . Non-melanoma skin cancer 08/2015   right leg  . Osteopenia   . Osteoporosis   . Post-menopausal      Family History  Problem Relation Age of Onset  . Breast cancer Mother        deceased -53's  . Heart attack Father   . Heart attack Brother     Past Surgical History:  Procedure Laterality Date  . FOOT SURGERY Right 06/2013   right 2nd toe  . HEMATOMA EVACUATION Left 2009   left calf   Social History   Occupational History  . Not on  file  Tobacco Use  . Smoking status: Never Smoker  . Smokeless tobacco: Never Used  Substance and Sexual Activity  . Alcohol use: Not on file  . Drug use: No  . Sexual activity: Yes    Birth control/protection: Post-menopausal

## 2018-03-15 ENCOUNTER — Other Ambulatory Visit: Payer: Self-pay | Admitting: Internal Medicine

## 2018-03-15 DIAGNOSIS — Z1231 Encounter for screening mammogram for malignant neoplasm of breast: Secondary | ICD-10-CM

## 2018-03-29 ENCOUNTER — Telehealth (INDEPENDENT_AMBULATORY_CARE_PROVIDER_SITE_OTHER): Payer: Self-pay | Admitting: Orthopedic Surgery

## 2018-03-29 NOTE — Telephone Encounter (Signed)
Please review and advise. Thanks.  

## 2018-03-29 NOTE — Telephone Encounter (Signed)
Medication refill  Pennsaid     Patient called would like to inform Dr.Dean that her insurance company will not pay for MRI. Patient stated she is doing well would like to continue peensaid if possible. Patient would also like to know can she return to her normal activities.

## 2018-03-30 NOTE — Telephone Encounter (Signed)
Yes and yes 

## 2018-04-01 NOTE — Telephone Encounter (Signed)
I tried calling. No answer. LMVM advising per Dr Marlou Sa.

## 2018-04-29 ENCOUNTER — Ambulatory Visit
Admission: RE | Admit: 2018-04-29 | Discharge: 2018-04-29 | Disposition: A | Discharge status: Home / Self Care | Payer: Medicare HMO | Source: Ambulatory Visit | Attending: Internal Medicine | Admitting: Internal Medicine

## 2018-04-29 DIAGNOSIS — Z1231 Encounter for screening mammogram for malignant neoplasm of breast: Secondary | ICD-10-CM

## 2018-04-29 IMAGING — MG DIGITAL SCREENING BILATERAL MAMMOGRAM WITH TOMO AND CAD
6 of 10 series · 6 of 30 positions shown · non-contrast
Comparison: Previous exam(s).

CLINICAL DATA: Screening.

EXAM:
DIGITAL SCREENING BILATERAL MAMMOGRAM WITH TOMO AND CAD

[L CC synth-2D]
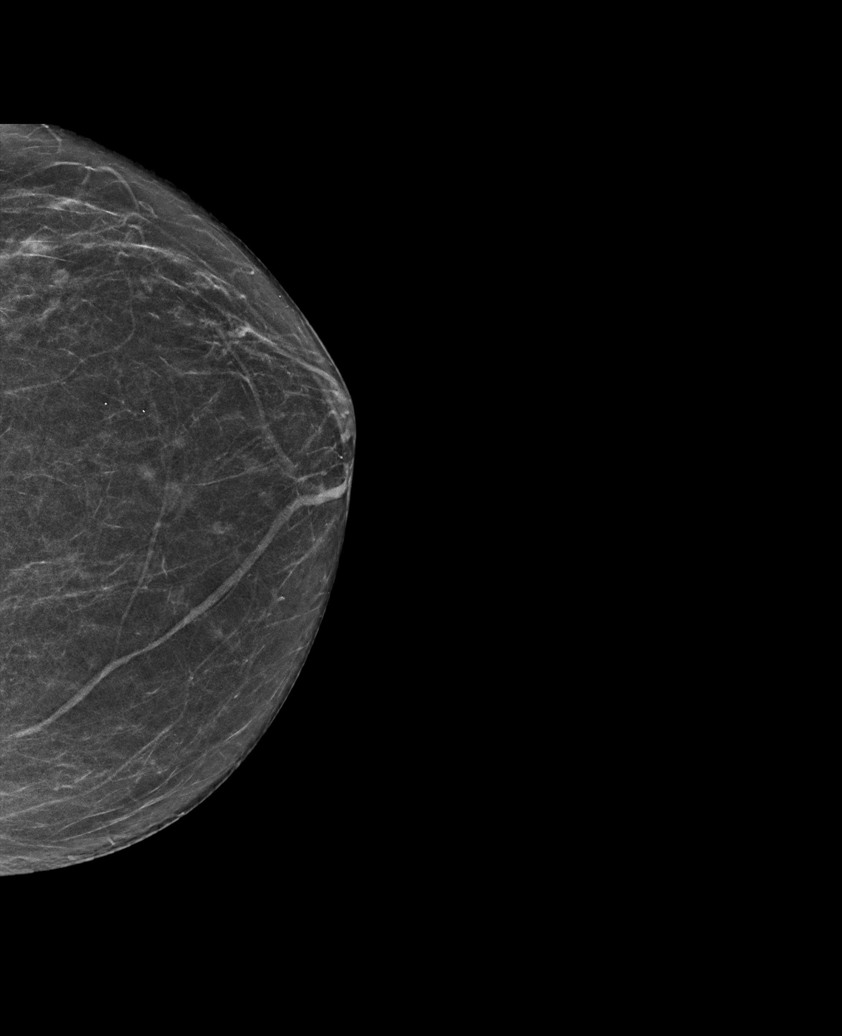

[R MLO synth-2D (1 of 2)]
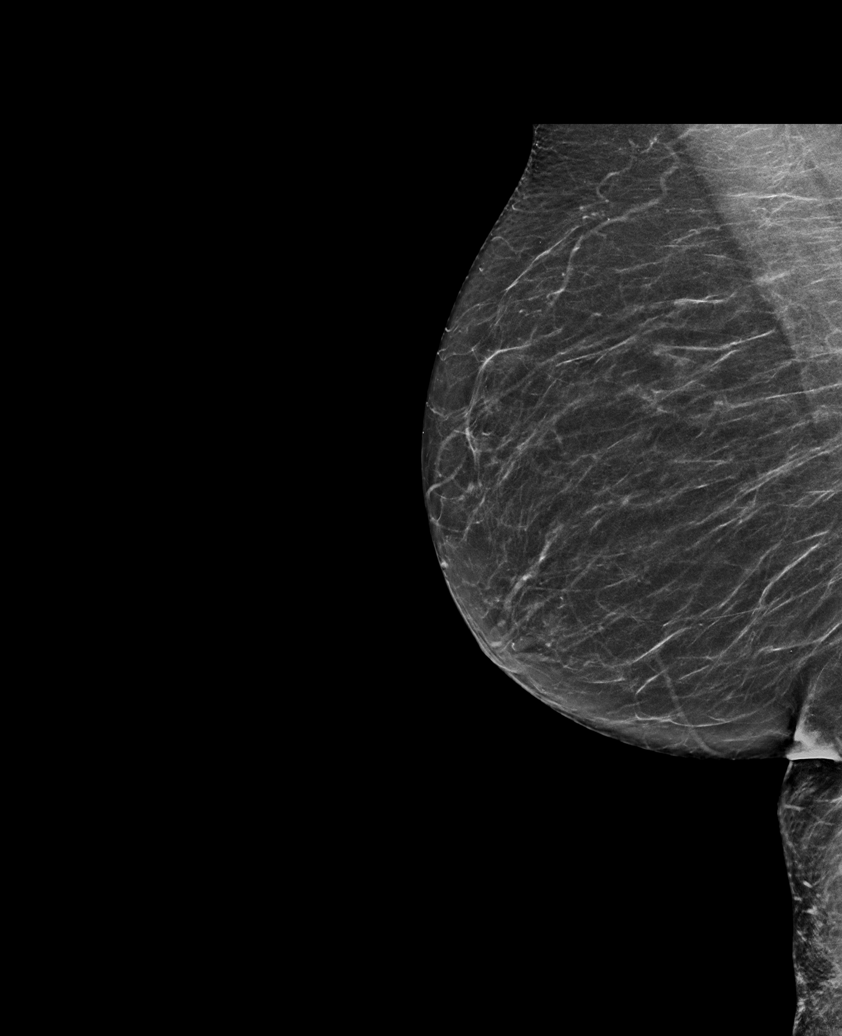

[L MLO synth-2D]
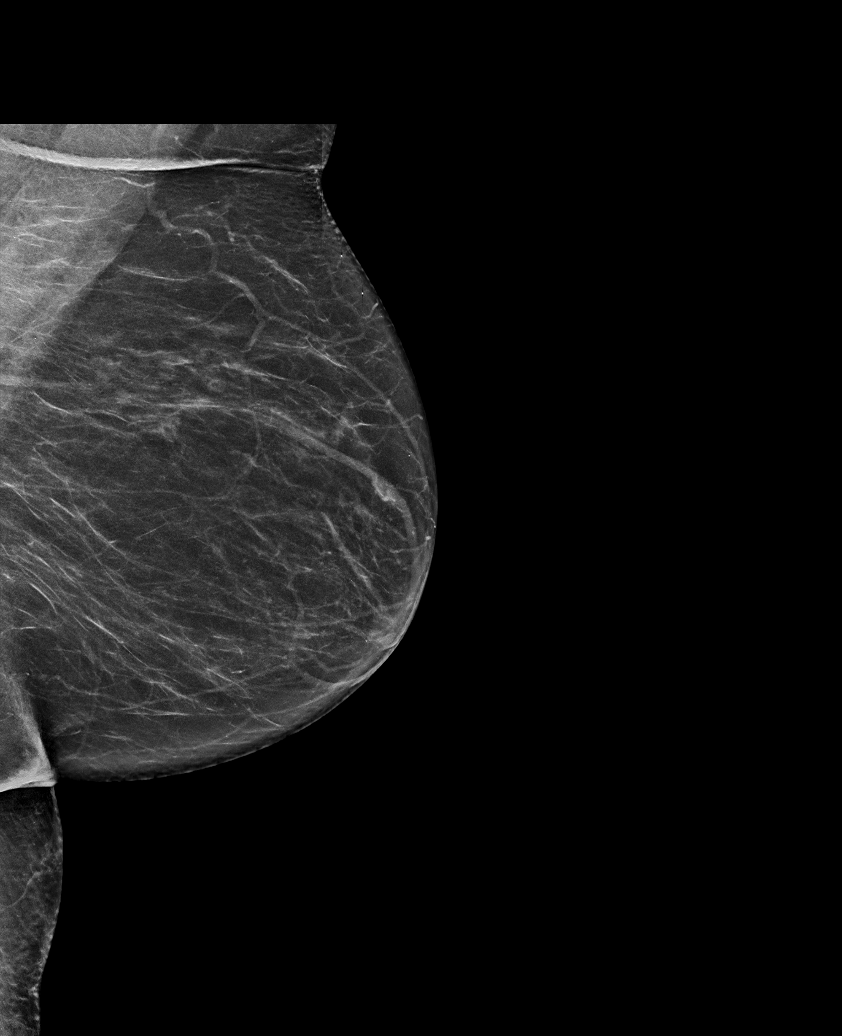

[R MLO synth-2D (2 of 2)]
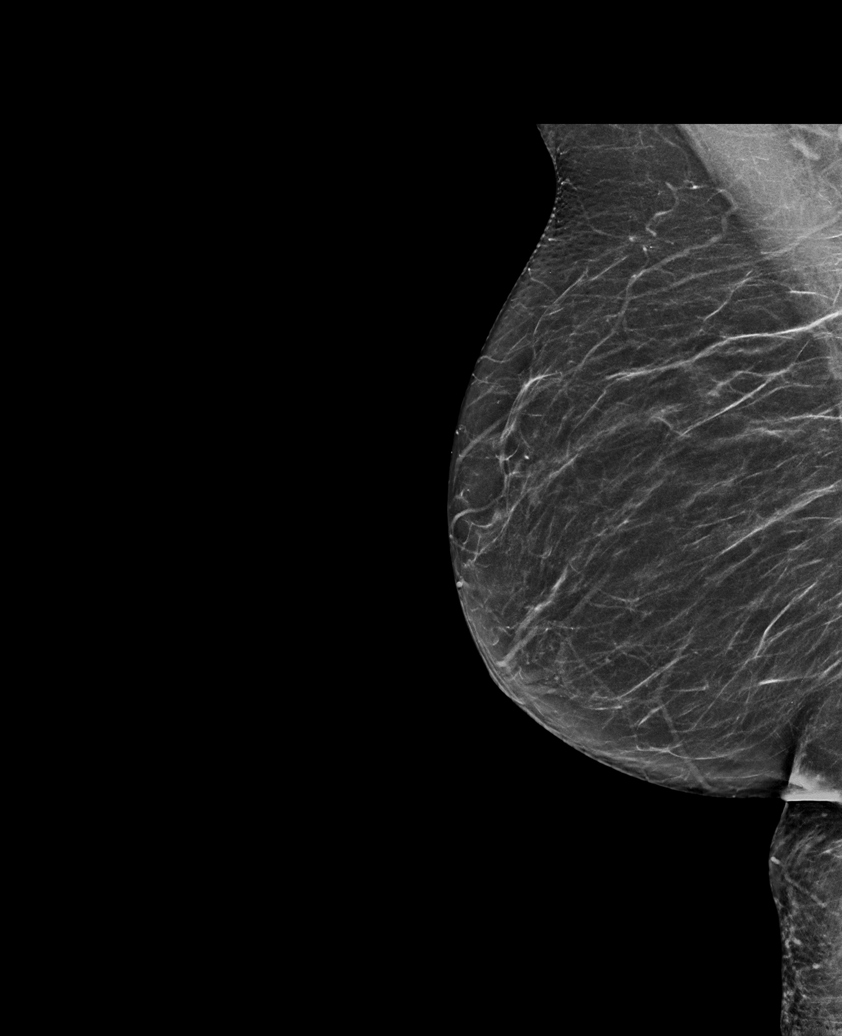

[R CC synth-2D]
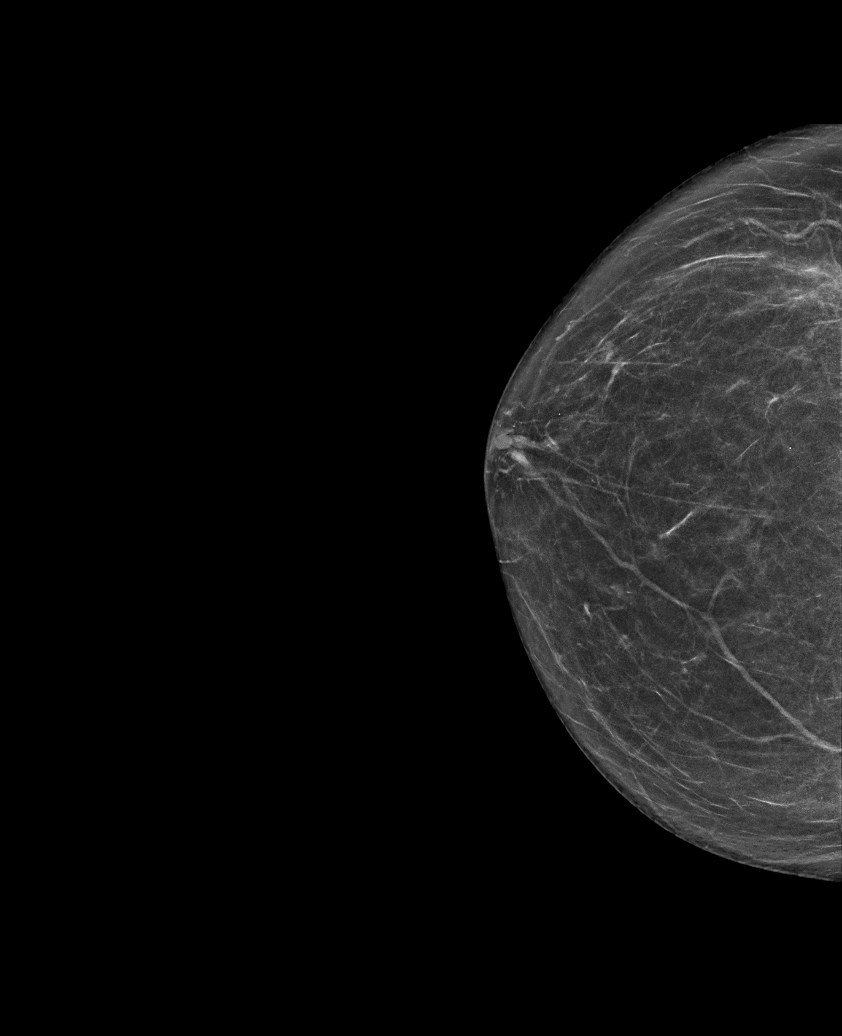

[L CC tomo · tomo slice 29/57.0]
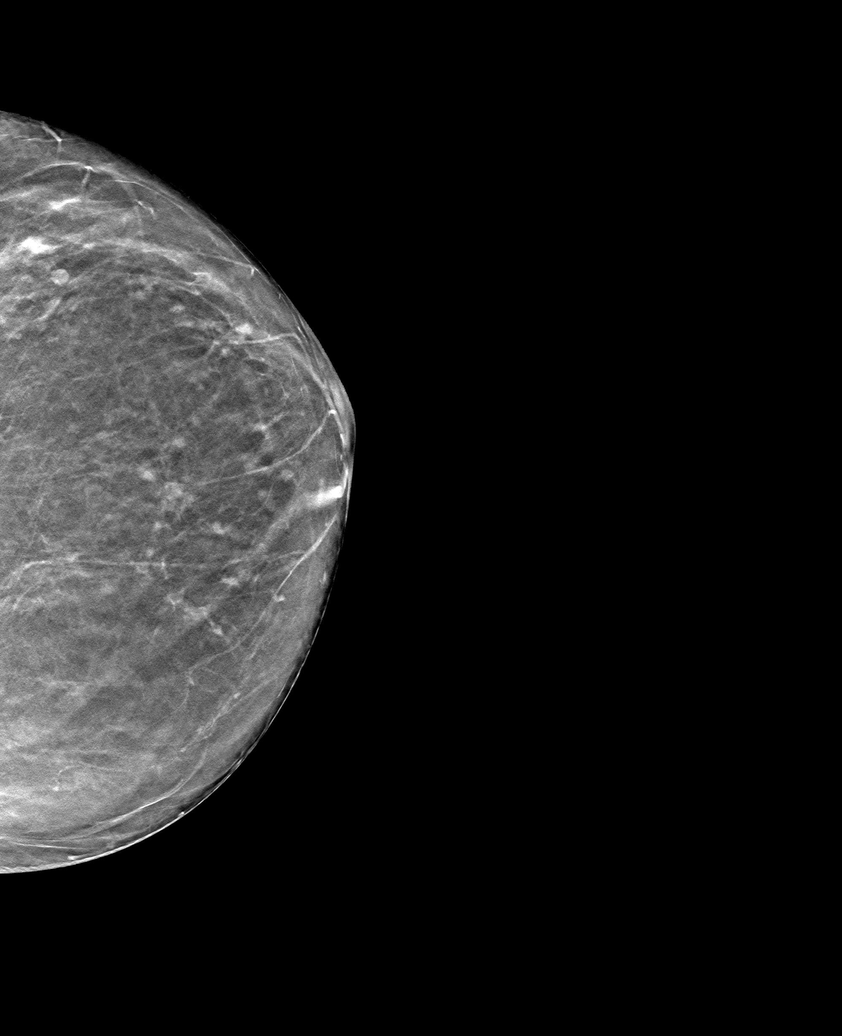

[6 of 30 positions shown; findings below may reference images not displayed]

ACR Breast Density Category b: There are scattered areas of
fibroglandular density.
FINDINGS: There are no findings suspicious for malignancy. Images were
processed with CAD.
IMPRESSION: No mammographic evidence of malignancy. A result letter of this
screening mammogram will be mailed directly to the patient.

RECOMMENDATION:
Screening mammogram in one year. (Code:[TQ])

BI-RADS CATEGORY  1: Negative.

## 2018-06-20 ENCOUNTER — Other Ambulatory Visit: Payer: Self-pay | Admitting: Internal Medicine

## 2018-06-20 DIAGNOSIS — M810 Age-related osteoporosis without current pathological fracture: Secondary | ICD-10-CM

## 2018-07-08 ENCOUNTER — Ambulatory Visit (INDEPENDENT_AMBULATORY_CARE_PROVIDER_SITE_OTHER): Payer: Medicare HMO | Admitting: Family Medicine

## 2018-07-08 ENCOUNTER — Encounter (INDEPENDENT_AMBULATORY_CARE_PROVIDER_SITE_OTHER): Payer: Self-pay | Admitting: Family Medicine

## 2018-07-08 DIAGNOSIS — M25561 Pain in right knee: Secondary | ICD-10-CM

## 2018-07-08 DIAGNOSIS — G8929 Other chronic pain: Secondary | ICD-10-CM | POA: Diagnosis not present

## 2018-07-08 NOTE — Progress Notes (Signed)
   Office Visit Note   Patient: [name]           Date of Birth: 10/31/1945           MRN: [id number] Visit Date: 07/08/2018 Requested by: Jilda Panda, MD [address] [place], [place] [address] PCP: Jilda Panda, MD  Subjective: Chief Complaint  Patient presents with  . Right Knee - Pain    Knee twisted and popped with helping to pick up and carry a table 2 weeks ago. Wearing hinged knee brace today - helps.  Requests cortisone injection.    HPI: She is here with right knee pain.  Long-standing history of arthritis in her knee.  She has done well in the past with injections, with cortisone and Euflexxa.  She would like to have a cortisone injection today.  Pain mainly on the anterior medial aspect.                ROS: She is otherwise in good health.  Noncontributory.  Objective: Vital Signs: LMP 08/14/1985 (Approximate)   Physical Exam:  Right knee: Trace effusion, no warmth or erythema.  2+ patellofemoral crepitus.  Pain with patella compression and with palpation of the lateral patellofemoral joint.  Tender also on the medial joint line.  Full active extension and flexion of 130 degrees.  Imaging: None today.  Assessment & Plan: 1.  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  If only short-term relief, then Euflexxa.   Follow-Up Instructions: No follow-ups on file.      Procedures: Right knee steroid injection: After sterile prep with Betadine, injected 5 cc 1% lidocaine without epinephrine and 40 mg methylprednisolone from lateral midpatellar approach.    PMFS History: Patient Active Problem List   Diagnosis Date Noted  . Status post foot surgery 06/27/2013  . Other hammer toe (acquired) 06/10/2013  . Pain in foot 06/10/2013  . Onychomycosis 06/10/2013   Past Medical History:  Diagnosis Date  . COPD (chronic obstructive pulmonary disease) ([place])   . Glaucoma   . History of recurrent UTIs   . Hyperlipidemia   .  Hypertension   . Lichen sclerosus January 2017   vulva/perianal region  . Non-melanoma skin cancer 08/2015   right leg  . Osteopenia   . Osteoporosis   . Post-menopausal      Family History  Problem Relation Age of Onset  . Breast cancer Mother        deceased -10's  . Heart attack Father   . Heart attack Brother     Past Surgical History:  Procedure Laterality Date  . FOOT SURGERY Right 06/2013   right 2nd toe  . HEMATOMA EVACUATION Left 2009   left calf   Social History   Occupational History  . Not on file  Tobacco Use  . Smoking status: Never Smoker  . Smokeless tobacco: Never Used  Substance and Sexual Activity  . Alcohol use: Not on file  . Drug use: No  . Sexual activity: Yes    Birth control/protection: Post-menopausal

## 2018-07-18 ENCOUNTER — Ambulatory Visit
Admission: RE | Admit: 2018-07-18 | Discharge: 2018-07-18 | Disposition: A | Discharge status: Home / Self Care | Payer: Medicare HMO | Source: Ambulatory Visit | Attending: Internal Medicine | Admitting: Internal Medicine

## 2018-07-18 DIAGNOSIS — M810 Age-related osteoporosis without current pathological fracture: Secondary | ICD-10-CM

## 2018-08-01 ENCOUNTER — Telehealth: Payer: Self-pay | Admitting: Obstetrics and Gynecology

## 2018-08-01 NOTE — Telephone Encounter (Signed)
Spoke with patient. Patient requesting to review GYN med hx prior to colonoscopy procedure today. Hx reviewed, perianal dermatitis questions answered. Patient thankful for return call.    Encounter closed.

## 2018-08-01 NOTE — Telephone Encounter (Signed)
Patient is requesting a call back from the triage nurse today before 11:00am when she is leaving for her colonoscopy appointment today. She has some questions about the name of the diagnosis of a problem she has that she doesn't remember the name of. She'd like to give the information to her doctor who is doing her colonoscopy today.

## 2018-08-22 ENCOUNTER — Encounter (INDEPENDENT_AMBULATORY_CARE_PROVIDER_SITE_OTHER): Payer: Self-pay | Admitting: Family Medicine

## 2018-08-22 ENCOUNTER — Ambulatory Visit (INDEPENDENT_AMBULATORY_CARE_PROVIDER_SITE_OTHER): Payer: Medicare Other | Admitting: Family Medicine

## 2018-08-22 DIAGNOSIS — M25562 Pain in left knee: Secondary | ICD-10-CM

## 2018-08-22 DIAGNOSIS — G8929 Other chronic pain: Secondary | ICD-10-CM

## 2018-08-22 NOTE — Progress Notes (Signed)
Office Visit Note   Patient: [name]           Date of Birth: 06-20-1946           MRN: [id number] Visit Date: 08/22/2018 Requested by: Jilda Panda, MD [address] [place], [place] [address] PCP: Jilda Panda, MD  Subjective: Chief Complaint  Patient presents with  . Left Knee - Pain    Pain posteromedial aspect of knee since 08/16/18, after much standing. Was seen at [facility] and then a doppler to r/o blood clot - negative, but showed Baker's cyst.    HPI: She is a 73 year old with left knee pain.  Symptoms started this past Friday, she was doing a lot of cooking that evening.  She did not have any pain when she went to bed, but when she woke up she had severe pain in the posterior knee with some redness.  She went to urgent care where x-rays were negative, she was referred for a Doppler which was negative for DVT but positive for a Baker's cyst.  She has been using Voltaren gel with some improvement but she still has continuous pain.  A couple years ago she had x-rays of her left knee showing chondrocalcinosis and DJD.              ROS: Noncontributory  Objective: Vital Signs: LMP 08/14/1985 (Approximate)   Physical Exam:  Left knee: No warmth or erythema, 1+ effusion.  2+ patellofemoral crepitus.  There is some tenderness in the popliteal fossa, she has a small Baker's cyst.   Imaging: None today  Assessment & Plan: 1.  Left knee pain with underlying DJD, possible pseudogout, and Baker's cyst -Discussed options with her and elected to perform an intra-articular injection.  If symptoms persist we could aspirate and inject the Baker's cyst.   Follow-Up Instructions: No follow-ups on file.      Procedures: After sterile prep with Betadine injected 5 cc 1% lidocaine without epinephrine and 40 mg methylprednisolone from superolateral approach, a flash of clear yellow synovial fluid was obtained prior to injection.    PMFS History: Patient  Active Problem List   Diagnosis Date Noted  . Status post foot surgery 06/27/2013  . Other hammer toe (acquired) 06/10/2013  . Pain in foot 06/10/2013  . Onychomycosis 06/10/2013   Past Medical History:  Diagnosis Date  . COPD (chronic obstructive pulmonary disease) ([place])   . Glaucoma   . History of recurrent UTIs   . Hyperlipidemia   . Hypertension   . Lichen sclerosus January 2017   vulva/perianal region  . Non-melanoma skin cancer 08/2015   right leg  . Osteopenia   . Osteoporosis   . Post-menopausal      Family History  Problem Relation Age of Onset  . Breast cancer Mother        deceased -33's  . Heart attack Father   . Heart attack Brother     Past Surgical History:  Procedure Laterality Date  . FOOT SURGERY Right 06/2013   right 2nd toe  . HEMATOMA EVACUATION Left 2009   left calf   Social History   Occupational History  . Not on file  Tobacco Use  . Smoking status: Never Smoker  . Smokeless tobacco: Never Used  Substance and Sexual Activity  . Alcohol use: Not on file  . Drug use: No  . Sexual activity: Yes    Birth control/protection: Post-menopausal

## 2018-09-02 ENCOUNTER — Telehealth: Payer: Self-pay | Admitting: Obstetrics and Gynecology

## 2018-09-02 NOTE — Telephone Encounter (Signed)
Patient is asking to talk with Dr.Jerton's nurse, no details given. She stated that this is not urgent, but would like to talk with a nurse today.

## 2018-09-02 NOTE — Telephone Encounter (Signed)
Spoke with patient. Patient reports a painful knot at urethra, skin burns when urinating, noticed when bathing this week. Reports pain at rectum, skin is white with red splotches. Currently applying a compounded medication to rectum daily, prescribed by GI, provides little relief. Denies vaginal or rectal bleeding, urinary frequency or urgency, fever/chills. Patient states she is not applying any other medications. Hx of perianal dermatitis and anal fissure.   Recommended OV for further evaluation, OV scheduled for 1/21 at 2:30pm with Dr. Talbert Nan.   Routing to provider for final review. Patient is agreeable to disposition. Will close encounter.

## 2018-09-03 ENCOUNTER — Telehealth: Payer: Self-pay | Admitting: Obstetrics and Gynecology

## 2018-09-03 ENCOUNTER — Encounter: Payer: Self-pay | Admitting: Obstetrics and Gynecology

## 2018-09-03 ENCOUNTER — Ambulatory Visit: Payer: Medicare Other | Admitting: Obstetrics and Gynecology

## 2018-09-03 ENCOUNTER — Other Ambulatory Visit: Payer: Self-pay

## 2018-09-03 VITALS — BP 128/86 | HR 80 | Wt 206.0 lb

## 2018-09-03 DIAGNOSIS — L309 Dermatitis, unspecified: Secondary | ICD-10-CM

## 2018-09-03 DIAGNOSIS — K602 Anal fissure, unspecified: Secondary | ICD-10-CM

## 2018-09-03 DIAGNOSIS — N9089 Other specified noninflammatory disorders of vulva and perineum: Secondary | ICD-10-CM | POA: Diagnosis not present

## 2018-09-03 DIAGNOSIS — L900 Lichen sclerosus et atrophicus: Secondary | ICD-10-CM | POA: Diagnosis not present

## 2018-09-03 LAB — POCT URINALYSIS DIPSTICK
Bilirubin, UA: NEGATIVE
Blood, UA: NEGATIVE
GLUCOSE UA: NEGATIVE
Ketones, UA: NEGATIVE
Nitrite, UA: NEGATIVE
PROTEIN UA: NEGATIVE
Spec Grav, UA: 1.01 (ref 1.010–1.025)
Urobilinogen, UA: 0.2 E.U./dL
pH, UA: 5 (ref 5.0–8.0)

## 2018-09-03 MED ORDER — LIDOCAINE 5 % EX OINT: 1.0000 "application " | TOPICAL_OINTMENT | Freq: Four times a day (QID) | CUTANEOUS | 0 refills | Status: DC | PRN

## 2018-09-03 MED ORDER — CLOBETASOL PROPIONATE 0.05 % EX OINT: 1.0000 "application " | TOPICAL_OINTMENT | Freq: Two times a day (BID) | CUTANEOUS | 0 refills | Status: DC

## 2018-09-03 NOTE — Telephone Encounter (Signed)
Return in about 2 weeks (around 09/17/2018) for f/u and possible vulvar biopsy. Will nurse please assist in scheduling? Thank you

## 2018-09-03 NOTE — Progress Notes (Signed)
GYNECOLOGY  VISIT   HPI: 73 y.o.   Widowed White or Caucasian Not Hispanic or Latino  female   [phone] with Patient's last menstrual period was 08/14/1985 (approximate).  She has a h/o chronic vulvitis, lichen sclerosis, perianal dermatitis and an anal fissure. No longer having issues with incontinence. here for a painful knot at urethra, noticed it last week. She c/o her perineal skin burning when urinating since Christmas.  Reports pain at rectum, skin is white with red splotches. Currently applying a nitroglycerin 0.125 % compounded medication to rectum daily, still hurts to have a BM. She has started to take benifiber and miralax. Denies vaginal or rectal bleeding, urinary frequency or urgency, fever/chills.  GYNECOLOGIC HISTORY: Patient's last menstrual period was 08/14/1985 (approximate). Contraception: Postmenopausal Menopausal hormone therapy: None        OB History    Gravida  6   Para  1   Term  1   Preterm  0   AB  5   Living  1     SAB  5   TAB  0   Ectopic  0   Multiple  0   Live Births  1              Patient Active Problem List   Diagnosis Date Noted  . Status post foot surgery 06/27/2013  . Other hammer toe (acquired) 06/10/2013  . Pain in foot 06/10/2013  . Onychomycosis 06/10/2013    Past Medical History:  Diagnosis Date  . COPD (chronic obstructive pulmonary disease) ([place])   . Glaucoma   . History of recurrent UTIs   . Hyperlipidemia   . Hypertension   . Lichen sclerosus January 2017   vulva/perianal region  . Non-melanoma skin cancer 08/2015   right leg  . Osteopenia   . Osteoporosis   . Post-menopausal      Past Surgical History:  Procedure Laterality Date  . FOOT SURGERY Right 06/2013   right 2nd toe  . HEMATOMA EVACUATION Left 2009   left calf    Current Outpatient Medications  Medication Sig Dispense Refill  . albuterol (PROAIR HFA) 108 (90 BASE) MCG/ACT inhaler Inhale 2 puffs into the lungs every 6 (six) hours as  needed for wheezing or shortness of breath.    Marland Kitchen aspirin 81 MG tablet Take 81 mg by mouth daily.    Marland Kitchen atorvastatin (LIPITOR) 20 MG tablet Take 1 tablet by mouth at bedtime.  11  . betamethasone valerate ointment (VALISONE) 0.1 % Apply a pea sized amount 3 x a week 30 g 3  . budesonide-formoterol (SYMBICORT) 160-4.5 MCG/ACT inhaler Inhale 2 puffs into the lungs every morning.    . calcium carbonate (OS-CAL) 600 MG TABS tablet Take 600 mg by mouth 2 (two) times daily with a meal.    . Cholecalciferol (VITAMIN D-3) 1000 UNITS CAPS Take by mouth daily.    . clindamycin (CLEOCIN) 2 % vaginal cream INSERT ONE APPLICATORFUL BY VAGINAL ROUTE EVERY DAY AT BEDTIME  0  . clobetasol ointment (TEMOVATE) 5.63 % Apply 1 application topically 2 (two) times daily. Apply a small amount twice daily for 2 weeks 30 g 0  . Coenzyme Q10 (COQ-10 PO) Take by mouth.    . diclofenac sodium (VOLTAREN) 1 % GEL Apply topically 4 (four) times daily.    Marland Kitchen latanoprost (XALATAN) 0.005 % ophthalmic solution Place 1 drop into both eyes at bedtime.    . lidocaine (XYLOCAINE) 5 % ointment Apply 1 application topically 4 (  four) times daily as needed. 30 g 0  . losartan (COZAAR) 100 MG tablet     . Multiple Vitamins-Minerals (CENTRUM SILVER ADULT 50+ PO) Take by mouth daily.    . Omega-3 Fatty Acids (FISH OIL) 1000 MG CAPS Take 1 capsule by mouth daily.    Marland Kitchen oxybutynin (DITROPAN) 5 MG tablet Take 5 mg by mouth 3 (three) times daily.    . vitamin E 400 UNIT capsule Take 400 Units by mouth daily.     No current facility-administered medications for this visit.      ALLERGIES: Contrast media [iodinated diagnostic agents]; Ether; and Iohexol  Family History  Problem Relation Age of Onset  . Breast cancer Mother        deceased -32's  . Heart attack Father   . Heart attack Brother     Social History   Socioeconomic History  . Marital status: Widowed    Spouse name: Not on file  . Number of children: Not on file  . Years of  education: Not on file  . Highest education level: Not on file  Occupational History  . Not on file  Social Needs  . Financial resource strain: Not on file  . Food insecurity:    Worry: Not on file    Inability: Not on file  . Transportation needs:    Medical: Not on file    Non-medical: Not on file  Tobacco Use  . Smoking status: Never Smoker  . Smokeless tobacco: Never Used  Substance and Sexual Activity  . Alcohol use: Not Currently  . Drug use: No  . Sexual activity: Not Currently    Birth control/protection: Post-menopausal  Lifestyle  . Physical activity:    Days per week: Not on file    Minutes per session: Not on file  . Stress: Not on file  Relationships  . Social connections:    Talks on phone: Not on file    Gets together: Not on file    Attends religious service: Not on file    Active member of club or organization: Not on file    Attends meetings of clubs or organizations: Not on file    Relationship status: Not on file  . Intimate partner violence:    Fear of current or ex partner: Not on file    Emotionally abused: Not on file    Physically abused: Not on file    Forced sexual activity: Not on file  Other Topics Concern  . Not on file  Social History Narrative  . Not on file    Review of Systems  Constitutional: Negative.   HENT: Negative.   Eyes: Negative.   Respiratory: Negative.   Cardiovascular: Negative.   Gastrointestinal:       Indegestion  Genitourinary: Positive for dysuria.  Musculoskeletal: Negative.   Skin: Negative.   Neurological: Negative.   Endo/Heme/Allergies: Negative.   Psychiatric/Behavioral: Negative.   External dysuria only  PHYSICAL EXAMINATION:    BP 128/86 (BP Location: Left Arm, Patient Position: Sitting, Cuff Size: Normal)   Pulse 80   Wt 206 lb (93.4 kg)   LMP 08/14/1985 (Approximate)   BMI 34.28 kg/m     General appearance: alert, cooperative and appears stated age  Pelvic: External genitalia:  Diffuse  whitening, mild loss of architecture. She has a 4 mm raised white lump on the left labia majora (that is the region she noted the lump). Hard to tell if it is a cyst with the whitening  of the vulvar skin.               Urethra:  normal appearing urethra with no masses, tenderness or lesions              Bartholins and Skenes: normal     Perianal region with severe dermatitis, erythema, whitening, several skin fissures.                Chaperone was present for exam.  ASSESSMENT Perianal dermatitis Lichen sclerosis Left vulvar lump     PLAN Will treat perianal dermatitis with clobetasol ointment BID x 2 weeks Can use the valisone ointment on the vulva (short term) Lidocaine ointment also prescribed She will return in 2 weeks for f/u, possible vulvar biopsy Can use Vaseline as needed Discussed voiding in a sitz bath if needed Aware of vulvar/perianal skin care   An After Visit Summary was printed and given to the patient.  ~15 minutes face to face time of which over 50% was spent in counseling.

## 2018-09-03 NOTE — Patient Instructions (Signed)
Anal Fissure, Adult    An anal fissure is a small tear or crack in the tissue of the anus. Bleeding from a fissure usually stops on its own within a few minutes. However, bleeding will often occur again with each bowel movement until the fissure heals.  What are the causes?  This condition is usually caused by passing a large or hard stool (feces). Other causes include:   Constipation.   Frequent diarrhea.   Inflammatory bowel disease (Crohn's disease or ulcerative colitis).   Childbirth.   Infections.   Anal sex.  What are the signs or symptoms?  Symptoms of this condition include:   Bleeding from the rectum.   Small amounts of blood seen on your stool, on the toilet paper, or in the toilet after a bowel movement. The blood coats the outside of the stool and is not mixed with the stool.   Painful bowel movements.   Itching or irritation around the anus.  How is this diagnosed?  A health care provider may diagnose this condition by closely examining the anal area. An anal fissure can usually be seen with careful inspection. In some cases, a rectal exam may be performed, or a short tube (anoscope) may be used to examine the anal canal.  How is this treated?  Initial treatment for this condition may include:   Taking steps to avoid constipation. This may include making changes to your diet, such as increasing your intake of fiber or fluid.   Taking fiber supplements. These supplements can soften your stool to help make bowel movements easier. Your health care provider may also prescribe a stool softener if your stool is hard.   Taking sitz baths. This may help to heal the tear.   Using medicated creams or ointments. These may be prescribed to lessen discomfort.  Treatments that are sometimes used if initial treatments do not work well or if the condition is more severe may include:   Botulinum injection.   Surgery to repair the fissure.  Follow these instructions at home:  Eating and  drinking     Avoid foods that may cause constipation, such as bananas, milk, and other dairy products.   Eat all fruits, except bananas.   Drink enough fluid to keep your urine pale yellow.   Eat foods that are high in fiber, such as beans, whole grains, and fresh fruits and vegetables.  General instructions     Take over-the-counter and prescription medicines only as told by your health care provider.   Use creams or ointments only as told by your health care provider.   Keep the anal area clean and dry.   Take sitz baths as told by your health care provider. Do not use soap in the sitz baths.   Keep all follow-up visits as told by your health care provider. This is important.  Contact a health care provider if you have:   More bleeding.   A fever.   Diarrhea that is mixed with blood.   Pain that continues.   Ongoing problems that are getting worse rather than better.  Summary   An anal fissure is a small tear or crack in the tissue of the anus. This condition is usually caused by passing a large or hard stool (feces). Other causes include constipation and frequent diarrhea.   Initial treatment for this condition may include taking steps to avoid constipation, such as increasing your intake of fiber or fluid.   Follow instructions for care   as told by your health care provider.   Contact your health care provider if you have more bleeding or your problem is getting worse rather than better.   Keep all follow-up visits as told by your health care provider. This is important.  This information is not intended to replace advice given to you by your health care provider. Make sure you discuss any questions you have with your health care provider.  Document Released: 07/31/2005 Document Revised: 01/10/2018 Document Reviewed: 01/10/2018  Elsevier Interactive Patient Education  2019 [employer]

## 2018-09-03 NOTE — Telephone Encounter (Signed)
Spoke with patient. 2 week f/u with possible vulvar biopsy scheduled for 2/4 at 11am with Dr. Talbert Nan. Order placed for precert. Patient verbalizes understanding.   Routing to provider for final review. Patient is agreeable to disposition. Will close encounter.  Cc: Magdalene Patricia

## 2018-09-05 ENCOUNTER — Telehealth: Payer: Self-pay | Admitting: Obstetrics and Gynecology

## 2018-09-05 NOTE — Telephone Encounter (Signed)
Patient states upon opening lidocaine ointment, half of it came out. Pharmacy gave her an 800 number to call to replace it, but patient has misplaced the number.

## 2018-09-05 NOTE — Telephone Encounter (Signed)
Spoke with [name] at [facility]. Was provided manufacture contact number [phone]. Patient to call and provide lot number to manufacture, the manufacture will then contact pharamcy and send new med to pharmacy to dispense to patient.

## 2018-09-05 NOTE — Telephone Encounter (Signed)
Spoke with patient advise as seen below per pharmacy. Contact information provided. Patient read back instructions. Patient verbalizes understanding. Is aware to return call if further assistance needed.  Encounter closed.

## 2018-09-17 ENCOUNTER — Other Ambulatory Visit: Payer: Self-pay

## 2018-09-17 ENCOUNTER — Encounter: Payer: Self-pay | Admitting: Obstetrics and Gynecology

## 2018-09-17 ENCOUNTER — Ambulatory Visit: Payer: Medicare Other | Admitting: Obstetrics and Gynecology

## 2018-09-17 VITALS — BP 120/82 | HR 68 | Wt 188.4 lb

## 2018-09-17 DIAGNOSIS — L309 Dermatitis, unspecified: Secondary | ICD-10-CM

## 2018-09-17 DIAGNOSIS — L98491 Non-pressure chronic ulcer of skin of other sites limited to breakdown of skin: Secondary | ICD-10-CM | POA: Diagnosis not present

## 2018-09-17 DIAGNOSIS — L900 Lichen sclerosus et atrophicus: Secondary | ICD-10-CM

## 2018-09-17 DIAGNOSIS — N763 Subacute and chronic vulvitis: Secondary | ICD-10-CM | POA: Diagnosis not present

## 2018-09-17 NOTE — Progress Notes (Signed)
GYNECOLOGY  VISIT   HPI: 73 y.o.   Widowed White or Caucasian Not Hispanic or Latino  female   [phone] with Patient's last menstrual period was 08/14/1985 (approximate).   here for 2 week recheck. Possible vulvar biopsy.    She was seen for lichen sclerosis and a severe perianal dermatitis 2 weeks ago. She was treated with clobetasol ointment BID, given lidocaine ointment as well. She reports no significant improvement. No longer itchy, just very sore. She is so uncomfortable that it is hard to move or sit. She isn't wearing underwear, was doing sitz baths until Sunday when she misplaced it.   GYNECOLOGIC HISTORY: Patient's last menstrual period was 08/14/1985 (approximate). Contraception: Postmenopausal Menopausal hormone therapy: None        OB History    Gravida  6   Para  1   Term  1   Preterm  0   AB  5   Living  1     SAB  5   TAB  0   Ectopic  0   Multiple  0   Live Births  1              Patient Active Problem List   Diagnosis Date Noted  . Status post foot surgery 06/27/2013  . Other hammer toe (acquired) 06/10/2013  . Pain in foot 06/10/2013  . Onychomycosis 06/10/2013    Past Medical History:  Diagnosis Date  . COPD (chronic obstructive pulmonary disease) ([place])   . Glaucoma   . History of recurrent UTIs   . Hyperlipidemia   . Hypertension   . Lichen sclerosus January 2017   vulva/perianal region  . Non-melanoma skin cancer 08/2015   right leg  . Osteopenia   . Osteoporosis   . Post-menopausal      Past Surgical History:  Procedure Laterality Date  . FOOT SURGERY Right 06/2013   right 2nd toe  . HEMATOMA EVACUATION Left 2009   left calf    Current Outpatient Medications  Medication Sig Dispense Refill  . albuterol (PROAIR HFA) 108 (90 BASE) MCG/ACT inhaler Inhale 2 puffs into the lungs every 6 (six) hours as needed for wheezing or shortness of breath.    Marland Kitchen aspirin 81 MG tablet Take 81 mg by mouth daily.    Marland Kitchen atorvastatin (LIPITOR)  20 MG tablet Take 1 tablet by mouth at bedtime.  11  . betamethasone valerate ointment (VALISONE) 0.1 % Apply a pea sized amount 3 x a week 30 g 3  . budesonide-formoterol (SYMBICORT) 160-4.5 MCG/ACT inhaler Inhale 2 puffs into the lungs every morning.    . calcium carbonate (OS-CAL) 600 MG TABS tablet Take 600 mg by mouth 2 (two) times daily with a meal.    . Cholecalciferol (VITAMIN D-3) 1000 UNITS CAPS Take by mouth daily.    . clindamycin (CLEOCIN) 2 % vaginal cream INSERT ONE APPLICATORFUL BY VAGINAL ROUTE EVERY DAY AT BEDTIME  0  . clobetasol ointment (TEMOVATE) 5.32 % Apply 1 application topically 2 (two) times daily. Apply a small amount to the perianal area twice daily for 2 weeks 30 g 0  . Coenzyme Q10 (COQ-10 PO) Take by mouth.    . diclofenac sodium (VOLTAREN) 1 % GEL Apply topically 4 (four) times daily.    Marland Kitchen latanoprost (XALATAN) 0.005 % ophthalmic solution Place 1 drop into both eyes at bedtime.    . lidocaine (XYLOCAINE) 5 % ointment Apply 1 application topically 4 (four) times daily as needed. 30 g  0  . Multiple Vitamins-Minerals (CENTRUM SILVER ADULT 50+ PO) Take by mouth daily.    . Omega-3 Fatty Acids (FISH OIL) 1000 MG CAPS Take 1 capsule by mouth daily.    Marland Kitchen oxybutynin (DITROPAN) 5 MG tablet Take 5 mg by mouth 3 (three) times daily.    . vitamin E 400 UNIT capsule Take 400 Units by mouth daily.    Marland Kitchen losartan (COZAAR) 100 MG tablet      No current facility-administered medications for this visit.      ALLERGIES: Contrast media [iodinated diagnostic agents]; Ether; and Iohexol  Family History  Problem Relation Age of Onset  . Breast cancer Mother        deceased -76's  . Heart attack Father   . Heart attack Brother     Social History   Socioeconomic History  . Marital status: Widowed    Spouse name: Not on file  . Number of children: Not on file  . Years of education: Not on file  . Highest education level: Not on file  Occupational History  . Not on file   Social Needs  . Financial resource strain: Not on file  . Food insecurity:    Worry: Not on file    Inability: Not on file  . Transportation needs:    Medical: Not on file    Non-medical: Not on file  Tobacco Use  . Smoking status: Never Smoker  . Smokeless tobacco: Never Used  Substance and Sexual Activity  . Alcohol use: Not Currently  . Drug use: No  . Sexual activity: Not Currently    Birth control/protection: Post-menopausal  Lifestyle  . Physical activity:    Days per week: Not on file    Minutes per session: Not on file  . Stress: Not on file  Relationships  . Social connections:    Talks on phone: Not on file    Gets together: Not on file    Attends religious service: Not on file    Active member of club or organization: Not on file    Attends meetings of clubs or organizations: Not on file    Relationship status: Not on file  . Intimate partner violence:    Fear of current or ex partner: Not on file    Emotionally abused: Not on file    Physically abused: Not on file    Forced sexual activity: Not on file  Other Topics Concern  . Not on file  Social History Narrative  . Not on file    Review of Systems  Constitutional: Negative.   HENT: Negative.   Eyes: Negative.   Respiratory: Negative.   Cardiovascular: Negative.   Gastrointestinal: Negative.   Genitourinary:       Nocturia Perianal pain Perianal redness  Musculoskeletal:       Muscle weakness Muscle pain  Skin: Negative.   Neurological: Negative.   Endo/Heme/Allergies: Negative.   Psychiatric/Behavioral: Negative.     PHYSICAL EXAMINATION:    BP 120/82 (BP Location: Right Arm, Patient Position: Sitting, Cuff Size: Normal)   Pulse 68   Wt 188 lb 6.4 oz (85.5 kg)   LMP 08/14/1985 (Approximate)   BMI 31.35 kg/m     General appearance: alert, cooperative and appears stated age  Pelvic: External genitalia:  Diffuse whitening, mild loss of architecture, 4 mm raised lump on left vulva,  appears cystic and benign.               Urethra:  normal  appearing urethra with no masses, tenderness or lesions              Bartholins and Skenes: normal    Perianal area with continued dermatitis, on the inner right buttock is a new superficial irregularly shaped ulcer, just over 1 cm (borders are not raised). The whitening from the dermatitis has improved, in the crease of her buttock is continued erythema and some fissures. There is a small amount of very loose stool noted just at and outside the anus  Perianal biopsy: The risks of the procedure were reviewed with the patient and a consent was signed. The area was cleansed with betadine and injected with 1% lidocaine. A 4 mm punch biopsy was used to remove a circular piece of tissue. The defect was closed with 4-0 vicryl. The patient tolerated the procedure well.                 Chaperone was present for exam.  ASSESSMENT Lichen sclerosis stable Left vulvar cyst, appears stable and benign (will follow) Perianal dermatitis, itching has resolved but still very sore, some fissures in the skin. Whitening has improved, focal areas of erythema New perianal ulcer, doubt from steroid use, only using steroids for a couple of weeks    PLAN Affirm sent to check for yeast Biopsy of perianal ulcer done Will use the clobetasol only in the crease of the buttock (still red with fissures) Use A&D ointment liberally to the rest of the peri-anal area Discussed doing sitz baths 3 x a day Avoid underwear or any tight clothing Discussed avoiding certain foods (ie coffee, tomatoes, citrus can be associated with anal pruritis), list given She needs to balance having her stool the right consistency. Feces can be very irritating to the skin. She has a dermatologist, discussed seeing her for a second opinion  She will use valisone ointment to the vulva 2 x a week   An After Visit Summary was printed and given to the patient.  Over 15 minutes face to  face time of which over 50% was spent in counseling.

## 2018-09-17 NOTE — Patient Instructions (Signed)
Use the clobetasol ointment 1 x a day only just in front of and behind the anal area  Use the valisone ointment 2 x a week to the vulva  Use sitz baths 3 x a day  Don't wear underwear or any tight clothing  Use A&D ointment a few times a day to keep the perianal area covered.

## 2018-09-18 NOTE — Addendum Note (Signed)
Addended by: Dorothy Spark on: 09/18/2018 10:10 AM   Modules accepted: Orders

## 2018-09-19 LAB — VAGINITIS/VAGINOSIS, DNA PROBE
Candida Species: NEGATIVE
Gardnerella vaginalis: NEGATIVE
Trichomonas vaginosis: NEGATIVE

## 2018-09-24 ENCOUNTER — Encounter: Payer: Self-pay | Admitting: Obstetrics and Gynecology

## 2018-09-24 ENCOUNTER — Ambulatory Visit: Payer: Medicare Other | Admitting: Obstetrics and Gynecology

## 2018-09-24 ENCOUNTER — Other Ambulatory Visit: Payer: Self-pay

## 2018-09-24 ENCOUNTER — Telehealth: Payer: Self-pay | Admitting: Obstetrics and Gynecology

## 2018-09-24 VITALS — BP 140/86 | HR 72 | Wt 187.2 lb

## 2018-09-24 DIAGNOSIS — L309 Dermatitis, unspecified: Secondary | ICD-10-CM

## 2018-09-24 DIAGNOSIS — L900 Lichen sclerosus et atrophicus: Secondary | ICD-10-CM | POA: Diagnosis not present

## 2018-09-24 DIAGNOSIS — L98491 Non-pressure chronic ulcer of skin of other sites limited to breakdown of skin: Secondary | ICD-10-CM

## 2018-09-24 NOTE — Progress Notes (Signed)
GYNECOLOGY  VISIT   HPI: 73 y.o.   Widowed White or Caucasian Not Hispanic or Latino  female   [phone] with Patient's last menstrual period was 08/14/1985 (approximate).   here for recheck of ulcer of perianal area and perianal dermatitis.    She is slowly getting better. Biopsy was benign, inflammation. She is mainly sore where the ulcer is. Not itchy. The rest of the skin is getting better.    GYNECOLOGIC HISTORY: Patient's last menstrual period was 08/14/1985 (approximate). Contraception: Postmenopausal Menopausal hormone therapy: None        OB History    Gravida  6   Para  1   Term  1   Preterm  0   AB  5   Living  1     SAB  5   TAB  0   Ectopic  0   Multiple  0   Live Births  1              Patient Active Problem List   Diagnosis Date Noted  . Status post foot surgery 06/27/2013  . Other hammer toe (acquired) 06/10/2013  . Pain in foot 06/10/2013  . Onychomycosis 06/10/2013    Past Medical History:  Diagnosis Date  . COPD (chronic obstructive pulmonary disease) ([place])   . Glaucoma   . History of recurrent UTIs   . Hyperlipidemia   . Hypertension   . Lichen sclerosus January 2017   vulva/perianal region  . Non-melanoma skin cancer 08/2015   right leg  . Osteopenia   . Osteoporosis   . Post-menopausal      Past Surgical History:  Procedure Laterality Date  . FOOT SURGERY Right 06/2013   right 2nd toe  . HEMATOMA EVACUATION Left 2009   left calf    Current Outpatient Medications  Medication Sig Dispense Refill  . albuterol (PROAIR HFA) 108 (90 BASE) MCG/ACT inhaler Inhale 2 puffs into the lungs every 6 (six) hours as needed for wheezing or shortness of breath.    Marland Kitchen aspirin 81 MG tablet Take 81 mg by mouth daily.    Marland Kitchen atorvastatin (LIPITOR) 20 MG tablet Take 1 tablet by mouth at bedtime.  11  . betamethasone valerate ointment (VALISONE) 0.1 % Apply a pea sized amount 3 x a week 30 g 3  . budesonide-formoterol (SYMBICORT) 160-4.5  MCG/ACT inhaler Inhale 2 puffs into the lungs every morning.    . calcium carbonate (OS-CAL) 600 MG TABS tablet Take 600 mg by mouth 2 (two) times daily with a meal.    . Cholecalciferol (VITAMIN D-3) 1000 UNITS CAPS Take by mouth daily.    . clindamycin (CLEOCIN) 2 % vaginal cream INSERT ONE APPLICATORFUL BY VAGINAL ROUTE EVERY DAY AT BEDTIME  0  . clobetasol ointment (TEMOVATE) 3.53 % Apply 1 application topically 2 (two) times daily. Apply a small amount to the perianal area twice daily for 2 weeks 30 g 0  . Coenzyme Q10 (COQ-10 PO) Take by mouth.    . diclofenac sodium (VOLTAREN) 1 % GEL Apply topically 4 (four) times daily.    Marland Kitchen latanoprost (XALATAN) 0.005 % ophthalmic solution Place 1 drop into both eyes at bedtime.    . lidocaine (XYLOCAINE) 5 % ointment Apply 1 application topically 4 (four) times daily as needed. 30 g 0  . Multiple Vitamins-Minerals (CENTRUM SILVER ADULT 50+ PO) Take by mouth daily.    . Omega-3 Fatty Acids (FISH OIL) 1000 MG CAPS Take 1 capsule by mouth daily.    Marland Kitchen  oxybutynin (DITROPAN) 5 MG tablet Take 5 mg by mouth 3 (three) times daily.    . vitamin E 400 UNIT capsule Take 400 Units by mouth daily.    Marland Kitchen losartan (COZAAR) 100 MG tablet      No current facility-administered medications for this visit.      ALLERGIES: Contrast media [iodinated diagnostic agents]; Ether; and Iohexol  Family History  Problem Relation Age of Onset  . Breast cancer Mother        deceased -34's  . Heart attack Father   . Heart attack Brother     Social History   Socioeconomic History  . Marital status: Widowed    Spouse name: Not on file  . Number of children: Not on file  . Years of education: Not on file  . Highest education level: Not on file  Occupational History  . Not on file  Social Needs  . Financial resource strain: Not on file  . Food insecurity:    Worry: Not on file    Inability: Not on file  . Transportation needs:    Medical: Not on file    Non-medical:  Not on file  Tobacco Use  . Smoking status: Never Smoker  . Smokeless tobacco: Never Used  Substance and Sexual Activity  . Alcohol use: Not Currently  . Drug use: No  . Sexual activity: Not Currently    Birth control/protection: Post-menopausal  Lifestyle  . Physical activity:    Days per week: Not on file    Minutes per session: Not on file  . Stress: Not on file  Relationships  . Social connections:    Talks on phone: Not on file    Gets together: Not on file    Attends religious service: Not on file    Active member of club or organization: Not on file    Attends meetings of clubs or organizations: Not on file    Relationship status: Not on file  . Intimate partner violence:    Fear of current or ex partner: Not on file    Emotionally abused: Not on file    Physically abused: Not on file    Forced sexual activity: Not on file  Other Topics Concern  . Not on file  Social History Narrative  . Not on file    Review of Systems  Constitutional: Negative.   HENT: Negative.   Eyes: Negative.   Respiratory: Negative.   Cardiovascular: Negative.   Gastrointestinal: Negative.   Genitourinary:       Dermatitis Perianal pain Perianal ulcer  Musculoskeletal: Negative.   Skin: Negative.   Neurological: Negative.   Endo/Heme/Allergies: Negative.   Psychiatric/Behavioral: Negative.     PHYSICAL EXAMINATION:    BP 140/86 (BP Location: Right Arm, Patient Position: Sitting, Cuff Size: Normal)   Pulse 72   Wt 187 lb 3.2 oz (84.9 kg)   LMP 08/14/1985 (Approximate)   BMI 31.15 kg/m     General appearance: alert, cooperative and appears stated age  Pelvic: External genitalia: whitening of the vulva with some loss of architecture.               Urethra:  normal appearing urethra with no masses, tenderness or lesions              Bartholins and Skenes: normal     Perianal region: ulceration on the right is slowly healing. The other skin with marked improvement in whitening  and erythema. There are still some fissures  at the base (anterior and posterior to her anus)                Chaperone was present for exam.  ASSESSMENT Lichen sclerosis stable Severe perianal dermatitis, slowly healing    PLAN Continue with A&D ointment Use steroid ointment 2 x a week Shower after BM Don't over wipe the area Aware of vulvar/perianal skin care F/U in 2 weeks   An After Visit Summary was printed and given to the patient.

## 2018-09-24 NOTE — Telephone Encounter (Signed)
Patient is asking for her recent biopsy results.

## 2018-09-24 NOTE — Telephone Encounter (Signed)
Spoke with patient, advised ad seen below per Dr. Talbert Nan. Questions answered. Patient verbalizes understanding.   Encounter closed.

## 2018-09-24 NOTE — Telephone Encounter (Signed)
Left message to call Sharee Pimple, RN at [facility].     Patient seen in office today. Results reviewed with patient via phone on 09/20/18.  Notes recorded by Salvadore Dom, MD on 09/20/2018 at 9:43 AM EST Please let the patient know that her vaginitis panel was negative for infection and the biopsy just showed inflammation. See if she is feeling any better (she had a very severe perianal dermatitis with ulceration.

## 2018-09-24 NOTE — Telephone Encounter (Signed)
Patient left voicemail over lunch returning call to [place].

## 2018-10-02 NOTE — Progress Notes (Signed)
GYNECOLOGY  VISIT   HPI: 73 y.o.   Widowed White or Caucasian Not Hispanic or Latino  female   [phone] with Patient's last menstrual period was 08/14/1985 (approximate).   here for f/u ulcer of perianal area and perianal dermatitis. She has lichen sclerosis. She is still sore, slowly healing.  She had some bleeding from the perianal area earlier this week.  The small lump on her left labia majora is sore.     GYNECOLOGIC HISTORY: Patient's last menstrual period was 08/14/1985 (approximate). Contraception:Postmenopausal Menopausal hormone therapy: None        OB History    Gravida  6   Para  1   Term  1   Preterm  0   AB  5   Living  1     SAB  5   TAB  0   Ectopic  0   Multiple  0   Live Births  1              Patient Active Problem List   Diagnosis Date Noted  . Status post foot surgery 06/27/2013  . Other hammer toe (acquired) 06/10/2013  . Pain in foot 06/10/2013  . Onychomycosis 06/10/2013    Past Medical History:  Diagnosis Date  . COPD (chronic obstructive pulmonary disease) ([place])   . Glaucoma   . History of recurrent UTIs   . Hyperlipidemia   . Hypertension   . Lichen sclerosus January 2017   vulva/perianal region  . Non-melanoma skin cancer 08/2015   right leg  . Osteopenia   . Osteoporosis   . Post-menopausal      Past Surgical History:  Procedure Laterality Date  . FOOT SURGERY Right 06/2013   right 2nd toe  . HEMATOMA EVACUATION Left 2009   left calf    Current Outpatient Medications  Medication Sig Dispense Refill  . albuterol (PROAIR HFA) 108 (90 BASE) MCG/ACT inhaler Inhale 2 puffs into the lungs every 6 (six) hours as needed for wheezing or shortness of breath.    Marland Kitchen aspirin 81 MG tablet Take 81 mg by mouth daily.    Marland Kitchen atorvastatin (LIPITOR) 20 MG tablet Take 1 tablet by mouth at bedtime.  11  . betamethasone valerate ointment (VALISONE) 0.1 % Apply a pea sized amount 3 x a week 30 g 3  . budesonide-formoterol (SYMBICORT)  160-4.5 MCG/ACT inhaler Inhale 2 puffs into the lungs every morning.    . calcium carbonate (OS-CAL) 600 MG TABS tablet Take 600 mg by mouth 2 (two) times daily with a meal.    . Cholecalciferol (VITAMIN D-3) 1000 UNITS CAPS Take by mouth daily.    . clobetasol ointment (TEMOVATE) 9.51 % Apply 1 application topically 2 (two) times daily. Apply a small amount to the perianal area twice daily for 2 weeks 30 g 0  . Coenzyme Q10 (COQ-10 PO) Take by mouth.    . diclofenac sodium (VOLTAREN) 1 % GEL Apply topically 4 (four) times daily.    Marland Kitchen latanoprost (XALATAN) 0.005 % ophthalmic solution Place 1 drop into both eyes at bedtime.    . lidocaine (XYLOCAINE) 5 % ointment Apply 1 application topically 4 (four) times daily as needed. 30 g 0  . Multiple Vitamins-Minerals (CENTRUM SILVER ADULT 50+ PO) Take by mouth daily.    . Omega-3 Fatty Acids (FISH OIL) 1000 MG CAPS Take 1 capsule by mouth daily.    Marland Kitchen oxybutynin (DITROPAN) 5 MG tablet Take 5 mg by mouth 3 (three) times daily.    Marland Kitchen  vitamin E 400 UNIT capsule Take 400 Units by mouth daily.    . clindamycin (CLEOCIN) 2 % vaginal cream INSERT ONE APPLICATORFUL BY VAGINAL ROUTE EVERY DAY AT BEDTIME  0  . losartan (COZAAR) 100 MG tablet      No current facility-administered medications for this visit.      ALLERGIES: Contrast media [iodinated diagnostic agents]; Ether; and Iohexol  Family History  Problem Relation Age of Onset  . Breast cancer Mother        deceased -27's  . Heart attack Father   . Heart attack Brother     Social History   Socioeconomic History  . Marital status: Widowed    Spouse name: Not on file  . Number of children: Not on file  . Years of education: Not on file  . Highest education level: Not on file  Occupational History  . Not on file  Social Needs  . Financial resource strain: Not on file  . Food insecurity:    Worry: Not on file    Inability: Not on file  . Transportation needs:    Medical: Not on file     Non-medical: Not on file  Tobacco Use  . Smoking status: Never Smoker  . Smokeless tobacco: Never Used  Substance and Sexual Activity  . Alcohol use: Not Currently  . Drug use: No  . Sexual activity: Not Currently    Birth control/protection: Post-menopausal  Lifestyle  . Physical activity:    Days per week: Not on file    Minutes per session: Not on file  . Stress: Not on file  Relationships  . Social connections:    Talks on phone: Not on file    Gets together: Not on file    Attends religious service: Not on file    Active member of club or organization: Not on file    Attends meetings of clubs or organizations: Not on file    Relationship status: Not on file  . Intimate partner violence:    Fear of current or ex partner: Not on file    Emotionally abused: Not on file    Physically abused: Not on file    Forced sexual activity: Not on file  Other Topics Concern  . Not on file  Social History Narrative  . Not on file    Review of Systems  Constitutional: Negative.   HENT: Negative.   Eyes: Negative.   Respiratory: Negative.   Cardiovascular: Negative.   Gastrointestinal: Positive for constipation.  Genitourinary: Negative.        Perianal irritation  Musculoskeletal: Positive for back pain.  Skin: Negative.   Neurological: Negative.   Endo/Heme/Allergies: Negative.   Psychiatric/Behavioral: Negative.     PHYSICAL EXAMINATION:    BP 140/80 (BP Location: Right Arm, Patient Position: Sitting, Cuff Size: Normal)   Pulse 64   Wt 193 lb 3.2 oz (87.6 kg)   LMP 08/14/1985 (Approximate)   BMI 32.15 kg/m     General appearance: alert, cooperative and appears stated age  Pelvic: External genitalia:  Whitening, mild agglutination. Small lump on the right labia majora, white.               Urethra:  normal appearing urethra with no masses, tenderness or lesions              Bartholins and Skenes: normal                 Perianal dermatitis, improving, ulcer is  healing well, stitch removed  Chaperone was present for exam.  ASSESSMENT Lichen sclerosis is stable Vulvar lesion,     PLAN Continue with 2 x a week steroid ointment and daily A&D ointment Reviewed vulvar skin care Return for vulvar biopsy and f/u in 2 weeks   An After Visit Summary was printed and given to the patient.  ~15 minutes face to face time of which over 50% was spent in counseling.

## 2018-10-08 ENCOUNTER — Ambulatory Visit: Payer: Medicare Other | Admitting: Obstetrics and Gynecology

## 2018-10-08 ENCOUNTER — Other Ambulatory Visit: Payer: Self-pay

## 2018-10-08 ENCOUNTER — Encounter: Payer: Self-pay | Admitting: Obstetrics and Gynecology

## 2018-10-08 VITALS — BP 140/80 | HR 64 | Wt 193.2 lb

## 2018-10-08 DIAGNOSIS — N9089 Other specified noninflammatory disorders of vulva and perineum: Secondary | ICD-10-CM

## 2018-10-08 DIAGNOSIS — L900 Lichen sclerosus et atrophicus: Secondary | ICD-10-CM | POA: Diagnosis not present

## 2018-10-08 DIAGNOSIS — L309 Dermatitis, unspecified: Secondary | ICD-10-CM

## 2018-10-21 NOTE — Progress Notes (Signed)
GYNECOLOGY  VISIT   HPI: 73 y.o.   Widowed White or Caucasian Not Hispanic or Latino  female   [phone] with Patient's last menstrual period was 08/14/1985 (approximate).   here for recheck of perianal dermatitis, lichen sclerosis and vulvar biopsy.   Her bottom is feeling much better. She is using the steroid ointment 2 x a week and the A&D ointment in between.   GYNECOLOGIC HISTORY: Patient's last menstrual period was 08/14/1985 (approximate). Contraception: Postmenopausal Menopausal hormone therapy: None        OB History    Gravida  6   Para  1   Term  1   Preterm  0   AB  5   Living  1     SAB  5   TAB  0   Ectopic  0   Multiple  0   Live Births  1              Patient Active Problem List   Diagnosis Date Noted  . Status post foot surgery 06/27/2013  . Other hammer toe (acquired) 06/10/2013  . Pain in foot 06/10/2013  . Onychomycosis 06/10/2013    Past Medical History:  Diagnosis Date  . COPD (chronic obstructive pulmonary disease) ([place])   . Glaucoma   . History of recurrent UTIs   . Hyperlipidemia   . Hypertension   . Lichen sclerosus January 2017   vulva/perianal region  . Non-melanoma skin cancer 08/2015   right leg  . Osteopenia   . Osteoporosis   . Post-menopausal      Past Surgical History:  Procedure Laterality Date  . FOOT SURGERY Right 06/2013   right 2nd toe  . HEMATOMA EVACUATION Left 2009   left calf    Current Outpatient Medications  Medication Sig Dispense Refill  . albuterol (PROAIR HFA) 108 (90 BASE) MCG/ACT inhaler Inhale 2 puffs into the lungs every 6 (six) hours as needed for wheezing or shortness of breath.    Marland Kitchen aspirin 81 MG tablet Take 81 mg by mouth daily.    Marland Kitchen atorvastatin (LIPITOR) 20 MG tablet Take 1 tablet by mouth at bedtime.  11  . betamethasone valerate ointment (VALISONE) 0.1 % Apply a pea sized amount 3 x a week 30 g 3  . budesonide-formoterol (SYMBICORT) 160-4.5 MCG/ACT inhaler Inhale 2 puffs into the  lungs every morning.    . calcium carbonate (OS-CAL) 600 MG TABS tablet Take 600 mg by mouth 2 (two) times daily with a meal.    . Cholecalciferol (VITAMIN D-3) 1000 UNITS CAPS Take by mouth daily.    . clindamycin (CLEOCIN) 2 % vaginal cream INSERT ONE APPLICATORFUL BY VAGINAL ROUTE EVERY DAY AT BEDTIME  0  . clobetasol ointment (TEMOVATE) 7.61 % Apply 1 application topically 2 (two) times daily. Apply a small amount to the perianal area twice daily for 2 weeks 30 g 0  . Coenzyme Q10 (COQ-10 PO) Take by mouth.    . diclofenac sodium (VOLTAREN) 1 % GEL Apply topically 4 (four) times daily.    Marland Kitchen latanoprost (XALATAN) 0.005 % ophthalmic solution Place 1 drop into both eyes at bedtime.    . lidocaine (XYLOCAINE) 5 % ointment Apply 1 application topically 4 (four) times daily as needed. 30 g 0  . losartan (COZAAR) 100 MG tablet     . Multiple Vitamins-Minerals (CENTRUM SILVER ADULT 50+ PO) Take by mouth daily.    . Omega-3 Fatty Acids (FISH OIL) 1000 MG CAPS Take 1 capsule  by mouth daily.    Marland Kitchen oxybutynin (DITROPAN) 5 MG tablet Take 5 mg by mouth 3 (three) times daily.    . vitamin E 400 UNIT capsule Take 400 Units by mouth daily.     No current facility-administered medications for this visit.      ALLERGIES: Contrast media [iodinated diagnostic agents]; Ether; and Iohexol  Family History  Problem Relation Age of Onset  . Breast cancer Mother        deceased -6's  . Heart attack Father   . Heart attack Brother     Social History   Socioeconomic History  . Marital status: Widowed    Spouse name: Not on file  . Number of children: Not on file  . Years of education: Not on file  . Highest education level: Not on file  Occupational History  . Not on file  Social Needs  . Financial resource strain: Not on file  . Food insecurity:    Worry: Not on file    Inability: Not on file  . Transportation needs:    Medical: Not on file    Non-medical: Not on file  Tobacco Use  . Smoking  status: Never Smoker  . Smokeless tobacco: Never Used  Substance and Sexual Activity  . Alcohol use: Not Currently  . Drug use: No  . Sexual activity: Not Currently    Birth control/protection: Post-menopausal  Lifestyle  . Physical activity:    Days per week: Not on file    Minutes per session: Not on file  . Stress: Not on file  Relationships  . Social connections:    Talks on phone: Not on file    Gets together: Not on file    Attends religious service: Not on file    Active member of club or organization: Not on file    Attends meetings of clubs or organizations: Not on file    Relationship status: Not on file  . Intimate partner violence:    Fear of current or ex partner: Not on file    Emotionally abused: Not on file    Physically abused: Not on file    Forced sexual activity: Not on file  Other Topics Concern  . Not on file  Social History Narrative  . Not on file    Review of Systems  Constitutional: Negative.   HENT: Negative.   Eyes: Negative.   Respiratory: Negative.   Cardiovascular: Negative.   Gastrointestinal: Positive for diarrhea.       Rectal irritation  Genitourinary: Positive for dysuria.       Nocturia  Musculoskeletal: Negative.   Skin: Negative.   Neurological: Negative.   Endo/Heme/Allergies: Negative.   Psychiatric/Behavioral: Negative.     PHYSICAL EXAMINATION:    BP 124/82 (BP Location: Right Arm, Patient Position: Sitting, Cuff Size: Normal)   Pulse 68   Wt 190 lb 9.6 oz (86.5 kg)   LMP 08/14/1985 (Approximate)   BMI 31.72 kg/m     General appearance: alert, cooperative and appears stated age  Pelvic: External genitalia: diffuse whitening, mild agglutination. She has a raised white lesion on her left labia majora.               Urethra:  normal appearing urethra with no masses, tenderness or lesions              Bartholins and Skenes: normal                 Perianal area:  mild whitening, mild erythema, ulcer has healed, no  significant dermatitis.   The risks of the procedure were reviewed with the patient and a consent was signed. The area was cleansed with betadine and injected with 1% lidocaine. A #11 blade was used to remove the left vulvar lesion. The defect was closed with 4-0 vicryl. The patient tolerated the procedure well.    Chaperone was present for exam.  ASSESSMENT Vulvar lesion Lichen Sclerosis, stable Perianal dermatitis, improved    PLAN Vulvar biopsy done Continue with 2 x a week steroid ointment and daily A&D ointment   An After Visit Summary was printed and given to the patient.

## 2018-10-22 ENCOUNTER — Other Ambulatory Visit: Payer: Self-pay

## 2018-10-22 ENCOUNTER — Ambulatory Visit: Payer: Medicare Other | Admitting: Obstetrics and Gynecology

## 2018-10-22 ENCOUNTER — Encounter: Payer: Self-pay | Admitting: Obstetrics and Gynecology

## 2018-10-22 VITALS — BP 124/82 | HR 68 | Wt 190.6 lb

## 2018-10-22 DIAGNOSIS — L900 Lichen sclerosus et atrophicus: Secondary | ICD-10-CM | POA: Diagnosis not present

## 2018-10-22 DIAGNOSIS — L309 Dermatitis, unspecified: Secondary | ICD-10-CM | POA: Diagnosis not present

## 2018-10-22 DIAGNOSIS — N9089 Other specified noninflammatory disorders of vulva and perineum: Secondary | ICD-10-CM

## 2018-10-22 NOTE — Patient Instructions (Signed)

## 2018-10-28 ENCOUNTER — Telehealth: Payer: Self-pay

## 2018-10-28 NOTE — Telephone Encounter (Signed)
Erroneous encounter

## 2018-11-26 ENCOUNTER — Telehealth: Payer: Self-pay | Admitting: Obstetrics and Gynecology

## 2018-11-26 ENCOUNTER — Ambulatory Visit: Payer: Medicare Other | Admitting: Obstetrics and Gynecology

## 2018-11-26 NOTE — Telephone Encounter (Signed)
Patient canceled her 1 month recheck via automated reminder call. I left her a message to call and reschedule.

## 2018-12-13 ENCOUNTER — Telehealth: Payer: Self-pay | Admitting: Obstetrics and Gynecology

## 2018-12-13 MED ORDER — TROSPIUM CHLORIDE ER 60 MG PO CP24: 60.0000 mg | ORAL_CAPSULE | Freq: Every day | ORAL | 0 refills | Status: DC

## 2018-12-13 NOTE — Telephone Encounter (Signed)
Patient went to pick up her prescription and was told they were out of the trospium  prescription. She would like the prescription called to [facility] on [address].

## 2018-12-13 NOTE — Telephone Encounter (Signed)
Spoke with patient. Patient is calling for her rx for Trospium Chloride 60 mg to be transferred to [facility] as her pharmacy cannot get it in stock. This was sent in on 01/08/2018 #90 3RF. This was not in [facility] med list as it was removed by another office. Patient states that she has been taking it daily since it was prescribed without problems. Rx for Trospium Chloride 60 mg daily #90 0RF sent to pharmacy on file. Patient cancelled her follow up for vulvar lesion in April. States that she is having mild perianal discomfort again. Advised will need to be seen for follow up. Appointment schedule for 12/17/2018 at 10:30 am with Dr.Jertson. Patient is agreeable to date and time.  Routing to provider and will close encounter.

## 2018-12-16 ENCOUNTER — Other Ambulatory Visit: Payer: Self-pay

## 2018-12-16 NOTE — Progress Notes (Signed)
GYNECOLOGY  VISIT   HPI: 73 y.o.   Widowed White or Caucasian Not Hispanic or Latino  female   [phone] with Patient's last menstrual period was 08/14/1985 (approximate).   here for perianal discomfort. She has a h/o severe perianal dermatitis, vulvitis and lichen sclerosis. At her last check in 3/20 she was instructed to used A&D ointment daily and steroid ointment 2 x a week.  She c/o perineal irritation, not itching, hurting, throbbing. Hurts to sit down, can't wear underwear.  She has a h/o an anal fissure. She is taking miralax. She is having a BM every 1-2 days, she is straining to have, hurts to have a BM, some blood.   GYNECOLOGIC HISTORY: Patient's last menstrual period was 08/14/1985 (approximate). Contraception: post menopausal Menopausal hormone therapy: none        OB History    Gravida  6   Para  1   Term  1   Preterm  0   AB  5   Living  1     SAB  5   TAB  0   Ectopic  0   Multiple  0   Live Births  1              Patient Active Problem List   Diagnosis Date Noted  . Status post foot surgery 06/27/2013  . Other hammer toe (acquired) 06/10/2013  . Pain in foot 06/10/2013  . Onychomycosis 06/10/2013    Past Medical History:  Diagnosis Date  . COPD (chronic obstructive pulmonary disease) ([place])   . Glaucoma   . History of recurrent UTIs   . Hyperlipidemia   . Hypertension   . Lichen sclerosus January 2017   vulva/perianal region  . Non-melanoma skin cancer 08/2015   right leg  . Osteopenia   . Osteoporosis   . Post-menopausal      Past Surgical History:  Procedure Laterality Date  . FOOT SURGERY Right 06/2013   right 2nd toe  . HEMATOMA EVACUATION Left 2009   left calf    Current Outpatient Medications  Medication Sig Dispense Refill  . albuterol (PROAIR HFA) 108 (90 BASE) MCG/ACT inhaler Inhale 2 puffs into the lungs every 6 (six) hours as needed for wheezing or shortness of breath.    Marland Kitchen aspirin 81 MG tablet Take 81 mg by mouth  daily.    Marland Kitchen atorvastatin (LIPITOR) 20 MG tablet Take 1 tablet by mouth at bedtime.  11  . betamethasone valerate ointment (VALISONE) 0.1 % Apply a pea sized amount 3 x a week 30 g 3  . budesonide-formoterol (SYMBICORT) 160-4.5 MCG/ACT inhaler Inhale 2 puffs into the lungs every morning.    . calcium carbonate (OS-CAL) 600 MG TABS tablet Take 600 mg by mouth 2 (two) times daily with a meal.    . Cholecalciferol (VITAMIN D-3) 1000 UNITS CAPS Take by mouth daily.    . Coenzyme Q10 (COQ-10 PO) Take by mouth.    . famotidine (PEPCID) 20 MG tablet     . latanoprost (XALATAN) 0.005 % ophthalmic solution Place 1 drop into both eyes at bedtime.    . lidocaine (XYLOCAINE) 5 % ointment Apply 1 application topically 4 (four) times daily as needed. 30 g 0  . losartan (COZAAR) 100 MG tablet     . Multiple Vitamins-Minerals (CENTRUM SILVER ADULT 50+ PO) Take by mouth daily.    Marland Kitchen nystatin ointment (MYCOSTATIN) Apply 1 application topically 2 (two) times daily.    . Omega-3 Fatty Acids (  FISH OIL) 1000 MG CAPS Take 1 capsule by mouth daily.    . Trospium Chloride 60 MG CP24 Take 1 capsule (60 mg total) by mouth daily. 90 each 0  . clobetasol ointment (TEMOVATE) 3.61 % Apply 1 application topically 2 (two) times daily. Apply a small amount to the perianal area twice daily for 2 weeks (Patient not taking: Reported on 12/17/2018) 30 g 0  . diclofenac sodium (VOLTAREN) 1 % GEL Apply topically 4 (four) times daily.     No current facility-administered medications for this visit.      ALLERGIES: Contrast media [iodinated diagnostic agents]; Ether; and Iohexol  Family History  Problem Relation Age of Onset  . Breast cancer Mother        deceased -20's  . Heart attack Father   . Heart attack Brother     Social History   Socioeconomic History  . Marital status: Widowed    Spouse name: Not on file  . Number of children: Not on file  . Years of education: Not on file  . Highest education level: Not on file   Occupational History  . Not on file  Social Needs  . Financial resource strain: Not on file  . Food insecurity:    Worry: Not on file    Inability: Not on file  . Transportation needs:    Medical: Not on file    Non-medical: Not on file  Tobacco Use  . Smoking status: Never Smoker  . Smokeless tobacco: Never Used  Substance and Sexual Activity  . Alcohol use: Not Currently  . Drug use: No  . Sexual activity: Not Currently    Birth control/protection: Post-menopausal  Lifestyle  . Physical activity:    Days per week: Not on file    Minutes per session: Not on file  . Stress: Not on file  Relationships  . Social connections:    Talks on phone: Not on file    Gets together: Not on file    Attends religious service: Not on file    Active member of club or organization: Not on file    Attends meetings of clubs or organizations: Not on file    Relationship status: Not on file  . Intimate partner violence:    Fear of current or ex partner: Not on file    Emotionally abused: Not on file    Physically abused: Not on file    Forced sexual activity: Not on file  Other Topics Concern  . Not on file  Social History Narrative  . Not on file    Review of Systems  Constitutional: Negative.   HENT: Negative.   Eyes: Negative.   Respiratory: Negative.   Cardiovascular: Negative.   Gastrointestinal: Negative.   Genitourinary: Negative.   Musculoskeletal: Negative.   Skin:       Perianal discomfort  Neurological: Negative.   Endo/Heme/Allergies: Negative.   Psychiatric/Behavioral: Negative.     PHYSICAL EXAMINATION:    BP 120/78   Pulse 68   Temp 98 F (36.7 C) (Oral)   Resp 16   Wt 189 lb (85.7 kg)   LMP 08/14/1985 (Approximate)   BMI 31.45 kg/m     General appearance: alert, cooperative and appears stated age  Pelvic: External genitalia:  no lesions, diffuse whitening, mild aggulatination.              Urethra:  normal appearing urethra with no masses,  tenderness or lesions  Bartholins and Skenes: normal                 Anus:  Anal fissure at 6 o'clock  Perianal dermatitis, erythema, whitening  Chaperone was present for exam.  ASSESSMENT Anal fissure Perianal dermatitis Lichen sclerosis, stable.     PLAN Discussed the necessity of getting her stools to the right consistency Can try miralax or senna Use A&D ointment or Vaseline daily and after every BM Use steroid ointment to the perianal region 1 x a day for 2 weeks Use the steroid ointment to the vulva 2 x a week F/U in 2 weeks.    An After Visit Summary was printed and given to the patient.  Over 15 minutes face to face time of which over 50% was spent in counseling.

## 2018-12-17 ENCOUNTER — Other Ambulatory Visit: Payer: Self-pay

## 2018-12-17 ENCOUNTER — Encounter: Payer: Self-pay | Admitting: Obstetrics and Gynecology

## 2018-12-17 ENCOUNTER — Ambulatory Visit: Payer: Medicare Other | Admitting: Obstetrics and Gynecology

## 2018-12-17 VITALS — BP 120/78 | HR 68 | Temp 98.0°F | Resp 16 | Wt 189.0 lb

## 2018-12-17 DIAGNOSIS — K602 Anal fissure, unspecified: Secondary | ICD-10-CM | POA: Diagnosis not present

## 2018-12-17 DIAGNOSIS — L309 Dermatitis, unspecified: Secondary | ICD-10-CM

## 2018-12-17 NOTE — Patient Instructions (Addendum)
Try the miralax every day. If the miralax doesn't work, then change to senna 1-2 tablets up to 2 x a day. Start with one tablet every 12 hours until you have a BM.  Use Vaseline or A&D ointment after every BM and after you shower Use steroid ointment to the perianal area 1 x a day for 2 weeks  Use the steroid ointment to your vulva 2 x a week  Anal Fissure, Adult  An anal fissure is a small tear or crack in the tissue of the anus. Bleeding from a fissure usually stops on its own within a few minutes. However, bleeding will often occur again with each bowel movement until the fissure heals. What are the causes? This condition is usually caused by passing a large or hard stool (feces). Other causes include:  Constipation.  Frequent diarrhea.  Inflammatory bowel disease (Crohn's disease or ulcerative colitis).  Childbirth.  Infections.  Anal sex. What are the signs or symptoms? Symptoms of this condition include:  Bleeding from the rectum.  Small amounts of blood seen on your stool, on the toilet paper, or in the toilet after a bowel movement. The blood coats the outside of the stool and is not mixed with the stool.  Painful bowel movements.  Itching or irritation around the anus. How is this diagnosed? A health care provider may diagnose this condition by closely examining the anal area. An anal fissure can usually be seen with careful inspection. In some cases, a rectal exam may be performed, or a short tube (anoscope) may be used to examine the anal canal. How is this treated? Initial treatment for this condition may include:  Taking steps to avoid constipation. This may include making changes to your diet, such as increasing your intake of fiber or fluid.  Taking fiber supplements. These supplements can soften your stool to help make bowel movements easier. Your health care provider may also prescribe a stool softener if your stool is hard.  Taking sitz baths. This may help  to heal the tear.  Using medicated creams or ointments. These may be prescribed to lessen discomfort. Treatments that are sometimes used if initial treatments do not work well or if the condition is more severe may include:  Botulinum injection.  Surgery to repair the fissure. Follow these instructions at home: Eating and drinking   Avoid foods that may cause constipation, such as bananas, milk, and other dairy products.  Eat all fruits, except bananas.  Drink enough fluid to keep your urine pale yellow.  Eat foods that are high in fiber, such as beans, whole grains, and fresh fruits and vegetables. General instructions   Take over-the-counter and prescription medicines only as told by your health care provider.  Use creams or ointments only as told by your health care provider.  Keep the anal area clean and dry.  Take sitz baths as told by your health care provider. Do not use soap in the sitz baths.  Keep all follow-up visits as told by your health care provider. This is important. Contact a health care provider if you have:  More bleeding.  A fever.  Diarrhea that is mixed with blood.  Pain that continues.  Ongoing problems that are getting worse rather than better. Summary  An anal fissure is a small tear or crack in the tissue of the anus. This condition is usually caused by passing a large or hard stool (feces). Other causes include constipation and frequent diarrhea.  Initial treatment for  this condition may include taking steps to avoid constipation, such as increasing your intake of fiber or fluid.  Follow instructions for care as told by your health care provider.  Contact your health care provider if you have more bleeding or your problem is getting worse rather than better.  Keep all follow-up visits as told by your health care provider. This is important. This information is not intended to replace advice given to you by your health care provider.  Make sure you discuss any questions you have with your health care provider. Document Released: 07/31/2005 Document Revised: 01/10/2018 Document Reviewed: 01/10/2018 Elsevier Interactive Patient Education  2019 [employer].

## 2018-12-19 ENCOUNTER — Telehealth: Payer: Self-pay | Admitting: *Deleted

## 2018-12-19 NOTE — Telephone Encounter (Signed)
PA submitted for Trospium Chloride 60 mg caps to plan via [email or web address]  KeyDarcella Cheshire - PA Case ID: [id number] - Rx #: [id number]

## 2018-12-26 NOTE — Telephone Encounter (Signed)
PA was denied stating that the patient must try two covered alternatives before they are able to covering Trospuim Chloride. Alternatives are Myrbetriq, Oxybutynin ER, and Solifenacin.  Routing to Dr.Silva for review.

## 2018-12-26 NOTE — Telephone Encounter (Signed)
Patient is calling regarding her medication and making a change dur to not being covered by her insurance. She states  "it is a bladder medication".

## 2018-12-26 NOTE — Telephone Encounter (Signed)
Will have Dr. Talbert Nan review and make recommendation.   Cc- Dr. Talbert Nan

## 2018-12-27 ENCOUNTER — Other Ambulatory Visit: Payer: Self-pay

## 2018-12-27 ENCOUNTER — Encounter: Payer: Self-pay | Admitting: Obstetrics and Gynecology

## 2018-12-27 ENCOUNTER — Ambulatory Visit (INDEPENDENT_AMBULATORY_CARE_PROVIDER_SITE_OTHER): Payer: Medicare Other | Admitting: Obstetrics and Gynecology

## 2018-12-27 VITALS — BP 128/62 | HR 72 | Temp 98.2°F | Ht 65.0 in | Wt 191.0 lb

## 2018-12-27 DIAGNOSIS — R300 Dysuria: Secondary | ICD-10-CM

## 2018-12-27 DIAGNOSIS — N763 Subacute and chronic vulvitis: Secondary | ICD-10-CM

## 2018-12-27 LAB — POCT URINALYSIS DIPSTICK
Bilirubin, UA: NEGATIVE
Blood, UA: NEGATIVE
Glucose, UA: NEGATIVE
Ketones, UA: NEGATIVE
Leukocytes, UA: NEGATIVE
Nitrite, UA: NEGATIVE
Protein, UA: NEGATIVE
Urobilinogen, UA: 0.2 E.U./dL
pH, UA: 7 (ref 5.0–8.0)

## 2018-12-27 MED ORDER — ESTROGENS, CONJUGATED 0.625 MG/GM VA CREA: TOPICAL_CREAM | VAGINAL | 1 refills | Status: DC

## 2018-12-27 MED ORDER — FLUCONAZOLE 150 MG PO TABS: 150.0000 mg | ORAL_TABLET | Freq: Once | ORAL | 0 refills | Status: AC

## 2018-12-27 NOTE — Telephone Encounter (Signed)
Spoke with patient regarding medication. Advised will review with Dr.Jertson. Patient states that she is having severe vaginal irritation and pain when she uses the restroom. Reports that her vaginal tissue is very red and inflamed. Advised will need to be seen in the office for further evaluation. Appointment scheduled for today at 2 pm with Dr.Silva. Patient is agreeable to date, time, and to see another provider.

## 2018-12-27 NOTE — Telephone Encounter (Signed)
I am happy to see the patient today.

## 2018-12-27 NOTE — Progress Notes (Signed)
GYNECOLOGY  VISIT   HPI: 73 y.o.   Widowed  Caucasian  female   [phone] with Patient's last menstrual period was 08/14/1985 (approximate).   here for dysuria, vaginal pain x 2 days. Patient has chronic vulvitis and lichen sclerosus.   States pain for 4 days.  States pain is in the urethral area and states it looks red.  Does not used underwear or pants.  Reporting pain when the urine hits the outside.  States she takes a "purple pill" from urology when she drinks tea.  States it did not help her pain currently.  She denies vaginal itching or discharge.   She does not use any new soaps or lotions.  She uses vegetable soap but not on her vulva.  No change in detergent.  She uses baking soda in her laundry detergent.  Also using Arm and [employer] unscented.   Using A and D ointment when she is not using Valisone ointment.   She states hx of glaucoma, but she is uncertain what type she has.  She is unable to take Trospium for her overactive bladder as it is not covered by ins.  She has vaginal estrogen cream at home and has not used it in about one year.   Urine dip - negative.   GYNECOLOGIC HISTORY: Patient's last menstrual period was 08/14/1985 (approximate). Contraception:  PMP Menopausal hormone therapy:  none Last mammogram:  04/29/18 BIRADS1:Neg  Last pap smear:   07/28/16 neg         OB History    Gravida  6   Para  1   Term  1   Preterm  0   AB  5   Living  1     SAB  5   TAB  0   Ectopic  0   Multiple  0   Live Births  1              Patient Active Problem List   Diagnosis Date Noted  . Status post foot surgery 06/27/2013  . Other hammer toe (acquired) 06/10/2013  . Pain in foot 06/10/2013  . Onychomycosis 06/10/2013    Past Medical History:  Diagnosis Date  . COPD (chronic obstructive pulmonary disease) ([place])   . Glaucoma   . History of recurrent UTIs   . Hyperlipidemia   . Hypertension   . Lichen sclerosus January 2017    vulva/perianal region  . Non-melanoma skin cancer 08/2015   right leg  . Osteopenia   . Osteoporosis   . Post-menopausal      Past Surgical History:  Procedure Laterality Date  . FOOT SURGERY Right 06/2013   right 2nd toe  . HEMATOMA EVACUATION Left 2007   left calf - excision of hemangioma with thrombus    Current Outpatient Medications  Medication Sig Dispense Refill  . albuterol (PROAIR HFA) 108 (90 BASE) MCG/ACT inhaler Inhale 2 puffs into the lungs every 6 (six) hours as needed for wheezing or shortness of breath.    Marland Kitchen aspirin 81 MG tablet Take 81 mg by mouth daily.    Marland Kitchen atorvastatin (LIPITOR) 20 MG tablet Take 1 tablet by mouth at bedtime.  11  . betamethasone valerate ointment (VALISONE) 0.1 % Apply a pea sized amount 3 x a week 30 g 3  . budesonide-formoterol (SYMBICORT) 160-4.5 MCG/ACT inhaler Inhale 2 puffs into the lungs every morning.    . calcium carbonate (OS-CAL) 600 MG TABS tablet Take 600 mg by mouth 2 (two) times  daily with a meal.    . Cholecalciferol (VITAMIN D-3) 1000 UNITS CAPS Take by mouth daily.    . Coenzyme Q10 (COQ-10 PO) Take by mouth.    . diclofenac sodium (VOLTAREN) 1 % GEL Apply topically 4 (four) times daily.    . famotidine (PEPCID) 20 MG tablet     . latanoprost (XALATAN) 0.005 % ophthalmic solution Place 1 drop into both eyes at bedtime.    . lidocaine (XYLOCAINE) 5 % ointment Apply 1 application topically 4 (four) times daily as needed. 30 g 0  . losartan (COZAAR) 100 MG tablet     . Misc Natural Products (FOCUSED MIND PO) Take by mouth daily.    . Multiple Vitamins-Minerals (CENTRUM SILVER ADULT 50+ PO) Take by mouth daily.    Marland Kitchen nystatin ointment (MYCOSTATIN) Apply 1 application topically 2 (two) times daily.    . Omega-3 Fatty Acids (FISH OIL) 1000 MG CAPS Take 1 capsule by mouth daily.    . Trospium Chloride 60 MG CP24 Take 1 capsule (60 mg total) by mouth daily. 90 each 0  . vitamin C (ASCORBIC ACID) 250 MG tablet Take 250 mg by mouth  daily.    . clobetasol ointment (TEMOVATE) 4.09 % Apply 1 application topically 2 (two) times daily. Apply a small amount to the perianal area twice daily for 2 weeks (Patient not taking: Reported on 12/17/2018) 30 g 0   No current facility-administered medications for this visit.      ALLERGIES: Contrast media [iodinated diagnostic agents]; Ether; and Iohexol  Family History  Problem Relation Age of Onset  . Breast cancer Mother        deceased -42's  . Heart attack Father   . Heart attack Brother     Social History   Socioeconomic History  . Marital status: Widowed    Spouse name: Not on file  . Number of children: Not on file  . Years of education: Not on file  . Highest education level: Not on file  Occupational History  . Not on file  Social Needs  . Financial resource strain: Not on file  . Food insecurity:    Worry: Not on file    Inability: Not on file  . Transportation needs:    Medical: Not on file    Non-medical: Not on file  Tobacco Use  . Smoking status: Never Smoker  . Smokeless tobacco: Never Used  Substance and Sexual Activity  . Alcohol use: Not Currently  . Drug use: No  . Sexual activity: Not Currently    Birth control/protection: Post-menopausal  Lifestyle  . Physical activity:    Days per week: Not on file    Minutes per session: Not on file  . Stress: Not on file  Relationships  . Social connections:    Talks on phone: Not on file    Gets together: Not on file    Attends religious service: Not on file    Active member of club or organization: Not on file    Attends meetings of clubs or organizations: Not on file    Relationship status: Not on file  . Intimate partner violence:    Fear of current or ex partner: Not on file    Emotionally abused: Not on file    Physically abused: Not on file    Forced sexual activity: Not on file  Other Topics Concern  . Not on file  Social History Narrative  . Not on file  Review of Systems   Genitourinary: Positive for dysuria and vaginal pain.  All other systems reviewed and are negative.   PHYSICAL EXAMINATION:    BP 128/62   Pulse 72   Temp 98.2 F (36.8 C)   Ht 5\' 5"  (1.651 m)   Wt 191 lb (86.6 kg)   LMP 08/14/1985 (Approximate)   BMI 31.78 kg/m     General appearance: alert, cooperative and appears stated age  Pelvic: External genitalia:  Erythema of the vulva with ecchymoses of the labia minora.  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  This almost looks recent.              Urethra:  normal appearing urethra with no masses, tenderness or lesions              Bartholins and Skenes: normal                 Vagina: normal appearing vagina with normal color and discharge, no lesions              Cervix: no lesions                Bimanual Exam:  Uterus:  normal size, contour, position, consistency, mobility, non-tender              Adnexa: no mass, fullness, tenderness           Anus:  Hypopigmentation of the perianal region.   Chaperone was present for exam.  ASSESSMENT  Chronic vulvitis.  Lichen sclerosus.  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  I suspect she may have a component of yeast infection also.  Hx left calf mass, which was hemangioma with thrombus on chart review.  She had surgical excision many years ago.  PLAN  Affirm.  Diflucan course. Premarin cream to vulva 1/2 gram to vulva at hs x 2 weeks and then 1/2 gram to vulva twice weekly. Coupon given. Stop using valisone currently.  FU in 2 weeks.   An After Visit Summary was printed and given to the patient.  __25____ minutes face to face time of which over 50% was spent in counseling.

## 2018-12-28 LAB — URINALYSIS, MICROSCOPIC ONLY
Bacteria, UA: NONE SEEN
Casts: NONE SEEN /lpf

## 2018-12-28 LAB — VAGINITIS/VAGINOSIS, DNA PROBE
Candida Species: NEGATIVE
Gardnerella vaginalis: NEGATIVE
Trichomonas vaginosis: NEGATIVE

## 2018-12-29 LAB — URINE CULTURE

## 2018-12-31 NOTE — Telephone Encounter (Signed)
She can't take oxybutin or vesicare because of her glaucoma. She can try myrbetriq 25 mg po qd, f/u in one month.

## 2018-12-31 NOTE — Telephone Encounter (Signed)
Routing to Dr. Talbert Nan to advise on OAB alternative medication. See alternatives listed below.

## 2019-01-01 ENCOUNTER — Ambulatory Visit: Payer: Medicare Other | Admitting: Obstetrics and Gynecology

## 2019-01-01 MED ORDER — MIRABEGRON ER 25 MG PO TB24: 25.0000 mg | ORAL_TABLET | Freq: Every day | ORAL | 0 refills | Status: DC

## 2019-01-01 NOTE — Telephone Encounter (Signed)
Left message to call Kaitlyn at [phone]. 

## 2019-01-01 NOTE — Telephone Encounter (Signed)
Patient returning call.

## 2019-01-01 NOTE — Telephone Encounter (Signed)
Spoke with patient, advised as seen below per Dr. Talbert Nan. Rx for Myrbetriq 25 mg sent to [facility] as requested by patient. F/u scheduled for 6/16 at 2:15pm with Dr. Talbert Nan. Patient verbalizes understanding and is agreeable.   Encounter closed.

## 2019-01-09 ENCOUNTER — Ambulatory Visit: Payer: Medicare HMO | Admitting: Orthopedic Surgery

## 2019-01-13 ENCOUNTER — Ambulatory Visit: Payer: Medicare Other | Admitting: Obstetrics and Gynecology

## 2019-01-13 ENCOUNTER — Other Ambulatory Visit: Payer: Self-pay

## 2019-01-13 ENCOUNTER — Ambulatory Visit (INDEPENDENT_AMBULATORY_CARE_PROVIDER_SITE_OTHER): Payer: Medicare Other | Admitting: Obstetrics and Gynecology

## 2019-01-13 ENCOUNTER — Encounter: Payer: Self-pay | Admitting: Obstetrics and Gynecology

## 2019-01-13 VITALS — BP 126/60 | HR 64 | Temp 97.9°F | Wt 189.2 lb

## 2019-01-13 DIAGNOSIS — L309 Dermatitis, unspecified: Secondary | ICD-10-CM

## 2019-01-13 DIAGNOSIS — K602 Anal fissure, unspecified: Secondary | ICD-10-CM | POA: Diagnosis not present

## 2019-01-13 DIAGNOSIS — N763 Subacute and chronic vulvitis: Secondary | ICD-10-CM

## 2019-01-13 DIAGNOSIS — L900 Lichen sclerosus et atrophicus: Secondary | ICD-10-CM

## 2019-01-13 NOTE — Progress Notes (Signed)
GYNECOLOGY  VISIT   HPI: 73 y.o.   Widowed White or Caucasian Not Hispanic or Latino  female   [phone] with Patient's last menstrual period was 08/14/1985 (approximate).   here for 2 week recheck appointment. Reports vaginal irritation and pain. Also having rectal pain. The patient has lichen sclerosis, issues with chronic vulvitis and perianal dermatitis, and an anal fissure.  Her anal fissure got better, then got worse and is a little better again. She has found that eating spring mix is helping. Today has been a good day for that. She has miralax. The perianal dermatitis has been sore this last week. Her right vulva is sore.   GYNECOLOGIC HISTORY: Patient's last menstrual period was 08/14/1985 (approximate). Contraception: Postmenopausal Menopausal hormone therapy: Estrogen vaginal cream        OB History    Gravida  6   Para  1   Term  1   Preterm  0   AB  5   Living  1     SAB  5   TAB  0   Ectopic  0   Multiple  0   Live Births  1              Patient Active Problem List   Diagnosis Date Noted  . Status post foot surgery 06/27/2013  . Other hammer toe (acquired) 06/10/2013  . Pain in foot 06/10/2013  . Onychomycosis 06/10/2013    Past Medical History:  Diagnosis Date  . COPD (chronic obstructive pulmonary disease) ([place])   . Glaucoma   . History of recurrent UTIs   . Hyperlipidemia   . Hypertension   . Lichen sclerosus January 2017   vulva/perianal region  . Non-melanoma skin cancer 08/2015   right leg  . Osteopenia   . Osteoporosis   . Post-menopausal      Past Surgical History:  Procedure Laterality Date  . FOOT SURGERY Right 06/2013   right 2nd toe  . HEMATOMA EVACUATION Left 2007   left calf - excision of hemangioma with thrombus    Current Outpatient Medications  Medication Sig Dispense Refill  . albuterol (PROAIR HFA) 108 (90 BASE) MCG/ACT inhaler Inhale 2 puffs into the lungs every 6 (six) hours as needed for wheezing or  shortness of breath.    Marland Kitchen aspirin 81 MG tablet Take 81 mg by mouth daily.    Marland Kitchen atorvastatin (LIPITOR) 20 MG tablet Take 1 tablet by mouth at bedtime.  11  . betamethasone valerate ointment (VALISONE) 0.1 % Apply a pea sized amount 3 x a week 30 g 3  . budesonide-formoterol (SYMBICORT) 160-4.5 MCG/ACT inhaler Inhale 2 puffs into the lungs every morning.    . calcium carbonate (OS-CAL) 600 MG TABS tablet Take 600 mg by mouth 2 (two) times daily with a meal.    . Cholecalciferol (VITAMIN D-3) 1000 UNITS CAPS Take by mouth daily.    . clobetasol ointment (TEMOVATE) 6.78 % Apply 1 application topically 2 (two) times daily. Apply a small amount to the perianal area twice daily for 2 weeks 30 g 0  . Coenzyme Q10 (COQ-10 PO) Take by mouth.    . conjugated estrogens (PREMARIN) vaginal cream Use 1/2 g to the vulva every night at bed time for the first 2 weeks, then use 1/2 g to the vulva two times per week. 30 g 1  . diclofenac sodium (VOLTAREN) 1 % GEL Apply topically 4 (four) times daily.    Marland Kitchen latanoprost (XALATAN) 0.005 %  ophthalmic solution Place 1 drop into both eyes at bedtime.    . lidocaine (XYLOCAINE) 5 % ointment Apply 1 application topically 4 (four) times daily as needed. 30 g 0  . losartan (COZAAR) 100 MG tablet     . mirabegron ER (MYRBETRIQ) 25 MG TB24 tablet Take 1 tablet (25 mg total) by mouth daily for 30 days. 30 tablet 0  . Misc Natural Products (FOCUSED MIND PO) Take by mouth daily.    . Multiple Vitamins-Minerals (CENTRUM SILVER ADULT 50+ PO) Take by mouth daily.    . Omega-3 Fatty Acids (FISH OIL) 1000 MG CAPS Take 1 capsule by mouth daily.    . polyethylene glycol (MIRALAX / GLYCOLAX) 17 g packet Take 17 g by mouth daily. Every other day    . vitamin C (ASCORBIC ACID) 250 MG tablet Take 250 mg by mouth daily.     No current facility-administered medications for this visit.      ALLERGIES: Contrast media [iodinated diagnostic agents]; Ether; and Iohexol  Family History   Problem Relation Age of Onset  . Breast cancer Mother        deceased -65's  . Heart attack Father   . Heart attack Brother     Social History   Socioeconomic History  . Marital status: Widowed    Spouse name: Not on file  . Number of children: Not on file  . Years of education: Not on file  . Highest education level: Not on file  Occupational History  . Not on file  Social Needs  . Financial resource strain: Not on file  . Food insecurity:    Worry: Not on file    Inability: Not on file  . Transportation needs:    Medical: Not on file    Non-medical: Not on file  Tobacco Use  . Smoking status: Never Smoker  . Smokeless tobacco: Never Used  Substance and Sexual Activity  . Alcohol use: Not Currently  . Drug use: No  . Sexual activity: Not Currently    Birth control/protection: Post-menopausal  Lifestyle  . Physical activity:    Days per week: Not on file    Minutes per session: Not on file  . Stress: Not on file  Relationships  . Social connections:    Talks on phone: Not on file    Gets together: Not on file    Attends religious service: Not on file    Active member of club or organization: Not on file    Attends meetings of clubs or organizations: Not on file    Relationship status: Not on file  . Intimate partner violence:    Fear of current or ex partner: Not on file    Emotionally abused: Not on file    Physically abused: Not on file    Forced sexual activity: Not on file  Other Topics Concern  . Not on file  Social History Narrative  . Not on file    Review of Systems  Constitutional: Negative.   HENT: Negative.   Eyes: Negative.   Respiratory: Negative.   Cardiovascular: Negative.   Gastrointestinal:       Rectal pain  Genitourinary:       Vaginal irritation Vaginal pain Labial swelling and redness  Musculoskeletal: Negative.   Skin: Negative.   Neurological: Positive for dizziness.  Endo/Heme/Allergies: Negative.    Psychiatric/Behavioral: Negative.     PHYSICAL EXAMINATION:    BP 126/60 (BP Location: Left Arm, Patient Position: Sitting, Cuff  Size: Normal)   Pulse 64   Temp 97.9 F (36.6 C) (Skin)   Wt 189 lb 3.2 oz (85.8 kg)   LMP 08/14/1985 (Approximate)   BMI 31.48 kg/m     General appearance: alert, cooperative and appears stated age   Pelvic: External genitalia:  The right labia minora is swollen and red. Loss of architecture, diffuse whitening is unchanged.               Urethra:  normal appearing urethra with no masses, tenderness or lesions              Bartholins and Skenes: normal                 Vagina: very atrophic appearing vagina with normal color and discharge, no lesions              Anus: diffuse whitening in the perianal region, some thickening, c/w lichen simplex chronicus  Chaperone was present for exam.  ASSESSMENT Recurrent issues with vulvitis and perianal dermatitis Anal fissure, pain is improving    PLAN Discussed vulvar skin care Affirm sent Use clobetasol BID x 2 weeks Use estrogen externally 2 x a week Use Vaseline daily F/U in 2 weeks, sooner with concerns   An After Visit Summary was printed and given to the patient.  ~25 minutes face to face time of which over 50% was spent in counseling.

## 2019-01-13 NOTE — Patient Instructions (Signed)
1) Use Vaseline externally on the vulva and perianal area  2) Use the clobetasol ointment 2 x a day for 2 weeks (on the vulva and perianal region)  3) Use the estrogen cream externally 2 x a week  4) f/u in 2 weeks

## 2019-01-14 ENCOUNTER — Telehealth: Payer: Self-pay

## 2019-01-14 LAB — VAGINITIS/VAGINOSIS, DNA PROBE
Candida Species: NEGATIVE
Gardnerella vaginalis: NEGATIVE
Trichomonas vaginosis: NEGATIVE

## 2019-01-14 NOTE — Telephone Encounter (Signed)
Patient states that she and her friend read online that lichen sclerosus can cause squamous cell carcinoma. Asking if this is true and if she should be concerned?  Notes recorded by Sprague, Harley Hallmark, RN on 01/14/2019 at 3:43 PM EDT Spoke with patient. Results given. Patient verbalizes understanding. ------  Notes recorded by Salvadore Dom, MD on 01/14/2019 at 3:31 PM EDT Please advise the patient of normal results.

## 2019-01-14 NOTE — Telephone Encounter (Signed)
Lichen sclerosis doesn't cause cancer, but women who have lichen sclerosis do have an increased risk of vulvar cancer. That is one of the reasons she should continue to be seen a minimum of 1 x a year. If skin changes are noted that are concerning a biopsy would be done. So far she hasn't had any signs of cancer.

## 2019-01-15 ENCOUNTER — Ambulatory Visit: Payer: Self-pay | Admitting: Orthopedic Surgery

## 2019-01-15 NOTE — Telephone Encounter (Signed)
Spoke with patient. Message given as seen below from Dr.Jertson. Patient verbalizes understanding. Encounter closed. 

## 2019-01-22 ENCOUNTER — Ambulatory Visit: Payer: Self-pay | Admitting: Orthopedic Surgery

## 2019-01-24 ENCOUNTER — Other Ambulatory Visit: Payer: Self-pay

## 2019-01-25 ENCOUNTER — Encounter (HOSPITAL_COMMUNITY): Payer: Self-pay

## 2019-01-25 ENCOUNTER — Ambulatory Visit (HOSPITAL_COMMUNITY)
Admission: EM | Admit: 2019-01-25 | Discharge: 2019-01-25 | Disposition: A | Discharge status: Home / Self Care | Payer: Medicare Other | Attending: Family Medicine | Admitting: Family Medicine

## 2019-01-25 DIAGNOSIS — T63441A Toxic effect of venom of bees, accidental (unintentional), initial encounter: Secondary | ICD-10-CM

## 2019-01-25 DIAGNOSIS — T7840XA Allergy, unspecified, initial encounter: Secondary | ICD-10-CM | POA: Diagnosis not present

## 2019-01-25 DIAGNOSIS — R220 Localized swelling, mass and lump, head: Secondary | ICD-10-CM

## 2019-01-25 MED ORDER — FAMOTIDINE 20 MG PO TABS: 20.0000 mg | ORAL_TABLET | Freq: Two times a day (BID) | ORAL | 0 refills | Status: DC

## 2019-01-25 MED ORDER — METHYLPREDNISOLONE SODIUM SUCC 125 MG IJ SOLR
125.0000 mg | Freq: Once | INTRAMUSCULAR | Status: AC
  Administered 2019-01-25: 125 mg via INTRAMUSCULAR

## 2019-01-25 MED ORDER — FAMOTIDINE 20 MG PO TABS
ORAL_TABLET | ORAL | Status: AC
  Filled 2019-01-25: qty 1

## 2019-01-25 MED ORDER — DIPHENHYDRAMINE HCL 25 MG PO CAPS
25.0000 mg | ORAL_CAPSULE | Freq: Once | ORAL | Status: AC
  Administered 2019-01-25: 25 mg via ORAL

## 2019-01-25 MED ORDER — METHYLPREDNISOLONE SODIUM SUCC 125 MG IJ SOLR
INTRAMUSCULAR | Status: AC
  Filled 2019-01-25: qty 2

## 2019-01-25 MED ORDER — DIPHENHYDRAMINE HCL 25 MG PO CAPS
ORAL_CAPSULE | ORAL | Status: AC
  Filled 2019-01-25: qty 1

## 2019-01-25 MED ORDER — FAMOTIDINE 20 MG PO TABS
20.0000 mg | ORAL_TABLET | Freq: Once | ORAL | Status: AC
  Administered 2019-01-25: 20 mg via ORAL

## 2019-01-25 MED ORDER — CETIRIZINE HCL 10 MG PO CAPS: 10.0000 mg | ORAL_CAPSULE | Freq: Every day | ORAL | 0 refills | Status: DC

## 2019-01-25 MED ORDER — PREDNISONE 20 MG PO TABS: 20.0000 mg | ORAL_TABLET | Freq: Two times a day (BID) | ORAL | 0 refills | Status: DC

## 2019-01-25 NOTE — Discharge Instructions (Signed)
We gave you a shot of Solu-Medrol, famotidine/Pepcid and Benadryl today. Please continue with prednisone 20 mg twice daily for the next 2 to 3 days for swelling and itching Continue with antihistamines-Daily cetirizine for 4 to 5 days, may supplement with Benadryl as needed Famotidine/Pepcid twice daily for the next 4 to 5 days Cool compresses to area of swelling  Please follow-up if swelling worsening again, developing difficulty breathing, shortness of breath, throat swelling, lip swelling

## 2019-01-25 NOTE — ED Provider Notes (Signed)
[place]    CSN: [id number] Arrival date & time: 01/25/19  1103     History   Chief Complaint Chief Complaint  Patient presents with   Facial Swelling    HPI [name] is a 73 y.o. female history of COPD, glaucoma, hypertension, hyperlipidemia, osteoporosis, presenting today for evaluation of facial swelling after bee sting.  Patient states that last night around 630/7 p.m. she was using a garden hose when she uncovered a beehive and was stung by approximately 15 yellow jackets.  Since she has developed swelling to her face and bilateral elbow areas as well as her chest.  She denies any difficulty breathing or throat swelling.  She was evaluated by EMS last night after the incident.  She took a Benadryl, but believes it expired in 2018.  She has had persistent swelling, does not feel symptoms have significantly progressed, but do feel they are slightly worse this morning than they were last night.  She has not taken any other medicines.  Incident happened approximately 12 hours ago.  HPI  Past Medical History:  Diagnosis Date   COPD (chronic obstructive pulmonary disease) ([place])    Glaucoma    History of recurrent UTIs    Hyperlipidemia    Hypertension    Lichen sclerosus January 2017   vulva/perianal region   Non-melanoma skin cancer 08/2015   right leg   Osteopenia    Osteoporosis    Post-menopausal      Patient Active Problem List   Diagnosis Date Noted   Status post foot surgery 06/27/2013   Other hammer toe (acquired) 06/10/2013   Pain in foot 06/10/2013   Onychomycosis 06/10/2013    Past Surgical History:  Procedure Laterality Date   FOOT SURGERY Right 06/2013   right 2nd toe   HEMATOMA EVACUATION Left 2007   left calf - excision of hemangioma with thrombus    OB History    Gravida  6   Para  1   Term  1   Preterm  0   AB  5   Living  1     SAB  5   TAB  0   Ectopic  0   Multiple  0   Live Births    1            Home Medications    Prior to Admission medications   Medication Sig Start Date End Date Taking? Authorizing Provider  albuterol (PROAIR HFA) 108 (90 BASE) MCG/ACT inhaler Inhale 2 puffs into the lungs every 6 (six) hours as needed for wheezing or shortness of breath.    [provider]  aspirin 81 MG tablet Take 81 mg by mouth daily.    [provider]  atorvastatin (LIPITOR) 20 MG tablet Take 1 tablet by mouth at bedtime. 06/26/15   [provider]  betamethasone valerate ointment (VALISONE) 0.1 % Apply a pea sized amount 3 x a week 12/21/17   Salvadore Dom, MD  budesonide-formoterol [facility]) 160-4.5 MCG/ACT inhaler Inhale 2 puffs into the lungs every morning.    [provider]  calcium carbonate (OS-CAL) 600 MG TABS tablet Take 600 mg by mouth 2 (two) times daily with a meal.    [provider]  Cetirizine HCl 10 MG CAPS Take 1 capsule (10 mg total) by mouth daily for 10 days. 01/25/19 02/04/19  Pearlina Friedly C, PA-C  Cholecalciferol (VITAMIN D-3) 1000 UNITS CAPS Take by mouth daily.    [provider]  clobetasol ointment (TEMOVATE) 0.86 % Apply 1 application topically 2 (two) times daily. Apply a small amount to the perianal area twice daily for 2 weeks 09/03/18   Salvadore Dom, MD  Coenzyme Q10 (COQ-10 PO) Take by mouth.    [provider]  conjugated estrogens (PREMARIN) vaginal cream Use 1/2 g to the vulva every night at bed time for the first 2 weeks, then use 1/2 g to the vulva two times per week. 12/27/18   Nunzio Cobbs, MD  diclofenac sodium (VOLTAREN) 1 % GEL Apply topically 4 (four) times daily.    [provider]  famotidine (PEPCID) 20 MG tablet Take 1 tablet (20 mg total) by mouth 2 (two) times daily. 01/25/19   Jackquline Branca C, PA-C  latanoprost (XALATAN) 0.005 % ophthalmic solution Place 1 drop into both eyes at bedtime.    [provider]  lidocaine  (XYLOCAINE) 5 % ointment Apply 1 application topically 4 (four) times daily as needed. 09/03/18   Salvadore Dom, MD  losartan (COZAAR) 100 MG tablet  10/03/17   [provider]  mirabegron ER (MYRBETRIQ) 25 MG TB24 tablet Take 1 tablet (25 mg total) by mouth daily for 30 days. 01/01/19 01/31/19  Salvadore Dom, MD  Misc Natural Products (FOCUSED MIND PO) Take by mouth daily.    [provider]  Multiple Vitamins-Minerals (CENTRUM SILVER ADULT 50+ PO) Take by mouth daily.    [provider]  Omega-3 Fatty Acids (FISH OIL) 1000 MG CAPS Take 1 capsule by mouth daily.    [provider]  polyethylene glycol (MIRALAX / GLYCOLAX) 17 g packet Take 17 g by mouth daily. Every other day    [provider]  predniSONE (DELTASONE) 20 MG tablet Take 1 tablet (20 mg total) by mouth 2 (two) times daily with a meal. 01/25/19   Aidenn Skellenger C, PA-C  vitamin C (ASCORBIC ACID) 250 MG tablet Take 250 mg by mouth daily.    [provider]    Family History Family History  Problem Relation Age of Onset   Breast cancer Mother        deceased -91's   Heart attack Father    Heart attack Brother     Social History Social History   Tobacco Use   Smoking status: Never Smoker   Smokeless tobacco: Never Used  Substance Use Topics   Alcohol use: Not Currently   Drug use: No     Allergies   Contrast media [iodinated diagnostic agents], Ether, and Iohexol   Review of Systems Review of Systems  Constitutional: Negative for fatigue and fever.  HENT: Positive for facial swelling. Negative for mouth sores.   Eyes: Negative for visual disturbance.  Respiratory: Negative for shortness of breath.   Cardiovascular: Negative for chest pain.  Gastrointestinal: Negative for abdominal pain, nausea and vomiting.  Musculoskeletal: Negative for arthralgias and joint swelling.  Skin: Positive for color change and rash. Negative for wound.    Neurological: Negative for dizziness, weakness, light-headedness and headaches.     Physical Exam Triage Vital Signs ED Triage Vitals [01/25/19 1119]  Enc Vitals Group     BP (!) 109/57     Pulse Rate 67     Resp      Temp 97.8 F (36.6 C)     Temp Source Oral     SpO2 100 %     Weight      Height  Head Circumference      Peak Flow      Pain Score 8     Pain Loc      Pain Edu?      Excl. in [place]?    No data found.  Updated Vital Signs BP (!) 142/80 (BP Location: Left Arm)    Pulse 67    Temp 97.8 F (36.6 C) (Oral)    Resp 16    LMP 08/14/1985 (Approximate)    SpO2 100%   Visual Acuity Right Eye Distance:   Left Eye Distance:   Bilateral Distance:    Right Eye Near:   Left Eye Near:    Bilateral Near:     Physical Exam Vitals signs and nursing note reviewed.  Constitutional:      General: She is not in acute distress.    Appearance: She is well-developed.  HENT:     Head: Normocephalic and atraumatic.     Comments: Significant swelling and erythema around bilateral eyes, left eye appears completely shut, right eye partially visible  No significant perioral swelling, no signs of angioedema    Mouth/Throat:     Comments: Oral mucosa pink and moist, no tonsillar enlargement or exudate. Posterior pharynx patent and nonerythematous, no uvula deviation or swelling. Normal phonation. Eyes:     Conjunctiva/sclera: Conjunctivae normal.  Neck:     Musculoskeletal: Neck supple.  Cardiovascular:     Rate and Rhythm: Normal rate and regular rhythm.     Heart sounds: No murmur.  Pulmonary:     Effort: Pulmonary effort is normal. No respiratory distress.     Breath sounds: Normal breath sounds.     Comments: Breathing comfortably at rest, CTABL, no wheezing, rales or other adventitious sounds auscultated Speaking in full sentences Abdominal:     Palpations: Abdomen is soft.     Tenderness: There is no abdominal tenderness.  Skin:    General: Skin is warm and  dry.     Comments: Bilateral antecubital areas with localized erythema and swelling with small punctate lesions that appear to be area of sting, small area of petechial lesions to left upper arm  Upper central chest with localized erythema and swelling  Neurological:     Mental Status: She is alert.      UC Treatments / Results  Labs (all labs ordered are listed, but only abnormal results are displayed) Labs Reviewed - No data to display  EKG None  Radiology No results found.  Procedures Procedures (including critical care time)  Medications Ordered in UC Medications  methylPREDNISolone sodium succinate (SOLU-MEDROL) 125 mg/2 mL injection 125 mg (125 mg Intramuscular Given 01/25/19 1220)  famotidine (PEPCID) tablet 20 mg (20 mg Oral Given 01/25/19 1221)  diphenhydrAMINE (BENADRYL) capsule 25 mg (25 mg Oral Given 01/25/19 1221)  methylPREDNISolone sodium succinate (SOLU-MEDROL) 125 mg/2 mL injection (has no administration in time range)  famotidine (PEPCID) 20 MG tablet (has no administration in time range)  diphenhydrAMINE (BENADRYL) 25 mg capsule (has no administration in time range)    Initial Impression / Assessment and Plan / UC Course  I have reviewed the triage vital signs and the nursing notes.  Pertinent labs & imaging results that were available during my care of the patient were reviewed by me and considered in my medical decision making (see chart for details).  Clinical Course as of Jan 25 1412  Sat Jan 25, 2019  1240 Checked on at 12:35, swelling stable   [HW]  1257 Rechecked,  swelling around eyes improving, breathing stable   [HW]    Clinical Course User Index [HW] Jheremy Boger C, PA-C    Patient appears to have an allergic reaction to bee stings.  At this time does not appear to have airway involvement.  Incident happened approximately 12 hours ago.  Will provide Solu-Medrol IM 125, famotidine 20 mg as well as Benadryl 25 mg.  Swelling and orbital area  is improving after approximately 30 minutes.  Patient was monitored for 1 hour post injections without progression.  Will send home with continued steroid use over the next few days, feel benefit is greater than the risk given amount of swelling patient presented with initially.  Continue antihistamines, famotidine.  Continue to monitor breathing and swelling, return if worsening.Discussed strict return precautions. Patient verbalized understanding and is agreeable with plan.  Final Clinical Impressions(s) / UC Diagnoses   Final diagnoses:  Allergic reaction, initial encounter  Facial swelling  Bee sting, accidental or unintentional, initial encounter     Discharge Instructions     We gave you a shot of Solu-Medrol, famotidine/Pepcid and Benadryl today. Please continue with prednisone 20 mg twice daily for the next 2 to 3 days for swelling and itching Continue with antihistamines-Daily cetirizine for 4 to 5 days, may supplement with Benadryl as needed Famotidine/Pepcid twice daily for the next 4 to 5 days Cool compresses to area of swelling  Please follow-up if swelling worsening again, developing difficulty breathing, shortness of breath, throat swelling, lip swelling    ED Prescriptions    Medication Sig Dispense Auth. Provider   predniSONE (DELTASONE) 20 MG tablet Take 1 tablet (20 mg total) by mouth 2 (two) times daily with a meal. 3 tablet Izik Bingman C, PA-C   Cetirizine HCl 10 MG CAPS Take 1 capsule (10 mg total) by mouth daily for 10 days. 10 capsule Jabree Pernice C, PA-C   famotidine (PEPCID) 20 MG tablet Take 1 tablet (20 mg total) by mouth 2 (two) times daily. 30 tablet Dimond Crotty, [place] C, PA-C     Controlled Substance Prescriptions [place] Controlled Substance Registry consulted? Not Applicable   Janith Lima, [place] 01/25/19 1413

## 2019-01-25 NOTE — ED Triage Notes (Signed)
Pt C/O bee sting to her face yesterday evening.  Pt states that at least 15 bee sting her in the face and body.  Pt face is swollen around her eyes and mouth.

## 2019-01-28 ENCOUNTER — Ambulatory Visit: Payer: Self-pay | Admitting: Obstetrics and Gynecology

## 2019-01-28 NOTE — Progress Notes (Deleted)
GYNECOLOGY  VISIT   HPI: 73 y.o.   Widowed White or Caucasian Not Hispanic or Latino  female   [phone] with Patient's last menstrual period was 08/14/1985 (approximate).   here for     GYNECOLOGIC HISTORY: Patient's last menstrual period was 08/14/1985 (approximate). Contraception:*** Menopausal hormone therapy: ***        OB History    Gravida  6   Para  1   Term  1   Preterm  0   AB  5   Living  1     SAB  5   TAB  0   Ectopic  0   Multiple  0   Live Births  1              Patient Active Problem List   Diagnosis Date Noted  . Status post foot surgery 06/27/2013  . Other hammer toe (acquired) 06/10/2013  . Pain in foot 06/10/2013  . Onychomycosis 06/10/2013    Past Medical History:  Diagnosis Date  . COPD (chronic obstructive pulmonary disease) ([place])   . Glaucoma   . History of recurrent UTIs   . Hyperlipidemia   . Hypertension   . Lichen sclerosus January 2017   vulva/perianal region  . Non-melanoma skin cancer 08/2015   right leg  . Osteopenia   . Osteoporosis   . Post-menopausal      Past Surgical History:  Procedure Laterality Date  . FOOT SURGERY Right 06/2013   right 2nd toe  . HEMATOMA EVACUATION Left 2007   left calf - excision of hemangioma with thrombus    Current Outpatient Medications  Medication Sig Dispense Refill  . albuterol (PROAIR HFA) 108 (90 BASE) MCG/ACT inhaler Inhale 2 puffs into the lungs every 6 (six) hours as needed for wheezing or shortness of breath.    Marland Kitchen aspirin 81 MG tablet Take 81 mg by mouth daily.    Marland Kitchen atorvastatin (LIPITOR) 20 MG tablet Take 1 tablet by mouth at bedtime.  11  . betamethasone valerate ointment (VALISONE) 0.1 % Apply a pea sized amount 3 x a week 30 g 3  . budesonide-formoterol (SYMBICORT) 160-4.5 MCG/ACT inhaler Inhale 2 puffs into the lungs every morning.    . calcium carbonate (OS-CAL) 600 MG TABS tablet Take 600 mg by mouth 2 (two) times daily with a meal.    . Cetirizine HCl 10 MG  CAPS Take 1 capsule (10 mg total) by mouth daily for 10 days. 10 capsule 0  . Cholecalciferol (VITAMIN D-3) 1000 UNITS CAPS Take by mouth daily.    . clobetasol ointment (TEMOVATE) 3.15 % Apply 1 application topically 2 (two) times daily. Apply a small amount to the perianal area twice daily for 2 weeks 30 g 0  . Coenzyme Q10 (COQ-10 PO) Take by mouth.    . conjugated estrogens (PREMARIN) vaginal cream Use 1/2 g to the vulva every night at bed time for the first 2 weeks, then use 1/2 g to the vulva two times per week. 30 g 1  . diclofenac sodium (VOLTAREN) 1 % GEL Apply topically 4 (four) times daily.    . famotidine (PEPCID) 20 MG tablet Take 1 tablet (20 mg total) by mouth 2 (two) times daily. 30 tablet 0  . latanoprost (XALATAN) 0.005 % ophthalmic solution Place 1 drop into both eyes at bedtime.    . lidocaine (XYLOCAINE) 5 % ointment Apply 1 application topically 4 (four) times daily as needed. 30 g 0  . losartan (  COZAAR) 100 MG tablet     . mirabegron ER (MYRBETRIQ) 25 MG TB24 tablet Take 1 tablet (25 mg total) by mouth daily for 30 days. 30 tablet 0  . Misc Natural Products (FOCUSED MIND PO) Take by mouth daily.    . Multiple Vitamins-Minerals (CENTRUM SILVER ADULT 50+ PO) Take by mouth daily.    . Omega-3 Fatty Acids (FISH OIL) 1000 MG CAPS Take 1 capsule by mouth daily.    . polyethylene glycol (MIRALAX / GLYCOLAX) 17 g packet Take 17 g by mouth daily. Every other day    . predniSONE (DELTASONE) 20 MG tablet Take 1 tablet (20 mg total) by mouth 2 (two) times daily with a meal. 3 tablet 0  . vitamin C (ASCORBIC ACID) 250 MG tablet Take 250 mg by mouth daily.     No current facility-administered medications for this visit.      ALLERGIES: Contrast media [iodinated diagnostic agents], Ether, and Iohexol  Family History  Problem Relation Age of Onset  . Breast cancer Mother        deceased -10's  . Heart attack Father   . Heart attack Brother     Social History   Socioeconomic  History  . Marital status: Widowed    Spouse name: Not on file  . Number of children: Not on file  . Years of education: Not on file  . Highest education level: Not on file  Occupational History  . Not on file  Social Needs  . Financial resource strain: Not on file  . Food insecurity    Worry: Not on file    Inability: Not on file  . Transportation needs    Medical: Not on file    Non-medical: Not on file  Tobacco Use  . Smoking status: Never Smoker  . Smokeless tobacco: Never Used  Substance and Sexual Activity  . Alcohol use: Not Currently  . Drug use: No  . Sexual activity: Not Currently    Birth control/protection: Post-menopausal  Lifestyle  . Physical activity    Days per week: Not on file    Minutes per session: Not on file  . Stress: Not on file  Relationships  . Social Herbalist on phone: Not on file    Gets together: Not on file    Attends religious service: Not on file    Active member of club or organization: Not on file    Attends meetings of clubs or organizations: Not on file    Relationship status: Not on file  . Intimate partner violence    Fear of current or ex partner: Not on file    Emotionally abused: Not on file    Physically abused: Not on file    Forced sexual activity: Not on file  Other Topics Concern  . Not on file  Social History Narrative  . Not on file    ROS  PHYSICAL EXAMINATION:    LMP 08/14/1985 (Approximate)     General appearance: alert, cooperative and appears stated age Neck: no adenopathy, supple, symmetrical, trachea midline and thyroid {CHL AMB PHY EX THYROID NORM [phone]::"normal to inspection and palpation"} Breasts: {Exam; breast:13139::"normal appearance, no masses or tenderness"} Abdomen: soft, non-tender; non distended, no masses,  no organomegaly  Pelvic: External genitalia:  no lesions              Urethra:  normal appearing urethra with no masses, tenderness or lesions  Bartholins and Skenes: normal                 Vagina: normal appearing vagina with normal color and discharge, no lesions              Cervix: {CHL AMB PHY EX CERVIX NORM DEFAULT:[phone]::"no lesions"}              Bimanual Exam:  Uterus:  {CHL AMB PHY EX UTERUS NORM DEFAULT:[id number]::"normal size, contour, position, consistency, mobility, non-tender"}              Adnexa: {CHL AMB PHY EX ADNEXA NO MASS DEFAULT:[phone]::"no mass, fullness, tenderness"}              Rectovaginal: {yes no:[id number]}.  Confirms.              Anus:  normal sphincter tone, no lesions  Chaperone was present for exam.  ASSESSMENT     PLAN    An After Visit Summary was printed and given to the patient.  *** minutes face to face time of which over 50% was spent in counseling.

## 2019-01-29 NOTE — Progress Notes (Signed)
GYNECOLOGY  VISIT   HPI: 73 y.o.   Widowed White or Caucasian Not Hispanic or Latino  female   [phone] with Patient's last menstrual period was 08/14/1985 (approximate).   here for recheck on lichen sclerosis, perianal dermatitis, anal fissure and OAB medication.    She was seen a few weeks ago. She used steroid ointment, Vaseline, estrogen. She is feeling better. Her fissure is getting better with miralax (taking it every other day).No pain with BM in the last few days. She was switched from Trospuim Chloride to Myrbetriq for OAB secondary to insurance coverage. She hasn't been leaking urine. No urinary frequency or urgency with the myrbetriq.   She was stung by 15-20 bees on her face and arms when she unintentionally disturbed a hive in her yard. She was seen in Urgent care. Doing fine now.     GYNECOLOGIC HISTORY: Patient's last menstrual period was 08/14/1985 (approximate). Contraception:Postmenopausal Menopausal hormone therapy: Estrogen vaginal cream        OB History    Gravida  6   Para  1   Term  1   Preterm  0   AB  5   Living  1     SAB  5   TAB  0   Ectopic  0   Multiple  0   Live Births  1              Patient Active Problem List   Diagnosis Date Noted  . Status post foot surgery 06/27/2013  . Other hammer toe (acquired) 06/10/2013  . Pain in foot 06/10/2013  . Onychomycosis 06/10/2013    Past Medical History:  Diagnosis Date  . COPD (chronic obstructive pulmonary disease) ([place])   . Glaucoma   . History of recurrent UTIs   . Hyperlipidemia   . Hypertension   . Lichen sclerosus January 2017   vulva/perianal region  . Non-melanoma skin cancer 08/2015   right leg  . Osteopenia   . Osteoporosis   . Post-menopausal      Past Surgical History:  Procedure Laterality Date  . FOOT SURGERY Right 06/2013   right 2nd toe  . HEMATOMA EVACUATION Left 2007   left calf - excision of hemangioma with thrombus    Current Outpatient Medications   Medication Sig Dispense Refill  . albuterol (PROAIR HFA) 108 (90 BASE) MCG/ACT inhaler Inhale 2 puffs into the lungs every 6 (six) hours as needed for wheezing or shortness of breath.    Marland Kitchen aspirin 81 MG tablet Take 81 mg by mouth daily.    Marland Kitchen atorvastatin (LIPITOR) 20 MG tablet Take 1 tablet by mouth at bedtime.  11  . betamethasone valerate ointment (VALISONE) 0.1 % Apply a pea sized amount 3 x a week 30 g 3  . budesonide-formoterol (SYMBICORT) 160-4.5 MCG/ACT inhaler Inhale 2 puffs into the lungs every morning.    . calcium carbonate (OS-CAL) 600 MG TABS tablet Take 600 mg by mouth 2 (two) times daily with a meal.    . Cetirizine HCl 10 MG CAPS Take 1 capsule (10 mg total) by mouth daily for 10 days. 10 capsule 0  . Cholecalciferol (VITAMIN D-3) 1000 UNITS CAPS Take by mouth daily.    . clobetasol ointment (TEMOVATE) 8.85 % Apply 1 application topically 2 (two) times daily. Apply a small amount to the perianal area twice daily for 2 weeks 30 g 0  . Coenzyme Q10 (COQ-10 PO) Take by mouth.    . conjugated estrogens (PREMARIN)  vaginal cream Use 1/2 g to the vulva every night at bed time for the first 2 weeks, then use 1/2 g to the vulva two times per week. 30 g 1  . diclofenac sodium (VOLTAREN) 1 % GEL Apply topically 4 (four) times daily.    . famotidine (PEPCID) 20 MG tablet Take 1 tablet (20 mg total) by mouth 2 (two) times daily. 30 tablet 0  . latanoprost (XALATAN) 0.005 % ophthalmic solution Place 1 drop into both eyes at bedtime.    . lidocaine (XYLOCAINE) 5 % ointment Apply 1 application topically 4 (four) times daily as needed. 30 g 0  . losartan (COZAAR) 100 MG tablet     . mirabegron ER (MYRBETRIQ) 25 MG TB24 tablet Take 1 tablet (25 mg total) by mouth daily for 30 days. 30 tablet 0  . Misc Natural Products (FOCUSED MIND PO) Take by mouth daily.    . Multiple Vitamins-Minerals (CENTRUM SILVER ADULT 50+ PO) Take by mouth daily.    . Omega-3 Fatty Acids (FISH OIL) 1000 MG CAPS Take 1  capsule by mouth daily.    . polyethylene glycol (MIRALAX / GLYCOLAX) 17 g packet Take 17 g by mouth daily. Every other day    . vitamin C (ASCORBIC ACID) 250 MG tablet Take 250 mg by mouth daily.     No current facility-administered medications for this visit.      ALLERGIES: Contrast media [iodinated diagnostic agents], Ether, and Iohexol  Family History  Problem Relation Age of Onset  . Breast cancer Mother        deceased -5's  . Heart attack Father   . Heart attack Brother     Social History   Socioeconomic History  . Marital status: Widowed    Spouse name: Not on file  . Number of children: Not on file  . Years of education: Not on file  . Highest education level: Not on file  Occupational History  . Not on file  Social Needs  . Financial resource strain: Not on file  . Food insecurity    Worry: Not on file    Inability: Not on file  . Transportation needs    Medical: Not on file    Non-medical: Not on file  Tobacco Use  . Smoking status: Never Smoker  . Smokeless tobacco: Never Used  Substance and Sexual Activity  . Alcohol use: Not Currently  . Drug use: No  . Sexual activity: Not Currently    Birth control/protection: Post-menopausal  Lifestyle  . Physical activity    Days per week: Not on file    Minutes per session: Not on file  . Stress: Not on file  Relationships  . Social Herbalist on phone: Not on file    Gets together: Not on file    Attends religious service: Not on file    Active member of club or organization: Not on file    Attends meetings of clubs or organizations: Not on file    Relationship status: Not on file  . Intimate partner violence    Fear of current or ex partner: Not on file    Emotionally abused: Not on file    Physically abused: Not on file    Forced sexual activity: Not on file  Other Topics Concern  . Not on file  Social History Narrative  . Not on file    Review of Systems  Constitutional: Negative.    HENT: Negative.  Eyes: Negative.   Respiratory: Negative.   Cardiovascular: Negative.   Gastrointestinal: Negative.   Genitourinary: Negative.   Musculoskeletal: Negative.   Skin: Negative.   Neurological: Negative.   Endo/Heme/Allergies: Negative.   Psychiatric/Behavioral: Negative.     PHYSICAL EXAMINATION:    BP 124/62 (BP Location: Right Arm, Patient Position: Sitting, Cuff Size: Normal)   Pulse 64   Temp (!) 97.2 F (36.2 C) (Skin)   Wt 189 lb 6.4 oz (85.9 kg)   LMP 08/14/1985 (Approximate)   BMI 31.52 kg/m     General appearance: alert, cooperative and appears stated age  Pelvic: External genitalia:  Diffuse whitening, mild agglutination, no fissures, plaques or lesions. Some bruising on the lower left vulva.               Urethra:  normal appearing urethra with no masses, tenderness or lesions              Bartholins and Skenes: normal                 Perianal skin: diffuse whitening, no erythema, no fissures, marked improvement.   Chaperone was present for exam.  ASSESSMENT Marked improvement of her vulvitis and perianal dermatitis Anal fissure is healing Lichen sclerosis is stable OAB, controlled with myrbetriq    PLAN Continue Vaseline daily Use steroid ointment 1-2 x a week to the vulva and perianal area Continue Myrbetriq F/U in 2 months for an annual exam Call with any concerns    An After Visit Summary was printed and given to the patient.  ~15 minutes face to face time of which over 50% was spent in counseling.

## 2019-01-30 ENCOUNTER — Other Ambulatory Visit: Payer: Self-pay

## 2019-01-30 ENCOUNTER — Ambulatory Visit (INDEPENDENT_AMBULATORY_CARE_PROVIDER_SITE_OTHER): Payer: Medicare Other | Admitting: Obstetrics and Gynecology

## 2019-01-30 ENCOUNTER — Encounter: Payer: Self-pay | Admitting: Obstetrics and Gynecology

## 2019-01-30 VITALS — BP 124/62 | HR 64 | Temp 97.2°F | Wt 189.4 lb

## 2019-01-30 DIAGNOSIS — L309 Dermatitis, unspecified: Secondary | ICD-10-CM | POA: Diagnosis not present

## 2019-01-30 DIAGNOSIS — L900 Lichen sclerosus et atrophicus: Secondary | ICD-10-CM | POA: Diagnosis not present

## 2019-01-30 DIAGNOSIS — K602 Anal fissure, unspecified: Secondary | ICD-10-CM | POA: Diagnosis not present

## 2019-01-30 DIAGNOSIS — N3281 Overactive bladder: Secondary | ICD-10-CM

## 2019-01-30 MED ORDER — MIRABEGRON ER 25 MG PO TB24: 25.0000 mg | ORAL_TABLET | Freq: Every day | ORAL | 3 refills | Status: DC

## 2019-01-30 NOTE — Patient Instructions (Signed)
Use Vaseline Daily  Use the steroid ointment to the vulva and perianal area 1-2 x a week  Continue Myrbetriq

## 2019-02-11 ENCOUNTER — Encounter (HOSPITAL_COMMUNITY): Payer: Self-pay

## 2019-02-11 ENCOUNTER — Ambulatory Visit (HOSPITAL_COMMUNITY)
Admission: EM | Admit: 2019-02-11 | Discharge: 2019-02-11 | Disposition: A | Discharge status: Home / Self Care | Payer: Medicare Other | Attending: Urgent Care | Admitting: Urgent Care

## 2019-02-11 ENCOUNTER — Encounter: Payer: Self-pay | Admitting: Obstetrics and Gynecology

## 2019-02-11 ENCOUNTER — Other Ambulatory Visit: Payer: Self-pay

## 2019-02-11 ENCOUNTER — Ambulatory Visit (INDEPENDENT_AMBULATORY_CARE_PROVIDER_SITE_OTHER): Payer: Medicare Other | Admitting: Obstetrics and Gynecology

## 2019-02-11 ENCOUNTER — Telehealth: Payer: Self-pay | Admitting: Obstetrics and Gynecology

## 2019-02-11 VITALS — BP 124/78 | HR 64 | Temp 98.1°F | Wt 188.8 lb

## 2019-02-11 DIAGNOSIS — L900 Lichen sclerosus et atrophicus: Secondary | ICD-10-CM | POA: Diagnosis not present

## 2019-02-11 DIAGNOSIS — S30861A Insect bite (nonvenomous) of abdominal wall, initial encounter: Secondary | ICD-10-CM | POA: Diagnosis not present

## 2019-02-11 DIAGNOSIS — W57XXXA Bitten or stung by nonvenomous insect and other nonvenomous arthropods, initial encounter: Secondary | ICD-10-CM | POA: Diagnosis not present

## 2019-02-11 DIAGNOSIS — K602 Anal fissure, unspecified: Secondary | ICD-10-CM

## 2019-02-11 DIAGNOSIS — N905 Atrophy of vulva: Secondary | ICD-10-CM | POA: Diagnosis not present

## 2019-02-11 DIAGNOSIS — L309 Dermatitis, unspecified: Secondary | ICD-10-CM

## 2019-02-11 DIAGNOSIS — L259 Unspecified contact dermatitis, unspecified cause: Secondary | ICD-10-CM

## 2019-02-11 MED ORDER — TRIAMCINOLONE ACETONIDE 0.1 % EX CREA: 1.0000 "application " | TOPICAL_CREAM | Freq: Two times a day (BID) | CUTANEOUS | 0 refills | Status: DC

## 2019-02-11 NOTE — Patient Instructions (Addendum)
You can try Psyllium (unflavored metamucil) for constipation.   Use the steroid ointment in the perianal area 2 x a day for the next week.  Use vaseline daily to the vulva and perianal region.

## 2019-02-11 NOTE — ED Triage Notes (Signed)
Patient presents to Urgent Care with complaints of tick bite to lower left abdomen since last night. Patient reports she brought the tick with her.

## 2019-02-11 NOTE — Telephone Encounter (Signed)
Spoke with patient. Patient requesting OV.  1. Reports she found a tick on her abdomen on 6/29. She removed the tick and has a swollen, red patch of skin. She kept the tick. Was stung by 17 bees on her face 2wks ago, seen by UC.   2. Reports elongated black spot on her labia that she noticed while bathing last night. RN tried to further assess symptoms, patient states "too many things going on right now, I don't know, just want it checked out".  Requesting OV.  Advised patient she will need to f/u with her PCP or urgent care for further evaluation of the tick bite. OV scheduled for today at 3pm for further evaluation of GYN symptoms. [id number] prescreen negative, precautions reviewed. Patient verbalizes understanding.  Routing to provider for final review. Patient is agreeable to disposition. Will close encounter.

## 2019-02-11 NOTE — ED Provider Notes (Signed)
MRN: [id number] DOB: 09-Jun-1946  Subjective:   [name] is a 73 y.o. female presenting for 1 day history of irritation about her left lower abdomen from suffering a tick bite yesterday.  Patient reports that she removed the tick completely.  Her last Tdap was May 2019.  Has not tried any medications for relief.  Has previously had a tick bite over her right knee that is completely resolved.  She is worried about Lyme disease.  No current facility-administered medications for this encounter.   Current Outpatient Medications:  .  famotidine (PEPCID) 20 MG tablet, Take 1 tablet (20 mg total) by mouth 2 (two) times daily., Disp: 30 tablet, Rfl: 0 .  albuterol (PROAIR HFA) 108 (90 BASE) MCG/ACT inhaler, Inhale 2 puffs into the lungs every 6 (six) hours as needed for wheezing or shortness of breath., Disp: , Rfl:  .  aspirin 81 MG tablet, Take 81 mg by mouth daily., Disp: , Rfl:  .  atorvastatin (LIPITOR) 20 MG tablet, Take 1 tablet by mouth at bedtime., Disp: , Rfl: 11 .  betamethasone valerate ointment (VALISONE) 0.1 %, Apply a pea sized amount 3 x a week, Disp: 30 g, Rfl: 3 .  budesonide-formoterol (SYMBICORT) 160-4.5 MCG/ACT inhaler, Inhale 2 puffs into the lungs every morning., Disp: , Rfl:  .  calcium carbonate (OS-CAL) 600 MG TABS tablet, Take 600 mg by mouth 2 (two) times daily with a meal., Disp: , Rfl:  .  Cetirizine HCl 10 MG CAPS, Take 1 capsule (10 mg total) by mouth daily for 10 days., Disp: 10 capsule, Rfl: 0 .  Cholecalciferol (VITAMIN D-3) 1000 UNITS CAPS, Take by mouth daily., Disp: , Rfl:  .  clobetasol ointment (TEMOVATE) 9.37 %, Apply 1 application topically 2 (two) times daily. Apply a small amount to the perianal area twice daily for 2 weeks, Disp: 30 g, Rfl: 0 .  Coenzyme Q10 (COQ-10 PO), Take by mouth., Disp: , Rfl:  .  conjugated estrogens (PREMARIN) vaginal cream, Use 1/2 g to the vulva every night at bed time for the first 2 weeks, then use 1/2 g to the vulva two  times per week., Disp: 30 g, Rfl: 1 .  diclofenac sodium (VOLTAREN) 1 % GEL, Apply topically 4 (four) times daily., Disp: , Rfl:  .  latanoprost (XALATAN) 0.005 % ophthalmic solution, Place 1 drop into both eyes at bedtime., Disp: , Rfl:  .  lidocaine (XYLOCAINE) 5 % ointment, Apply 1 application topically 4 (four) times daily as needed., Disp: 30 g, Rfl: 0 .  losartan (COZAAR) 100 MG tablet, , Disp: , Rfl:  .  mirabegron ER (MYRBETRIQ) 25 MG TB24 tablet, Take 1 tablet (25 mg total) by mouth daily for 30 days., Disp: 90 tablet, Rfl: 3 .  Misc Natural Products (FOCUSED MIND PO), Take by mouth daily., Disp: , Rfl:  .  Multiple Vitamins-Minerals (CENTRUM SILVER ADULT 50+ PO), Take by mouth daily., Disp: , Rfl:  .  Omega-3 Fatty Acids (FISH OIL) 1000 MG CAPS, Take 1 capsule by mouth daily., Disp: , Rfl:  .  polyethylene glycol (MIRALAX / GLYCOLAX) 17 g packet, Take 17 g by mouth daily. Every other day, Disp: , Rfl:  .  vitamin C (ASCORBIC ACID) 250 MG tablet, Take 250 mg by mouth daily., Disp: , Rfl:    Allergies  Allergen Reactions  . Contrast Media [Iodinated Diagnostic Agents] Hives  . Ether Other (See Comments)    hallucinations  . Iohexol  Code: HIVES, Desc: pt had iv contrast for the first time today. doing a test dose w/ an miroi, 20cc contrast, pt immediately broke out in hives, itching, swelling of lips and tongue.  Check w/ rad concerning recommendations.50 mg of benedryl given po per dr Tery Sanfilippo., Onset Date: [id number]     Past Medical History:  Diagnosis Date  . COPD (chronic obstructive pulmonary disease) ([place])   . Glaucoma   . History of recurrent UTIs   . Hyperlipidemia   . Hypertension   . Lichen sclerosus January 2017   vulva/perianal region  . Non-melanoma skin cancer 08/2015   right leg  . Osteopenia   . Osteoporosis   . Post-menopausal       Past Surgical History:  Procedure Laterality Date  . FOOT SURGERY Right 06/2013   right 2nd toe  . HEMATOMA EVACUATION  Left 2007   left calf - excision of hemangioma with thrombus    Review of Systems  Constitutional: Negative for fever and malaise/fatigue.  HENT: Negative for ear pain, sinus pain and sore throat.   Eyes: Negative for discharge and redness.  Respiratory: Negative for cough, hemoptysis, shortness of breath and wheezing.   Cardiovascular: Negative for chest pain.  Gastrointestinal: Positive for abdominal pain (Focal about the tick bite wound). Negative for nausea and vomiting.  Genitourinary: Negative for dysuria and hematuria.  Musculoskeletal: Negative for myalgias.  Skin: Positive for rash.  Neurological: Negative for headaches.   Negative review of systems for tickborne illness.  Objective:   Vitals: BP 126/76 (BP Location: Right Arm)   Pulse (!) 55   Temp 97.8 F (36.6 C) (Oral)   Resp 16   LMP 08/14/1985 (Approximate)   Physical Exam Constitutional:      General: She is not in acute distress.    Appearance: Normal appearance. She is well-developed. She is not ill-appearing.  HENT:     Head: Normocephalic and atraumatic.     Nose: Nose normal.     Mouth/Throat:     Mouth: Mucous membranes are moist.     Pharynx: Oropharynx is clear.  Eyes:     General: No scleral icterus.    Extraocular Movements: Extraocular movements intact.     Pupils: Pupils are equal, round, and reactive to light.  Cardiovascular:     Rate and Rhythm: Normal rate.  Pulmonary:     Effort: Pulmonary effort is normal.  Skin:    General: Skin is warm and dry.       Neurological:     General: No focal deficit present.     Mental Status: She is alert and oriented to person, place, and time.  Psychiatric:        Mood and Affect: Mood normal.        Behavior: Behavior normal.        Assessment and Plan :   1. Tick bite of abdomen, initial encounter   2. Contact dermatitis, unspecified contact dermatitis type, unspecified trigger    Counseled patient on tickborne illness.  For now we  will manage his contact dermatitis with triamcinolone.  Patient denies any itching and therefore did not want any medications for this. Counseled patient on potential for adverse effects with medications prescribed/recommended today, ER and return-to-clinic precautions discussed, patient verbalized understanding.    Jaynee Eagles, [place] 02/11/19 1116

## 2019-02-11 NOTE — Progress Notes (Signed)
GYNECOLOGY  VISIT   HPI: 73 y.o.   Widowed White or Caucasian Not Hispanic or Latino  female   [phone] with Patient's last menstrual period was 08/14/1985 (approximate).   here for rectal pain and black spot on labia. She noticed the spot last night after she found a tick on her abdomen last night. She is having recurrent rectal pain, feels like her fissure is back. BM every day, the start can be hard. She is taking miralax every day, was taking it every other day. Trying to balance between too soft and too hard.  She has had issues with lichen sclerosis, chronic vulvitis, perianal dermatitis and anal fissures.   GYNECOLOGIC HISTORY: Patient's last menstrual period was 08/14/1985 (approximate). Contraception:Postmenopausal Menopausal hormone therapy: Estrogen vaginal cream        OB History    Gravida  6   Para  1   Term  1   Preterm  0   AB  5   Living  1     SAB  5   TAB  0   Ectopic  0   Multiple  0   Live Births  1              Patient Active Problem List   Diagnosis Date Noted  . Status post foot surgery 06/27/2013  . Other hammer toe (acquired) 06/10/2013  . Pain in foot 06/10/2013  . Onychomycosis 06/10/2013    Past Medical History:  Diagnosis Date  . COPD (chronic obstructive pulmonary disease) ([place])   . Glaucoma   . History of recurrent UTIs   . Hyperlipidemia   . Hypertension   . Lichen sclerosus January 2017   vulva/perianal region  . Non-melanoma skin cancer 08/2015   right leg  . Osteopenia   . Osteoporosis   . Post-menopausal      Past Surgical History:  Procedure Laterality Date  . FOOT SURGERY Right 06/2013   right 2nd toe  . HEMATOMA EVACUATION Left 2007   left calf - excision of hemangioma with thrombus    Current Outpatient Medications  Medication Sig Dispense Refill  . albuterol (PROAIR HFA) 108 (90 BASE) MCG/ACT inhaler Inhale 2 puffs into the lungs every 6 (six) hours as needed for wheezing or shortness of breath.    Marland Kitchen  aspirin 81 MG tablet Take 81 mg by mouth daily.    Marland Kitchen atorvastatin (LIPITOR) 20 MG tablet Take 1 tablet by mouth at bedtime.  11  . betamethasone valerate ointment (VALISONE) 0.1 % Apply a pea sized amount 3 x a week 30 g 3  . budesonide-formoterol (SYMBICORT) 160-4.5 MCG/ACT inhaler Inhale 2 puffs into the lungs every morning.    . calcium carbonate (OS-CAL) 600 MG TABS tablet Take 600 mg by mouth 2 (two) times daily with a meal.    . Cholecalciferol (VITAMIN D-3) 1000 UNITS CAPS Take by mouth daily.    . clobetasol ointment (TEMOVATE) 7.54 % Apply 1 application topically 2 (two) times daily. Apply a small amount to the perianal area twice daily for 2 weeks 30 g 0  . Coenzyme Q10 (COQ-10 PO) Take by mouth.    . conjugated estrogens (PREMARIN) vaginal cream Use 1/2 g to the vulva every night at bed time for the first 2 weeks, then use 1/2 g to the vulva two times per week. 30 g 1  . diclofenac sodium (VOLTAREN) 1 % GEL Apply topically 4 (four) times daily.    . famotidine (PEPCID) 20 MG  tablet Take 1 tablet (20 mg total) by mouth 2 (two) times daily. 30 tablet 0  . latanoprost (XALATAN) 0.005 % ophthalmic solution Place 1 drop into both eyes at bedtime.    . lidocaine (XYLOCAINE) 5 % ointment Apply 1 application topically 4 (four) times daily as needed. 30 g 0  . losartan (COZAAR) 100 MG tablet     . mirabegron ER (MYRBETRIQ) 25 MG TB24 tablet Take 1 tablet (25 mg total) by mouth daily for 30 days. 90 tablet 3  . Misc Natural Products (FOCUSED MIND PO) Take by mouth daily.    . Multiple Vitamins-Minerals (CENTRUM SILVER ADULT 50+ PO) Take by mouth daily.    . Omega-3 Fatty Acids (FISH OIL) 1000 MG CAPS Take 1 capsule by mouth daily.    . polyethylene glycol (MIRALAX / GLYCOLAX) 17 g packet Take 17 g by mouth daily. Every other day    . triamcinolone cream (KENALOG) 0.1 % Apply 1 application topically 2 (two) times daily. 30 g 0  . vitamin C (ASCORBIC ACID) 250 MG tablet Take 250 mg by mouth daily.     . Cetirizine HCl 10 MG CAPS Take 1 capsule (10 mg total) by mouth daily for 10 days. 10 capsule 0   No current facility-administered medications for this visit.      ALLERGIES: Contrast media [iodinated diagnostic agents], Ether, and Iohexol  Family History  Problem Relation Age of Onset  . Breast cancer Mother        deceased -87's  . Heart attack Father   . Heart attack Brother     Social History   Socioeconomic History  . Marital status: Widowed    Spouse name: Not on file  . Number of children: Not on file  . Years of education: Not on file  . Highest education level: Not on file  Occupational History  . Not on file  Social Needs  . Financial resource strain: Not on file  . Food insecurity    Worry: Not on file    Inability: Not on file  . Transportation needs    Medical: Not on file    Non-medical: Not on file  Tobacco Use  . Smoking status: Never Smoker  . Smokeless tobacco: Never Used  Substance and Sexual Activity  . Alcohol use: Not Currently  . Drug use: No  . Sexual activity: Not Currently    Birth control/protection: Post-menopausal  Lifestyle  . Physical activity    Days per week: Not on file    Minutes per session: Not on file  . Stress: Not on file  Relationships  . Social Herbalist on phone: Not on file    Gets together: Not on file    Attends religious service: Not on file    Active member of club or organization: Not on file    Attends meetings of clubs or organizations: Not on file    Relationship status: Not on file  . Intimate partner violence    Fear of current or ex partner: Not on file    Emotionally abused: Not on file    Physically abused: Not on file    Forced sexual activity: Not on file  Other Topics Concern  . Not on file  Social History Narrative  . Not on file    Review of Systems  Constitutional: Negative.   HENT: Negative.   Eyes: Negative.   Respiratory: Negative.   Cardiovascular: Negative.    Gastrointestinal:  Rectal pain  Genitourinary:       Black spot on labia  Musculoskeletal: Negative.   Skin: Negative.   Neurological: Negative.   Endo/Heme/Allergies: Negative.   Psychiatric/Behavioral: Negative.     PHYSICAL EXAMINATION:    BP 124/78 (BP Location: Right Arm, Patient Position: Sitting, Cuff Size: Normal)   Pulse 64   Temp 98.1 F (36.7 C) (Skin)   Wt 188 lb 12.8 oz (85.6 kg)   LMP 08/14/1985 (Approximate)   BMI 31.42 kg/m     General appearance: alert, cooperative and appears stated age  Pelvic: External genitalia: mild agglutination and whitening c/w lichen sclerosis, there is ecchymosis on the left vulva (varing shades of purple, what the patient thought was black)              Urethra:  normal appearing urethra with no masses, tenderness or lesions              Bartholins and Skenes: normal                 Anus:  Fissure at 5-6 o'clock. Perianal area with thick whitening, fissures, large fissure in the skin at 4 o'clock.    Chaperone was present for exam.  ASSESSMENT Lichen sclerosis Vulvar atrophy with ecchymosis  Perianal dermatis Anal fissure    PLAN Try psyllium for constipation (with miralax she goes from diarrhea to constipation) Use steroid ointment to the perianal skin 2 x a day for the next week Use Vaseline to the vulva and perianal area daily F/U in 2 weeks She can use the steroid ointment to the vulva and perianal region up to 2 x a week on a regular basis.    An After Visit Summary was printed and given to the patient.  ~15 minutes face to face time of which over 50% was spent in counseling.

## 2019-02-11 NOTE — Telephone Encounter (Signed)
Patient has a black spot on her vagina and a tick bite.

## 2019-02-27 ENCOUNTER — Ambulatory Visit (INDEPENDENT_AMBULATORY_CARE_PROVIDER_SITE_OTHER): Payer: Medicare Other | Admitting: Obstetrics and Gynecology

## 2019-02-27 ENCOUNTER — Encounter: Payer: Self-pay | Admitting: Obstetrics and Gynecology

## 2019-02-27 ENCOUNTER — Other Ambulatory Visit: Payer: Self-pay

## 2019-02-27 VITALS — BP 122/84 | HR 64 | Temp 98.2°F | Wt 185.2 lb

## 2019-02-27 DIAGNOSIS — L309 Dermatitis, unspecified: Secondary | ICD-10-CM

## 2019-02-27 DIAGNOSIS — L900 Lichen sclerosus et atrophicus: Secondary | ICD-10-CM | POA: Diagnosis not present

## 2019-02-27 DIAGNOSIS — R198 Other specified symptoms and signs involving the digestive system and abdomen: Secondary | ICD-10-CM | POA: Diagnosis not present

## 2019-02-27 DIAGNOSIS — K602 Anal fissure, unspecified: Secondary | ICD-10-CM

## 2019-02-27 NOTE — Progress Notes (Signed)
GYNECOLOGY  VISIT   HPI: 73 y.o.   Widowed White or Caucasian Not Hispanic or Latino  female   [phone] with Patient's last menstrual period was 08/14/1985 (approximate).   here for follow up of lichen sclerosis, vulvar atrophy with ecchymosis , perianal dermatis, and anal fissure. Currently feeling better. The fissure hurts off and on.  She had diarrhea yesterday, has gone from constipation to diarrhea. The diarrhea was very painful with her fissure. She has made dietary changes which has helped her bowel in general. She has seen a GI in the past.   GYNECOLOGIC HISTORY: Patient's last menstrual period was 08/14/1985 (approximate). Contraception: Postmenopausal Menopausal hormone therapy: Premarin vaginal cream        OB History    Gravida  6   Para  1   Term  1   Preterm  0   AB  5   Living  1     SAB  5   TAB  0   Ectopic  0   Multiple  0   Live Births  1              Patient Active Problem List   Diagnosis Date Noted  . Status post foot surgery 06/27/2013  . Other hammer toe (acquired) 06/10/2013  . Pain in foot 06/10/2013  . Onychomycosis 06/10/2013    Past Medical History:  Diagnosis Date  . COPD (chronic obstructive pulmonary disease) ([place])   . Glaucoma   . History of recurrent UTIs   . Hyperlipidemia   . Hypertension   . Lichen sclerosus January 2017   vulva/perianal region  . Non-melanoma skin cancer 08/2015   right leg  . Osteopenia   . Osteoporosis   . Post-menopausal      Past Surgical History:  Procedure Laterality Date  . FOOT SURGERY Right 06/2013   right 2nd toe  . HEMATOMA EVACUATION Left 2007   left calf - excision of hemangioma with thrombus    Current Outpatient Medications  Medication Sig Dispense Refill  . albuterol (PROAIR HFA) 108 (90 BASE) MCG/ACT inhaler Inhale 2 puffs into the lungs every 6 (six) hours as needed for wheezing or shortness of breath.    Marland Kitchen aspirin 81 MG tablet Take 81 mg by mouth daily.    Marland Kitchen  atorvastatin (LIPITOR) 20 MG tablet Take 1 tablet by mouth at bedtime.  11  . betamethasone valerate ointment (VALISONE) 0.1 % Apply a pea sized amount 3 x a week 30 g 3  . budesonide-formoterol (SYMBICORT) 160-4.5 MCG/ACT inhaler Inhale 2 puffs into the lungs every morning.    . calcium carbonate (OS-CAL) 600 MG TABS tablet Take 600 mg by mouth 2 (two) times daily with a meal.    . Cholecalciferol (VITAMIN D-3) 1000 UNITS CAPS Take by mouth daily.    . clobetasol ointment (TEMOVATE) 3.76 % Apply 1 application topically 2 (two) times daily. Apply a small amount to the perianal area twice daily for 2 weeks 30 g 0  . Coenzyme Q10 (COQ-10 PO) Take by mouth.    . conjugated estrogens (PREMARIN) vaginal cream Use 1/2 g to the vulva every night at bed time for the first 2 weeks, then use 1/2 g to the vulva two times per week. 30 g 1  . diclofenac sodium (VOLTAREN) 1 % GEL Apply topically 4 (four) times daily.    . famotidine (PEPCID) 20 MG tablet Take 1 tablet (20 mg total) by mouth 2 (two) times daily. 30 tablet  0  . latanoprost (XALATAN) 0.005 % ophthalmic solution Place 1 drop into both eyes at bedtime.    . lidocaine (XYLOCAINE) 5 % ointment Apply 1 application topically 4 (four) times daily as needed. 30 g 0  . losartan (COZAAR) 100 MG tablet     . mirabegron ER (MYRBETRIQ) 25 MG TB24 tablet Take 1 tablet (25 mg total) by mouth daily for 30 days. 90 tablet 3  . Misc Natural Products (FOCUSED MIND PO) Take by mouth daily.    . Multiple Vitamins-Minerals (CENTRUM SILVER ADULT 50+ PO) Take by mouth daily.    . Omega-3 Fatty Acids (FISH OIL) 1000 MG CAPS Take 1 capsule by mouth daily.    . polyethylene glycol (MIRALAX / GLYCOLAX) 17 g packet Take 17 g by mouth daily. Every other day    . triamcinolone cream (KENALOG) 0.1 % Apply 1 application topically 2 (two) times daily. 30 g 0  . vitamin C (ASCORBIC ACID) 250 MG tablet Take 250 mg by mouth daily.    . Cetirizine HCl 10 MG CAPS Take 1 capsule (10 mg  total) by mouth daily for 10 days. 10 capsule 0   No current facility-administered medications for this visit.      ALLERGIES: Contrast media [iodinated diagnostic agents], Ether, and Iohexol  Family History  Problem Relation Age of Onset  . Breast cancer Mother        deceased -60's  . Heart attack Father   . Heart attack Brother     Social History   Socioeconomic History  . Marital status: Widowed    Spouse name: Not on file  . Number of children: Not on file  . Years of education: Not on file  . Highest education level: Not on file  Occupational History  . Not on file  Social Needs  . Financial resource strain: Not on file  . Food insecurity    Worry: Not on file    Inability: Not on file  . Transportation needs    Medical: Not on file    Non-medical: Not on file  Tobacco Use  . Smoking status: Never Smoker  . Smokeless tobacco: Never Used  Substance and Sexual Activity  . Alcohol use: Not Currently  . Drug use: No  . Sexual activity: Not Currently    Birth control/protection: Post-menopausal  Lifestyle  . Physical activity    Days per week: Not on file    Minutes per session: Not on file  . Stress: Not on file  Relationships  . Social Herbalist on phone: Not on file    Gets together: Not on file    Attends religious service: Not on file    Active member of club or organization: Not on file    Attends meetings of clubs or organizations: Not on file    Relationship status: Not on file  . Intimate partner violence    Fear of current or ex partner: Not on file    Emotionally abused: Not on file    Physically abused: Not on file    Forced sexual activity: Not on file  Other Topics Concern  . Not on file  Social History Narrative  . Not on file    Review of Systems  Constitutional: Negative.   HENT: Negative.   Eyes: Negative.   Respiratory: Negative.   Cardiovascular: Negative.   Gastrointestinal: Positive for diarrhea.       Rectal  pain  Genitourinary: Negative.  Musculoskeletal: Negative.   Skin: Negative.   Neurological: Negative.   Endo/Heme/Allergies: Negative.   Psychiatric/Behavioral: Negative.     PHYSICAL EXAMINATION:    BP 122/84 (BP Location: Right Arm, Patient Position: Sitting, Cuff Size: Normal)   Pulse 64   Temp 98.2 F (36.8 C) (Skin)   Wt 185 lb 3.2 oz (84 kg)   LMP 08/14/1985 (Approximate)   BMI 30.82 kg/m     General appearance: alert, cooperative and appears stated age  Pelvic: External genitalia: diffuse whitening, mild agglutination, thin tissue with ecchymosis on the left.  Perianal region: whitening, thickening of the skin with fissures. Anal fissure at 6 o'clock               Chaperone was present for exam.  ASSESSMENT Lichen sclerosis is stable Perianal dermatitis has worsened Non healing anal fissure Having trouble with her bowel, goes from constipation to diarrhea    PLAN Use the steroid ointment on the vulva as needed up to 1-2 x a week, not daily For the next week use the clobetasol ointment in the perianal region 1 x a day, then space to 2 x a week. Use Vaseline daily F/u with GI for her non healing anal fissure and bowel issues F/U here in 9/20 for an annual exam, sooner with concerns.    An After Visit Summary was printed and given to the patient.  ~15 minutes face to face time of which over 50% was spent in counseling.

## 2019-02-27 NOTE — Patient Instructions (Signed)
Use the clobetasol (Temovate) in the perianal region every day for one week, then space out to 2 x a week. Use on the vulva as needed 1-2 x a week.

## 2019-03-01 ENCOUNTER — Other Ambulatory Visit: Payer: Self-pay | Admitting: Obstetrics and Gynecology

## 2019-04-02 ENCOUNTER — Other Ambulatory Visit: Payer: Self-pay | Admitting: Internal Medicine

## 2019-04-02 DIAGNOSIS — Z1231 Encounter for screening mammogram for malignant neoplasm of breast: Secondary | ICD-10-CM

## 2019-04-30 ENCOUNTER — Encounter: Payer: Self-pay | Admitting: Orthopedic Surgery

## 2019-04-30 ENCOUNTER — Ambulatory Visit (INDEPENDENT_AMBULATORY_CARE_PROVIDER_SITE_OTHER): Payer: Medicare Other | Admitting: Orthopedic Surgery

## 2019-04-30 ENCOUNTER — Other Ambulatory Visit: Payer: Self-pay

## 2019-04-30 DIAGNOSIS — M1711 Unilateral primary osteoarthritis, right knee: Secondary | ICD-10-CM

## 2019-04-30 DIAGNOSIS — M1712 Unilateral primary osteoarthritis, left knee: Secondary | ICD-10-CM

## 2019-05-02 ENCOUNTER — Encounter: Payer: Self-pay | Admitting: Orthopedic Surgery

## 2019-05-02 DIAGNOSIS — M1711 Unilateral primary osteoarthritis, right knee: Secondary | ICD-10-CM

## 2019-05-02 NOTE — Progress Notes (Signed)
   Procedure Note  Patient: [name]             Date of Birth: 10-May-1946           MRN: [id number]             Visit Date: 04/30/2019  Procedures: Visit Diagnoses: No diagnosis found.  Large Joint Inj: R knee on 05/02/2019 5:03 PM Indications: diagnostic evaluation, joint swelling and pain Details: 18 G 1.5 in needle, superolateral approach  Arthrogram: No  Medications: 5 mL lidocaine 1 %; 40 mg methylPREDNISolone acetate 40 MG/ML; 4 mL bupivacaine 0.25 % Outcome: tolerated well, no immediate complications  Also notes some left knee pain but denies wanting any cortisone injection for this knee. She reports about 50 pounds of unintentional weight loss since March of this year.  Highly recommended the patient follow-up with her PCP regarding this weight loss and the acute dizziness/lightheadedness that she complains of.  Patient agreed with this plan.  She will follow-up PRN for knee injections. Procedure, treatment alternatives, risks and benefits explained, specific risks discussed. Consent was given by the patient. Immediately prior to procedure a time out was called to verify the correct patient, procedure, equipment, support staff and site/side marked as required. Patient was prepped and draped in the usual sterile fashion.

## 2019-05-04 MED ORDER — METHYLPREDNISOLONE ACETATE 40 MG/ML IJ SUSP
40.0000 mg | INTRAMUSCULAR | Status: AC | PRN
  Administered 2019-05-02: 40 mg via INTRA_ARTICULAR

## 2019-05-04 MED ORDER — BUPIVACAINE HCL 0.25 % IJ SOLN
4.0000 mL | INTRAMUSCULAR | Status: AC | PRN
  Administered 2019-05-02: 4 mL via INTRA_ARTICULAR

## 2019-05-04 MED ORDER — LIDOCAINE HCL 1 % IJ SOLN
5.0000 mL | INTRAMUSCULAR | Status: AC | PRN
  Administered 2019-05-02: 5 mL

## 2019-05-05 ENCOUNTER — Other Ambulatory Visit: Payer: Self-pay

## 2019-05-06 NOTE — Progress Notes (Signed)
73 y.o. [id number] Widowed White or Caucasian Not Hispanic or Latino female here for annual exam.   The patient has had issues with recurrent severe vulvitis and perianal dermatitis. She has a h/o lichen sclerosis, vulvar atrophy and has had issues with an anal fissure.  She has seen a dermatologist. Her vulvitis and perianal dermatitis are better. The dermatologist gave her a new cream that she is using 2 x a week (not sure what the name of it is). She isn't using the premarin cream or the steroid ointment I gave her.     Her primary recently diagnosed with prediabetes and stage III kidney disease. She has a h/o osteoporosis, previously didn't tolerate prolia. Was previously on actonel, doesn't remember how she did on it.   She recently had a steroid injection in her right knee recently, which helped her arthritis pain.   She isn't taking the myrbetriq, it was giving her a headache. Leakage is better, she just voids frequently, voids normal amounts.   Bowels are better.   Patient's last menstrual period was 08/14/1985 (approximate).          Sexually active: No.  The current method of family planning is post menopausal status.    Exercising: Yes.    walking Smoker:  no  Health Maintenance: Pap:07/28/2016 WNL,  08/15/2012  Negative with neg HR HPV Abnormal pap history: No  MMG:04/29/2018 Birads 1 negative, scheduled for 05/15/2019 Colonoscopy:1/20, normal. No f/u needed BMD:07/18/2018 Osteoporosis. She was previously on prolia, had side effects.  TDaP: 2019 with PCP    reports that she has never smoked. She has never used smokeless tobacco. She reports previous alcohol use. She reports that she does not use drugs. Son is local.   Past Medical History:  Diagnosis Date  . COPD (chronic obstructive pulmonary disease) ([place])   . Glaucoma   . History of recurrent UTIs   . Hyperlipidemia   . Hypertension   . Lichen sclerosus January 2017   vulva/perianal region  . Non-melanoma skin  cancer 08/2015   right leg  . Osteopenia   . Osteoporosis   . Post-menopausal      Past Surgical History:  Procedure Laterality Date  . FOOT SURGERY Right 06/2013   right 2nd toe  . HEMATOMA EVACUATION Left 2007   left calf - excision of hemangioma with thrombus    Current Outpatient Medications  Medication Sig Dispense Refill  . albuterol (PROAIR HFA) 108 (90 BASE) MCG/ACT inhaler Inhale 2 puffs into the lungs every 6 (six) hours as needed for wheezing or shortness of breath.    Marland Kitchen aspirin 81 MG tablet Take 81 mg by mouth daily.    Marland Kitchen atorvastatin (LIPITOR) 20 MG tablet Take 1 tablet by mouth at bedtime.  11  . budesonide-formoterol (SYMBICORT) 160-4.5 MCG/ACT inhaler Inhale 2 puffs into the lungs every morning.    . calcium carbonate (OS-CAL) 600 MG TABS tablet Take 600 mg by mouth 2 (two) times daily with a meal.    . Cholecalciferol (VITAMIN D-3) 1000 UNITS CAPS Take by mouth daily.    . Coenzyme Q10 (COQ-10 PO) Take by mouth.    . diclofenac sodium (VOLTAREN) 1 % GEL Apply topically 4 (four) times daily.    . famotidine (PEPCID) 20 MG tablet Take 1 tablet (20 mg total) by mouth 2 (two) times daily. 30 tablet 0  . fluocinonide cream (LIDEX) [id number] % Apply 1 application topically 2 (two) times a week.    . latanoprost (XALATAN) 0.005 %  ophthalmic solution Place 1 drop into both eyes at bedtime.    Marland Kitchen losartan (COZAAR) 100 MG tablet     . Misc Natural Products (FOCUSED MIND PO) Take by mouth daily.    . Multiple Vitamins-Minerals (CENTRUM SILVER ADULT 50+ PO) Take by mouth daily.    . Omega-3 Fatty Acids (FISH OIL) 1000 MG CAPS Take 1 capsule by mouth daily.    . polyethylene glycol (MIRALAX / GLYCOLAX) 17 g packet Take 17 g by mouth daily. Every other day    . vitamin C (ASCORBIC ACID) 250 MG tablet Take 250 mg by mouth daily.    . betamethasone valerate ointment (VALISONE) 0.1 % Apply a pea sized amount 3 x a week (Patient not taking: Reported on 05/07/2019) 30 g 3  . Cetirizine HCl  10 MG CAPS Take 1 capsule (10 mg total) by mouth daily for 10 days. 10 capsule 0  . clobetasol ointment (TEMOVATE) [id number] % Apply 1 application topically 2 (two) times daily. Apply a small amount to the perianal area twice daily for 2 weeks (Patient not taking: Reported on 05/07/2019) 30 g 0  . conjugated estrogens (PREMARIN) vaginal cream Use 1/2 g to the vulva every night at bed time for the first 2 weeks, then use 1/2 g to the vulva two times per week. (Patient not taking: Reported on 05/07/2019) 30 g 1  . lidocaine (XYLOCAINE) 5 % ointment Apply 1 application topically 4 (four) times daily as needed. (Patient not taking: Reported on 05/07/2019) 30 g 0  . mirabegron ER (MYRBETRIQ) 25 MG TB24 tablet Take 1 tablet (25 mg total) by mouth daily for 30 days. 90 tablet 3  . triamcinolone cream (KENALOG) 0.1 % Apply 1 application topically 2 (two) times daily. (Patient not taking: Reported on 05/07/2019) 30 g 0   No current facility-administered medications for this visit.     Family History  Problem Relation Age of Onset  . Breast cancer Mother        deceased -70's  . Heart attack Father   . Heart attack Brother     Review of Systems  Constitutional: Negative.   HENT: Negative.   Eyes: Negative.   Respiratory: Negative.   Cardiovascular: Negative.   Gastrointestinal: Negative.   Endocrine: Negative.   Genitourinary: Negative.   Musculoskeletal: Negative.   Skin: Negative.   Allergic/Immunologic: Negative.   Neurological: Negative.   Hematological: Negative.   Psychiatric/Behavioral: Negative.     Exam:   BP 132/90 (BP Location: Right Arm, Patient Position: Sitting, Cuff Size: Normal)   Pulse 76   Temp (!) 97.2 F (36.2 C) (Skin)   Ht 5\' 5"  (1.651 m)   Wt 184 lb 4.8 oz (83.6 kg)   LMP 08/14/1985 (Approximate)   BMI 30.67 kg/m   Weight change: @WEIGHTCHANGE @ Height:   Height: 5\' 5"  (165.1 cm)  Ht Readings from Last 3 Encounters:  05/07/19 5\' 5"  (1.651 m)  12/27/18 5\' 5"  (1.651 m)   01/08/18 5\' 5"  (1.651 m)    General appearance: alert, cooperative and appears stated age Head: Normocephalic, without obvious abnormality, atraumatic Neck: no adenopathy, supple, symmetrical, trachea midline and thyroid normal to inspection and palpation Lungs: clear to auscultation bilaterally Cardiovascular: regular rate and rhythm Breasts: normal appearance, no masses or tenderness Abdomen: soft, non-tender; non distended,  no masses,  no organomegaly Extremities: extremities normal, atraumatic, no cyanosis or edema Skin: Skin color, texture, turgor normal. No rashes or lesions Lymph nodes: Cervical, supraclavicular, and axillary nodes normal.  No abnormal inguinal nodes palpated Neurologic: Grossly normal   Pelvic: External genitalia:  Diffuse whitening, mild ecchymosis on the left vulva, agglutination of labia minora to majora              Urethra:  normal appearing urethra with no masses, tenderness or lesions              Bartholins and Skenes: normal                 Vagina: normal appearing vagina with normal color and discharge, no lesions              Cervix: no lesions               Bimanual Exam:  Uterus:  normal size, contour, position, consistency, mobility, non-tender              Adnexa: no mass, fullness, tenderness               Rectovaginal: Confirms               Anus:  normal sphincter tone, no lesions  Perianal area with diffuse whitening, some fissures posterior to the anus.   Chaperone was present for exam.  A:  Well Woman with normal exam  Lichen sclerosis  H/O perianal dermatitis  Recently diagnosed with prediabetes and stage III kidney disease  Osteoporis  P:   She will talk to her primary about referral to Nephrology  Discussed option of nutrition counseling  Mammogram schedule  Discussed breast self exam  Discussed calcium and vit D intake  She will further discuss osteoporosis with her primary

## 2019-05-07 ENCOUNTER — Encounter: Payer: Self-pay | Admitting: Obstetrics and Gynecology

## 2019-05-07 ENCOUNTER — Other Ambulatory Visit: Payer: Self-pay

## 2019-05-07 ENCOUNTER — Ambulatory Visit (INDEPENDENT_AMBULATORY_CARE_PROVIDER_SITE_OTHER): Payer: Medicare Other | Admitting: Obstetrics and Gynecology

## 2019-05-07 VITALS — BP 132/90 | HR 76 | Temp 97.2°F | Ht 65.0 in | Wt 184.3 lb

## 2019-05-07 DIAGNOSIS — Z01419 Encounter for gynecological examination (general) (routine) without abnormal findings: Secondary | ICD-10-CM | POA: Diagnosis not present

## 2019-05-07 DIAGNOSIS — Z8739 Personal history of other diseases of the musculoskeletal system and connective tissue: Secondary | ICD-10-CM

## 2019-05-07 DIAGNOSIS — L900 Lichen sclerosus et atrophicus: Secondary | ICD-10-CM | POA: Diagnosis not present

## 2019-05-07 DIAGNOSIS — R7303 Prediabetes: Secondary | ICD-10-CM | POA: Diagnosis not present

## 2019-05-07 DIAGNOSIS — Z87448 Personal history of other diseases of urinary system: Secondary | ICD-10-CM

## 2019-05-07 NOTE — Patient Instructions (Signed)
EXERCISE AND DIET:  We recommended that you start or continue a regular exercise program for good health. Regular exercise means any activity that makes your heart beat faster and makes you sweat.  We recommend exercising at least 30 minutes per day at least 3 days a week, preferably 4 or 5.  We also recommend a diet low in fat and sugar.  Inactivity, poor dietary choices and obesity can cause diabetes, heart attack, stroke, and kidney damage, among others.   ° °ALCOHOL AND SMOKING:  Women should limit their alcohol intake to no more than 7 drinks/beers/glasses of wine (combined, not each!) per week. Moderation of alcohol intake to this level decreases your risk of breast cancer and liver damage. And of course, no recreational drugs are part of a healthy lifestyle.  And absolutely no smoking or even second hand smoke. Most people know smoking can cause heart and lung diseases, but did you know it also contributes to weakening of your bones? Aging of your skin?  Yellowing of your teeth and nails? ° °CALCIUM AND VITAMIN D:  Adequate intake of calcium and Vitamin D are recommended.  The recommendations for exact amounts of these supplements seem to change often, but generally speaking 1,200 mg of calcium (between diet and supplement) and 800 units of Vitamin D per day seems prudent. Certain women may benefit from higher intake of Vitamin D.  If you are among these women, your doctor will have told you during your visit.   ° °PAP SMEARS:  Pap smears, to check for cervical cancer or precancers,  have traditionally been done yearly, although recent scientific advances have shown that most women can have pap smears less often.  However, every woman still should have a physical exam from her gynecologist every year. It will include a breast check, inspection of the vulva and vagina to check for abnormal growths or skin changes, a visual exam of the cervix, and then an exam to evaluate the size and shape of the uterus and  ovaries.  And after 75 years of age, a rectal exam is indicated to check for rectal cancers. We will also provide age appropriate advice regarding health maintenance, like when you should have certain vaccines, screening for sexually transmitted diseases, bone density testing, colonoscopy, mammograms, etc.  ° °MAMMOGRAMS:  All women over 40 years old should have a yearly mammogram. Many facilities now offer a "3D" mammogram, which may cost around $50 extra out of pocket. If possible,  we recommend you accept the option to have the 3D mammogram performed.  It both reduces the number of women who will be called back for extra views which then turn out to be normal, and it is better than the routine mammogram at detecting truly abnormal areas.   ° °COLON CANCER SCREENING: Now recommend starting at age 45. At this time colonoscopy is not covered for routine screening until 50. There are take home tests that can be done between 45-49.  ° °COLONOSCOPY:  Colonoscopy to screen for colon cancer is recommended for all women at age 50.  We know, you hate the idea of the prep.  We agree, BUT, having colon cancer and not knowing it is worse!!  Colon cancer so often starts as a polyp that can be seen and removed at colonscopy, which can quite literally save your life!  And if your first colonoscopy is normal and you have no family history of colon cancer, most women don't have to have it again for   10 years.  Once every ten years, you can do something that may end up saving your life, right?  We will be happy to help you get it scheduled when you are ready.  Be sure to check your insurance coverage so you understand how much it will cost.  It may be covered as a preventative service at no cost, but you should check your particular policy.   ° ° ° °Breast Self-Awareness °Breast self-awareness means being familiar with how your breasts look and feel. It involves checking your breasts regularly and reporting any changes to your  health care provider. °Practicing breast self-awareness is important. A change in your breasts can be a sign of a serious medical problem. Being familiar with how your breasts look and feel allows you to find any problems early, when treatment is more likely to be successful. All women should practice breast self-awareness, including women who have had breast implants. °How to do a breast self-exam °One way to learn what is normal for your breasts and whether your breasts are changing is to do a breast self-exam. To do a breast self-exam: °Look for Changes ° °1. Remove all the clothing above your waist. °2. Stand in front of a mirror in a room with good lighting. °3. Put your hands on your hips. °4. Push your hands firmly downward. °5. Compare your breasts in the mirror. Look for differences between them (asymmetry), such as: °? Differences in shape. °? Differences in size. °? Puckers, dips, and bumps in one breast and not the other. °6. Look at each breast for changes in your skin, such as: °? Redness. °? Scaly areas. °7. Look for changes in your nipples, such as: °? Discharge. °? Bleeding. °? Dimpling. °? Redness. °? A change in position. °Feel for Changes °Carefully feel your breasts for lumps and changes. It is best to do this while lying on your back on the floor and again while sitting or standing in the shower or tub with soapy water on your skin. Feel each breast in the following way: °· Place the arm on the side of the breast you are examining above your head. °· Feel your breast with the other hand. °· Start in the nipple area and make ¾ inch (2 cm) overlapping circles to feel your breast. Use the pads of your three middle fingers to do this. Apply light pressure, then medium pressure, then firm pressure. The light pressure will allow you to feel the tissue closest to the skin. The medium pressure will allow you to feel the tissue that is a little deeper. The firm pressure will allow you to feel the tissue  close to the ribs. °· Continue the overlapping circles, moving downward over the breast until you feel your ribs below your breast. °· Move one finger-width toward the center of the body. Continue to use the ¾ inch (2 cm) overlapping circles to feel your breast as you move slowly up toward your collarbone. °· Continue the up and down exam using all three pressures until you reach your armpit. ° °Write Down What You Find ° °Write down what is normal for each breast and any changes that you find. Keep a written record with breast changes or normal findings for each breast. By writing this information down, you do not need to depend only on memory for size, tenderness, or location. Write down where you are in your menstrual cycle, if you are still menstruating. °If you are having trouble noticing differences   in your breasts, do not get discouraged. With time you will become more familiar with the variations in your breasts and more comfortable with the exam. How often should I examine my breasts? Examine your breasts every month. If you are breastfeeding, the best time to examine your breasts is after a feeding or after using a breast pump. If you menstruate, the best time to examine your breasts is 5-7 days after your period is over. During your period, your breasts are lumpier, and it may be more difficult to notice changes. When should I see my health care provider? See your health care provider if you notice:  A change in shape or size of your breasts or nipples.  A change in the skin of your breast or nipples, such as a reddened or scaly area.  Unusual discharge from your nipples.  A lump or thick area that was not there before.  Pain in your breasts.  Anything that concerns you.   Osteoporosis  Osteoporosis is thinning and loss of density in your bones. Osteoporosis makes bones more brittle and fragile and more likely to break (fracture). Over time, osteoporosis can cause your bones to become  so weak that they fracture after a minor fall. Bones in the hip, wrist, and spine are most likely to fracture due to osteoporosis. What are the causes? The exact cause of this condition is not known. What increases the risk? You may be at greater risk for osteoporosis if you:  Have a family history of the condition.  Have poor nutrition.  Use steroid medicines, such as prednisone.  Are female.  Are age 85 or older.  Smoke or have a history of smoking.  Are not physically active (are sedentary).  Are white (Caucasian) or of Asian descent.  Have a small body frame.  Take certain medicines, such as antiseizure medicines. What are the signs or symptoms? A fracture might be the first sign of osteoporosis, especially if the fracture results from a fall or injury that usually would not cause a bone to break. Other signs and symptoms include:  Pain in the neck or low back.  Stooped posture.  Loss of height. How is this diagnosed? This condition may be diagnosed based on:  Your medical history.  A physical exam.  A bone mineral density test, also called a DXA or DEXA test (dual-energy X-ray absorptiometry test). This test uses X-rays to measure the amount of minerals in your bones. How is this treated? The goal of treatment is to strengthen your bones and lower your risk for a fracture. Treatment may involve:  Making lifestyle changes, such as: ? Including foods with more calcium and vitamin D in your diet. ? Doing weight-bearing and muscle-strengthening exercises. ? Stopping tobacco use. ? Limiting alcohol intake.  Taking medicine to slow the process of bone loss or to increase bone density.  Taking daily supplements of calcium and vitamin D.  Taking hormone replacement medicines, such as estrogen for women and testosterone for men.  Monitoring your levels of calcium and vitamin D. Follow these instructions at home:  Activity  Exercise as told by your health care  provider. Ask your health care provider what exercises and activities are safe for you. You should do: ? Exercises that make you work against gravity (weight-bearing exercises), such as tai chi, yoga, or walking. ? Exercises to strengthen muscles, such as lifting weights. Lifestyle  Limit alcohol intake to no more than 1 drink a day for nonpregnant women and  2 drinks a day for men. One drink equals 12 oz of beer, 5 oz of wine, or 1 oz of hard liquor.  Do not use any products that contain nicotine or tobacco, such as cigarettes and e-cigarettes. If you need help quitting, ask your health care provider. Preventing falls  Use devices to help you move around (mobility aids) as needed, such as canes, walkers, scooters, or crutches.  Keep rooms well-lit and clutter-free.  Remove tripping hazards from walkways, including cords and throw rugs.  Install grab bars in bathrooms and safety rails on stairs.  Use rubber mats in the bathroom and other areas that are often wet or slippery.  Wear closed-toe shoes that fit well and support your feet. Wear shoes that have rubber soles or low heels.  Review your medicines with your health care provider. Some medicines can cause dizziness or changes in blood pressure, which can increase your risk of falling. General instructions  Include calcium and vitamin D in your diet. Calcium is important for bone health, and vitamin D helps your body to absorb calcium. Good sources of calcium and vitamin D include: ? Certain fatty fish, such as salmon and tuna. ? Products that have calcium and vitamin D added to them (fortified products), such as fortified cereals. ? Egg yolks. ? Cheese. ? Liver.  Take over-the-counter and prescription medicines only as told by your health care provider.  Keep all follow-up visits as told by your health care provider. This is important. Contact a health care provider if:  You have never been screened for osteoporosis and you  are: ? A woman who is age 50 or older. ? A man who is age 48 or older. Get help right away if:  You fall or injure yourself. Summary  Osteoporosis is thinning and loss of density in your bones. This makes bones more brittle and fragile and more likely to break (fracture),even with minor falls.  The goal of treatment is to strengthen your bones and reduce your risk for a fracture.  Include calcium and vitamin D in your diet. Calcium is important for bone health, and vitamin D helps your body to absorb calcium.  Talk with your health care provider about screening for osteoporosis if you are a woman who is age 10 or older, or a man who is age 12 or older. This information is not intended to replace advice given to you by your health care provider. Make sure you discuss any questions you have with your health care provider. Document Released: 05/10/2005 Document Revised: 07/13/2017 Document Reviewed: 05/25/2017 Elsevier Patient Education  2020 [employer].

## 2019-05-15 ENCOUNTER — Ambulatory Visit
Admission: RE | Admit: 2019-05-15 | Discharge: 2019-05-15 | Disposition: A | Discharge status: Home / Self Care | Payer: Medicare Other | Source: Ambulatory Visit | Attending: Internal Medicine | Admitting: Internal Medicine

## 2019-05-15 ENCOUNTER — Other Ambulatory Visit: Payer: Self-pay

## 2019-05-15 DIAGNOSIS — Z1231 Encounter for screening mammogram for malignant neoplasm of breast: Secondary | ICD-10-CM

## 2019-05-15 IMAGING — MG MM DIGITAL SCREENING BILAT W/ TOMO W/ CAD
6 of 12 series · 6 of 36 positions shown · non-contrast
Comparison: Previous exam(s).

CLINICAL DATA: Screening.

EXAM:
DIGITAL SCREENING BILATERAL MAMMOGRAM WITH TOMO AND CAD

[L CC synth-2D (1 of 2)]
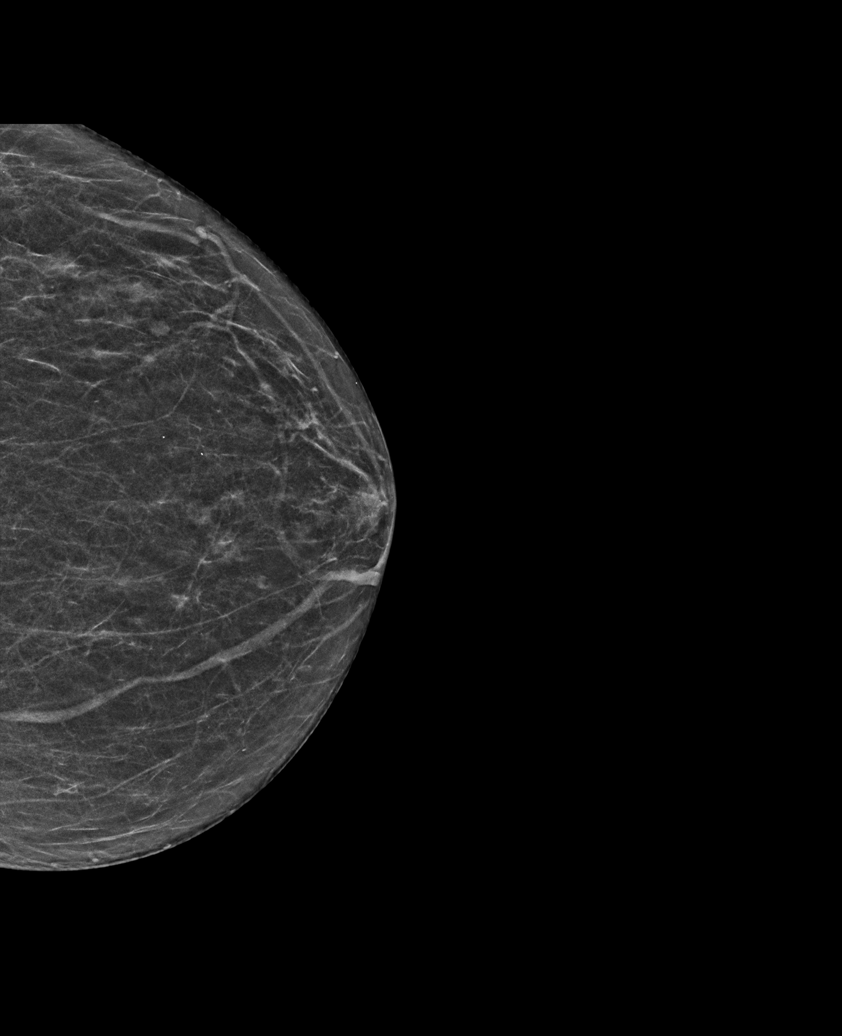

[L MLO synth-2D]
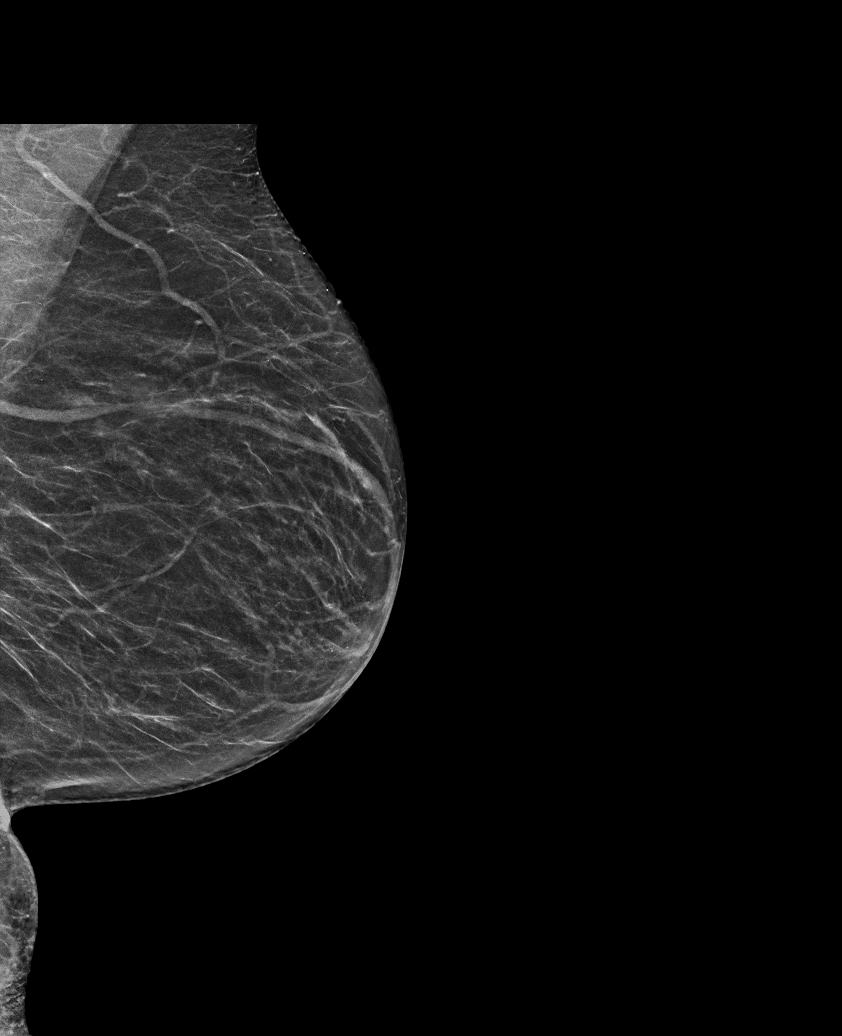

[L CC synth-2D (2 of 2)]
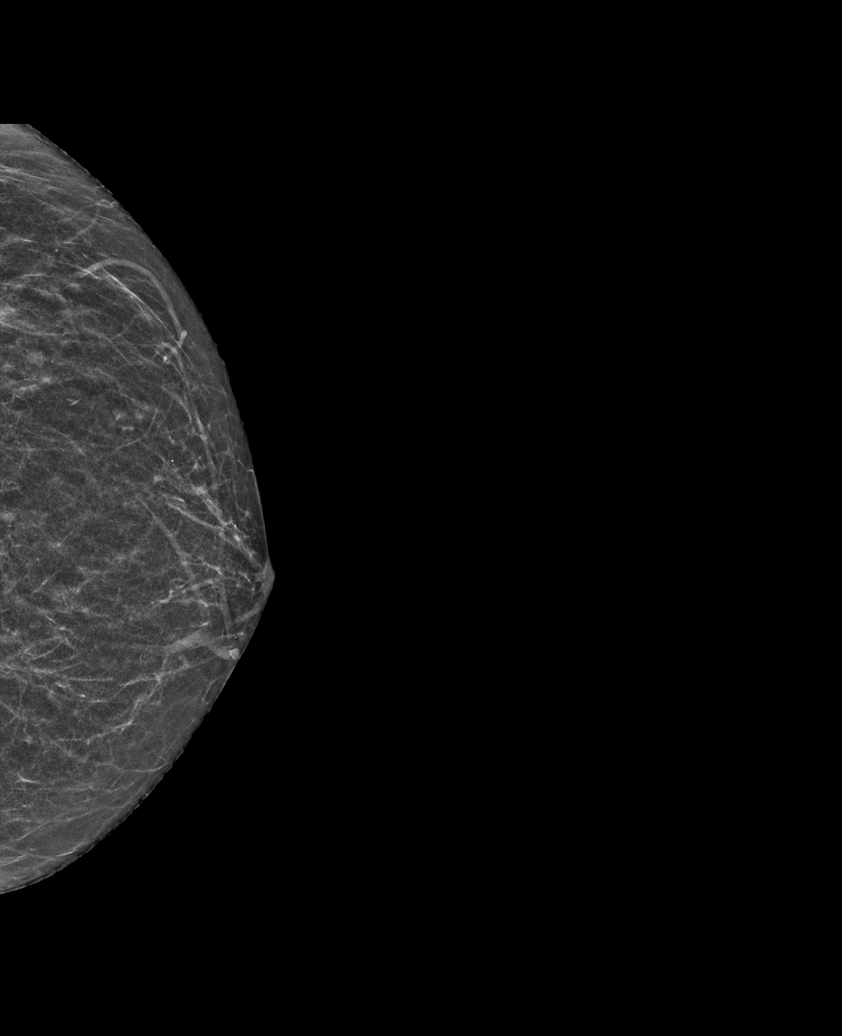

[R CC synth-2D (1 of 2)]
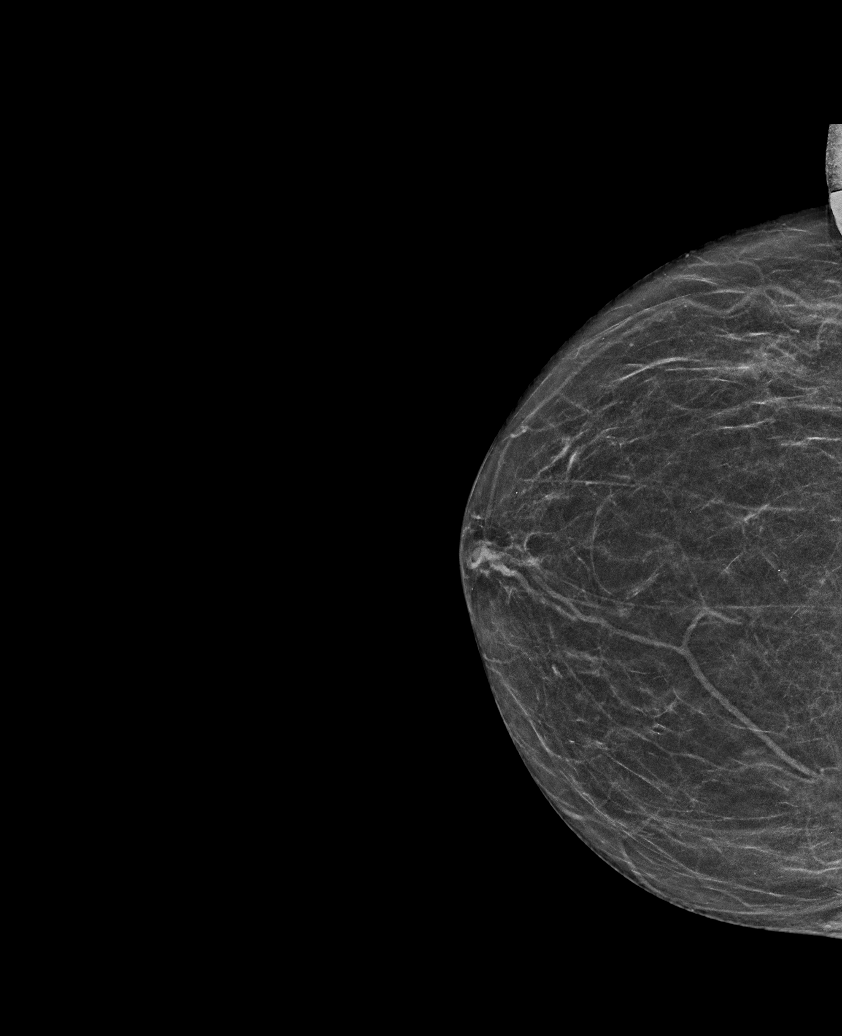

[R CC synth-2D (2 of 2)]
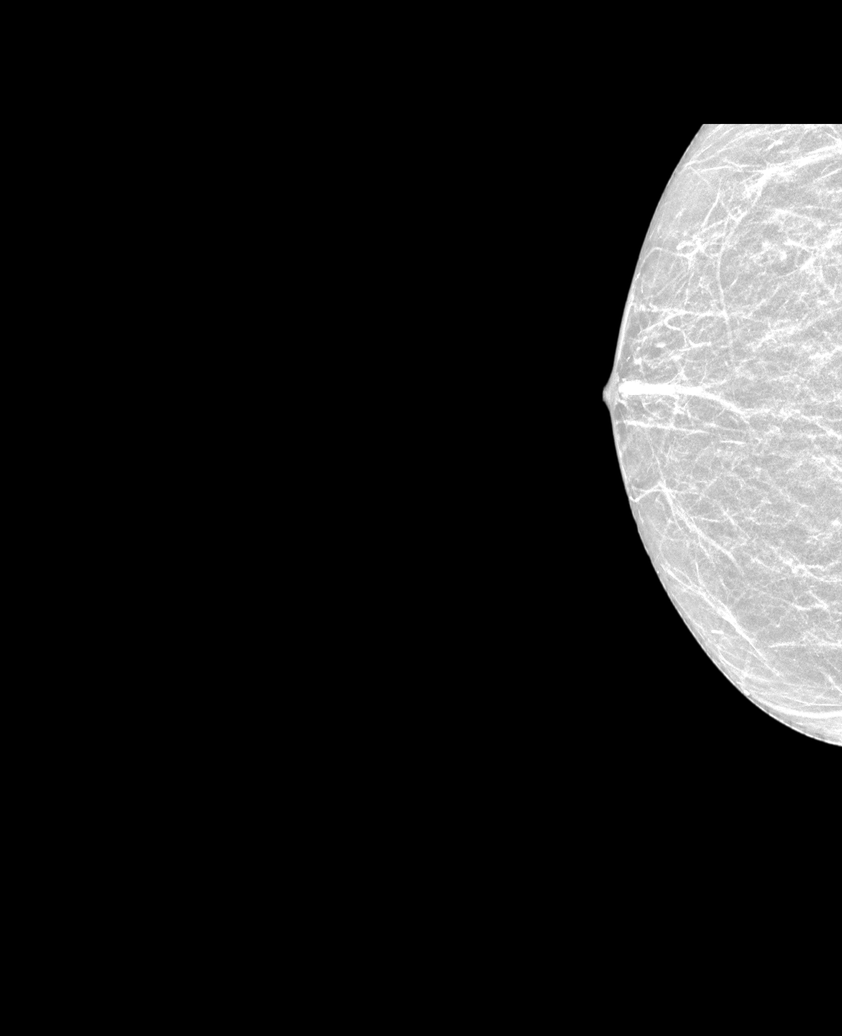

[R MLO synth-2D]
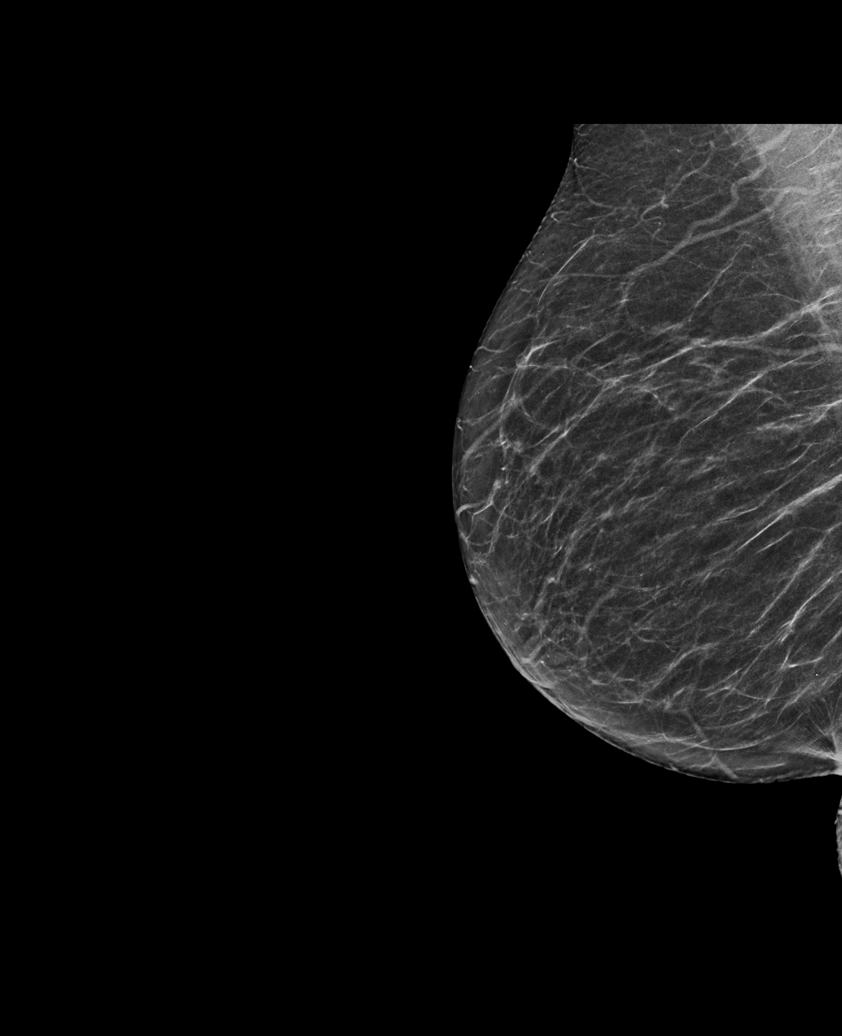

[6 of 36 positions shown; findings below may reference images not displayed]

ACR Breast Density Category b: There are scattered areas of
fibroglandular density.
FINDINGS: There are no findings suspicious for malignancy. Images were
processed with CAD.
IMPRESSION: No mammographic evidence of malignancy. A result letter of this
screening mammogram will be mailed directly to the patient.

RECOMMENDATION:
Screening mammogram in one year. (Code:[TQ])

BI-RADS CATEGORY  1: Negative.

## 2019-06-10 ENCOUNTER — Other Ambulatory Visit: Payer: Self-pay | Admitting: Obstetrics and Gynecology

## 2019-06-11 ENCOUNTER — Telehealth: Payer: Self-pay | Admitting: Obstetrics and Gynecology

## 2019-06-11 ENCOUNTER — Encounter: Payer: Self-pay | Admitting: Obstetrics and Gynecology

## 2019-06-11 DIAGNOSIS — Z87448 Personal history of other diseases of urinary system: Secondary | ICD-10-CM

## 2019-06-11 HISTORY — DX: Personal history of other diseases of urinary system: Z87.448

## 2019-06-11 NOTE — Telephone Encounter (Signed)
Patient want to discuss changing medication.

## 2019-06-11 NOTE — Telephone Encounter (Signed)
Call to patient. Patient states that every time she takes the [facility], she gets a terrible headache. States she takes the Myrbetriq on an as needed basis when she has to go somewhere. Requesting to stop the Myrbetriq and switch back to the Trospium chloride if Dr. Talbert Nan is okay with that. Patient states she was told by [facility]ist at [facility] could get that medication. Pharmacy confirmed as [facility] [address].   Patient also states the area in "the front is worse again." States she is having a lot of irritation, itching and soreness. Used her 2 creams this morning (unsure of names, but states the yellow and orange tubes) and states the area feels better now. OV offered, declines at this time. States she will keep using her cream and return call if area worsens.   Routing to provider for review.

## 2019-06-11 NOTE — Telephone Encounter (Signed)
Call to patient. Patient states last visit with Dr. Mellody Drown was in August. Copy of labs in chart from 03-17-2019. Patient states no one in Dr. Adela Ports office called her to tell her about the kidney disease. Patient states she thought it was "odd" that she only got a letter in the mail about the kidney disease. States she has not discussed with Dr. Mellody Drown. RN advised would update Dr. Talbert Nan.   Routing to provider for review.

## 2019-06-11 NOTE — Telephone Encounter (Signed)
The patient reported recent diagnosis of kidney disease at her last visit. I don't have any of her lab work. She might need a dose adjustment of Trospium depending on her lab work. Kidney disease could also increase the risk of adverse reactions from the trospium (including headache)

## 2019-06-12 NOTE — Telephone Encounter (Signed)
PA was denied in 12/2018 stating that the patient must try two covered alternatives before they are able to covering Trospuim Chloride. Alternatives are Myrbetriq, Oxybutynin ER, and Solifenacin.  Patient can't take oxybutin or vesicare because of her glaucoma.

## 2019-06-12 NOTE — Telephone Encounter (Signed)
Can you work on getting coverage for her please.

## 2019-06-12 NOTE — Telephone Encounter (Signed)
I found her labs. Her creatinine is mildly elevated at 1.04 and her GFR is mildly low at 53. No dose adjustment is needed at this level.  I was going to send in the Trospium 60 for her OAB, it doesn't look like it is covered by her insurance.

## 2019-06-14 ENCOUNTER — Other Ambulatory Visit: Payer: Self-pay | Admitting: Obstetrics and Gynecology

## 2019-06-16 NOTE — Telephone Encounter (Signed)
Patient is following up regarding PA.Marland Kitchen

## 2019-06-17 MED ORDER — TROSPIUM CHLORIDE ER 60 MG PO CP24: ORAL_CAPSULE | ORAL | 1 refills | Status: AC

## 2019-06-17 NOTE — Telephone Encounter (Signed)
PA submitted to plan via [email or web address] for Trospium Chloride 60 mg capsules.  [employer] Numbers - PA Case ID: [id number]   Approvedtoday Request Reference Number: [id number]. TROSPIUM CHL CAP 60MG  ER is approved through 08/13/2020. For further questions, call [phone].    Patient notified of PA response. Advised patient I will review RX for Trospium 60 mg caps with Dr. Talbert Nan. Patient request 30 day supply. Confirmed pharmacy on file. Patient agreeable.   Rx pended. Dr. Talbert Nan, please advise on RX and refills.

## 2019-06-17 NOTE — Telephone Encounter (Signed)
Script sent  

## 2019-06-18 NOTE — Telephone Encounter (Signed)
Encounter closed

## 2019-07-16 ENCOUNTER — Encounter: Payer: Self-pay | Admitting: Gastroenterology

## 2019-08-01 ENCOUNTER — Other Ambulatory Visit: Payer: Self-pay | Admitting: Obstetrics and Gynecology

## 2019-08-25 ENCOUNTER — Telehealth: Payer: Self-pay | Admitting: Obstetrics and Gynecology

## 2019-08-25 DIAGNOSIS — K602 Anal fissure, unspecified: Secondary | ICD-10-CM

## 2019-08-25 MED ORDER — BETAMETHASONE VALERATE 0.1 % EX OINT: TOPICAL_OINTMENT | CUTANEOUS | 0 refills | Status: DC

## 2019-08-25 MED ORDER — LIDOCAINE 5 % EX OINT: 1.0000 "application " | TOPICAL_OINTMENT | Freq: Four times a day (QID) | CUTANEOUS | 0 refills | Status: DC | PRN

## 2019-08-25 NOTE — Telephone Encounter (Signed)
Patient has a medication question. 

## 2019-08-25 NOTE — Telephone Encounter (Signed)
Spoke to pt. Pt states having pain since having rectal pain now and has been for a few days. Pt has Valisone ointment and lidocaine ointment at home and wanted to know if she can use that for anal fissure and pain. Pt has hx of anal fissures. Had OV last for fissure on 12/2017. Pt wants refill on both meds. Pt declines OV at this time. Pt states " hard to put on clothes for all the pain she is having". Pt encouraged to call back to office after using home Rx meds if doesn't improve sx. Pt agreeable. Has OV scheduled for 3 month f/u for lichen sclerosis on [id number].  Will route to Dr Talbert Nan for review and refills. Orders pended if approved.  Valisone 30g, 2RF Lidocaine ointment 30g, 0RF.

## 2019-08-25 NOTE — Telephone Encounter (Signed)
Spoke with pt. Pt given recommendations per Dr Talbert Nan  Pt states using miralax most days, had  really loose stools last week then cut back and had espisode of anal bleeding and pain for a few days now. Pt states this afternoon since using home Rx meds. Thankful for new Rx and knows to call office if doesn't improve.   Will route to Dr Talbert Nan for review and will close encounter.

## 2019-08-25 NOTE — Telephone Encounter (Signed)
She can use the lidocaine ointment. She can use the valisone short term particularly if her skin is irritated like it was previously outside of her anus. She needs to get her stools very soft. She has previously used miralax. She should either be using miralax or metamucil.  If she isn't getting better soon, she needs to be seen.

## 2019-09-04 ENCOUNTER — Ambulatory Visit: Payer: Medicare Other | Attending: Internal Medicine

## 2019-09-04 DIAGNOSIS — Z23 Encounter for immunization: Secondary | ICD-10-CM | POA: Insufficient documentation

## 2019-09-04 NOTE — Progress Notes (Signed)
   Covid-19 Vaccination Clinic  Name:  [name]    MRN: [id number] DOB: 03-Oct-1945  09/04/2019  [name] was observed post Covid-19 immunization for 15 minutes without incidence. She was provided with Vaccine Information Sheet and instruction to access the V-Safe system.   [name] was instructed to call 911 with any severe reactions post vaccine: Marland Kitchen Difficulty breathing  . Swelling of your face and throat  . A fast heartbeat  . A bad rash all over your body  . Dizziness and weakness    Immunizations Administered    Name Date Dose VIS Date Route   Pfizer COVID-19 Vaccine 09/04/2019  4:40 PM 0.3 mL 07/25/2019 Intramuscular   Manufacturer: [employer]   Lot: [id number]   [place]: [id number]

## 2019-09-25 ENCOUNTER — Ambulatory Visit: Payer: Medicare Other | Attending: Internal Medicine

## 2019-09-25 DIAGNOSIS — Z23 Encounter for immunization: Secondary | ICD-10-CM | POA: Insufficient documentation

## 2019-09-25 NOTE — Progress Notes (Signed)
   Covid-19 Vaccination Clinic  Name:  [name]    MRN: [id number] DOB: [birth year]  09/25/2019  [name] was observed post Covid-19 immunization for 15 minutes without incidence. She was provided with Vaccine Information Sheet and instruction to access the V-Safe system.   [name] was instructed to call 911 with any severe reactions post vaccine: Marland Kitchen Difficulty breathing  . Swelling of your face and throat  . A fast heartbeat  . A bad rash all over your body  . Dizziness and weakness    Immunizations Administered    Name Date Dose VIS Date Route   Pfizer COVID-19 Vaccine 09/25/2019  3:50 PM 0.3 mL 07/25/2019 Intramuscular   Manufacturer: [place]   Lot: [id number]   [place]: [id number]

## 2019-10-09 ENCOUNTER — Ambulatory Visit: Payer: Medicare Other | Admitting: Orthopedic Surgery

## 2019-10-09 ENCOUNTER — Ambulatory Visit: Payer: Self-pay

## 2019-10-09 ENCOUNTER — Other Ambulatory Visit: Payer: Self-pay

## 2019-10-09 DIAGNOSIS — M542 Cervicalgia: Secondary | ICD-10-CM

## 2019-10-10 ENCOUNTER — Encounter: Payer: Self-pay | Admitting: Orthopedic Surgery

## 2019-10-10 NOTE — Progress Notes (Signed)
Office Visit Note   Patient: [name]           Date of Birth: Aug 18, 1945           MRN: [id number] Visit Date: 10/09/2019 Requested by: Jilda Panda, MD [id number],  [place] [address] PCP: Jilda Panda, MD  Subjective: Chief Complaint  Patient presents with  . Neck - Pain    HPI: [name] is a 74 year old patient with neck pain and head pain.'s been ongoing since December 2020 and has gotten worse.  She reports some numbness and tingling going from the shoulder to the elbow on the left-hand side.  Tylenol is not giving her any relief.  It goes down to the right shoulder as well.  She has tried a home exercise program for many weeks as well as conservative measures and activity modification.  She is having little bit of pain at the base of the occiput as well.  She also reports a 50 pound weight loss.  Denies any nausea or fevers and chills.  Denies any discrete history of injury.  Headache has become relatively severe and it is focal in the neck.              ROS: All systems reviewed are negative as they relate to the chief complaint within the history of present illness.  Patient denies  fevers or chills.   Assessment & Plan: Visit Diagnoses:  1. Neck pain   2. Cervicalgia     Plan: Impression is 2 months of headache with some degenerative changes noted on plain radiographs and some degree of possible radiculopathy in the cervical spine and proximal musculature.  No definite weakness however.  I would favor MRI scanning of the neck and if that is relatively negative then she will need to be referred to neurology for work-up of this headache.  Could be occipital headache but has been going on for 2 months and we should rule out the neck as the source.  Follow-Up Instructions: Return for after MRI.   Orders:  Orders Placed This Encounter  Procedures  . XR Cervical Spine 2 or 3 views  . MR Cervical Spine w/o contrast   No orders of the defined types  were placed in this encounter.     Procedures: No procedures performed   Clinical Data: No additional findings.  Objective: Vital Signs: LMP 08/14/1985 (Approximate)   Physical Exam:   Constitutional: Patient appears well-developed HEENT:  Head: Normocephalic Eyes:EOM are normal Neck: Normal range of motion Cardiovascular: Normal rate Pulmonary/chest: Effort normal Neurologic: Patient is alert Skin: Skin is warm Psychiatric: Patient has normal mood and affect    Ortho Exam: Ortho exam demonstrates 5 out of 5 grip EPL FPL interosseous wrist flexion extension bicep triceps and deltoid strength with symmetric reflexes bilateral biceps and triceps and palpable radial pulses.  Neck range of motion mildly restricted with rotation to about 40 degrees bilaterally extension is about 20 degrees flexion is about 30 degrees.  No proximal muscle weakness.  Some kyphosis noted in the thoracic spine.  Specialty Comments:  No specialty comments available.  Imaging: No results found.   PMFS History: Patient Active Problem List   Diagnosis Date Noted  . History of kidney disease 06/11/2019  . Prediabetes   . Status post foot surgery 06/27/2013  . Other hammer toe (acquired) 06/10/2013  . Pain in foot 06/10/2013  . Onychomycosis 06/10/2013   Past Medical History:  Diagnosis Date  . COPD (  chronic obstructive pulmonary disease) ([facility])   . Glaucoma   . History of kidney disease   . History of kidney disease 06/11/2019  . History of recurrent UTIs   . Hyperlipidemia   . Hypertension   . Lichen sclerosus January 2017   vulva/perianal region  . Non-melanoma skin cancer 08/2015   right leg  . Osteoporosis   . Post-menopausal    . Prediabetes     Family History  Problem Relation Age of Onset  . Breast cancer Mother        deceased -6's  . Heart attack Father   . Heart attack Brother     Past Surgical History:  Procedure Laterality Date  . FOOT SURGERY Right 06/2013    right 2nd toe  . HEMATOMA EVACUATION Left 2007   left calf - excision of hemangioma with thrombus   Social History   Occupational History  . Not on file  Tobacco Use  . Smoking status: Never Smoker  . Smokeless tobacco: Never Used  Substance and Sexual Activity  . Alcohol use: Not Currently  . Drug use: No  . Sexual activity: Not Currently    Birth control/protection: Post-menopausal

## 2019-10-27 ENCOUNTER — Other Ambulatory Visit: Payer: Self-pay

## 2019-10-27 ENCOUNTER — Ambulatory Visit
Admission: RE | Admit: 2019-10-27 | Discharge: 2019-10-27 | Disposition: A | Discharge status: Home / Self Care | Payer: Medicare Other | Source: Ambulatory Visit | Attending: Orthopedic Surgery | Admitting: Orthopedic Surgery

## 2019-10-27 DIAGNOSIS — M542 Cervicalgia: Secondary | ICD-10-CM

## 2019-10-27 IMAGING — MR MR CERVICAL SPINE W/O CM
4 of 5 series · 27 of 48 positions shown · non-contrast
Comparison: Radiography [DATE]

CLINICAL DATA: Neck pain since [REDACTED] last year. No radiation
reported.

EXAM:
MRI CERVICAL SPINE WITHOUT CONTRAST
TECHNIQUE: Multiplanar, multisequence MR imaging of the cervical spine was
performed. No intravenous contrast was administered.

[Series 3: T1 · sagittal · 3.0mm · 0.41mm/px · 7 of 15 slices shown]
[im 1/15]
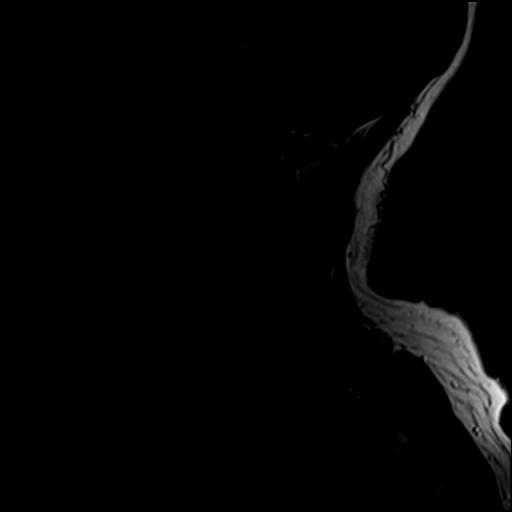
[im 3/15]
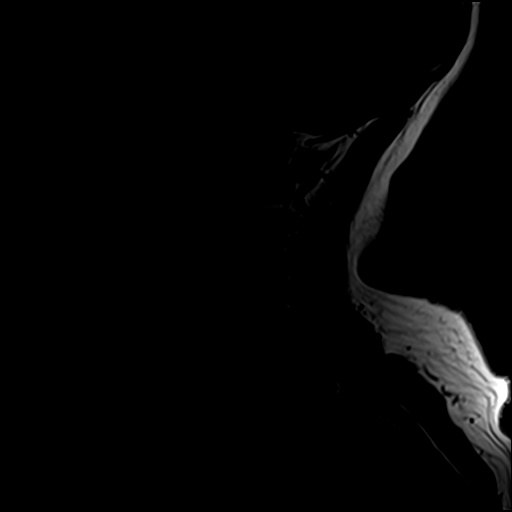
[im 5/15]
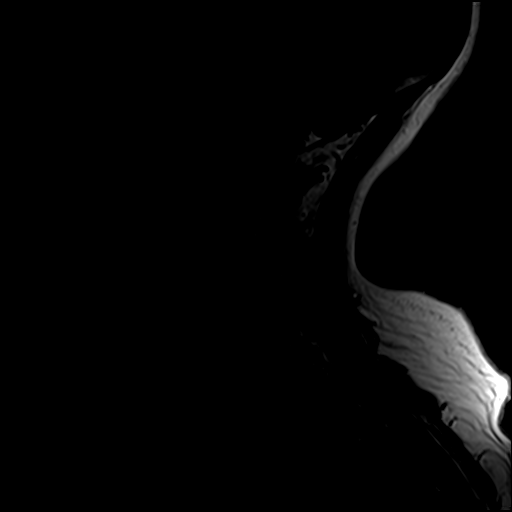
[im 7/15]
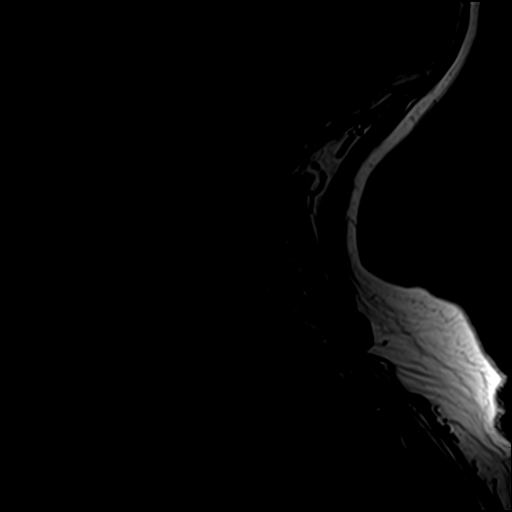
[im 9/15]
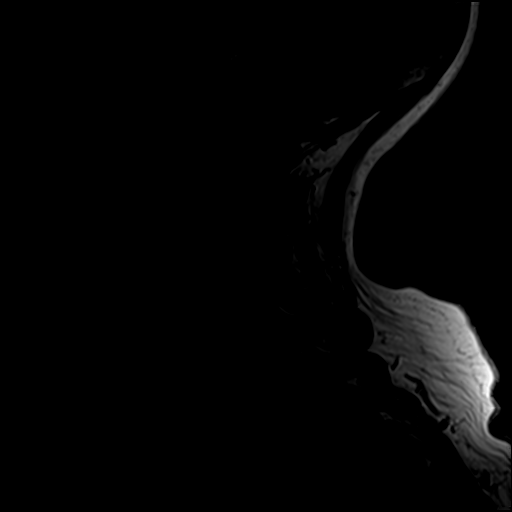
[im 11/15]
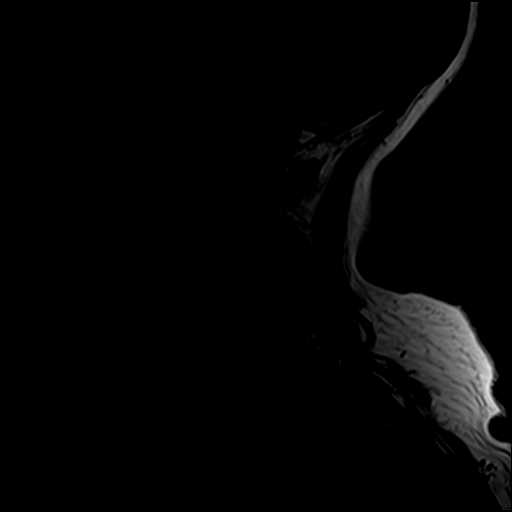
[im 13/15]
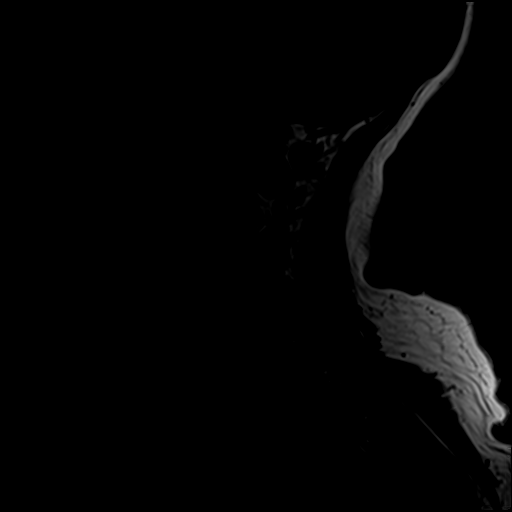

[Series 4: STIR · sagittal · 3.0mm · 0.82mm/px · 3 of 15 slices shown]
[im 3/15]
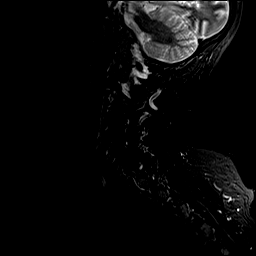
[im 9/15]
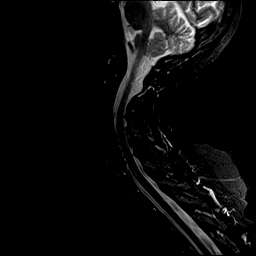
[im 13/15]
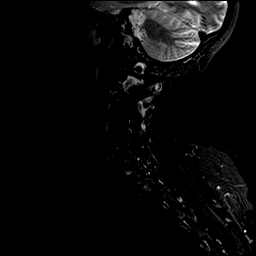

[Series 6: T2 · axial · 3.0mm · 0.70mm/px · z∈[-80,+6]mm · 9 of 24 slices shown (1 of 2)]
[im 1/24]
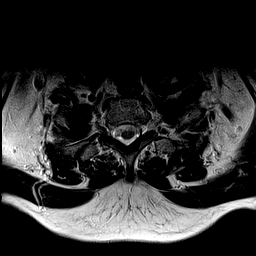
[im 5/24]
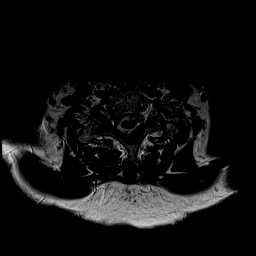
[im 7/24]
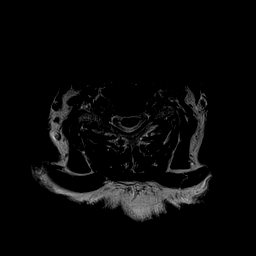
[im 11/24]
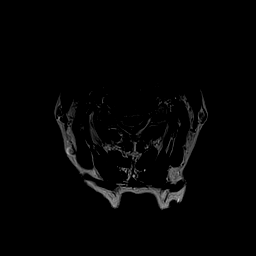
[im 13/24]
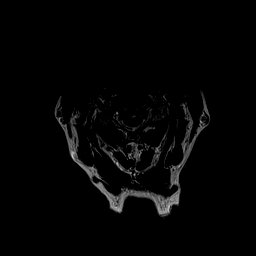
[im 17/24]
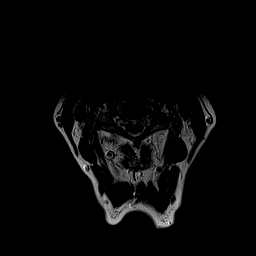
[im 19/24]
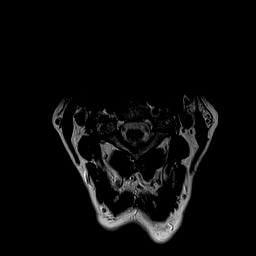
[im 21/24]
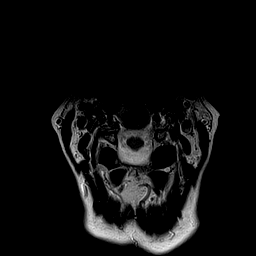
[im 24/24]
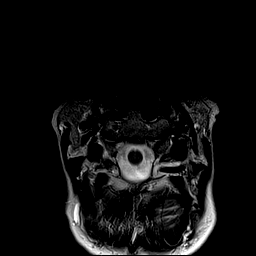

[Series 7: T2 · sagittal · 3.0mm · 0.41mm/px · 8 of 15 slices shown (2 of 2)]
[im 1/15]
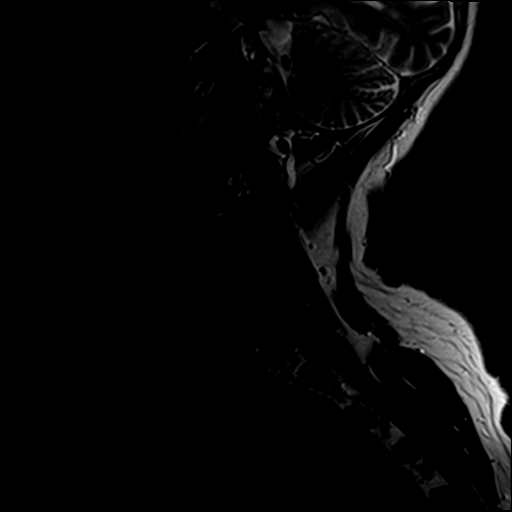
[im 3/15]
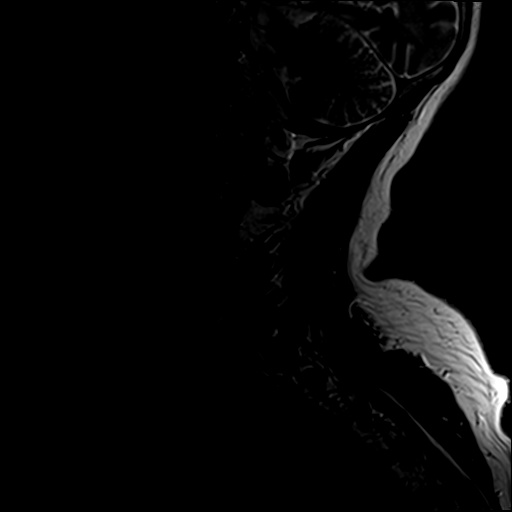
[im 5/15]
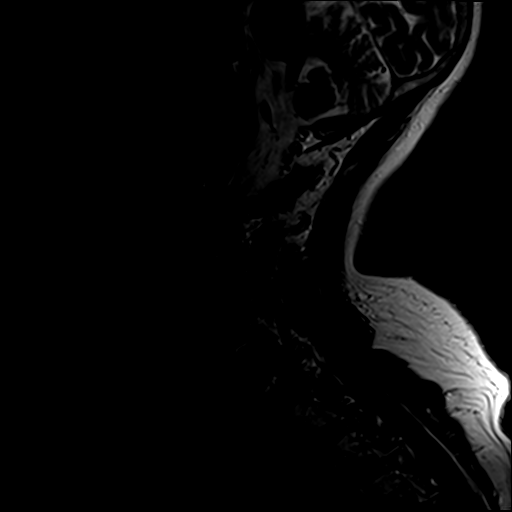
[im 7/15]
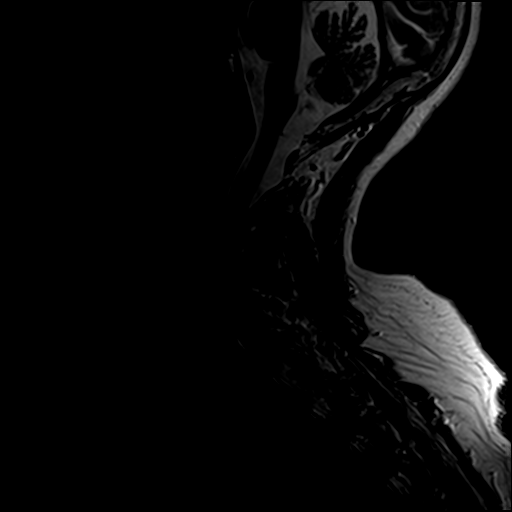
[im 9/15]
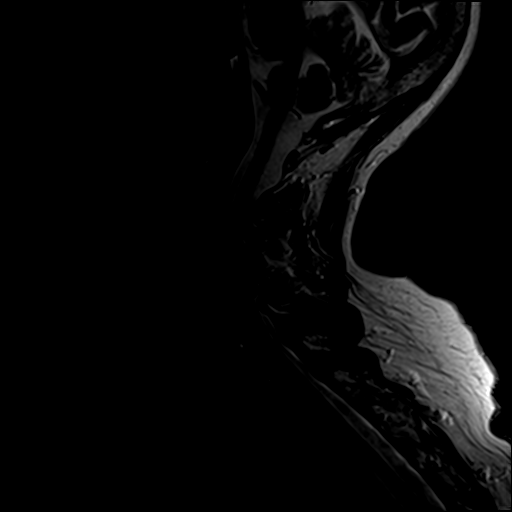
[im 11/15]
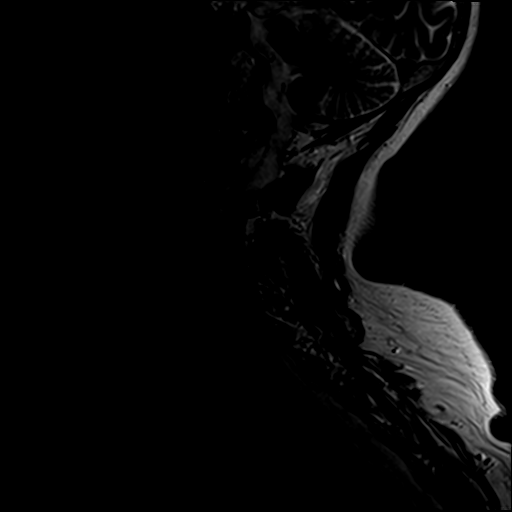
[im 13/15]
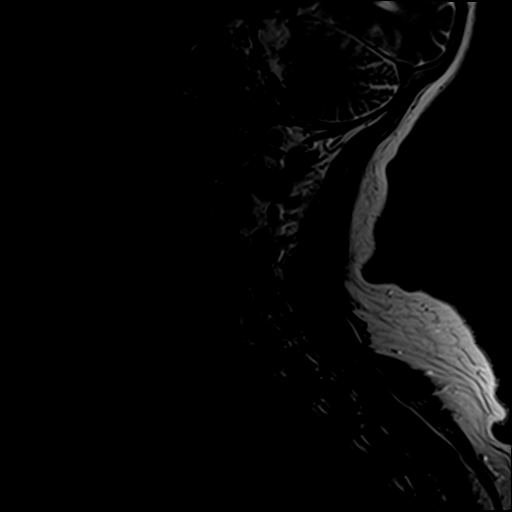
[im 15/15]
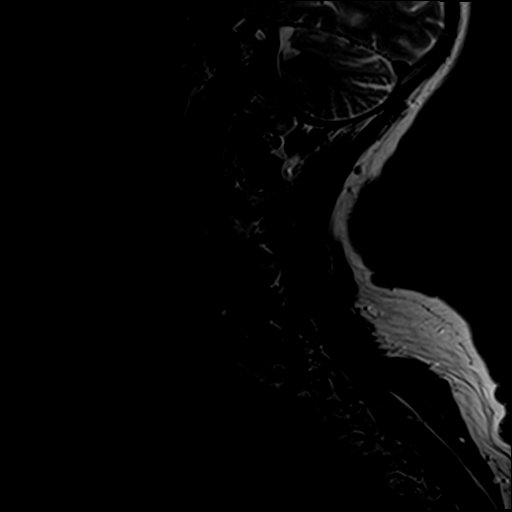

[27 of 48 positions shown; findings below may reference images not displayed]

FINDINGS: Alignment: Normal

Vertebrae: Normal

Cord: Normal

Posterior Fossa, vertebral arteries, paraspinal tissues: Normal

Disc levels:

Foramen magnum is widely patent.  No abnormality at C1-2.

C2-3: Mild bulging of the disc. Facet osteoarthritis on the right
without gross edema. No canal stenosis. Foramina sufficiently
patent.

C3-4: Mild bulging the disc. Bilateral facet osteoarthritis without
edema. No compressive canal or foraminal narrowing.

C4-5: Bulging of the disc. Mild narrowing of the ventral
subarachnoid space no compression of cord. Mild facet
osteoarthritis. Mild bilateral foraminal narrowing, not grossly
compressive.

C5-6: Bulging of the disc. Mild facet prominence. Mild narrowing of
the subarachnoid space but compressive canal narrowing. Mild
bilateral foraminal narrowing, not grossly compressive.

C6-7: Bulging of the disc. No facet degeneration. No canal or
foraminal stenosis.

C7-T1: Minimal bulging of the disc. Mild facet osteoarthritis. Now
no canal or foraminal stenosis.
IMPRESSION: Ordinary age related degenerative spondylosis and facet
osteoarthritis. No advanced disease. No apparent compressive
stenosis of the canal or foramina. Mild foraminal narrowing at C4-5
and C5-6. None of the facet arthropathy shows gross edematous change
or effusions.

## 2019-10-29 ENCOUNTER — Ambulatory Visit: Payer: Medicare Other | Admitting: Orthopedic Surgery

## 2019-10-29 ENCOUNTER — Encounter: Payer: Self-pay | Admitting: Orthopedic Surgery

## 2019-10-29 ENCOUNTER — Other Ambulatory Visit: Payer: Self-pay

## 2019-10-29 DIAGNOSIS — R519 Headache, unspecified: Secondary | ICD-10-CM

## 2019-10-29 DIAGNOSIS — M542 Cervicalgia: Secondary | ICD-10-CM

## 2019-10-30 ENCOUNTER — Telehealth: Payer: Self-pay | Admitting: Neurology

## 2019-10-30 ENCOUNTER — Encounter: Payer: Self-pay | Admitting: Neurology

## 2019-10-30 ENCOUNTER — Ambulatory Visit: Payer: Medicare Other | Admitting: Neurology

## 2019-10-30 VITALS — BP 126/74 | HR 67 | Temp 97.0°F | Ht 66.0 in | Wt 177.0 lb

## 2019-10-30 DIAGNOSIS — R351 Nocturia: Secondary | ICD-10-CM

## 2019-10-30 DIAGNOSIS — G4489 Other headache syndrome: Secondary | ICD-10-CM

## 2019-10-30 DIAGNOSIS — E663 Overweight: Secondary | ICD-10-CM | POA: Diagnosis not present

## 2019-10-30 DIAGNOSIS — Z8669 Personal history of other diseases of the nervous system and sense organs: Secondary | ICD-10-CM | POA: Diagnosis not present

## 2019-10-30 DIAGNOSIS — R519 Headache, unspecified: Secondary | ICD-10-CM

## 2019-10-30 DIAGNOSIS — R0683 Snoring: Secondary | ICD-10-CM

## 2019-10-30 DIAGNOSIS — Z72820 Sleep deprivation: Secondary | ICD-10-CM | POA: Diagnosis not present

## 2019-10-30 NOTE — Telephone Encounter (Signed)
[employer] medicare order sent to GI. No auth they will reach out to the patient to schedule.  

## 2019-10-30 NOTE — Progress Notes (Signed)
Subjective:    Patient ID: [name] is a 74 y.o. female.  HPI     Star Age, MD, PhD [facility] [address], [address] [place], [place] [address]  Dear Dr. Marlou Sa,  I saw your patient, [name], upon your kind request in my neurologic clinic today for initial consultation of her recurrent headaches.  The patient is unaccompanied today.  As you know, [name] is a 74 year old right-handed woman with an underlying medical history of osteoporosis, hypertension, hyperlipidemia, kidney disease, glaucoma, COPD, prediabetes, and overweight state, who reports Recurrent headaches in the back of her head for the past 4 months, before Thanksgiving 2020.  The headache is constant, achy, sometimes throbbing, sometimes the whole back of the head feels sore.  She denies any shooting pains, any radiating pains from her neck upwards, no tenderness in the temples.  She had a right eye stent placed for glaucoma in December 2020.  She had her cataract removed on the right side, she has a left-sided cataract.  She has a remote history of migraines when she was younger and had to even get shots she recalls, her mother will have to take her.  She has not been on Botox injections and does not recall what shots she got.  This was a long time ago, probably when she was in her 74s.  She has not had recurrent migraine-like headaches.  She does have some nausea and vomiting with this headache, she has vomited maybe 3 times, the vomiting does not alleviate the headache.  She takes Tylenol as needed which does not seem to help, sparingly she has also tried generic Excedrin.  She does not drink caffeine on a daily basis, decaf coffee in the morning. She hydrates well with water.  She has had some recent stressors, lost her A 5 years only recently in December 2020 and admits that she misses him dearly.  She has an older cat age 38 in the household, she is widowed and lives  alone, she had 4 miscarriages and has 1 son.  She does not sleep very well.  She has significantly difficulty falling asleep and has severe sleep disruption from nocturia.  She has a bedside commode as she does not have good bladder control.  She uses a bedside commode about 5 or 6 times per average night.  She tries to be in bed between 9 and 10 but may not be asleep until 3 or 4 and typically gets out of bed around 6 AM.  She denies any sudden onset of one-sided weakness or numbness or tingling or droopy face or slurring of speech.  She has right knee pain.  Her son got her a new neck pillow to use at night but it was too high and made it worse.  She recently got a new pillow which is a little flatter but it has not helped significantly.  She snores mildly as she believes, she has been a widow for a long time.  She is a non-smoker and does not drink alcohol.  She has COPD which is managed by her primary care physician, she has been around smokers all her life. 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 No advanced disease. No apparent compressive stenosis of the canal or foramina. Mild foraminal narrowing at C4-5 and C5-6. None of the facet arthropathy shows gross edematous change  or effusions.  Her Past Medical History Is Significant For: Past Medical History:  Diagnosis Date  . COPD (chronic obstructive pulmonary disease) ([facility])   . Glaucoma   . History of kidney disease   . History of kidney disease 06/11/2019  . History of recurrent UTIs   . Hyperlipidemia   . Hypertension   . Lichen sclerosus January 2017   vulva/perianal region  . Non-melanoma skin cancer 08/2015   right leg  . Osteoporosis   . Post-menopausal    . Prediabetes     Her Past Surgical History Is Significant For: Past Surgical History:  Procedure Laterality Date  . FOOT SURGERY Right 06/2013   right 2nd  toe  . HEMATOMA EVACUATION Left 2007   left calf - excision of hemangioma with thrombus    Her Family History Is Significant For: Family History  Problem Relation Age of Onset  . Breast cancer Mother        deceased -9's  . Heart attack Father   . Heart attack Brother     Her Social History Is Significant For: Social History   Socioeconomic History  . Marital status: Widowed    Spouse name: Not on file  . Number of children: Not on file  . Years of education: Not on file  . Highest education level: Not on file  Occupational History  . Not on file  Tobacco Use  . Smoking status: Never Smoker  . Smokeless tobacco: Never Used  Substance and Sexual Activity  . Alcohol use: Not Currently  . Drug use: No  . Sexual activity: Not Currently    Birth control/protection: Post-menopausal  Other Topics Concern  . Not on file  Social History Narrative  . Not on file   Social Determinants of Health   Financial Resource Strain:   . Difficulty of Paying Living Expenses:   Food Insecurity:   . Worried About Charity fundraiser in the Last Year:   . Arboriculturist in the Last Year:   Transportation Needs:   . Film/video editor (Medical):   Marland Kitchen Lack of Transportation (Non-Medical):   Physical Activity:   . Days of Exercise per Week:   . Minutes of Exercise per Session:   Stress:   . Feeling of Stress :   Social Connections:   . Frequency of Communication with Friends and Family:   . Frequency of Social Gatherings with Friends and Family:   . Attends Religious Services:   . Active Member of Clubs or Organizations:   . Attends Archivist Meetings:   Marland Kitchen Marital Status:     Her Allergies Are:  Allergies  Allergen Reactions  . Contrast Media [Iodinated Diagnostic Agents] Hives  . Ether Other (See Comments)    hallucinations  . Iohexol      Code: HIVES, Desc: pt had iv contrast for the first time today. doing a test dose w/ an miroi, 20cc contrast, pt  immediately broke out in hives, itching, swelling of lips and tongue.  Check w/ rad concerning recommendations.50 mg of benedryl given po per dr Tery Sanfilippo., Onset Date: [id number]   :   Her Current Medications Are:  Outpatient Encounter Medications as of 10/30/2019  Medication Sig  . albuterol (PROAIR HFA) 108 (90 BASE) MCG/ACT inhaler Inhale 2 puffs into the lungs every 6 (six) hours as needed for wheezing or shortness of breath.  Marland Kitchen aspirin 81 MG tablet Take 81 mg by mouth daily.  Marland Kitchen atorvastatin (LIPITOR)  20 MG tablet Take 1 tablet by mouth at bedtime.  . betamethasone valerate ointment (VALISONE) 0.1 % Apply a pea sized amount 3 x a week. Can increase to BID for up to 1-2 weeks as needed  . budesonide-formoterol (SYMBICORT) 160-4.5 MCG/ACT inhaler Inhale 2 puffs into the lungs every morning.  . calcium carbonate (OS-CAL) 600 MG TABS tablet Take 600 mg by mouth 2 (two) times daily with a meal.  . Cholecalciferol (VITAMIN D-3) 1000 UNITS CAPS Take by mouth daily.  . Coenzyme Q10 (COQ-10 PO) Take by mouth.  . diclofenac sodium (VOLTAREN) 1 % GEL Apply topically 4 (four) times daily.  . famotidine (PEPCID) 20 MG tablet Take 1 tablet (20 mg total) by mouth 2 (two) times daily.  Marland Kitchen latanoprost (XALATAN) 0.005 % ophthalmic solution Place 1 drop into both eyes at bedtime.  . lidocaine (XYLOCAINE) 5 % ointment Apply 1 application topically 4 (four) times daily as needed.  Marland Kitchen losartan (COZAAR) 100 MG tablet   . Misc Natural Products (FOCUSED MIND PO) Take by mouth daily.  . Multiple Vitamins-Minerals (CENTRUM SILVER ADULT 50+ PO) Take by mouth daily.  . polyethylene glycol (MIRALAX / GLYCOLAX) 17 g packet Take 17 g by mouth daily. Every other day  . vitamin C (ASCORBIC ACID) 250 MG tablet Take 250 mg by mouth daily.  . Cetirizine HCl 10 MG CAPS Take 1 capsule (10 mg total) by mouth daily for 10 days.   No facility-administered encounter medications on file as of 10/30/2019.  :  Review of Systems:  Out  of a complete 14 point review of systems, all are reviewed and negative with the exception of these symptoms as listed below: Review of Systems  Neurological:       Pt presents today to discuss her headache. Pt has had a constant headache since November. Pt has light and sound sensitivity but denies N/V. Pt has tried NSAIDs for pain relief but it did not help. Pt has not had a sleep study but does endorse snoring.  Epworth Sleepiness Scale 0= would never doze 1= slight chance of dozing 2= moderate chance of dozing 3= high chance of dozing  Sitting and reading: 1 Watching TV: 1 Sitting inactive in a public place (ex. Theater or meeting): 0 As a passenger in a car for an hour without a break: 0 Lying down to rest in the afternoon: 0 Sitting and talking to someone: 0 Sitting quietly after lunch (no alcohol): 1 In a car, while stopped in traffic: 0 Total: 3     Objective:  Neurological Exam  Physical Exam Physical Examination:   Vitals:   10/30/19 0751  BP: 126/74  Pulse: 67  Temp: (!) 97 F (36.1 C)   General Examination: The patient is a very pleasant 74 y.o. female in no acute distress. She appears well-developed and well-nourished and well groomed.   HEENT: Normocephalic, atraumatic, pupils are equal, round and reactive to light and accommodation. Funduscopic exam is normal on the right which is status post cataract extraction, more difficult to see on the left with cataract noted on the left.  Face is symmetric, normal facial animation and normal facial sensation, no temporal tenderness.  She has grossly intact hearing, extraocular tracking is well preserved, airway examination reveals small airway entry, redundant soft palate, larger uvula, tonsils small, Mallampati class II, neck circumference of 13-1/8 inches.  She has full dentures.  No lip, neck or jaw tremor.  Chest: Clear to auscultation without wheezing, rhonchi or crackles  noted.  Heart: S1+S2+0, regular and normal  without murmurs, rubs or gallops noted.   Abdomen: Soft, non-tender and non-distended with normal bowel sounds appreciated on auscultation.  Extremities: There is 1+ pitting edema in the distal lower extremities bilaterally. Pedal pulses are intact.  Skin: Warm and dry without trophic changes noted.  Musculoskeletal: exam reveals R knee pain.   Neurologically:  Mental status: The patient is awake, alert and oriented in all 4 spheres. Her immediate and remote memory, attention, language skills and fund of knowledge are appropriate. There is no evidence of aphasia, agnosia, apraxia or anomia. Speech is clear with normal prosody and enunciation. Thought process is linear. Mood is normal and affect is normal.  Cranial nerves II - XII are as described above under HEENT exam. In addition: shoulder shrug is normal with equal shoulder height noted. Motor exam: Normal bulk, strength and tone is noted. There is no drift, tremor or rebound. Reflexes are 1+ in the upper extremities, trace in the knees and ankles. Babinski: Toes are flexor bilaterally. Fine motor skills and coordination: intact with normal finger taps, normal hand movements, normal rapid alternating patting, normal foot taps and normal foot agility.  Cerebellar testing: No dysmetria or intention tremor on finger to nose testing. Heel to shin is unremarkable bilaterally. There is no truncal or gait ataxia.  Sensory exam: intact to light touch, pinprick, vibration, temperature sense in the upper and lower extremities.  Gait, station and balance: She stands easily. No veering to one side is noted. No leaning to one side is noted. Posture is age-appropriate, but she has excess curvature in the upper back.  She walks with a limp on the right.  Preserved arm swing, no shuffling.   Assessment and Plan:  In summary, SAKYRA PARIS is a very pleasant 74 y.o.-year old female  with an underlying medical history of osteoporosis, hypertension,  hyperlipidemia, kidney disease, glaucoma, COPD, prediabetes, and overweight state, who presents for evaluation of her new onset headaches for the past 4 months approximately.  She reports a constant, nearly daily headache.  Of note, she does report that it is often worse first thing in the morning.  It is a posterior headache, different from her typical migraines in the distant past.  Description of her headache is not classic for migraines, also not very telltale for occipital neuralgia.  I believe that she has a combination of factors that may cause these headaches including significant chronic sleep deprivation, possible underlying sleep disordered breathing given her history of snoring and significant nocturia reported as well as worse headaches first thing in the morning and somewhat crowded airway noted.  She may have tension headache given the recent stress she had what with the loss of her beloved pet.  It is not impossible that she has some recurrence of her migraines also.  I talked to the patient and her friend at length today.  I would like to pursue additional testing from my end of things including a brain MRI with and without contrast if possible.  She does have a history of kidney impairment, we will check her labs today and I may have to change the order to a without contrast MRI brain.  She had a recent cervical spine MRI with age-appropriate findings.  Her neurological exam is nonfocal thankfully.  She is reassured in that regard.  If she does have underlying sleep disordered breathing, would like to suggest therapy with a CPAP or AutoPap machine.  We will proceed  with sleep study testing and an MRI and keep her posted by phone call and I will follow up afterwards.  For now, I did not suggest any new medications, she is advised to use Excedrin Migraine sparingly because of the caffeine content and she has been cautious with it.  She is reminded to stay well-hydrated and try to rest well at night.   We may consider a migraine preventative or headache preventative medication in the near future.  I answered all her questions today and she was in agreement.   Thank you very much for allowing me to participate in the care of this nice patient. If I can be of any further assistance to you please do not hesitate to call me at [phone].  Sincerely,   Star Age, MD, PhD

## 2019-10-30 NOTE — Patient Instructions (Signed)
You were headache is not classic for migraines, especially since you used to have migraines and they were different.  Your headache is also not classic for a nerve irritation in the back of the neck called occipital neuralgia.  Your headache may be due to a combination of factors including a possibility of underlying sleep apnea, chronic sleep deprivation and tension headaches secondary to stress. I would like to investigate your headache further with a brain MRI with and without contrast and a sleep study to rule out underlying obstructive sleep apnea.  If you have sleep apnea you will benefit from treatment of this with a machine called CPAP.  We will call you to schedule your tests and call you with the results.  In the interim, stay well-hydrated with water, and also try to rest well, try to get 7 hours of sleep, use Tylenol or Excedrin Migraine sparingly as needed.  We may consider medication for headache prevention after testing.

## 2019-10-30 NOTE — Telephone Encounter (Signed)
Pt called wanting to discuss with RN some questions she has about the sleep test that was mentioned to her today at her appt. Please advise.

## 2019-10-31 ENCOUNTER — Encounter: Payer: Self-pay | Admitting: Orthopedic Surgery

## 2019-10-31 LAB — COMPREHENSIVE METABOLIC PANEL
ALT: 12 IU/L (ref 0–32)
AST: 18 IU/L (ref 0–40)
Albumin/Globulin Ratio: 1.8 (ref 1.2–2.2)
Albumin: 4.4 g/dL (ref 3.7–4.7)
Alkaline Phosphatase: 84 IU/L (ref 39–117)
BUN/Creatinine Ratio: 22 (ref 12–28)
BUN: 18 mg/dL (ref 8–27)
Bilirubin Total: 0.5 mg/dL (ref 0.0–1.2)
CO2: 27 mmol/L (ref 20–29)
Calcium: 9.8 mg/dL (ref 8.7–10.3)
Chloride: 104 mmol/L (ref 96–106)
Creatinine, Ser: 0.81 mg/dL (ref 0.57–1.00)
GFR calc Af Amer: 82 mL/min/{1.73_m2} (ref >59)
GFR calc non Af Amer: 71 mL/min/{1.73_m2} (ref >59)
Globulin, Total: 2.4 g/dL (ref 1.5–4.5)
Glucose: 97 mg/dL (ref 65–99)
Potassium: 4.5 mmol/L (ref 3.5–5.2)
Sodium: 143 mmol/L (ref 134–144)
Total Protein: 6.8 g/dL (ref 6.0–8.5)

## 2019-10-31 NOTE — Progress Notes (Signed)
Office Visit Note   Patient: [name]           Date of Birth: Aug 29, 1945           MRN: [id number] Visit Date: 10/29/2019 Requested by: Jilda Panda, MD [id number] [place],  [place] [address] PCP: Jilda Panda, MD  Subjective: Chief Complaint  Patient presents with  . Follow-up    HPI: [name] is a 74 y.o. female who presents to the office complaining of headache with neck pain.  She returns to discuss MRI C-spine results.  She complains of continued occipital headache that began just prior to Thanksgiving.  There is no change in his headache.  Pain radiates from the occipital skull to the neck to the left trapezius with occasional pain into the left shoulder.  She has tried Aleve, ibuprofen, Tylenol without significant relief.  She denies any weakness of her arms or legs.  She has never seen a neurologist before..                ROS:  All systems reviewed are negative as they relate to the chief complaint within the history of present illness.  Patient denies fevers or chills.  Assessment & Plan: Visit Diagnoses:  1. Recurrent occipital headache   2. Neck pain     Plan: Patient is a 74 year old female who presents complaining of constant occipital headache.  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  Does not seem like her symptoms are coming from her neck.  Patient will be referred to neurology for further evaluation of her headache.  Patient agreed with this plan.  No focal neurologic findings today.  Follow-Up Instructions: No follow-ups on file.   Orders:  Orders Placed This Encounter  Procedures  . Ambulatory referral to Neurology   No orders of the defined types were placed in this encounter.     Procedures: No procedures performed   Clinical Data: No additional findings.  Objective: Vital Signs: LMP  08/14/1985 (Approximate)   Physical Exam:  Constitutional: Patient appears well-developed HEENT:  Head: Normocephalic Eyes:EOM are normal Neck: Normal range of motion Cardiovascular: Normal rate Pulmonary/chest: Effort normal Neurologic: Patient is alert Skin: Skin is warm Psychiatric: Patient has normal mood and affect  Ortho Exam:  Tender to palpation throughout the occiput bilaterally.  Mild tenderness palpation throughout the inferior axial cervical spine.  No significant tenderness palpation about the paraspinal musculature.  5/5 motor strength of the bilateral grip, finger abduction, pronation/supination, bicep flexion, tricep extension, deltoid Sensation intact through all dermatomes of the bilateral upper extremities.  Specialty Comments:  No specialty comments available.  Imaging: No results found.   PMFS History: Patient Active Problem List   Diagnosis Date Noted  . History of kidney disease 06/11/2019  . Prediabetes   . Status post foot surgery 06/27/2013  . Other hammer toe (acquired) 06/10/2013  . Pain in foot 06/10/2013  . Onychomycosis 06/10/2013   Past Medical History:  Diagnosis Date  . COPD (chronic obstructive pulmonary disease) ([place])   . Glaucoma   . History of kidney disease   . History of kidney disease 06/11/2019  . History of recurrent UTIs   . Hyperlipidemia   . Hypertension   . Lichen sclerosus January 2017   vulva/perianal region  . Non-melanoma skin cancer 08/2015   right leg  . Osteoporosis   . Post-menopausal    .  Prediabetes     Family History  Problem Relation Age of Onset  . Breast cancer Mother        deceased -34's  . Heart attack Father   . Heart attack Brother     Past Surgical History:  Procedure Laterality Date  . FOOT SURGERY Right 06/2013   right 2nd toe  . HEMATOMA EVACUATION Left 2007   left calf - excision of hemangioma with thrombus   Social History   Occupational History  . Not on file  Tobacco Use  .  Smoking status: Never Smoker  . Smokeless tobacco: Never Used  Substance and Sexual Activity  . Alcohol use: Not Currently  . Drug use: No  . Sexual activity: Not Currently    Birth control/protection: Post-menopausal

## 2019-11-03 NOTE — Telephone Encounter (Signed)
I called pt. She does not wish to proceed with the sleep study at this time. She will proceed with the MRI as recommended.

## 2019-11-05 NOTE — Telephone Encounter (Signed)
Cancelled orders for sleep study

## 2019-11-13 ENCOUNTER — Other Ambulatory Visit: Payer: Self-pay

## 2019-11-13 ENCOUNTER — Encounter: Payer: Self-pay | Admitting: Obstetrics and Gynecology

## 2019-11-13 ENCOUNTER — Ambulatory Visit: Payer: Medicare Other | Admitting: Obstetrics and Gynecology

## 2019-11-13 VITALS — BP 116/72 | HR 68 | Temp 97.3°F | Resp 10 | Wt 180.2 lb

## 2019-11-13 DIAGNOSIS — L900 Lichen sclerosus et atrophicus: Secondary | ICD-10-CM | POA: Diagnosis not present

## 2019-11-13 DIAGNOSIS — L309 Dermatitis, unspecified: Secondary | ICD-10-CM | POA: Diagnosis not present

## 2019-11-13 NOTE — Progress Notes (Signed)
GYNECOLOGY  VISIT   HPI: 74 y.o.   Widowed White or Caucasian Not Hispanic or Latino  female   [phone] with Patient's last menstrual period was 08/14/1985 (approximate).   here for 6 month f/u for lichen sclerosis. Using steroid ointment 2 x a week.  She has had issues in the past with severe recurrent vulvitis and perianal dermatitis. She is doing better currently. She hasn't been well. She has been having severe headaches since November. She has imaging scheduled tomorrow of her brain.   GYNECOLOGIC HISTORY: Patient's last menstrual period was 08/14/1985 (approximate). Contraception:Postmenopausal Menopausal hormone therapy: none        OB History    Gravida  6   Para  1   Term  1   Preterm  0   AB  5   Living  1     SAB  5   TAB  0   Ectopic  0   Multiple  0   Live Births  1              Patient Active Problem List   Diagnosis Date Noted  . History of kidney disease 06/11/2019  . Prediabetes   . Status post foot surgery 06/27/2013  . Other hammer toe (acquired) 06/10/2013  . Pain in foot 06/10/2013  . Onychomycosis 06/10/2013    Past Medical History:  Diagnosis Date  . COPD (chronic obstructive pulmonary disease) ([place])   . Glaucoma   . History of kidney disease   . History of kidney disease 06/11/2019  . History of recurrent UTIs   . Hyperlipidemia   . Hypertension   . Lichen sclerosus January 2017   vulva/perianal region  . Non-melanoma skin cancer 08/2015   right leg  . Osteoporosis   . Post-menopausal    . Prediabetes     Past Surgical History:  Procedure Laterality Date  . EYE SURGERY Right    Stent placement  . FOOT SURGERY Right 06/2013   right 2nd toe  . HEMATOMA EVACUATION Left 2007   left calf - excision of hemangioma with thrombus    Current Outpatient Medications  Medication Sig Dispense Refill  . albuterol (PROAIR HFA) 108 (90 BASE) MCG/ACT inhaler Inhale 2 puffs into the lungs every 6 (six) hours as needed for wheezing  or shortness of breath.    Marland Kitchen aspirin 81 MG tablet Take 81 mg by mouth daily.    Marland Kitchen atorvastatin (LIPITOR) 20 MG tablet Take 1 tablet by mouth at bedtime.  11  . betamethasone valerate ointment (VALISONE) 0.1 % Apply a pea sized amount 3 x a week. Can increase to BID for up to 1-2 weeks as needed 30 g 0  . budesonide-formoterol (SYMBICORT) 160-4.5 MCG/ACT inhaler Inhale 2 puffs into the lungs every morning.    . calcium carbonate (OS-CAL) 600 MG TABS tablet Take 600 mg by mouth 2 (two) times daily with a meal.    . Cetirizine HCl 10 MG CAPS Take 1 capsule (10 mg total) by mouth daily for 10 days. 10 capsule 0  . Cholecalciferol (VITAMIN D-3) 1000 UNITS CAPS Take by mouth daily.    . Coenzyme Q10 (COQ-10 PO) Take by mouth.    . famotidine (PEPCID) 20 MG tablet Take 1 tablet (20 mg total) by mouth 2 (two) times daily. 30 tablet 0  . latanoprost (XALATAN) 0.005 % ophthalmic solution Place 1 drop into the left eye at bedtime.     . lidocaine (XYLOCAINE) 5 % ointment Apply  1 application topically 4 (four) times daily as needed. 30 g 0  . losartan (COZAAR) 100 MG tablet     . Misc Natural Products (FOCUSED MIND PO) Take by mouth daily.    . Multiple Vitamins-Minerals (ZINC PO) Take by mouth.    . polyethylene glycol (MIRALAX / GLYCOLAX) 17 g packet Take 17 g by mouth daily. Every other day    . vitamin C (ASCORBIC ACID) 250 MG tablet Take 250 mg by mouth daily.    . diclofenac sodium (VOLTAREN) 1 % GEL Apply topically 4 (four) times daily.    Marland Kitchen levocetirizine (XYZAL) 5 MG tablet Take 5 mg by mouth at bedtime.     No current facility-administered medications for this visit.     ALLERGIES: Contrast media [iodinated diagnostic agents], Ether, and Iohexol  Family History  Problem Relation Age of Onset  . Breast cancer Mother        deceased -88's  . Heart attack Father   . Heart attack Brother     Social History   Socioeconomic History  . Marital status: Widowed    Spouse name: Not on file   . Number of children: Not on file  . Years of education: Not on file  . Highest education level: Not on file  Occupational History  . Not on file  Tobacco Use  . Smoking status: Never Smoker  . Smokeless tobacco: Never Used  Substance and Sexual Activity  . Alcohol use: Not Currently  . Drug use: No  . Sexual activity: Not Currently    Birth control/protection: Post-menopausal  Other Topics Concern  . Not on file  Social History Narrative  . Not on file   Social Determinants of Health   Financial Resource Strain:   . Difficulty of Paying Living Expenses:   Food Insecurity:   . Worried About Charity fundraiser in the Last Year:   . Arboriculturist in the Last Year:   Transportation Needs:   . Film/video editor (Medical):   Marland Kitchen Lack of Transportation (Non-Medical):   Physical Activity:   . Days of Exercise per Week:   . Minutes of Exercise per Session:   Stress:   . Feeling of Stress :   Social Connections:   . Frequency of Communication with Friends and Family:   . Frequency of Social Gatherings with Friends and Family:   . Attends Religious Services:   . Active Member of Clubs or Organizations:   . Attends Archivist Meetings:   Marland Kitchen Marital Status:   Intimate Partner Violence:   . Fear of Current or Ex-Partner:   . Emotionally Abused:   Marland Kitchen Physically Abused:   . Sexually Abused:     Review of Systems  All other systems reviewed and are negative.   PHYSICAL EXAMINATION:    BP 116/72 (BP Location: Left Arm, Patient Position: Sitting, Cuff Size: Normal)   Pulse 68   Temp (!) 97.3 F (36.3 C) (Temporal)   Resp 10   Wt 180 lb 3.2 oz (81.7 kg)   LMP 08/14/1985 (Approximate)   BMI 29.09 kg/m     General appearance: alert, cooperative and appears stated age   Pelvic: External genitalia:  no lesions, diffusing whiting of the vulva, some mild loss of architecture. No lesions, plaques or fissures.              Urethra:  normal appearing urethra with  no masses, tenderness or lesions  Bartholins and Skenes: normal                 Perianal: diffuse whitening, large fissure   Chaperone was present for exam.  ASSESSMENT Lichen sclerosis, stable Perianal dermatitis, slight flare    PLAN Continue steroid ointment to the vulva Discussed perianal dermatitis, information given F/U 6 months   An After Visit Summary was printed and given to the patient.  Over 15 minutes in total patient care.

## 2019-11-13 NOTE — Patient Instructions (Signed)
Perianal Dermatitis, Adult Perianal dermatitis is inflammation of the skin around the anus. This condition causes patches of red, irritated skin that may be itchy or painful. It can be passed from one person to another (contagious), depending on what caused it. What are the causes? This condition may be caused by:  Contact with an irritant, such as stool, urine, mucus, soap, or sweat.  Bacteria, especially certain types of strep (streptococcal) bacteria.  Hemorrhoids.  Fungus.  Skin conditions, such as psoriasis.  Medical conditions, such as diabetes and Crohn disease.  Certain foods.  An opening between the rectum and the skin around the anus (fistula). What increases the risk? This condition is more likely to develop in:  People who are unable to control when they go to the bathroom (have incontinence).  People who have trouble walking or moving around (mobility problems).  People with thin or fragile skin.  People taking oral antibiotic medicines or steroids. What are the signs or symptoms? Itchy, red patches of skin are the main symptom of this condition. The skin in this area may also be swollen and have:  Cracks or tears (fissures).  Small, raised bumps (papules).  Small, pus-filled blisters (pustules). Other symptoms include:  Pain when passing stools.  Blood in the stool.  Itching around the anus.  Tenderness around the anus.  Holding back stools to avoid pain (constipation). How is this diagnosed? This condition is diagnosed with a medical history and physical exam. The diagnosis is confirmed by testing a sample of the skin for bacteria or fungus. How is this treated? Treatment for this condition depends on the cause. Mild cases may go away without treatment when contact with the irritant is stopped. Treatment for more severe cases of perianal dermatitis may include medicines, such as:  A waterproof barrier cream.  Topical or oral corticosteroids to  ease skin inflammation.  Antihistamines to relieve itching.  Antibiotics to treat a bacterial infection.  Antifungal cream to treat a fungal infection. Severe fungal infections may also be treated with topical steroids and medicine to control itching. Follow these instructions at home:   Take over-the-counter and prescription medicines only as told by your health care provider.  Apply prescribed or suggested creams only as told by your health care provider.  If you were prescribed an antibiotic, take it as told by your health care provider. Do not stop taking the antibiotic even if your condition improves.  Do not scratch the affected area.  Wash your hands carefully with soap and warm water after each time that you use the bathroom. If soap and water are not available, use hand sanitizer. Make sure other people in your household wash their hands frequently as well.  Keep all follow-up visits as told by your health care provider. This is important. How is this prevented?  Avoid contact with any substances that cause perianal inflammation.  Keep the perianal area clean and dry. Contact a health care provider if:  Your symptoms do not improve with treatment.  Your symptoms get worse.  Your symptoms go away and then return.  You are not able to control your bowel movements or you are leaking stool (have fecal incontinence).  You have a fever. Get help right away if:  You frequently pass blood in your stools.  You lose weight and you do not know why.  You have fluid, blood, or pus coming from the rash site. Summary  Perianal dermatitis is inflammation of the skin around the anus. This   condition causes patches of red, irritated skin that may be itchy or painful.  Itchy, red patches of skin are the main symptom of this condition.  Treatment for this condition depends on the cause. Mild cases may go away without treatment when contact with the irritant is stopped. Treatment  for more severe cases of perianal dermatitis may include medicines.  Wash your hands carefully with soap and warm water after each time that you use the bathroom. If soap and water are not available, use hand sanitizer.  Keep the perianal area clean and dry to prevent this condition. This information is not intended to replace advice given to you by your health care provider. Make sure you discuss any questions you have with your health care provider. Document Revised: 07/13/2017 Document Reviewed: 10/12/2016 Elsevier Patient Education  2020 [employer]  

## 2019-11-14 ENCOUNTER — Ambulatory Visit
Admission: RE | Admit: 2019-11-14 | Discharge: 2019-11-14 | Disposition: A | Discharge status: Home / Self Care | Payer: Medicare Other | Source: Ambulatory Visit | Attending: Neurology | Admitting: Neurology

## 2019-11-14 DIAGNOSIS — R351 Nocturia: Secondary | ICD-10-CM

## 2019-11-14 DIAGNOSIS — E663 Overweight: Secondary | ICD-10-CM

## 2019-11-14 DIAGNOSIS — G4489 Other headache syndrome: Secondary | ICD-10-CM

## 2019-11-14 DIAGNOSIS — Z72820 Sleep deprivation: Secondary | ICD-10-CM

## 2019-11-14 DIAGNOSIS — R519 Headache, unspecified: Secondary | ICD-10-CM

## 2019-11-14 DIAGNOSIS — R0683 Snoring: Secondary | ICD-10-CM

## 2019-11-14 DIAGNOSIS — Z8669 Personal history of other diseases of the nervous system and sense organs: Secondary | ICD-10-CM

## 2019-11-14 MED ORDER — GADOBENATE DIMEGLUMINE 529 MG/ML IV SOLN
14.0000 mL | Freq: Once | INTRAVENOUS | Status: AC | PRN
  Administered 2019-11-14: 14 mL via INTRAVENOUS

## 2019-11-17 ENCOUNTER — Telehealth: Payer: Self-pay

## 2019-11-17 NOTE — Telephone Encounter (Signed)
I called pt and discussed her MRI results and recommendations with her. Pt reports that her head still hurts. Pt does not want to complete a sleep study at this time. Pt has further questions about headache management and therefore I recommended an appt with Dr. Rexene Alberts. An appt was made for 11/19/19 at 9:30am, check in at 9:00am. Pt verbalized understanding of new appt date and time and of MRI results. Pt had no questions at this time but was encouraged to call back if questions arise.

## 2019-11-17 NOTE — Progress Notes (Signed)
Brain MRI w/wo contrast showed no acute findings and age-appropriate chronic findings; please update pt.  [name]

## 2019-11-17 NOTE — Telephone Encounter (Signed)
-----   Message from Star Age, MD sent at 11/17/2019  7:21 AM EDT ----- Brain MRI w/wo contrast showed no acute findings and age-appropriate chronic findings; please update pt.  Michel Bickers

## 2019-11-19 ENCOUNTER — Encounter: Payer: Self-pay | Admitting: Neurology

## 2019-11-19 ENCOUNTER — Other Ambulatory Visit: Payer: Self-pay

## 2019-11-19 ENCOUNTER — Ambulatory Visit (INDEPENDENT_AMBULATORY_CARE_PROVIDER_SITE_OTHER): Payer: Medicare Other | Admitting: Neurology

## 2019-11-19 VITALS — BP 151/87 | HR 63 | Temp 98.0°F | Ht 66.0 in | Wt 181.5 lb

## 2019-11-19 DIAGNOSIS — R519 Headache, unspecified: Secondary | ICD-10-CM

## 2019-11-19 DIAGNOSIS — Z72820 Sleep deprivation: Secondary | ICD-10-CM

## 2019-11-19 DIAGNOSIS — R351 Nocturia: Secondary | ICD-10-CM

## 2019-11-19 NOTE — Progress Notes (Signed)
Subjective:    Patient ID: [name] is a 74 y.o. female.  HPI     Interim history:   [name] is a 74 year old right-handed woman with an underlying medical history of osteoporosis, hypertension, hyperlipidemia, kidney disease, glaucoma, COPD, prediabetes, and overweight state, who Presents for follow-up consultation of her recurrent headaches.  The patient is unaccompanied today.  I first met her on 10/30/2019, at which time she reported an approximately 81-monthhistory of recurrent headaches affecting the back of her head.  I suggested we proceed with further evaluation of her headache because with a sleep study in a brain MRI.  She had declined a sleep study.  She had a brain MRI with and without contrast on 11/14/19 and I reviewed the results: IMPRESSION: This MRI of the brain with and without contrast shows the following: 1.   Few scattered T2/FLAIR hyperintense foci in the hemispheres consistent with minimal chronic microvascular ischemic change, typical for age. 2.    There are no acute findings and there is a normal enhancement pattern.   We called her with the test results.   Today. 11/19/2019: She reports feeling about the same, she has had intermittent headaches, sometimes she wakes up with a headache, typically they are constant and achy, sometimes her whole head feels sore but headache location is typically in the back of her head.  She has had no new symptoms.  She does not sleep well.  She has a bedside commode that she uses overnight.  She is reluctant to consider a sleep study.  We went over her brain MRI results.  I explained the sleep study process and sleep apnea diagnosis and treatment options again in detail to her.  She takes Excedrin Migraine as needed.  She feels like she can get by with it.  She tries to hydrate well with water.  She is physically active.  The patient's allergies, current medications, family history, past medical history, past social history,  past surgical history and problem list were reviewed and updated as appropriate.   Previously:   10/30/19: (She) reports Recurrent headaches in the back of her head for the past 4 months, before Thanksgiving 2020.  The headache is constant, achy, sometimes throbbing, sometimes the whole back of the head feels sore.  She denies any shooting pains, any radiating pains from her neck upwards, no tenderness in the temples.  She had a right eye stent placed for glaucoma in December 2020.  She had her cataract removed on the right side, she has a left-sided cataract.  She has a remote history of migraines when she was younger and had to even get shots she recalls, her mother will have to take her.  She has not been on Botox injections and does not recall what shots she got.  This was a long time ago, probably when she was in her 89s  She has not had recurrent migraine-like headaches.  She does have some nausea and vomiting with this headache, she has vomited maybe 3 times, the vomiting does not alleviate the headache.  She takes Tylenol as needed which does not seem to help, sparingly she has also tried generic Excedrin.  She does not drink caffeine on a daily basis, decaf coffee in the morning. She hydrates well with water.  She has had some recent stressors, lost her pet of 5 years only recently in December 2020 and admits that she misses him dearly.  She has an older cat age 76  in the household, she is widowed and lives alone, she had 4 miscarriages and has 1 son.  She does not sleep very well.  She has significantly difficulty falling asleep and has severe sleep disruption from nocturia.  She has a bedside commode as she does not have good bladder control.  She uses a bedside commode about 5 or 6 times per average night.  She tries to be in bed between 9 and 10 but may not be asleep until 3 or 4 and typically gets out of bed around 6 AM.  She denies any sudden onset of one-sided weakness or numbness or tingling or  droopy face or slurring of speech.  She has right knee pain.  Her son got her a new neck pillow to use at night but it was too high and made it worse.  She recently got a new pillow which is a little flatter but it has not helped significantly.  She snores mildly as she believes, she has been a widow for a long time.  She is a non-smoker and does not drink alcohol.  She has COPD which is managed by her primary care physician, she has been around smokers all her life. 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 No advanced disease. No apparent compressive stenosis of the canal or foramina. Mild foraminal narrowing at C4-5 and C5-6. None of the facet arthropathy shows gross edematous change or effusions.  Her Past Medical History Is Significant For: Past Medical History:  Diagnosis Date  . COPD (chronic obstructive pulmonary disease) ([place])   . Glaucoma   . History of kidney disease   . History of kidney disease 06/11/2019  . History of recurrent UTIs   . Hyperlipidemia   . Hypertension   . Lichen sclerosus January 2017   vulva/perianal region  . Non-melanoma skin cancer 08/2015   right leg  . Osteoporosis   . Post-menopausal    . Prediabetes     Her Past Surgical History Is Significant For: Past Surgical History:  Procedure Laterality Date  . EYE SURGERY Right    Stent placement  . FOOT SURGERY Right 06/2013   right 2nd toe  . HEMATOMA EVACUATION Left 2007   left calf - excision of hemangioma with thrombus    Her Family History Is Significant For: Family History  Problem Relation Age of Onset  . Breast cancer Mother        deceased -78's  . Heart attack Father   . Heart attack Brother     Her Social History Is Significant For: Social History   Socioeconomic History  . Marital status: Widowed    Spouse name: Not on file  . Number of children: Not on file   . Years of education: Not on file  . Highest education level: Not on file  Occupational History  . Not on file  Tobacco Use  . Smoking status: Never Smoker  . Smokeless tobacco: Never Used  Substance and Sexual Activity  . Alcohol use: Not Currently  . Drug use: No  . Sexual activity: Not Currently    Birth control/protection: Post-menopausal  Other Topics Concern  . Not on file  Social History Narrative  . Not on file   Social Determinants of Health   Financial Resource Strain:   . Difficulty of Paying Living Expenses:   Food Insecurity:   . Worried About Estate manager/land agent  of Food in the Last Year:   . [place] in the Last Year:   Transportation Needs:   . Lack of Transportation (Medical):   Marland Kitchen Lack of Transportation (Non-Medical):   Physical Activity:   . Days of Exercise per Week:   . Minutes of Exercise per Session:   Stress:   . Feeling of Stress :   Social Connections:   . Frequency of Communication with Friends and Family:   . Frequency of Social Gatherings with Friends and Family:   . Attends Religious Services:   . Active Member of Clubs or Organizations:   . Attends Archivist Meetings:   Marland Kitchen Marital Status:     Her Allergies Are:  Allergies  Allergen Reactions  . Contrast Media [Iodinated Diagnostic Agents] Hives  . Ether Other (See Comments)    hallucinations  . Iohexol      Code: HIVES, Desc: pt had iv contrast for the first time today. doing a test dose w/ an miroi, 20cc contrast, pt immediately broke out in hives, itching, swelling of lips and tongue.  Check w/ rad concerning recommendations.50 mg of benedryl given po per dr Tery Sanfilippo., Onset Date: [id number]   :   Her Current Medications Are:  Outpatient Encounter Medications as of 11/19/2019  Medication Sig  . albuterol (PROAIR HFA) 108 (90 BASE) MCG/ACT inhaler Inhale 2 puffs into the lungs every 6 (six) hours as needed for wheezing or shortness of breath.  Marland Kitchen aspirin 81 MG tablet Take 81  mg by mouth daily.  Marland Kitchen aspirin-acetaminophen-caffeine (EXCEDRIN MIGRAINE) 250-250-65 MG tablet Take 1 tablet by mouth as needed for headache.  Marland Kitchen atorvastatin (LIPITOR) 20 MG tablet Take 1 tablet by mouth at bedtime.  . betamethasone valerate ointment (VALISONE) 0.1 % Apply a pea sized amount 3 x a week. Can increase to BID for up to 1-2 weeks as needed  . budesonide-formoterol (SYMBICORT) 160-4.5 MCG/ACT inhaler Inhale 2 puffs into the lungs every morning.  . calcium carbonate (OS-CAL) 600 MG TABS tablet Take 600 mg by mouth 2 (two) times daily with a meal.  . Cholecalciferol (VITAMIN D-3) 1000 UNITS CAPS Take by mouth daily.  . Coenzyme Q10 (COQ-10 PO) Take by mouth.  . diclofenac sodium (VOLTAREN) 1 % GEL Apply topically 4 (four) times daily.  . famotidine (PEPCID) 20 MG tablet Take 1 tablet (20 mg total) by mouth 2 (two) times daily.  Marland Kitchen latanoprost (XALATAN) 0.005 % ophthalmic solution Place 1 drop into the left eye at bedtime.   . lidocaine (XYLOCAINE) 5 % ointment Apply 1 application topically 4 (four) times daily as needed.  Marland Kitchen losartan (COZAAR) 100 MG tablet   . Misc Natural Products (FOCUSED MIND PO) Take by mouth daily.  . Multiple Vitamins-Minerals (ZINC PO) Take by mouth.  . polyethylene glycol (MIRALAX / GLYCOLAX) 17 g packet Take 17 g by mouth daily. Every other day  . vitamin C (ASCORBIC ACID) 250 MG tablet Take 250 mg by mouth daily.  . Cetirizine HCl 10 MG CAPS Take 1 capsule (10 mg total) by mouth daily for 10 days.  . [DISCONTINUED] levocetirizine (XYZAL) 5 MG tablet Take 5 mg by mouth at bedtime.   No facility-administered encounter medications on file as of 11/19/2019.  :  Review of Systems:  Out of a complete 14 point review of systems, all are reviewed and negative with the exception of these symptoms as listed below: Review of Systems  Neurological:       Room  2. Alone. She would like to review her MRI brain in further detail. She has continued to have daily headaches.  She has been taking generic Excedrin Migraine for her most severe pain. She declined sleep study because she is up and down all night. She did not feel the results would be helpful.//mck  Epworth Sleepiness Scale 0= would never doze 1= slight chance of dozing 2= moderate chance of dozing 3= high chance of dozing  Sitting and reading:2 Watching TV:1 Sitting inactive in a public place (ex. Theater or meeting):2 As a passenger in a car for an hour without a break:3 Lying down to rest in the afternoon:0 Sitting and talking to someone:1 Sitting quietly after lunch (no alcohol):1 In a car, while stopped in traffic:0 Total:10    Objective:  Neurological Exam  Physical Exam Physical Examination:   Vitals:   11/19/19 0922  BP: (!) 151/87  Pulse: 63  Temp: 98 F (36.7 C)    General Examination: The patient is a very pleasant 74 y.o. female in no acute distress. She appears well-developed and well-nourished and well groomed.   HEENT: Normocephalic, atraumatic, pupils are equal, round and reactive to light and accommodation. Glasses in place. Cataract noted on the left.  Face is symmetric, normal facial animation and normal facial sensation, no temporal tenderness. Grossly intact hearing, extraocular tracking is well preserved, airway examination reveals small airway entry, redundant soft palate, larger uvula, tonsils small, full dentures.  No lip, neck or jaw tremor.  Chest: Clear to auscultation without wheezing, rhonchi or crackles noted.  Heart: S1+S2+0, regular and normal without murmurs, rubs or gallops noted.   Abdomen: Soft, non-tender and non-distended with normal bowel sounds appreciated on auscultation.  Extremities: There is trace edema in the distal lower extremities bilaterally.   Skin: Warm and dry without trophic changes noted.  Musculoskeletal: exam reveals R knee pain.   Neurologically:  Mental status: The patient is awake, alert and oriented in all 4  spheres. Her immediate and remote memory, attention, language skills and fund of knowledge are appropriate. There is no evidence of aphasia, agnosia, apraxia or anomia. Speech is clear with normal prosody and enunciation. Thought process is linear. Mood is normal and affect is normal.  Cranial nerves II - XII are as described above under HEENT exam. In addition: shoulder shrug is normal with equal shoulder height noted. Motor exam: Normal bulk, strength and tone is noted. There is no drift, or tremor. Fine motor skills and coordination: grossly intact.  Cerebellar testing: No dysmetria or intention tremor. There is no truncal or gait ataxia.  Sensory exam: intact to light touch, pinprick, vibration, temperature sense in the upper and lower extremities.  Gait, station and balance: She stands easily. No veering to one side is noted. No leaning to one side is noted. Posture is age-appropriate, but she has excess curvature in the upper back.  She walks with a limp on the right.  Preserved arm swing, no shuffling. Stable gait.  Assessment and Plan:  In summary, [name] is a very pleasant 74 year old female with an underlying medical history of osteoporosis, hypertension, hyperlipidemia, kidney disease, glaucoma, COPD, prediabetes, and overweight state, who presents for follow-up consultation of her recurrent headaches.  She describes a nonmigrainous headache.  She has had morning headaches and sleep is disrupted, particularly from nocturia.  She is sleepy during the day.  She continues to decline sleep evaluation.  I talked to her about the sleep study process in detail  again today and explained to her that untreated sleep apnea may explain some of her symptoms.  I also explained the CPAP and AutoPap therapy to her.  I offered her a trial of amitriptyline for headache prevention, low-dose with gradual titration and we talked about expectations and potential side effects.  She feels like she gets by  with as needed use of Excedrin currently.  Her exam is stable, recent brain MRI did not show any obvious structural abnormality or explanation for these headaches.  She is encouraged to call our office should she change her mind regarding her sleep evaluation.  We mutually agreed to have her return to clinic for neurological follow-up on an as-needed basis.  I answered all her questions today and she was in agreement with the plan and indicated that she would call if she changes her mind regarding the sleep study.  I spent 25 minutes in total face-to-face time and in reviewing records during pre-charting, more than 50% of which was spent in counseling and coordination of care, reviewing test results, reviewing medications and treatment regimen and/or in discussing or reviewing the diagnosis of rec. HAs, the prognosis and treatment options. Pertinent laboratory and imaging test results that were available during this visit with the patient were reviewed by me and considered in my medical decision making (see chart for details).

## 2019-11-19 NOTE — Patient Instructions (Signed)
As discussed, I recommend we proceed with a sleep study to rule out obstructive sleep apnea as a possible cause of your recurrent headaches.  Untreated obstructive sleep apnea if it is moderate or severe can also cause long-term problems with blood pressure, heart pump function, irregular heartbeat, mini stroke, memory loss or dementia and stroke risk.  Some people have significant sleep disruption and daytime sleepiness.  You indicate feeling sleepy during the day and have sleep disruption at night, also from having to go to the bathroom at night.  These may all be symptoms of untreated sleep apnea.  Please think about the sleep study.  If you change your mind, please call us back.  You can continue to take Excedrin as needed cautiously.  You do not wish to try any prescription headache preventative at this time.  We can see you back as needed.  Please try to hydrate well with water and rest well at night, try to achieve 7 to 8 hours of sleep at night.  Please do not drive if you feel sleepy.

## 2019-11-20 ENCOUNTER — Telehealth: Payer: Self-pay | Admitting: Neurology

## 2019-11-20 MED ORDER — AMITRIPTYLINE HCL 25 MG PO TABS: ORAL_TABLET | ORAL | 3 refills | Status: DC

## 2019-11-20 NOTE — Telephone Encounter (Signed)
She had declined the prescription and therefore she was also not advised to FU.   If she would like to try amitriptyline, we can start her on a small dose with titration so long as she is mindful of the potential side effects including blurry vision, mouth dryness, sleepiness, feeling off balance, depression.  We will start on a small dose and have her follow-up with the nurse practitioner in about 2 to 3 months.  Elavil (generic name: amitriptyline) 25 mg: Take half a pill daily at bedtime for one week, then one pill daily at bedtime thereafter. Common side effects reported are: mouth dryness, drowsiness, confusion, dizziness.  If she is agreeable to this, I can send a prescription, please make FU appt too.

## 2019-11-20 NOTE — Addendum Note (Signed)
Addended by: Lester Sagad[place] A on: 11/20/2019 04:19 PM   Modules accepted: Orders

## 2019-11-20 NOTE — Telephone Encounter (Signed)
Patient called wanting to know if she can have some of the pills that was discussed during her OV yesterday pt states they are unsure of the name of the medication

## 2019-11-20 NOTE — Telephone Encounter (Signed)
I called pt and discussed this with her. She is agreeable to starting elavil and understands instructions on how to take it and potential s/e. An appt was made with Amy, NP on 01/21/20 at 1:00pm. Pt verbalized understanding of medication recommendations and of new appt date and time.

## 2019-11-26 ENCOUNTER — Other Ambulatory Visit: Payer: Self-pay | Admitting: Neurology

## 2019-11-26 MED ORDER — UBRELVY 50 MG PO TABS: 50.0000 mg | ORAL_TABLET | ORAL | 3 refills | Status: DC | PRN

## 2019-11-26 NOTE — Telephone Encounter (Signed)
Patient called stating the amitriptyline (ELAVIL) 25 MG tablet  Has been making her sleepy and she sleeps all day and was under the impression medication would help with headaches. Pt states she is also drowsy and has pain/cramps all over her body. Pt would like to know if it is okay to discontinue taking. Pt requested a CB

## 2019-11-26 NOTE — Addendum Note (Signed)
Addended by: Star Age on: 11/26/2019 04:33 PM   Modules accepted: Orders

## 2019-11-26 NOTE — Addendum Note (Signed)
Addended by: Darleen Crocker on: 11/26/2019 04:12 PM   Modules accepted: Orders

## 2019-11-26 NOTE — Telephone Encounter (Signed)
She can try as needed use of Ubrelvy, 50 mg strength: Take 1 pill at onset of migraine headache, may repeat in 2 hours, no more than 4 pills in 24 hours, i.e. not to exceed 200 mg per 24 hours. May cause sedation and nausea.   Rx sent to pharmacy. Please advise patient.

## 2019-11-26 NOTE — Telephone Encounter (Signed)
Called the patient back to verify she is taking the medication at bedtime. She states that it continues to feel drowsy after waking up in the morning. She states that she had never had these pain/cramps before and only is able to associate with the medication. Advised neither Dr Rexene Alberts nor I had heard of that being a typical SE. Informed her that Dr. Rexene Alberts is ok with her dc the medication.  As far as medication that she has tried and failed, the only other thing she states she has used is the excedrin migraine (generic) she can't really take that due to caffeine. I educated also on rebound headaches that can occur when taking them too often.  I asked her how her BP typically runs and the patient takes her BP every morning. She states it averages anywhere from 129/69-153/68. Advised the patient I would mention this information to Dr Rexene Alberts and see if she has other recommendations to offer to the patient. If so she would like to send to a different pharmacy which I have corrected on the chart. [facility] on [address]. She was appreciative for the call and will wait for me to call with Dr Rexene Alberts recommendations.

## 2019-11-26 NOTE — Telephone Encounter (Signed)
Called the patient back and advised her that Dr Rexene Alberts would recommend trying Roselyn Meier. Advised her of the side effects of the medication and that it is used as a emergency relief type medication. Informed her insurance doesn't allow more then 10 tablets a month.advised it would most likely require a PA and once that is completed she can pick it up. Pt verbalized understanding.

## 2019-11-27 ENCOUNTER — Other Ambulatory Visit: Payer: Medicare Other

## 2019-11-27 NOTE — Telephone Encounter (Signed)
Received PA request from [facility] for ubrelvy. Triptans are preferred. I spoke with Dr. Rexene Alberts. Triptans are contraindicated for pt due to her age, HTN, and sensitivity to other medications.   PA completed via covermymeds. Key: [id number]. Sent to [place]. Should have a determination in 1-3 business days.

## 2019-11-27 NOTE — Telephone Encounter (Signed)
Received this notice from covermymeds: "Request Reference Number: [id number]. UBRELVY TAB 50MG  is approved through 08/13/2020. Your patient may now fill this prescription and it will be covered."  [facility] informed.

## 2019-12-04 MED ORDER — GABAPENTIN 100 MG PO CAPS: 100.0000 mg | ORAL_CAPSULE | Freq: Every day | ORAL | 0 refills | Status: DC

## 2019-12-04 MED ORDER — GABAPENTIN 100 MG PO CAPS: 100.0000 mg | ORAL_CAPSULE | Freq: Every day | ORAL | 2 refills | Status: DC

## 2019-12-04 NOTE — Telephone Encounter (Signed)
She can try low dose Neurontin (gabapentin) 100 mg strength: Take 1 pill nightly. Please advise her about the following: The most common side effects reported are sedation or sleepiness, rare side effects include balance problems, confusion. Rx sent to pharm.

## 2019-12-04 NOTE — Telephone Encounter (Signed)
I reached out to the pt. She is agreeable to trying the gabapentin and verbalized understanding on side effects.  Pt did ask we send a [facility] day supply to [facility] to see if she can tolerate the medication before she picks up a 30 day supply.  I have sent a 7 day supply to [facility].  Pt was advised to call us back once she has started the medication to advise on how she is doing.

## 2019-12-04 NOTE — Telephone Encounter (Signed)
Received a fax from [facility] stating the co pay for the [name] would be 135 $.  Pt is medicare and is not eligible to use the co pay assistant card and PA has been approved as of 11/27/2019. Will fwd to Dr. Rexene Alberts for alternative drug choice.

## 2019-12-04 NOTE — Addendum Note (Signed)
Addended by: Star Age on: 12/04/2019 07:44 AM   Modules accepted: Orders

## 2019-12-04 NOTE — Addendum Note (Signed)
Addended by: Verlin Grills on: 12/04/2019 10:22 AM   Modules accepted: Orders

## 2019-12-11 ENCOUNTER — Encounter (HOSPITAL_COMMUNITY): Payer: Self-pay | Admitting: Emergency Medicine

## 2019-12-11 ENCOUNTER — Emergency Department (HOSPITAL_COMMUNITY): Payer: Medicare Other

## 2019-12-11 ENCOUNTER — Inpatient Hospital Stay (HOSPITAL_COMMUNITY)
Admission: EM | Admit: 2019-12-11 | Discharge: 2019-12-13 | DRG: 562 | Disposition: A | Discharge status: Home / Self Care | Payer: Medicare Other | Attending: Internal Medicine | Admitting: Internal Medicine

## 2019-12-11 ENCOUNTER — Other Ambulatory Visit: Payer: Self-pay

## 2019-12-11 DIAGNOSIS — R7303 Prediabetes: Secondary | ICD-10-CM | POA: Diagnosis present

## 2019-12-11 DIAGNOSIS — Z87440 Personal history of urinary (tract) infections: Secondary | ICD-10-CM

## 2019-12-11 DIAGNOSIS — R55 Syncope and collapse: Secondary | ICD-10-CM | POA: Diagnosis not present

## 2019-12-11 DIAGNOSIS — S53124A Posterior dislocation of right ulnohumeral joint, initial encounter: Principal | ICD-10-CM | POA: Diagnosis present

## 2019-12-11 DIAGNOSIS — Z7982 Long term (current) use of aspirin: Secondary | ICD-10-CM

## 2019-12-11 DIAGNOSIS — Z91041 Radiographic dye allergy status: Secondary | ICD-10-CM

## 2019-12-11 DIAGNOSIS — Z85828 Personal history of other malignant neoplasm of skin: Secondary | ICD-10-CM

## 2019-12-11 DIAGNOSIS — J449 Chronic obstructive pulmonary disease, unspecified: Secondary | ICD-10-CM | POA: Diagnosis present

## 2019-12-11 DIAGNOSIS — Z419 Encounter for procedure for purposes other than remedying health state, unspecified: Secondary | ICD-10-CM

## 2019-12-11 DIAGNOSIS — T148XXA Other injury of unspecified body region, initial encounter: Secondary | ICD-10-CM

## 2019-12-11 DIAGNOSIS — W1830XA Fall on same level, unspecified, initial encounter: Secondary | ICD-10-CM | POA: Diagnosis present

## 2019-12-11 DIAGNOSIS — Z87448 Personal history of other diseases of urinary system: Secondary | ICD-10-CM | POA: Diagnosis present

## 2019-12-11 DIAGNOSIS — Z8249 Family history of ischemic heart disease and other diseases of the circulatory system: Secondary | ICD-10-CM

## 2019-12-11 DIAGNOSIS — Z7951 Long term (current) use of inhaled steroids: Secondary | ICD-10-CM

## 2019-12-11 DIAGNOSIS — X501XXA Overexertion from prolonged static or awkward postures, initial encounter: Secondary | ICD-10-CM

## 2019-12-11 DIAGNOSIS — I10 Essential (primary) hypertension: Secondary | ICD-10-CM | POA: Diagnosis present

## 2019-12-11 DIAGNOSIS — M810 Age-related osteoporosis without current pathological fracture: Secondary | ICD-10-CM | POA: Diagnosis present

## 2019-12-11 DIAGNOSIS — Z888 Allergy status to other drugs, medicaments and biological substances status: Secondary | ICD-10-CM

## 2019-12-11 DIAGNOSIS — G629 Polyneuropathy, unspecified: Secondary | ICD-10-CM | POA: Diagnosis present

## 2019-12-11 DIAGNOSIS — Z20822 Contact with and (suspected) exposure to covid-19: Secondary | ICD-10-CM | POA: Diagnosis present

## 2019-12-11 DIAGNOSIS — E785 Hyperlipidemia, unspecified: Secondary | ICD-10-CM | POA: Diagnosis present

## 2019-12-11 DIAGNOSIS — Z79899 Other long term (current) drug therapy: Secondary | ICD-10-CM

## 2019-12-11 DIAGNOSIS — I469 Cardiac arrest, cause unspecified: Secondary | ICD-10-CM | POA: Diagnosis present

## 2019-12-11 DIAGNOSIS — Y92009 Unspecified place in unspecified non-institutional (private) residence as the place of occurrence of the external cause: Secondary | ICD-10-CM

## 2019-12-11 DIAGNOSIS — W19XXXA Unspecified fall, initial encounter: Secondary | ICD-10-CM

## 2019-12-11 DIAGNOSIS — S53104A Unspecified dislocation of right ulnohumeral joint, initial encounter: Secondary | ICD-10-CM

## 2019-12-11 LAB — CBC WITH DIFFERENTIAL/PLATELET
Abs Immature Granulocytes: 0.01 10*3/uL (ref 0.00–0.07)
Basophils Absolute: 0 10*3/uL (ref 0.0–0.1)
Basophils Relative: 1 %
Eosinophils Absolute: 0.2 10*3/uL (ref 0.0–0.5)
Eosinophils Relative: 6 %
HCT: 36 % (ref 36.0–46.0)
Hemoglobin: 11.1 g/dL — ABNORMAL LOW (ref 12.0–15.0)
Immature Granulocytes: 0 %
Lymphocytes Relative: 27 %
Lymphs Abs: 1.1 10*3/uL (ref 0.7–4.0)
MCH: 28.8 pg (ref 26.0–34.0)
MCHC: 30.8 g/dL (ref 30.0–36.0)
MCV: 93.3 fL (ref 80.0–100.0)
Monocytes Absolute: 0.4 10*3/uL (ref 0.1–1.0)
Monocytes Relative: 9 %
Neutro Abs: 2.2 10*3/uL (ref 1.7–7.7)
Neutrophils Relative %: 57 %
Platelets: 186 10*3/uL (ref 150–400)
RBC: 3.86 MIL/uL — ABNORMAL LOW (ref 3.87–5.11)
RDW: 14 % (ref 11.5–15.5)
WBC: 4 10*3/uL (ref 4.0–10.5)
nRBC: 0 % (ref 0.0–0.2)

## 2019-12-11 LAB — BASIC METABOLIC PANEL
Anion gap: 10 (ref 5–15)
BUN: 19 mg/dL (ref 8–23)
CO2: 26 mmol/L (ref 22–32)
Calcium: 9.5 mg/dL (ref 8.9–10.3)
Chloride: 105 mmol/L (ref 98–111)
Creatinine, Ser: 0.86 mg/dL (ref 0.44–1.00)
GFR calc Af Amer: >60 mL/min (ref >60)
GFR calc non Af Amer: >60 mL/min (ref >60)
Glucose, Bld: 130 mg/dL — ABNORMAL HIGH (ref 70–99)
Potassium: 4.2 mmol/L (ref 3.5–5.1)
Sodium: 141 mmol/L (ref 135–145)

## 2019-12-11 IMAGING — DX DG HUMERUS 2V *R*
2 series · 2 of 2 positions shown · non-contrast
Comparison: None.

CLINICAL DATA: Status post fall.

EXAM:
RIGHT HUMERUS - 2+ VIEW

[humerus ap]
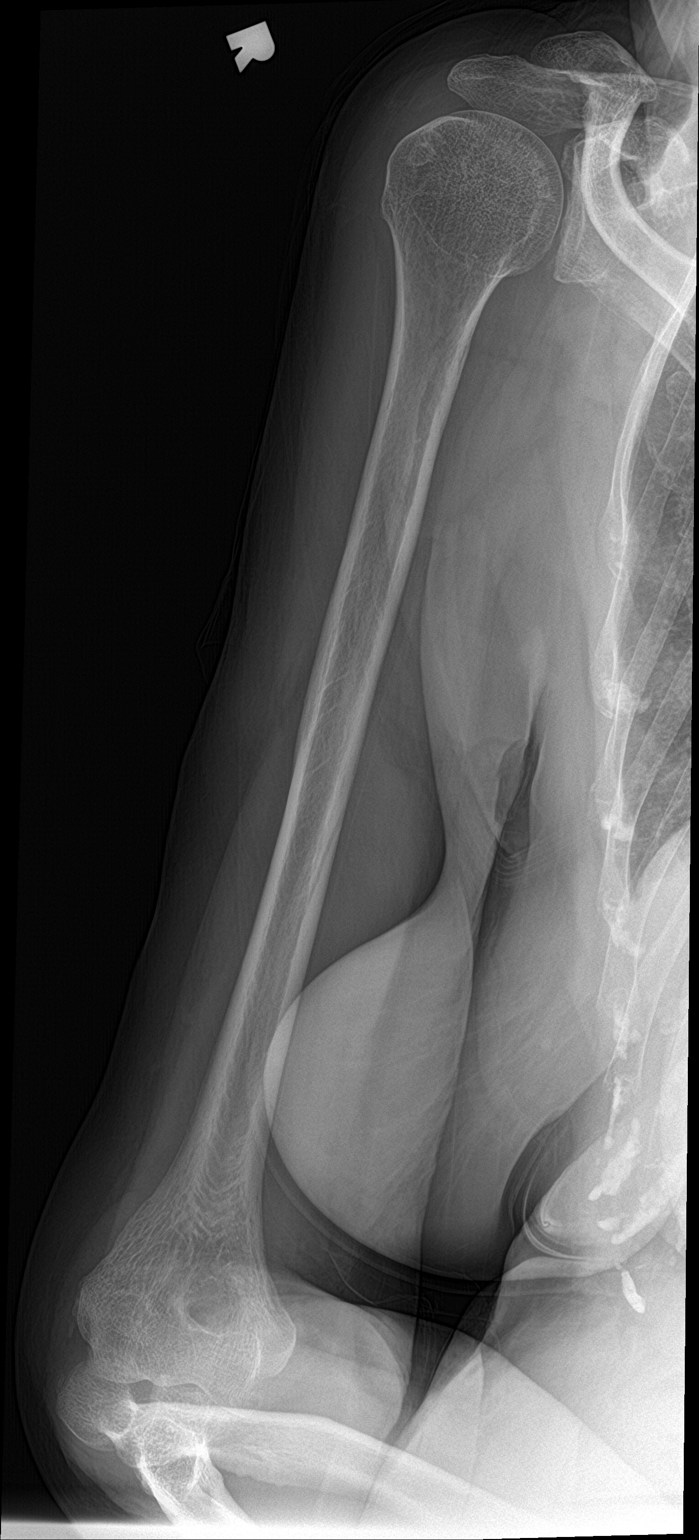

[humerus lat]
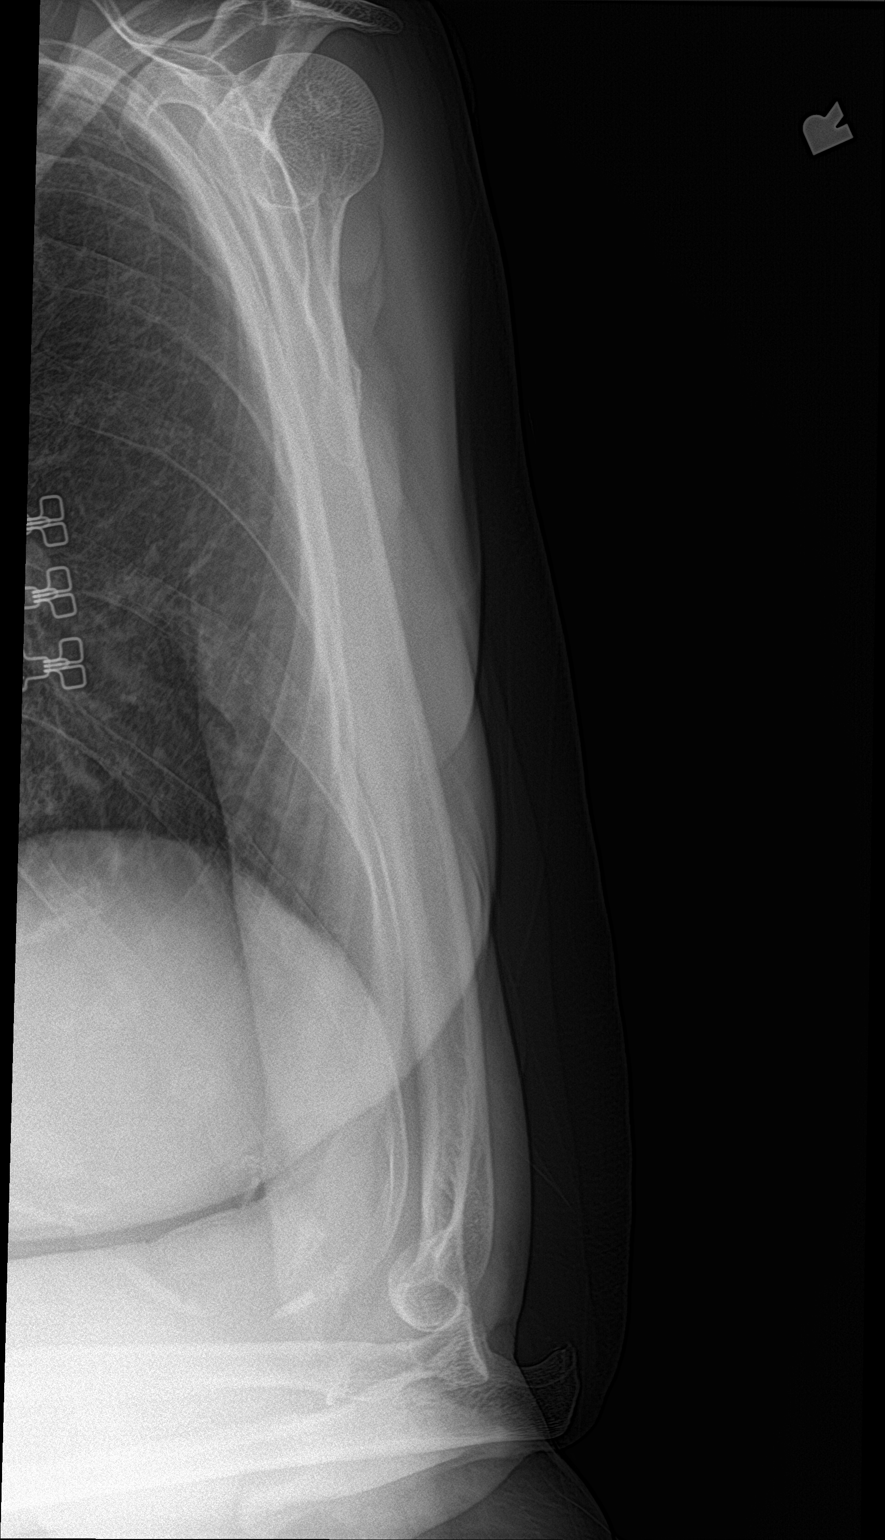

[2 of 2 positions shown; findings below may reference images not displayed]

FINDINGS: Posterior dislocation of the right elbow is seen. No acute fracture
deformities are identified. Soft tissues are unremarkable.
IMPRESSION: Posterior dislocation of the right elbow.

## 2019-12-11 IMAGING — DX DG RIBS W/ CHEST 3+V*R*
5 series · 5 of 5 positions shown · non-contrast
Comparison: [DATE]

CLINICAL DATA: Status post fall.

EXAM:
RIGHT RIBS AND CHEST - 3+ VIEW

[chest pa]
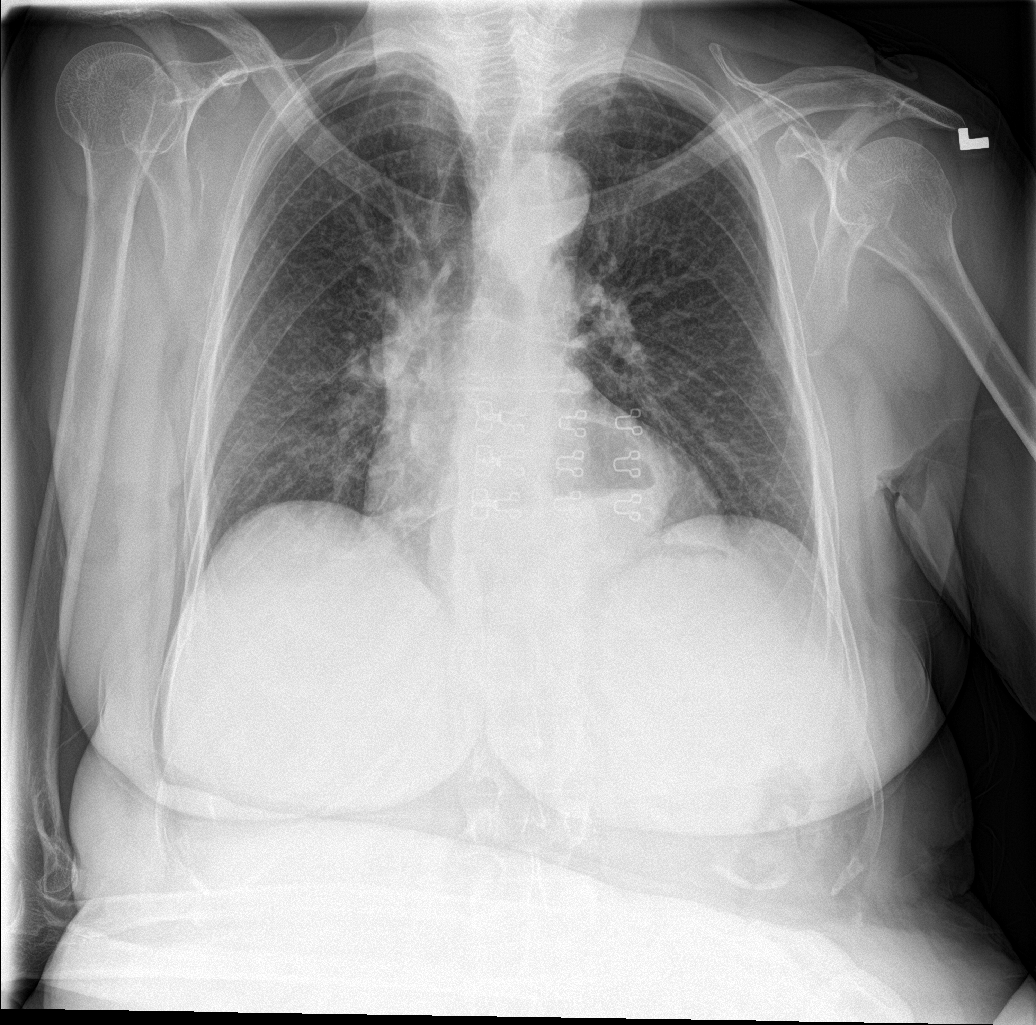

[rib pa]
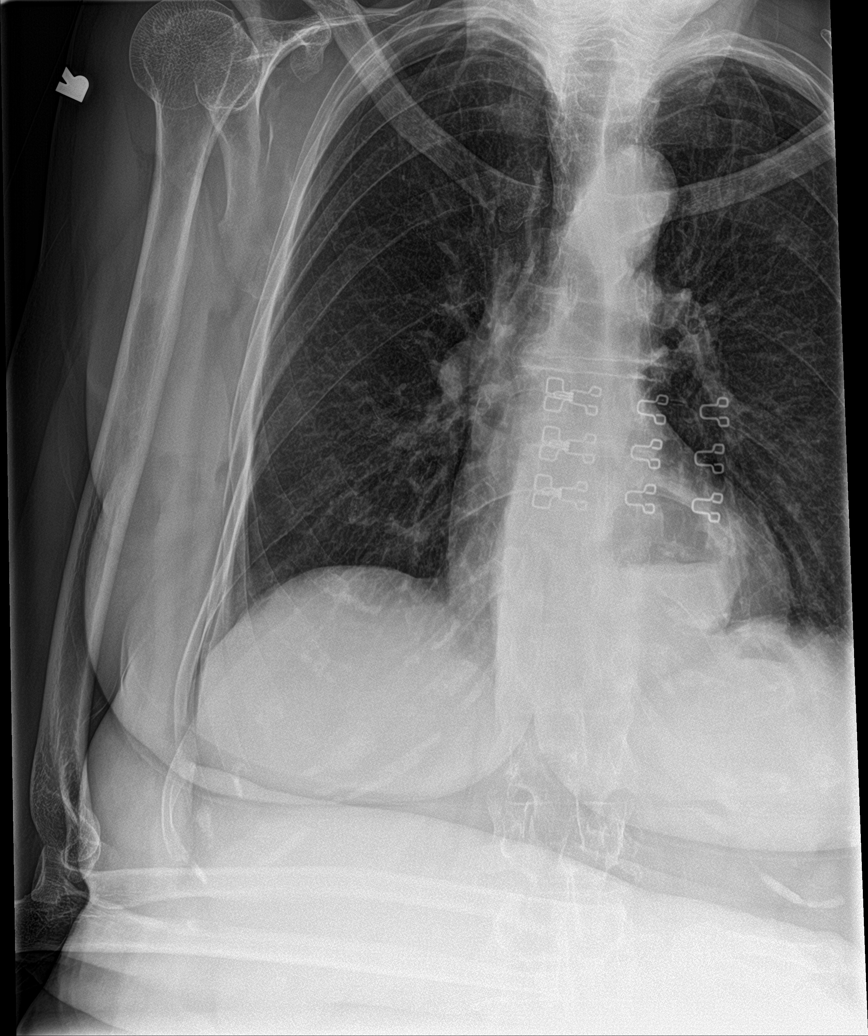

[rib pa obl (1 of 3)]
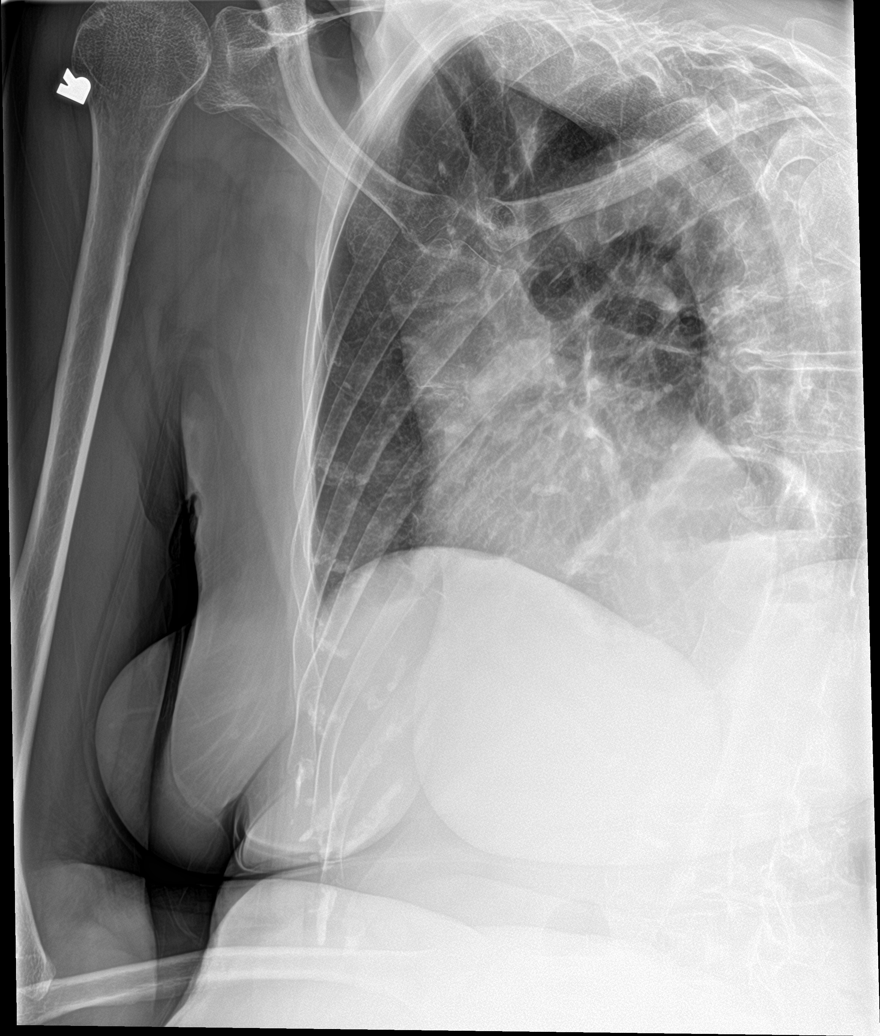

[rib pa obl (2 of 3)]
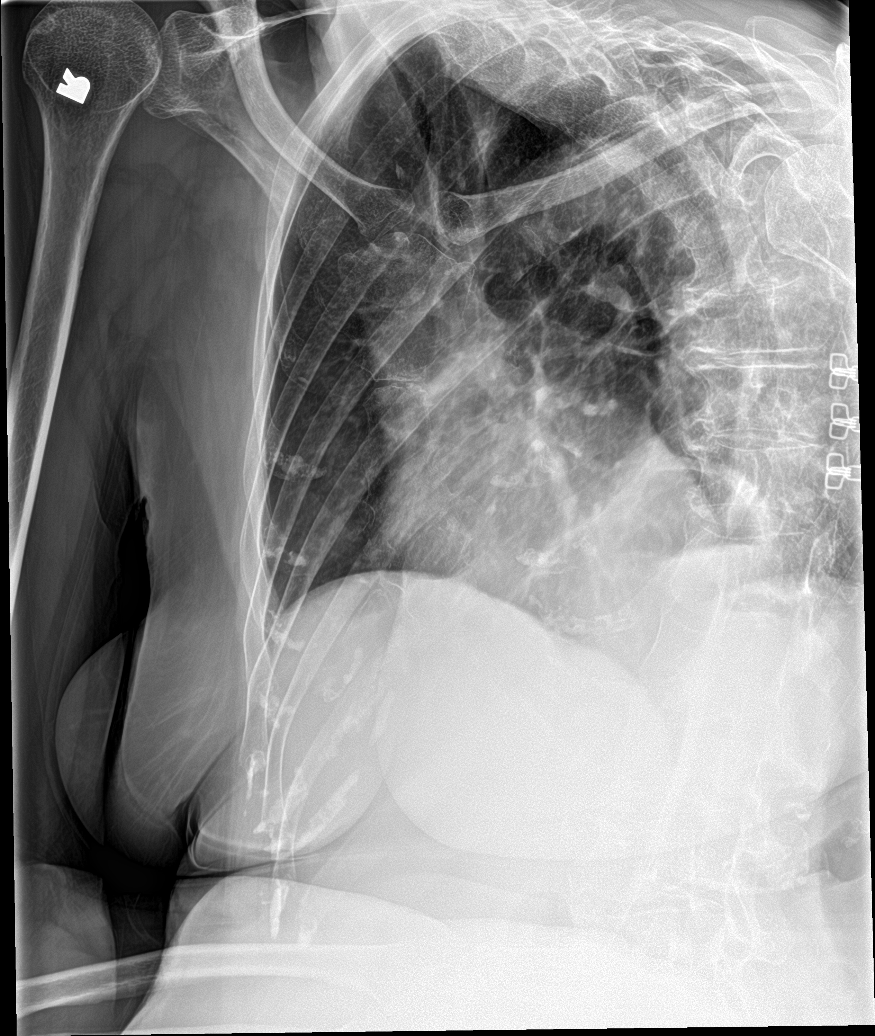

[rib pa obl (3 of 3)]
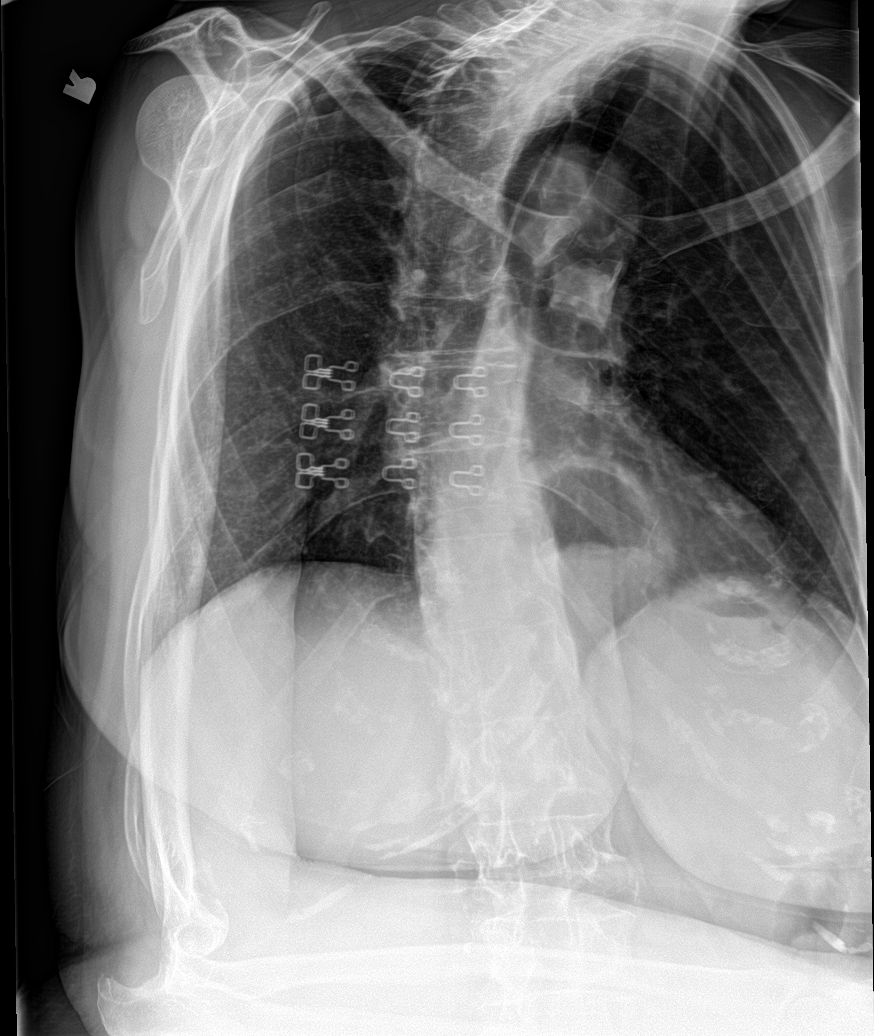

[5 of 5 positions shown; findings below may reference images not displayed]

FINDINGS: No fracture or other bone lesions are seen involving the ribs.
Posterior dislocation of the right elbow is seen. There is no
evidence of pneumothorax or pleural effusion. Both lungs are clear.
A large, stable hiatal hernia is seen. Heart size and mediastinal
contours are within normal limits.
IMPRESSION: 1. No acute rib fracture.
2. Posterior dislocation of the right elbow.
3. Large, stable hiatal hernia.
*

## 2019-12-11 MED ORDER — FENTANYL CITRATE (PF) 100 MCG/2ML IJ SOLN: 50.0000 ug | Freq: Once | INTRAMUSCULAR | Status: DC

## 2019-12-11 MED ORDER — FENTANYL CITRATE (PF) 100 MCG/2ML IJ SOLN
100.0000 ug | Freq: Once | INTRAMUSCULAR | Status: AC
  Administered 2019-12-12: 100 ug via INTRAVENOUS
  Filled 2019-12-11: qty 2

## 2019-12-11 MED ORDER — KETOROLAC TROMETHAMINE 30 MG/ML IJ SOLN
30.0000 mg | Freq: Once | INTRAMUSCULAR | Status: AC
  Administered 2019-12-12: 30 mg via INTRAVENOUS
  Filled 2019-12-11: qty 1

## 2019-12-11 NOTE — ED Triage Notes (Signed)
Patient tripped on a swing while watering her plants at home this evening , denies LOC /ambulatory , presents with right upper arm and right ribcage pain , alert and oriented , respirations unlabored .

## 2019-12-12 ENCOUNTER — Encounter (HOSPITAL_COMMUNITY): Payer: Self-pay | Admitting: Internal Medicine

## 2019-12-12 ENCOUNTER — Encounter (HOSPITAL_COMMUNITY)
Admission: EM | Disposition: A | Discharge status: Home / Self Care | Payer: Self-pay | Source: Home / Self Care | Attending: Internal Medicine

## 2019-12-12 ENCOUNTER — Observation Stay (HOSPITAL_BASED_OUTPATIENT_CLINIC_OR_DEPARTMENT_OTHER): Payer: Medicare Other

## 2019-12-12 ENCOUNTER — Observation Stay (HOSPITAL_COMMUNITY): Payer: Medicare Other | Admitting: Certified Registered"

## 2019-12-12 ENCOUNTER — Inpatient Hospital Stay (HOSPITAL_COMMUNITY): Payer: Medicare Other

## 2019-12-12 ENCOUNTER — Emergency Department (HOSPITAL_COMMUNITY): Payer: Medicare Other

## 2019-12-12 DIAGNOSIS — Y92009 Unspecified place in unspecified non-institutional (private) residence as the place of occurrence of the external cause: Secondary | ICD-10-CM | POA: Diagnosis not present

## 2019-12-12 DIAGNOSIS — Z85828 Personal history of other malignant neoplasm of skin: Secondary | ICD-10-CM | POA: Diagnosis not present

## 2019-12-12 DIAGNOSIS — I10 Essential (primary) hypertension: Secondary | ICD-10-CM | POA: Diagnosis present

## 2019-12-12 DIAGNOSIS — W1830XA Fall on same level, unspecified, initial encounter: Secondary | ICD-10-CM | POA: Diagnosis present

## 2019-12-12 DIAGNOSIS — R7303 Prediabetes: Secondary | ICD-10-CM | POA: Diagnosis present

## 2019-12-12 DIAGNOSIS — Z888 Allergy status to other drugs, medicaments and biological substances status: Secondary | ICD-10-CM | POA: Diagnosis not present

## 2019-12-12 DIAGNOSIS — G629 Polyneuropathy, unspecified: Secondary | ICD-10-CM | POA: Diagnosis present

## 2019-12-12 DIAGNOSIS — Z20822 Contact with and (suspected) exposure to covid-19: Secondary | ICD-10-CM | POA: Diagnosis present

## 2019-12-12 DIAGNOSIS — R55 Syncope and collapse: Secondary | ICD-10-CM | POA: Diagnosis present

## 2019-12-12 DIAGNOSIS — X501XXA Overexertion from prolonged static or awkward postures, initial encounter: Secondary | ICD-10-CM | POA: Diagnosis not present

## 2019-12-12 DIAGNOSIS — M810 Age-related osteoporosis without current pathological fracture: Secondary | ICD-10-CM | POA: Diagnosis present

## 2019-12-12 DIAGNOSIS — Z91041 Radiographic dye allergy status: Secondary | ICD-10-CM | POA: Diagnosis not present

## 2019-12-12 DIAGNOSIS — Z7951 Long term (current) use of inhaled steroids: Secondary | ICD-10-CM | POA: Diagnosis not present

## 2019-12-12 DIAGNOSIS — Z7982 Long term (current) use of aspirin: Secondary | ICD-10-CM | POA: Diagnosis not present

## 2019-12-12 DIAGNOSIS — S53124A Posterior dislocation of right ulnohumeral joint, initial encounter: Secondary | ICD-10-CM | POA: Diagnosis present

## 2019-12-12 DIAGNOSIS — I469 Cardiac arrest, cause unspecified: Secondary | ICD-10-CM | POA: Diagnosis present

## 2019-12-12 DIAGNOSIS — J449 Chronic obstructive pulmonary disease, unspecified: Secondary | ICD-10-CM | POA: Diagnosis present

## 2019-12-12 DIAGNOSIS — Z79899 Other long term (current) drug therapy: Secondary | ICD-10-CM | POA: Diagnosis not present

## 2019-12-12 DIAGNOSIS — Z87440 Personal history of urinary (tract) infections: Secondary | ICD-10-CM | POA: Diagnosis not present

## 2019-12-12 DIAGNOSIS — E785 Hyperlipidemia, unspecified: Secondary | ICD-10-CM | POA: Diagnosis present

## 2019-12-12 DIAGNOSIS — Z8249 Family history of ischemic heart disease and other diseases of the circulatory system: Secondary | ICD-10-CM | POA: Diagnosis not present

## 2019-12-12 HISTORY — PX: CLOSED REDUCTION ELBOW FRACTURE: SHX930

## 2019-12-12 LAB — VITAMIN D 25 HYDROXY (VIT D DEFICIENCY, FRACTURES): Vit D, 25-Hydroxy: 56.06 ng/mL (ref 30–100)

## 2019-12-12 LAB — RESPIRATORY PANEL BY RT PCR (FLU A&B, COVID)
Influenza A by PCR: NEGATIVE
Influenza B by PCR: NEGATIVE
SARS Coronavirus 2 by RT PCR: NEGATIVE

## 2019-12-12 LAB — ECHOCARDIOGRAM COMPLETE
Height: 67 in
Weight: 3174.62 oz

## 2019-12-12 LAB — TROPONIN I (HIGH SENSITIVITY): Troponin I (High Sensitivity): 4 ng/L (ref <18)

## 2019-12-12 LAB — MAGNESIUM: Magnesium: 1.9 mg/dL (ref 1.7–2.4)

## 2019-12-12 LAB — CBG MONITORING, ED: Glucose-Capillary: 112 mg/dL — ABNORMAL HIGH (ref 70–99)

## 2019-12-12 LAB — TSH: TSH: 0.949 u[IU]/mL (ref 0.350–4.500)

## 2019-12-12 IMAGING — DX DG ELBOW 2V*R*
1 series · 3 of 3 positions shown · non-contrast
Comparison: Plain film of the wrist dated [DATE].

CLINICAL DATA: Status post closed reduction RIGHT elbow.

EXAM:
RIGHT ELBOW - 2 VIEW

[Series 1: elbow · 0.14mm/px · 3 of 3 slices shown]
[im 1/3]
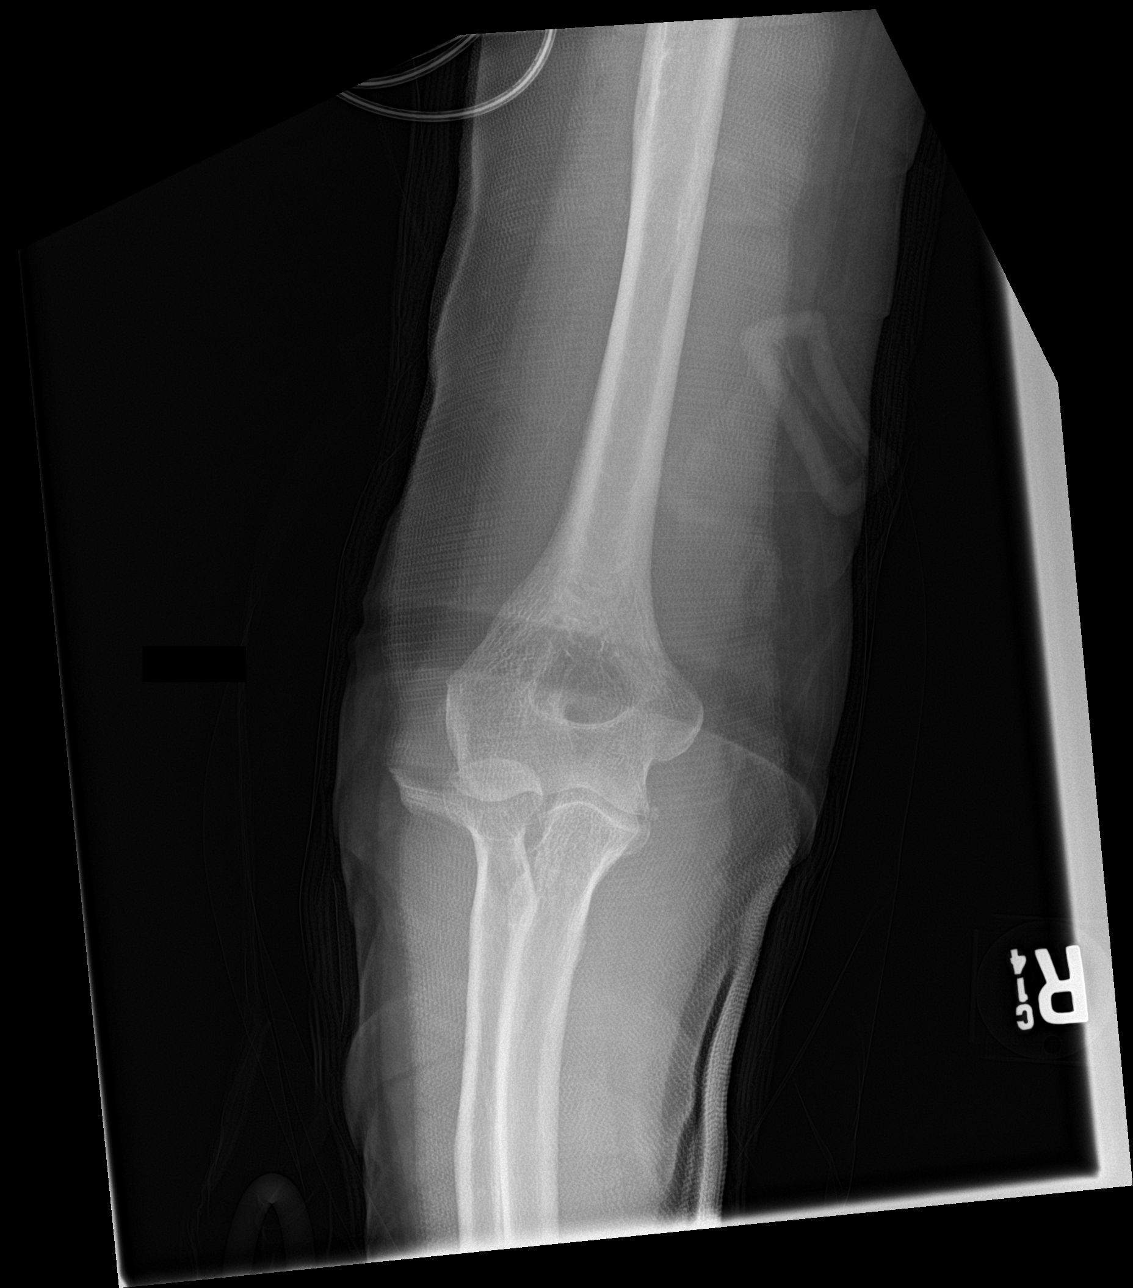
[im 2/3]
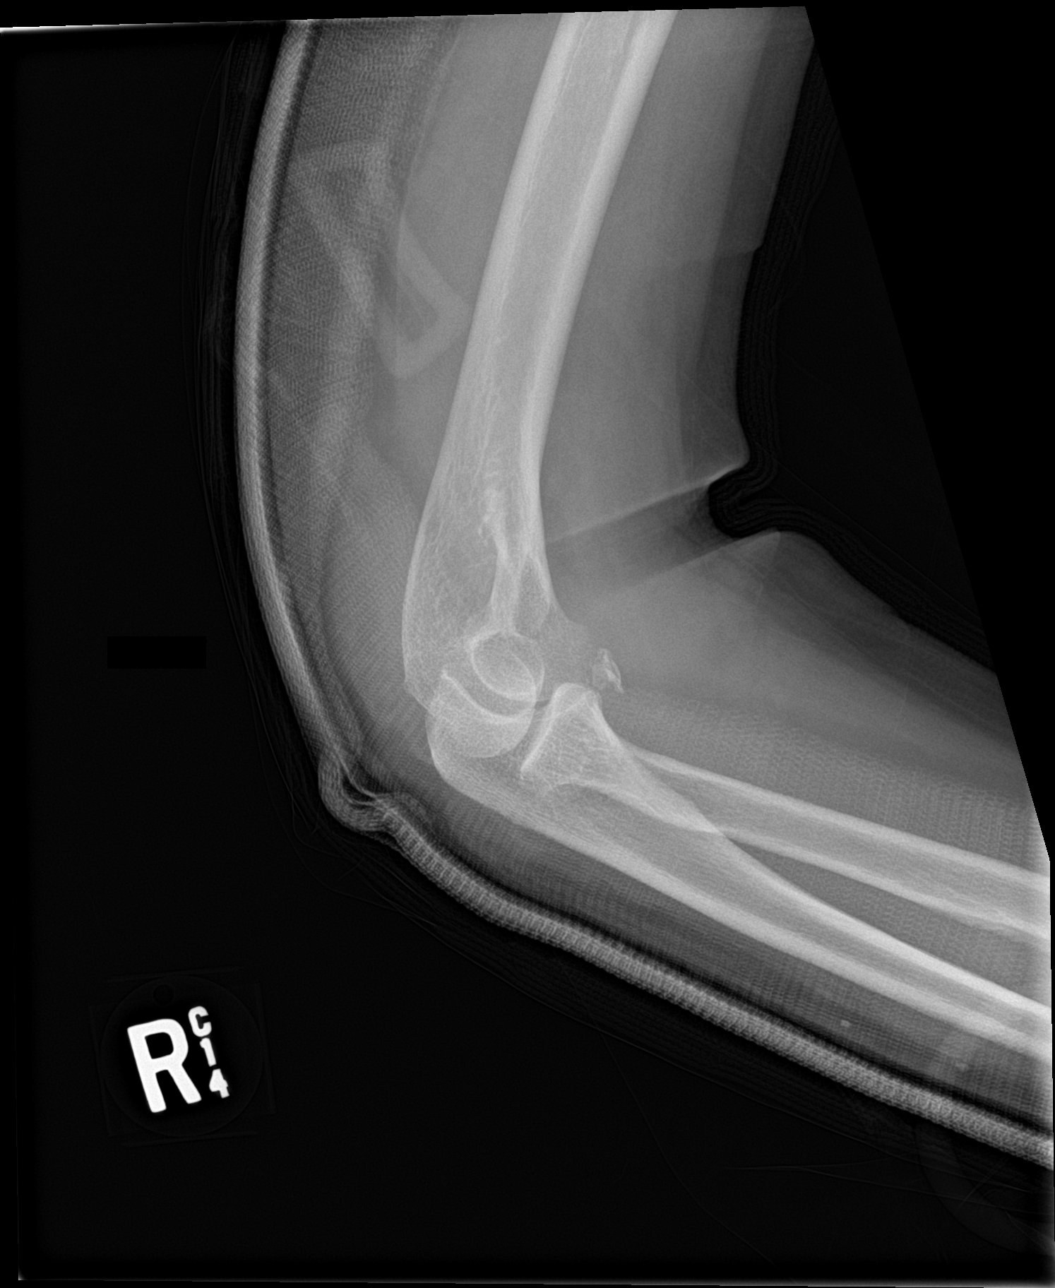
[im 3/3]
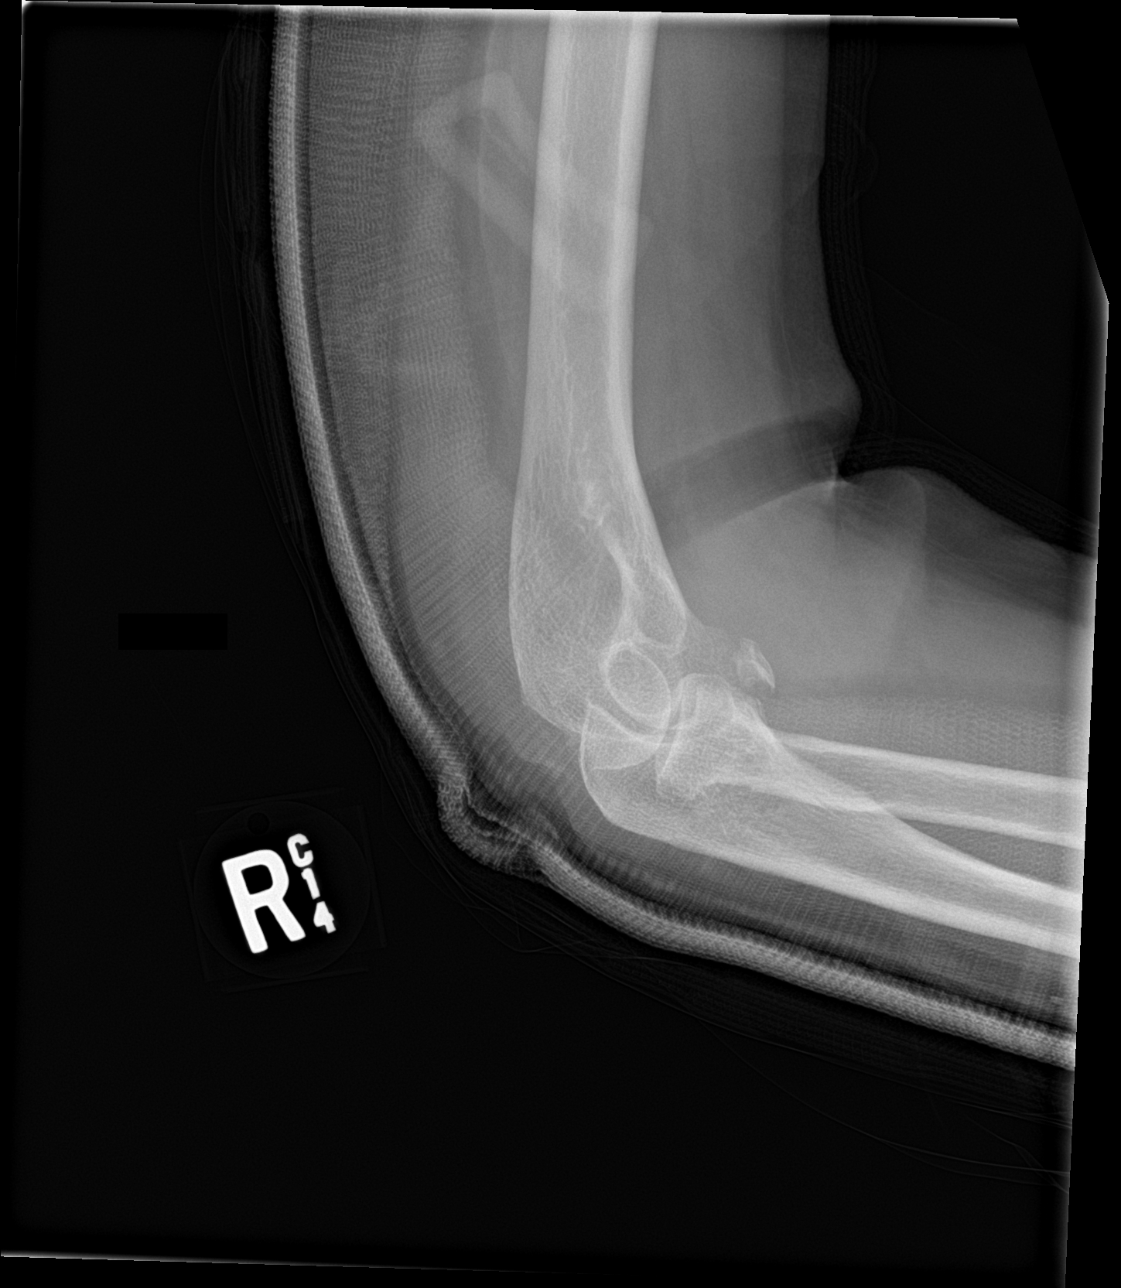

[3 of 3 positions shown; findings below may reference images not displayed]

FINDINGS: Osseous alignment is now anatomic. 8 mm avulsion fracture fragment
is now seen at the anterior margin of the elbow. Overlying casting
in place.
IMPRESSION: 1. Anatomic alignment status post reduction and casting.
2. Displaced avulsion fracture, measuring 8 mm, now seen at the
anterior margin of the elbow.

## 2019-12-12 IMAGING — RF DG C-ARM 1-60 MIN
1 series · 5 of 5 positions shown · non-contrast
Comparison: [DATE]

CLINICAL DATA: Relocation of RIGHT elbow dislocation.

EXAM:
RIGHT ELBOW - COMPLETE 3+ VIEW; DG C-ARM 1-60 MIN

[Series 1: run · 5 of 5 slices shown]
[im 1/5]
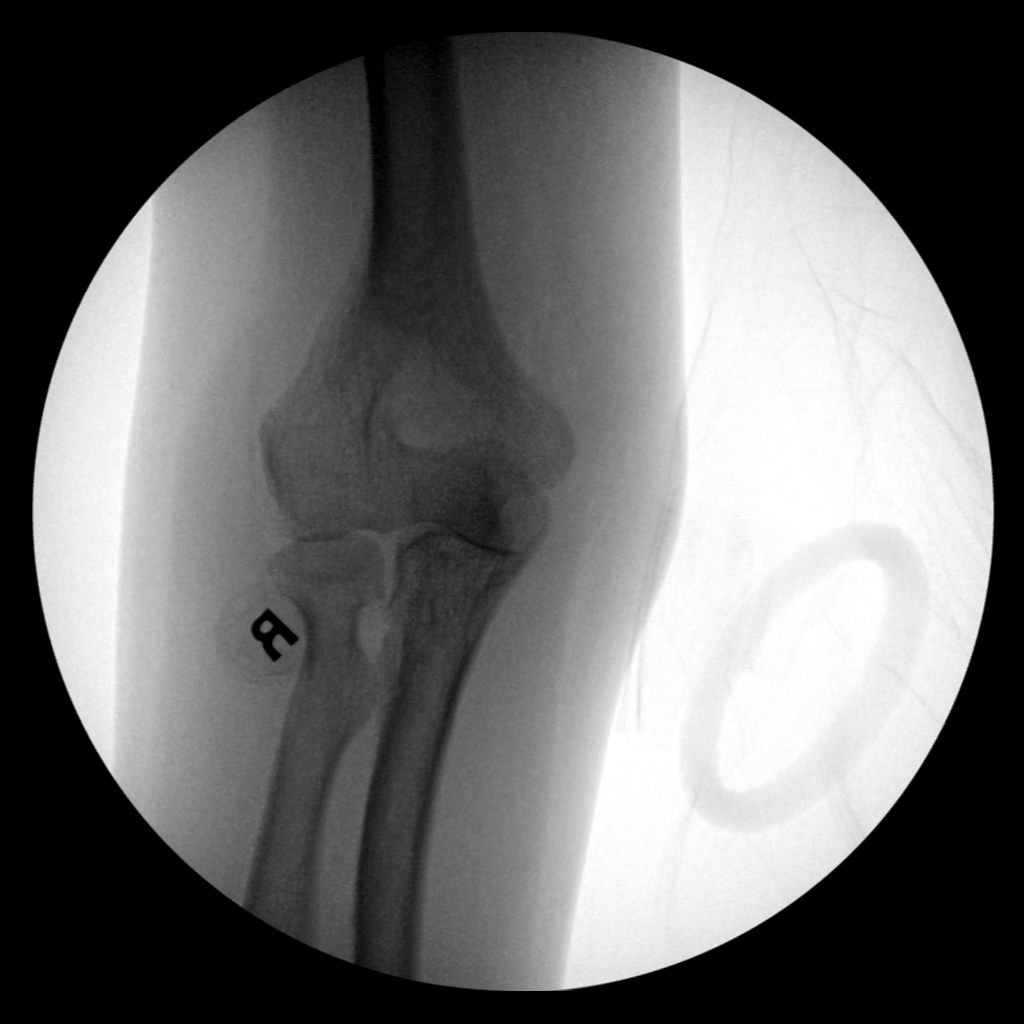
[im 2/5]
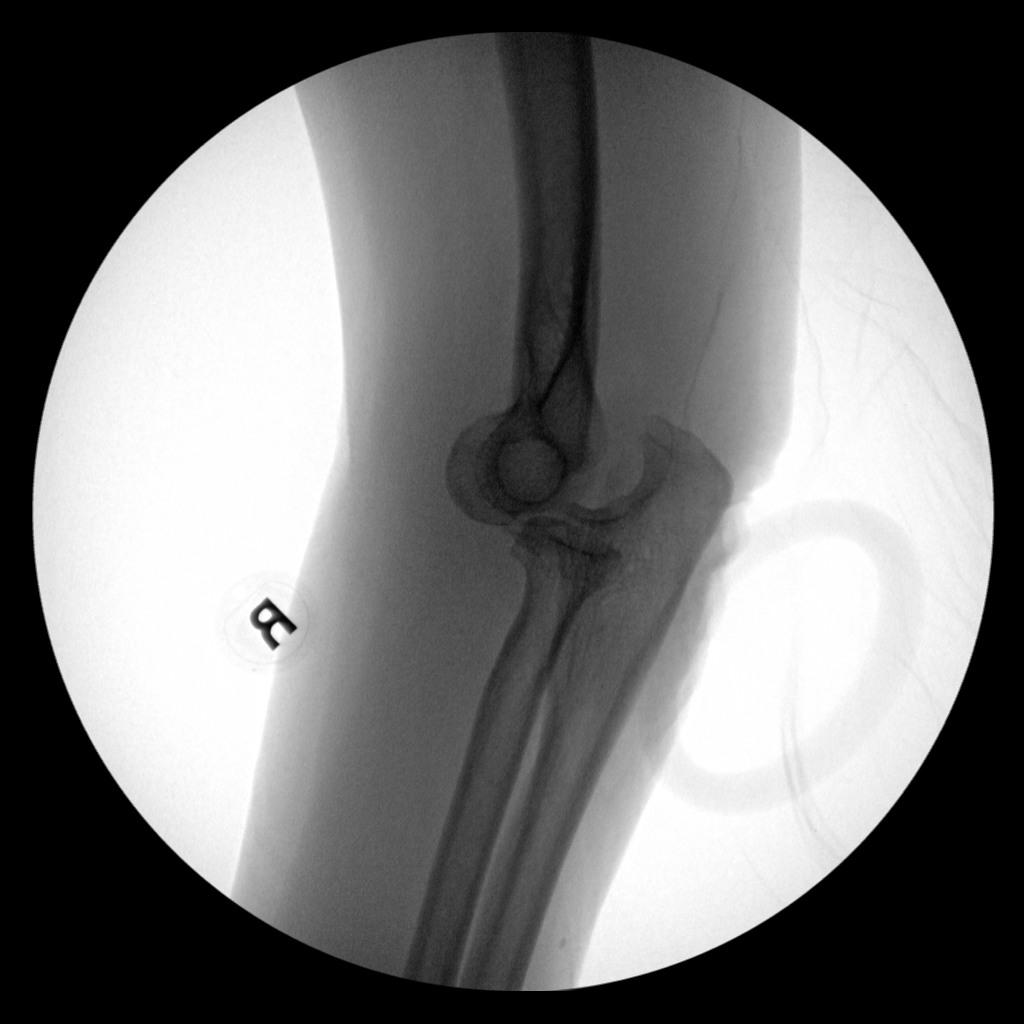
[im 3/5]
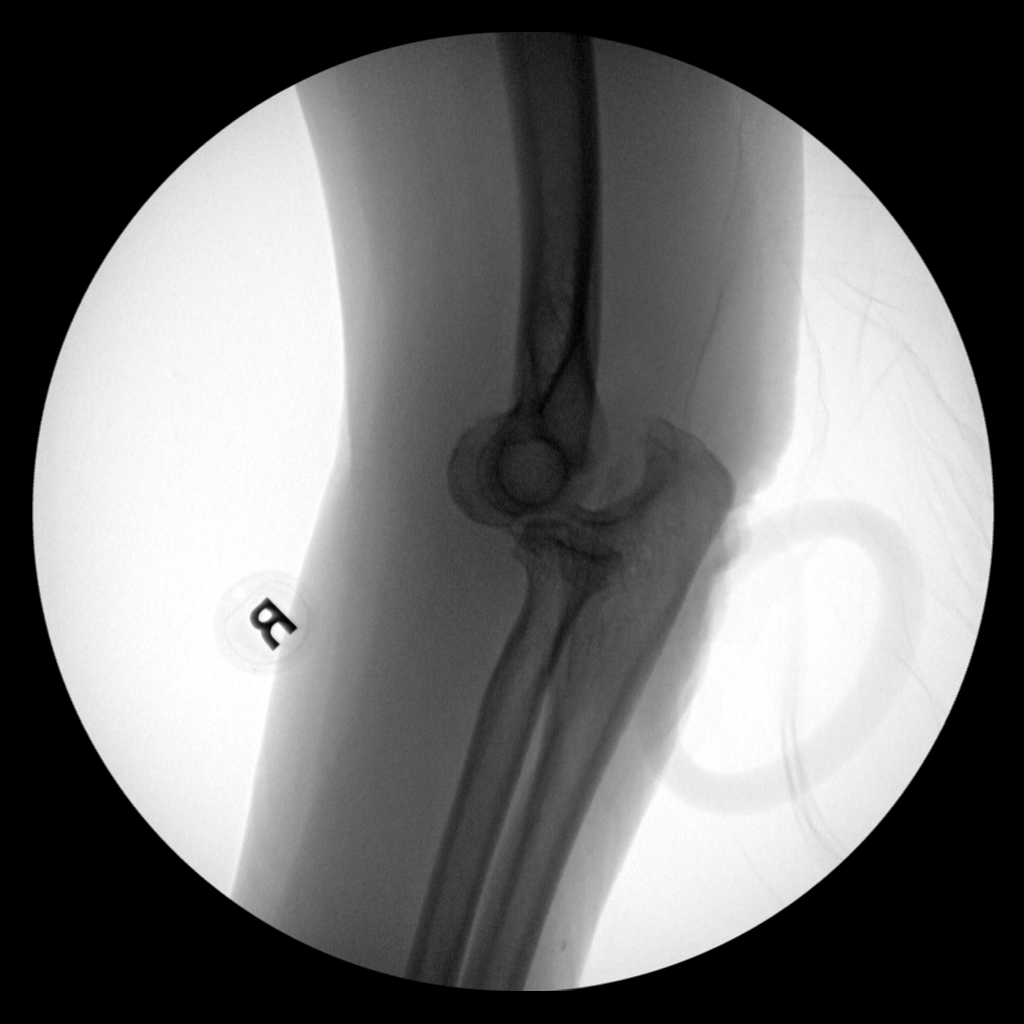
[im 4/5]
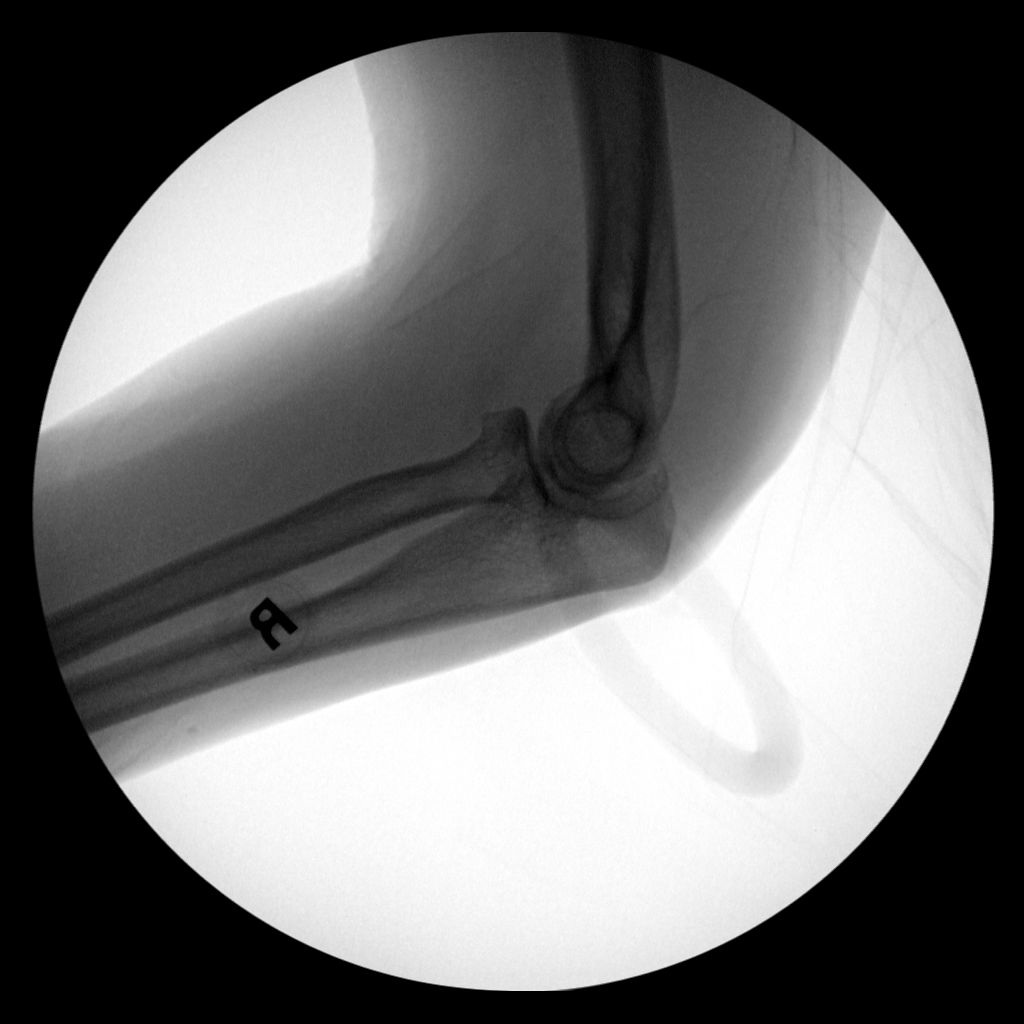
[im 5/5]
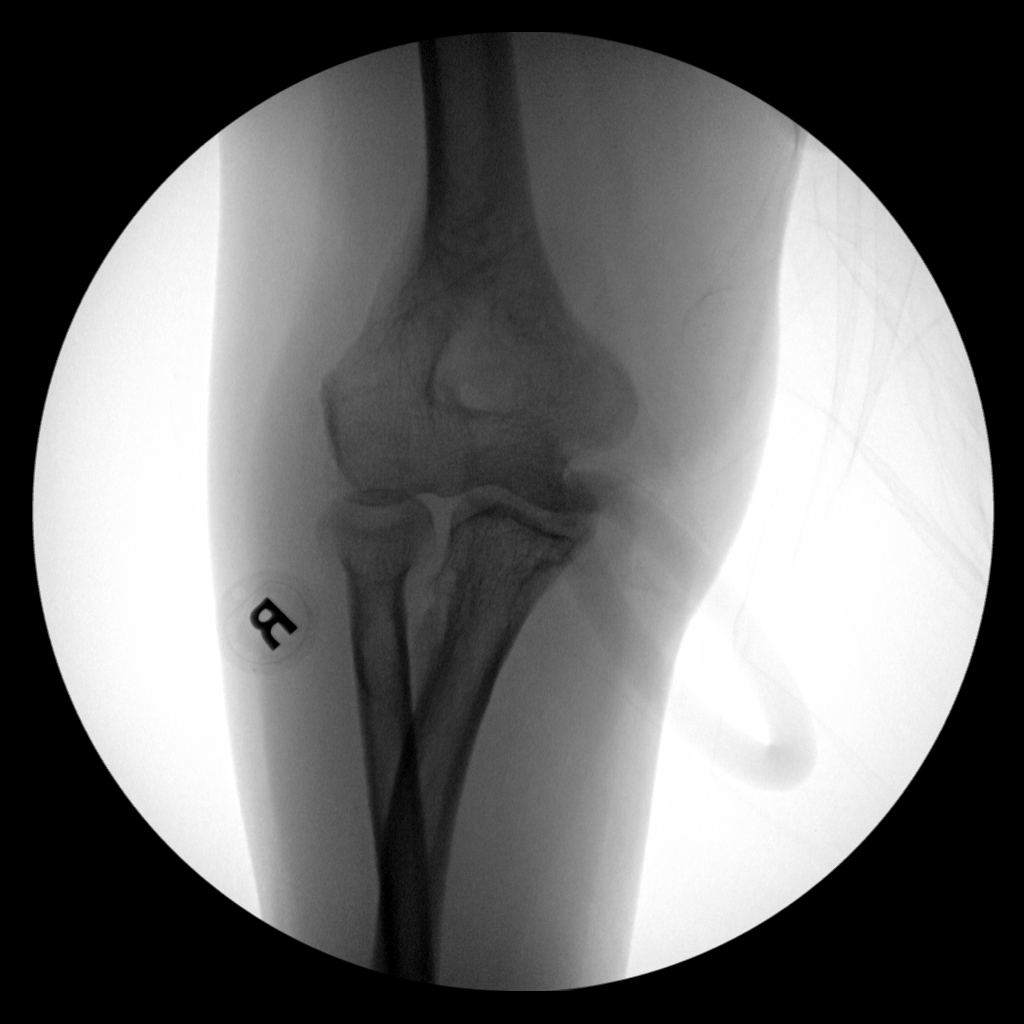

[5 of 5 positions shown; findings below may reference images not displayed]

FINDINGS: Intraoperative spot views of the RIGHT elbow are submitted
postoperatively for interpretation.

The RIGHT UPPER lobe is located on the LEFT two views. No definite
fracture is noted on these intraoperative views.
IMPRESSION: Relocation of the RIGHT elbow.

## 2019-12-12 IMAGING — RF DG ELBOW COMPLETE 3+V*R*
1 series · 5 of 5 positions shown · non-contrast
Comparison: [DATE]

CLINICAL DATA: Relocation of RIGHT elbow dislocation.

EXAM:
RIGHT ELBOW - COMPLETE 3+ VIEW; DG C-ARM 1-60 MIN

[Series 1: run · 5 of 5 slices shown]
[im 1/5]
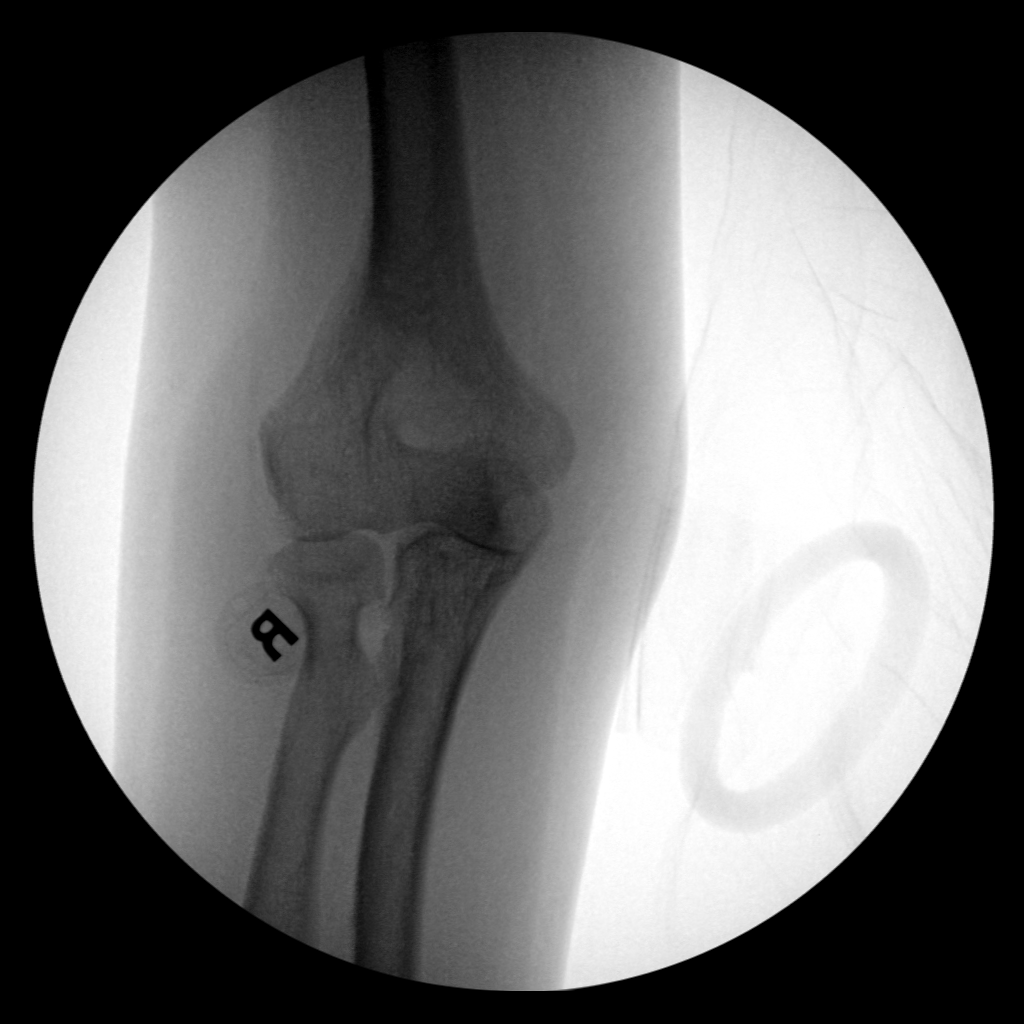
[im 2/5]
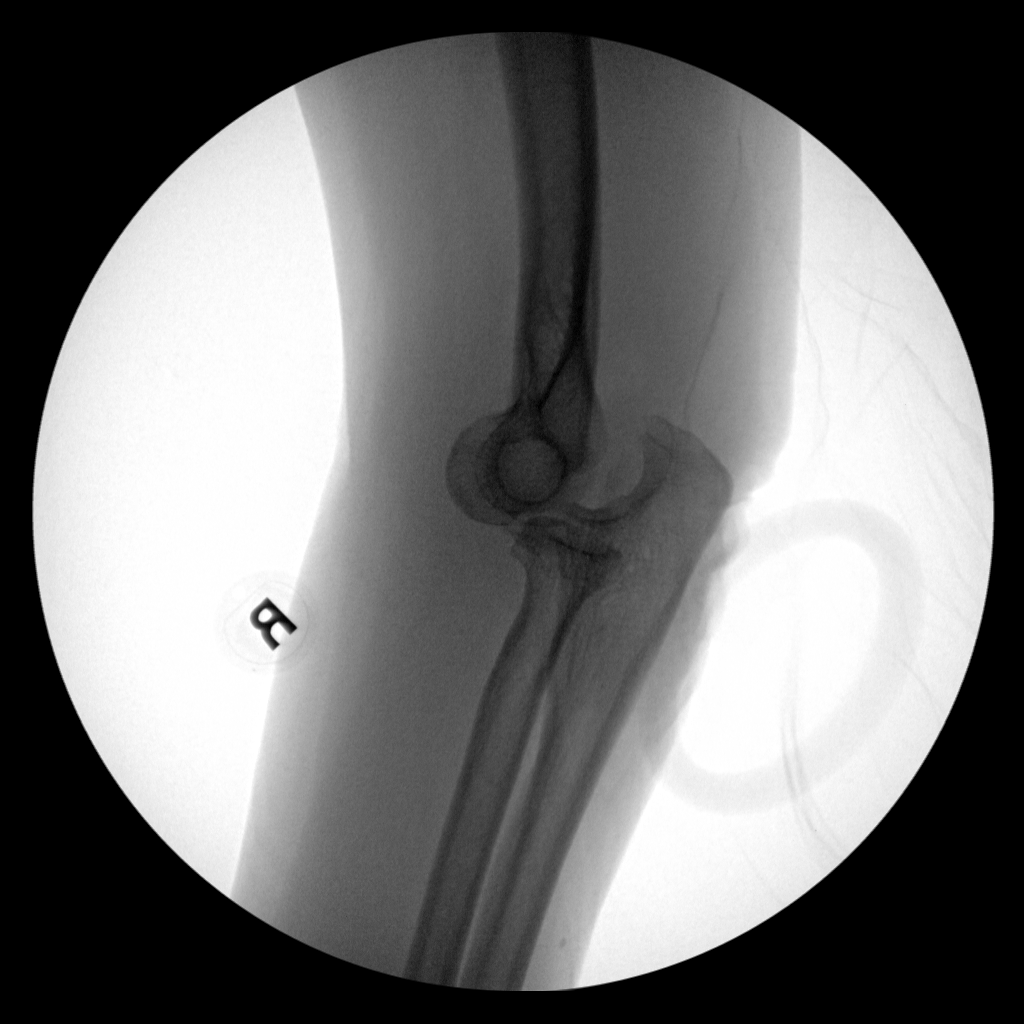
[im 3/5]
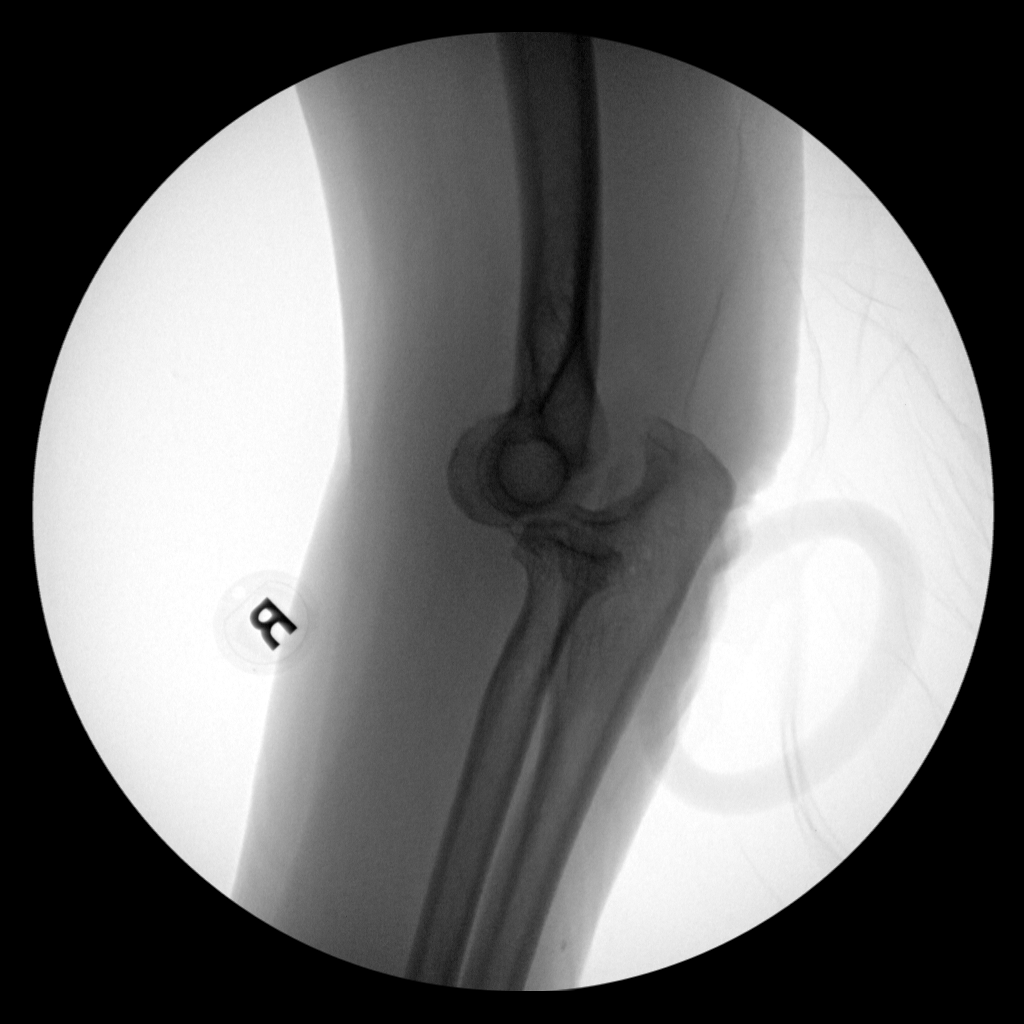
[im 4/5]
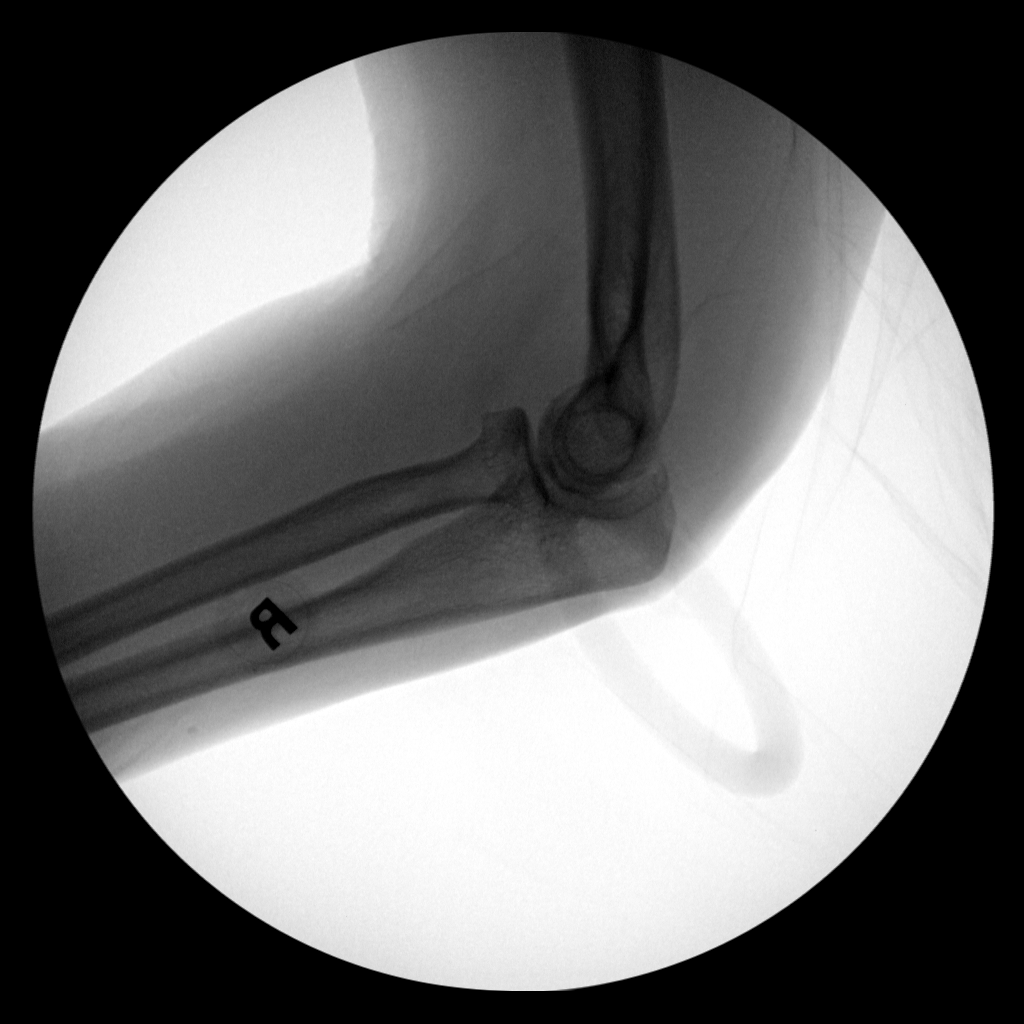
[im 5/5]
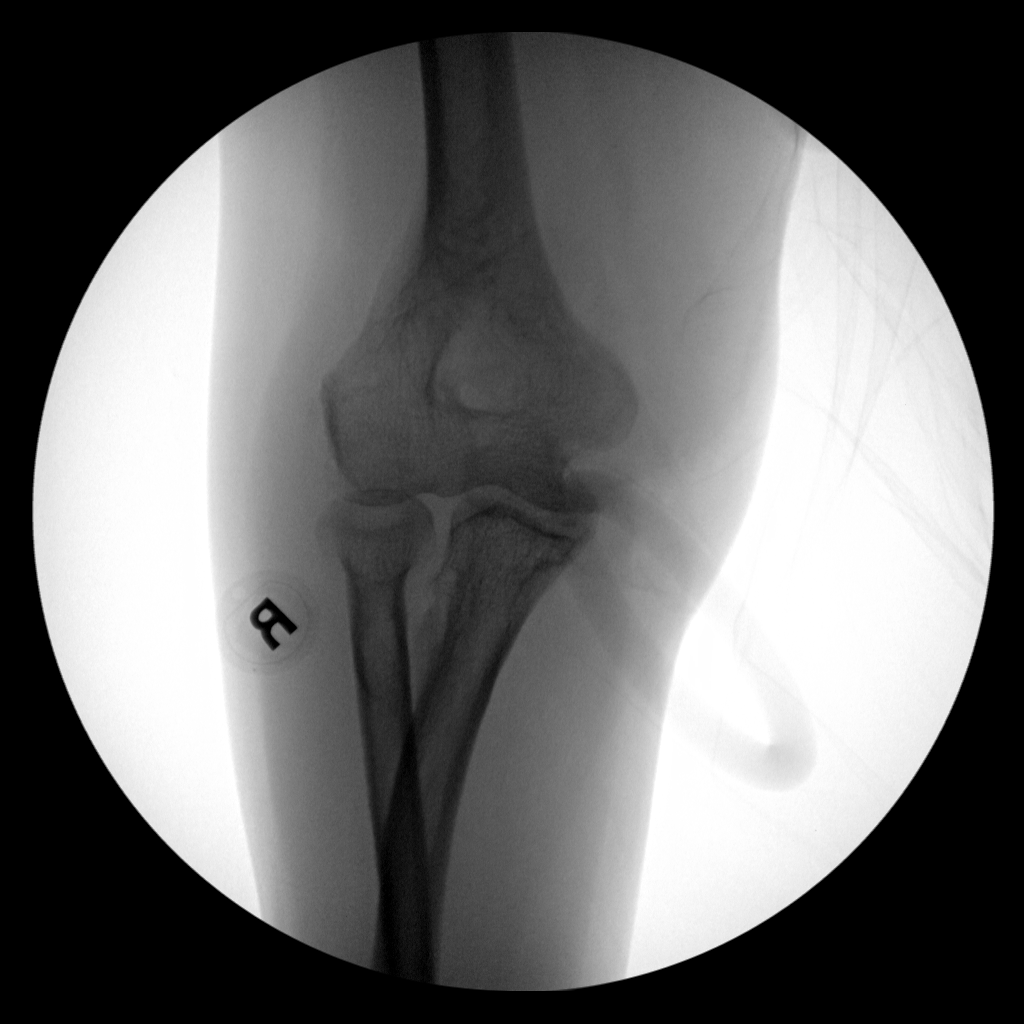

[5 of 5 positions shown; findings below may reference images not displayed]

FINDINGS: Intraoperative spot views of the RIGHT elbow are submitted
postoperatively for interpretation.

The RIGHT UPPER lobe is located on the LEFT two views. No definite
fracture is noted on these intraoperative views.
IMPRESSION: Relocation of the RIGHT elbow.

## 2019-12-12 IMAGING — CR DG KNEE COMPLETE 4+V*R*
4 series · 4 of 4 positions shown · non-contrast
Comparison: None.

CLINICAL DATA: Status post fall.

EXAM:
RIGHT KNEE - COMPLETE 4+ VIEW

[knee ap]
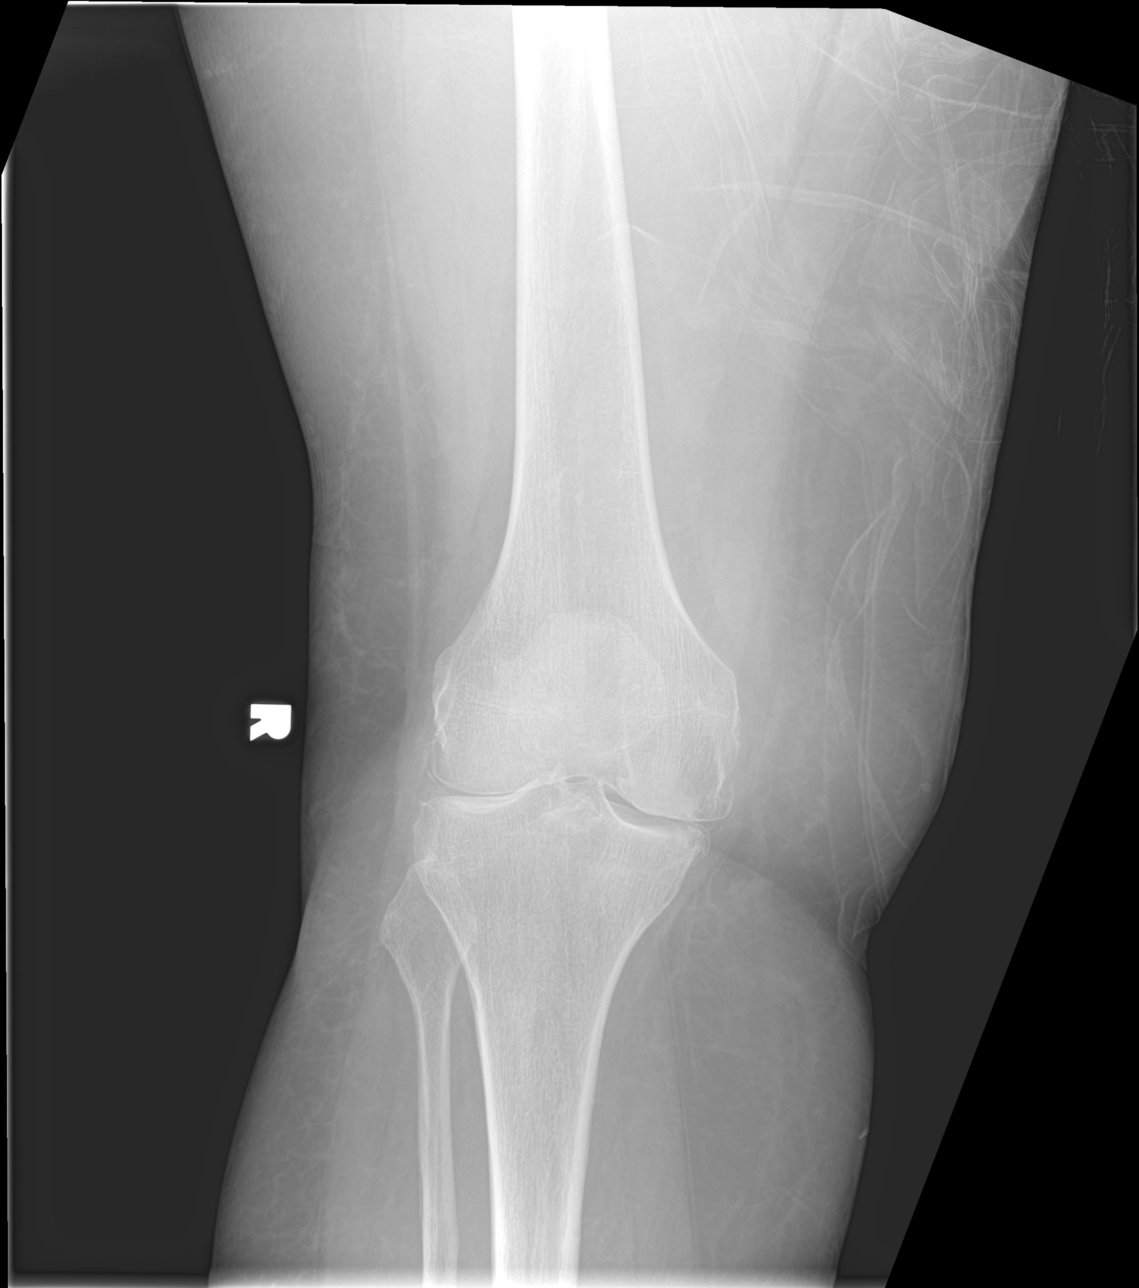

[knee lat]
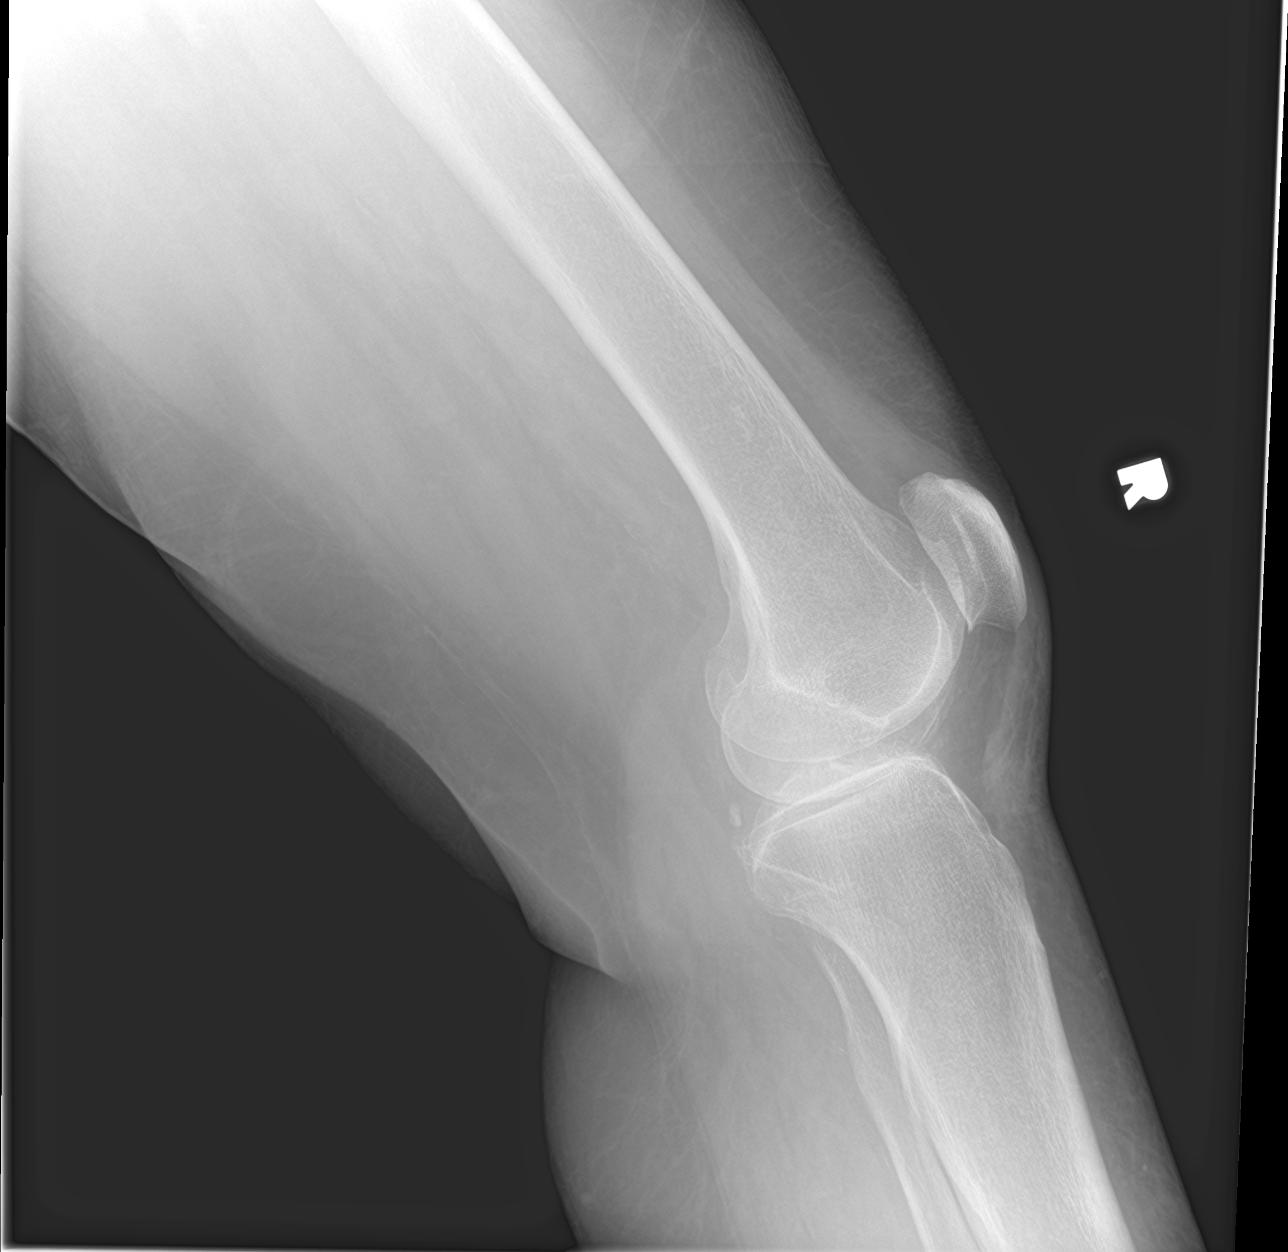

[knee obl (1 of 2)]
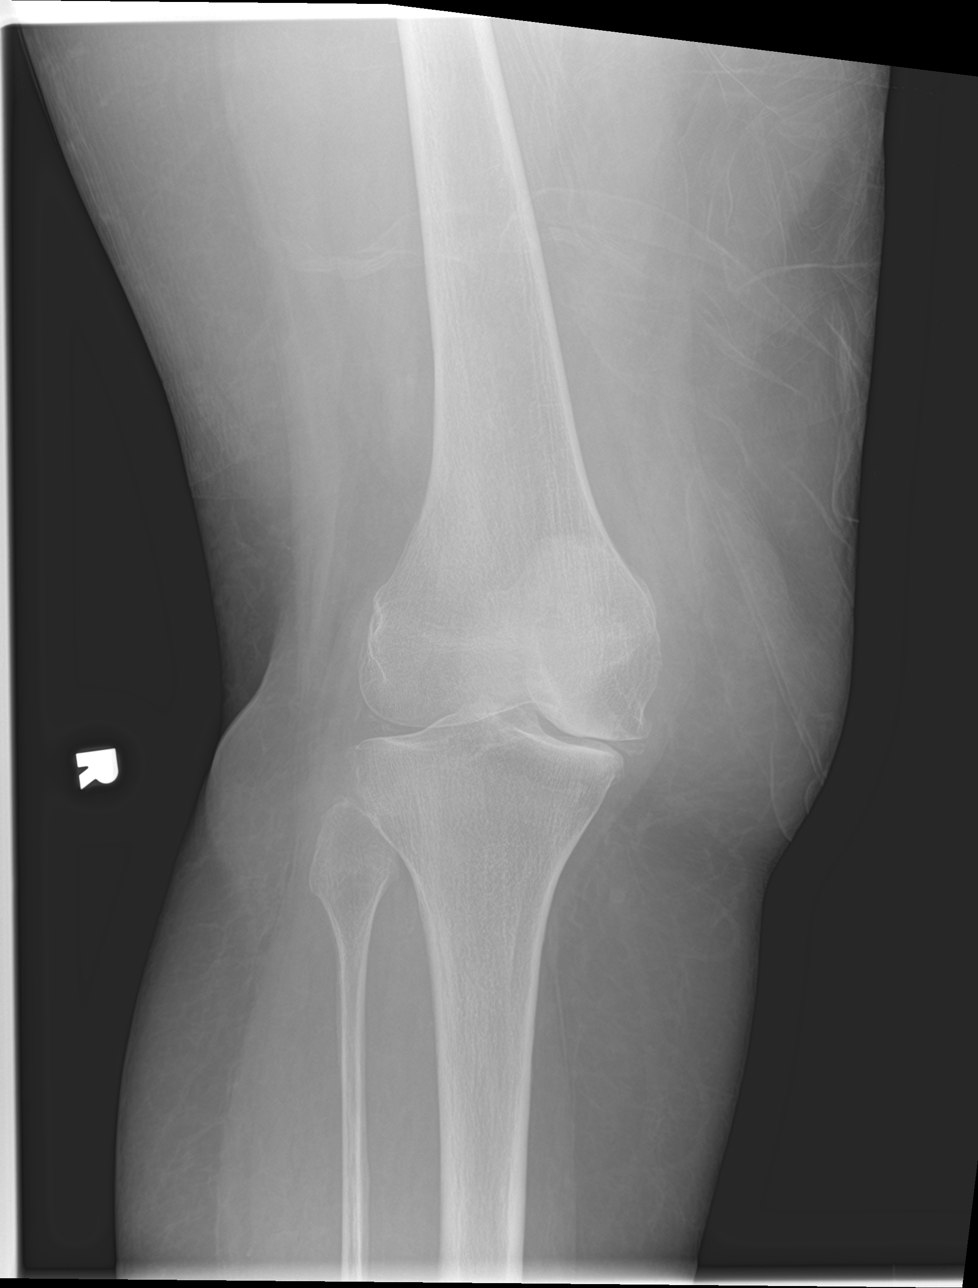

[knee obl (2 of 2)]
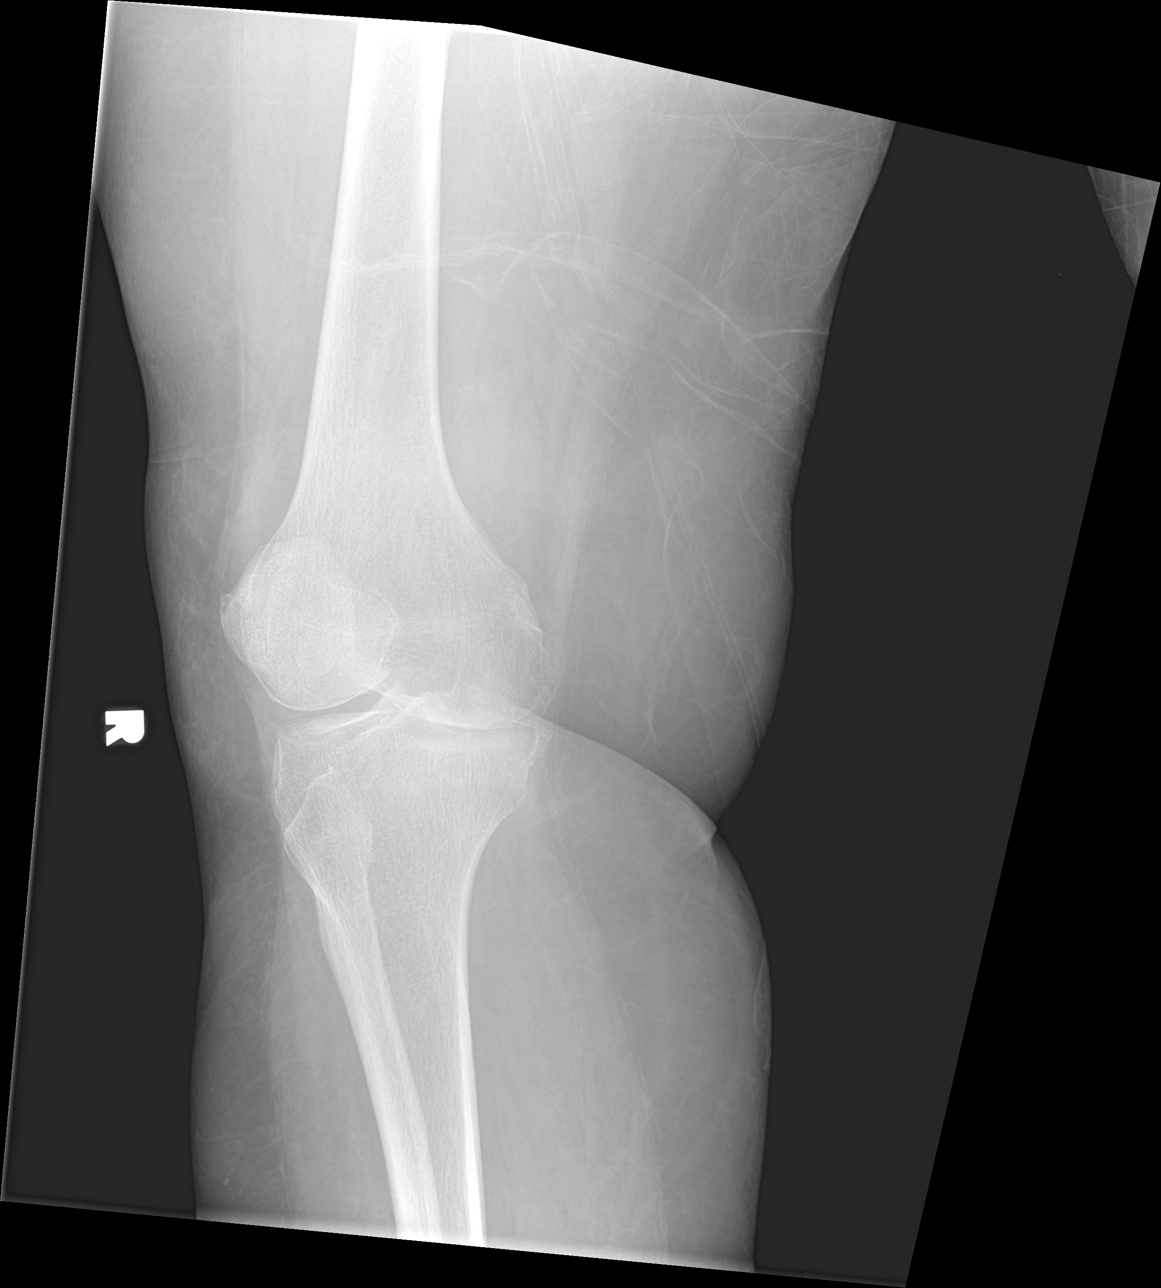

[4 of 4 positions shown; findings below may reference images not displayed]

FINDINGS: No evidence of fracture, dislocation, or joint effusion. There is
marked severity narrowing of the medial and lateral tibiofemoral
compartment spaces. Mild patellofemoral narrowing is also seen. Very
mild lateral chondrocalcinosis is seen. Soft tissues are
unremarkable.
IMPRESSION: Marked severity degenerative changes without evidence of acute
osseous abnormality.

## 2019-12-12 SURGERY — CLOSED REDUCTION, ELBOW
Anesthesia: General | Site: Elbow | Laterality: Right

## 2019-12-12 MED ORDER — MOMETASONE FURO-FORMOTEROL FUM 200-5 MCG/ACT IN AERO
2.0000 | INHALATION_SPRAY | Freq: Two times a day (BID) | RESPIRATORY_TRACT | Status: DC
  Administered 2019-12-12 – 2019-12-13 (×2): 2 via RESPIRATORY_TRACT
  Filled 2019-12-12: qty 8.8

## 2019-12-12 MED ORDER — IPRATROPIUM-ALBUTEROL 0.5-2.5 (3) MG/3ML IN SOLN: 3.0000 mL | RESPIRATORY_TRACT | Status: DC | PRN

## 2019-12-12 MED ORDER — FAMOTIDINE 20 MG PO TABS
20.0000 mg | ORAL_TABLET | Freq: Two times a day (BID) | ORAL | Status: DC
  Administered 2019-12-12 – 2019-12-13 (×2): 20 mg via ORAL
  Filled 2019-12-12 (×3): qty 1

## 2019-12-12 MED ORDER — ONDANSETRON HCL 4 MG/2ML IJ SOLN: 4.0000 mg | Freq: Four times a day (QID) | INTRAMUSCULAR | Status: DC | PRN

## 2019-12-12 MED ORDER — GABAPENTIN 100 MG PO CAPS
100.0000 mg | ORAL_CAPSULE | Freq: Every day | ORAL | Status: DC
  Administered 2019-12-12: 100 mg via ORAL
  Filled 2019-12-12: qty 1

## 2019-12-12 MED ORDER — SENNOSIDES-DOCUSATE SODIUM 8.6-50 MG PO TABS: 2.0000 | ORAL_TABLET | Freq: Every evening | ORAL | Status: DC | PRN

## 2019-12-12 MED ORDER — ATORVASTATIN CALCIUM 10 MG PO TABS
20.0000 mg | ORAL_TABLET | Freq: Every day | ORAL | Status: DC
  Administered 2019-12-12: 20 mg via ORAL
  Filled 2019-12-12: qty 2

## 2019-12-12 MED ORDER — PROPOFOL 10 MG/ML IV BOLUS
INTRAVENOUS | Status: AC
  Filled 2019-12-12: qty 40

## 2019-12-12 MED ORDER — MIDAZOLAM HCL 2 MG/2ML IJ SOLN
INTRAMUSCULAR | Status: AC
  Filled 2019-12-12: qty 2

## 2019-12-12 MED ORDER — ONDANSETRON HCL 4 MG/2ML IJ SOLN
INTRAMUSCULAR | Status: DC | PRN
  Administered 2019-12-12: 4 mg via INTRAVENOUS

## 2019-12-12 MED ORDER — ACETAMINOPHEN 500 MG PO TABS
ORAL_TABLET | ORAL | Status: AC
  Administered 2019-12-12: 1000 mg via ORAL
  Filled 2019-12-12: qty 2

## 2019-12-12 MED ORDER — FENTANYL CITRATE (PF) 100 MCG/2ML IJ SOLN: 25.0000 ug | INTRAMUSCULAR | Status: DC | PRN

## 2019-12-12 MED ORDER — ONDANSETRON HCL 4 MG/2ML IJ SOLN: 4.0000 mg | Freq: Once | INTRAMUSCULAR | Status: DC | PRN

## 2019-12-12 MED ORDER — FENTANYL CITRATE (PF) 100 MCG/2ML IJ SOLN
INTRAMUSCULAR | Status: AC
  Administered 2019-12-12: 50 ug via INTRAVENOUS
  Filled 2019-12-12: qty 2

## 2019-12-12 MED ORDER — FENTANYL CITRATE (PF) 250 MCG/5ML IJ SOLN
INTRAMUSCULAR | Status: AC
  Filled 2019-12-12: qty 5

## 2019-12-12 MED ORDER — OXYCODONE HCL 5 MG PO TABS
5.0000 mg | ORAL_TABLET | ORAL | Status: DC | PRN
  Administered 2019-12-12 – 2019-12-13 (×3): 5 mg via ORAL
  Filled 2019-12-12 (×2): qty 1

## 2019-12-12 MED ORDER — POLYETHYLENE GLYCOL 3350 17 G PO PACK: 17.0000 g | PACK | Freq: Every day | ORAL | Status: DC | PRN

## 2019-12-12 MED ORDER — ALBUTEROL SULFATE (2.5 MG/3ML) 0.083% IN NEBU: 2.5000 mg | INHALATION_SOLUTION | Freq: Four times a day (QID) | RESPIRATORY_TRACT | Status: DC | PRN

## 2019-12-12 MED ORDER — PROPOFOL 10 MG/ML IV BOLUS
INTRAVENOUS | Status: DC | PRN
  Administered 2019-12-12: 80 mg via INTRAVENOUS

## 2019-12-12 MED ORDER — ONDANSETRON HCL 4 MG/2ML IJ SOLN
INTRAMUSCULAR | Status: AC
  Filled 2019-12-12: qty 2

## 2019-12-12 MED ORDER — FENTANYL CITRATE (PF) 100 MCG/2ML IJ SOLN
25.0000 ug | INTRAMUSCULAR | Status: DC | PRN
  Administered 2019-12-12: 25 ug via INTRAVENOUS
  Filled 2019-12-12: qty 2

## 2019-12-12 MED ORDER — SUCCINYLCHOLINE CHLORIDE 20 MG/ML IJ SOLN
INTRAMUSCULAR | Status: DC | PRN
  Administered 2019-12-12: 100 mg via INTRAVENOUS

## 2019-12-12 MED ORDER — FENTANYL CITRATE (PF) 100 MCG/2ML IJ SOLN
INTRAMUSCULAR | Status: DC | PRN
  Administered 2019-12-12 (×2): 25 ug via INTRAVENOUS

## 2019-12-12 MED ORDER — PROPOFOL 10 MG/ML IV BOLUS
INTRAVENOUS | Status: AC
  Filled 2019-12-12: qty 20

## 2019-12-12 MED ORDER — LACTATED RINGERS IV SOLN: INTRAVENOUS | Status: DC | PRN

## 2019-12-12 MED ORDER — ACETAMINOPHEN 500 MG PO TABS: 1000.0000 mg | ORAL_TABLET | Freq: Once | ORAL | Status: AC

## 2019-12-12 MED ORDER — LOSARTAN POTASSIUM 50 MG PO TABS
100.0000 mg | ORAL_TABLET | Freq: Every day | ORAL | Status: DC
  Administered 2019-12-12 – 2019-12-13 (×2): 100 mg via ORAL
  Filled 2019-12-12 (×3): qty 2

## 2019-12-12 MED ORDER — FENTANYL CITRATE (PF) 100 MCG/2ML IJ SOLN
50.0000 ug | INTRAMUSCULAR | Status: DC | PRN
  Administered 2019-12-12 (×2): 50 ug via INTRAVENOUS
  Filled 2019-12-12: qty 2

## 2019-12-12 MED ORDER — OXYCODONE HCL 5 MG PO TABS
ORAL_TABLET | ORAL | Status: AC
  Filled 2019-12-12: qty 1

## 2019-12-12 MED ORDER — ONDANSETRON HCL 4 MG PO TABS: 4.0000 mg | ORAL_TABLET | Freq: Four times a day (QID) | ORAL | Status: DC | PRN

## 2019-12-12 SURGICAL SUPPLY — 3 items
BNDG ELASTIC 3X5.8 VLCR STR LF (GAUZE/BANDAGES/DRESSINGS) ×1 IMPLANT
BNDG ELASTIC 4X5.8 VLCR STR LF (GAUZE/BANDAGES/DRESSINGS) ×1 IMPLANT
SPLINT FIBERGLASS 4X30 (CAST SUPPLIES) ×1 IMPLANT

## 2019-12-12 NOTE — Progress Notes (Signed)
Dr. Doreatha Martin called and notified about pt eating food at 0530 this morning. Pt will be delayed until noon 12:00pm. OR desk notified.

## 2019-12-12 NOTE — ED Notes (Signed)
Pt was able to use bed path with no issue. Pt urinated 169ml.

## 2019-12-12 NOTE — Progress Notes (Signed)
Echocardiogram 2D Echocardiogram has been performed.  [name] 12/12/2019, 10:56 AM

## 2019-12-12 NOTE — Progress Notes (Signed)
Pt states she "had piece of [place], 2 pieces of cheese, 2 packs of honey graham crackers, and 1 container of applesauce and water at 05:30 this morning 12/12/19. Dr. Doroteo Glassman made aware.

## 2019-12-12 NOTE — Progress Notes (Signed)
PROGRESS NOTE    Melissa DENHOLM  [id number] DOB: 07/11/1946 DOA: 12/11/2019 PCP: Ralene Ok, MD   Brief Narrative:  73-year with history of COPD, HTN, HLD, migraine, prediabetes had a fall at home suffering from right elbow dislocation.  This was attempted to be reduced in the ER but patient had brief episode of asystole lasting for about 4-6 seconds.  Cardiology was called who recommended monitoring the patient as this was likely vasovagal.  Orthopedic was consulted for surgical reduction   Assessment & Plan:   Principal Problem:   Vaso vagal episode Active Problems:   Prediabetes   History of kidney disease   Dislocated elbow, right, initial encounter   Essential hypertension  Fall causing right elbow dislocation -Unsuccessful under conscious sedation.  Will require OR per orthopedic.  Scheduling per their discretion -Pain control  Brief asystole, suspect vasovagal -We will monitor electrolytes and telemetry.  Case briefly discussed by ER provider with cardiology-recommend monitoring for now -Echocardiogram ordered  History of COPD -As needed bronchodilators  Essential hypertension -Daily losartan  Hyperlipidemia -Lipitor 20 mg daily  Peripheral neuropathy -Gabapentin   DVT prophylaxis: SCDs Code Status: Full code Family Communication: None  Status is: Observation  The patient will require care spanning > 2 midnights and should be moved to inpatient because: Ongoing active pain requiring inpatient pain management  Dispo: The patient is from: Home              Anticipated d/c is to: To be determined              Anticipated d/c date is: 2 days              Patient currently is not medically stable to d/c.  Patient will require reduction of her right elbow in the OR.  Failed attempt at bedsid    Consultants:  Orthopedic Curbside cardiology   Subjective: Unable to see the patient today as she appears to be in operative area.  I reviewed the  chart, will continue monitoring.  Objective: Vitals:   12/12/19 0330 12/12/19 0430 12/12/19 0500 12/12/19 0600  BP: (!) 156/81 (!) 153/72 (!) 155/95 (!) 142/58  Pulse: 79 72 79 78  Resp: 20 18 18 18   Temp:      TempSrc:      SpO2: 96% 94% 98% 95%  Weight:      Height:       No intake or output data in the 24 hours ending 12/12/19 0848 Filed Weights   12/11/19 2157 12/11/19 2158  Weight: 81.6 kg 90 kg     Data Reviewed:   CBC: Recent Labs  Lab 12/11/19 2211  WBC 4.0  NEUTROABS 2.2  HGB 11.1*  HCT 36.0  MCV 93.3  PLT 186   Basic Metabolic Panel: Recent Labs  Lab 12/11/19 2211 12/12/19 0423  NA 141  --   K 4.2  --   CL 105  --   CO2 26  --   GLUCOSE 130*  --   BUN 19  --   CREATININE 0.86  --   CALCIUM 9.5  --   MG  --  1.9   GFR: Estimated Creatinine Clearance: 65.1 mL/min (by C-G formula based on SCr of 0.86 mg/dL). Liver Function Tests: No results for input(s): AST, ALT, ALKPHOS, BILITOT, PROT, ALBUMIN in the last 168 hours. No results for input(s): LIPASE, AMYLASE in the last 168 hours. No results for input(s): AMMONIA in the last 168 hours. Coagulation Profile:  No results for input(s): INR, PROTIME in the last 168 hours. Cardiac Enzymes: No results for input(s): CKTOTAL, CKMB, CKMBINDEX, TROPONINI in the last 168 hours. BNP (last 3 results) No results for input(s): PROBNP in the last 8760 hours. HbA1C: No results for input(s): HGBA1C in the last 72 hours. CBG: Recent Labs  Lab 12/12/19 0344  GLUCAP 112*   Lipid Profile: No results for input(s): CHOL, HDL, LDLCALC, TRIG, CHOLHDL, LDLDIRECT in the last 72 hours. Thyroid Function Tests: Recent Labs    12/12/19 0423  TSH 0.949   Anemia Panel: No results for input(s): VITAMINB12, FOLATE, FERRITIN, TIBC, IRON, RETICCTPCT in the last 72 hours. Sepsis Labs: No results for input(s): PROCALCITON, LATICACIDVEN in the last 168 hours.  Recent Results (from the past 240 hour(s))  Respiratory  Panel by RT PCR (Flu A&B, Covid) - Nasopharyngeal Swab     Status: None   Collection Time: 12/12/19 12:56 AM   Specimen: Nasopharyngeal Swab  Result Value Ref Range Status   SARS Coronavirus 2 by RT PCR NEGATIVE NEGATIVE Final    Comment: (NOTE) SARS-CoV-2 target nucleic acids are NOT DETECTED. The SARS-CoV-2 RNA is generally detectable in upper respiratoy specimens during the acute phase of infection. The lowest concentration of SARS-CoV-2 viral copies this assay can detect is 131 copies/mL. A negative result does not preclude SARS-Cov-2 infection and should not be used as the sole basis for treatment or other patient management decisions. A negative result may occur with  improper specimen collection/handling, submission of specimen other than nasopharyngeal swab, presence of viral mutation(s) within the areas targeted by this assay, and inadequate number of viral copies (<131 copies/mL). A negative result must be combined with clinical observations, patient history, and epidemiological information. The expected result is Negative. Fact Sheet for Patients:  https://www.moore.com/ Fact Sheet for Healthcare Providers:  https://www.young.biz/ This test is not yet ap proved or cleared by the [place] FDA and  has been authorized for detection and/or diagnosis of SARS-CoV-2 by FDA under an Emergency Use Authorization (EUA). This EUA will remain  in effect (meaning this test can be used) for the duration of the COVID-19 declaration under Section 564(b)(1) of the Act, 21 U.S.C. section 360bbb-3(b)(1), unless the authorization is terminated or revoked sooner.    Influenza A by PCR NEGATIVE NEGATIVE Final   Influenza B by PCR NEGATIVE NEGATIVE Final    Comment: (NOTE) The Xpert Xpress SARS-CoV-2/FLU/RSV assay is intended as an aid in  the diagnosis of influenza from Nasopharyngeal swab specimens and  should not be used as a sole basis for  treatment. Nasal washings and  aspirates are unacceptable for Xpert Xpress SARS-CoV-2/FLU/RSV  testing. Fact Sheet for Patients: https://www.moore.com/ Fact Sheet for Healthcare Providers: https://www.young.biz/ This test is not yet approved or cleared by the [place] FDA and  has been authorized for detection and/or diagnosis of SARS-CoV-2 by  FDA under an Emergency Use Authorization (EUA). This EUA will remain  in effect (meaning this test can be used) for the duration of the  Covid-19 declaration under Section 564(b)(1) of the Act, 21  U.S.C. section 360bbb-3(b)(1), unless the authorization is  terminated or revoked. Performed at [facility], [address], [place], [place] [address]          Radiology Studies: DG Ribs Unilateral W/Chest Right  Result Date: 12/11/2019 CLINICAL DATA:  Status post fall. EXAM: RIGHT RIBS AND CHEST - 3+ VIEW COMPARISON:  November 01, 2011 FINDINGS: No fracture or other bone lesions are  seen involving the ribs. Posterior dislocation of the right elbow is seen. There is no evidence of pneumothorax or pleural effusion. Both lungs are clear. A large, stable hiatal hernia is seen. Heart size and mediastinal contours are within normal limits. IMPRESSION: 1. No acute rib fracture. 2. Posterior dislocation of the right elbow. 3. Large, stable hiatal hernia. * Electronically Signed   By: Aram Candela M.D.   On: 12/11/2019 22:43   DG Knee Complete 4 Views Right  Result Date: 12/12/2019 CLINICAL DATA:  Status post fall. EXAM: RIGHT KNEE - COMPLETE 4+ VIEW COMPARISON:  None. FINDINGS: No evidence of fracture, dislocation, or joint effusion. There is marked severity narrowing of the medial and lateral tibiofemoral compartment spaces. Mild patellofemoral narrowing is also seen. Very mild lateral chondrocalcinosis is seen. Soft tissues are unremarkable. IMPRESSION: Marked severity degenerative changes without  evidence of acute osseous abnormality. Electronically Signed   By: Aram Candela M.D.   On: 12/12/2019 01:30   DG Humerus Right  Result Date: 12/11/2019 CLINICAL DATA:  Status post fall. EXAM: RIGHT HUMERUS - 2+ VIEW COMPARISON:  None. FINDINGS: Posterior dislocation of the right elbow is seen. No acute fracture deformities are identified. Soft tissues are unremarkable. IMPRESSION: Posterior dislocation of the right elbow. Electronically Signed   By: Aram Candela M.D.   On: 12/11/2019 22:46        Scheduled Meds: . [MAR Hold] mometasone-formoterol  2 puff Inhalation BID   Continuous Infusions:   LOS: 0 days   Time spent= 15 mins    Jason Hauge Joline Maxcy, MD [facility]  If 7PM-7AM, please contact night-coverage  12/12/2019, 8:48 AM

## 2019-12-12 NOTE — Progress Notes (Signed)
Ortho Trauma Progress Note  Reviewed imaging and case with PA Browning. 74 yo F w/ elbow dislocation. During sedation had bout of asystole. Will need anesthesia for reduction. Patient posted for later this AM in OR. Consult to follow this AM  Shona Needles, MD [facility] [phone] (office) [email or web address]

## 2019-12-12 NOTE — Progress Notes (Signed)
Patient arrived to 6N31, from PACU, AOx4, VSS, assessed dressing on right arm, with ace wrap, clean and dry, patient wearing sling. Denies pain, oriented to room, use of call bell, bed controls, phone placed within reach, informed about visitation policy. Will continue to monitor.

## 2019-12-12 NOTE — Consult Note (Signed)
Orthopaedic Trauma Service (OTS) Consult   Patient ID: [name] MRN: [id number] DOB/AGE: 11/09/1945 74 y.o.  Reason for Consult:Right elbow dislocation Referring Physician: Dr. Veatrice Kells, MD [facility]  HPI: [name] is an 74 y.o. female who is being seen in consultation at the request of Dr. Randal Buba for evaluation of right elbow dislocation.  The patient had a ground-level fall yesterday evening.  She twisted and fell and landed on her right side.  Was found to have a elbow dislocation.  She also notes some right knee pain and right pain on her chest.  The emergency room attempted a reduction unfortunately she went into a 6-second pause of possible asystole.  She had normal sinus rhythm afterwards.  However with that event the emergency room was concerned about performing conscious sedation.  As result they felt that she needed to be monitored by anesthesia.  Patient was seen in the preoperative holding area.  The patient states that she had a [place] sandwich at 530 this morning.  She states that she lives alone.  She is right-hand dominant.  She has a son that is around.  She also has some friends that are around that check in on her.  She denies any significant heart problems.  Denies any numbness or tingling in her right upper extremity.  Past Medical History:  Diagnosis Date  . COPD (chronic obstructive pulmonary disease) ([place])   . Glaucoma   . History of kidney disease   . History of kidney disease 06/11/2019  . History of recurrent UTIs   . Hyperlipidemia   . Hypertension   . Lichen sclerosus January 2017   vulva/perianal region  . Non-melanoma skin cancer 08/2015   right leg  . Osteoporosis   . Post-menopausal    . Prediabetes     Past Surgical History:  Procedure Laterality Date  . EYE SURGERY Right    Stent placement  . FOOT SURGERY Right 06/2013   right 2nd toe  . HEMATOMA EVACUATION Left 2007   left calf - excision of hemangioma with  thrombus    Family History  Problem Relation Age of Onset  . Breast cancer Mother        deceased -62's  . Heart attack Father   . Heart attack Brother     Social History:  reports that she has never smoked. She has never used smokeless tobacco. She reports previous alcohol use. She reports that she does not use drugs.  Allergies:  Allergies  Allergen Reactions  . Contrast Media [Iodinated Diagnostic Agents] Hives  . Ether Other (See Comments)    hallucinations  . Iohexol      Code: HIVES, Desc: pt had iv contrast for the first time today. doing a test dose w/ an miroi, 20cc contrast, pt immediately broke out in hives, itching, swelling of lips and tongue.  Check w/ rad concerning recommendations.50 mg of benedryl given po per dr Tery Sanfilippo., Onset Date: [id number]     Medications: No current facility-administered medications on file prior to encounter.   Current Outpatient Medications on File Prior to Encounter  Medication Sig Dispense Refill  . albuterol (PROAIR HFA) 108 (90 BASE) MCG/ACT inhaler Inhale 2 puffs into the lungs every 6 (six) hours as needed for wheezing or shortness of breath.    Marland Kitchen aspirin 81 MG tablet Take 81 mg by mouth daily.    Marland Kitchen aspirin-acetaminophen-caffeine (EXCEDRIN MIGRAINE) 250-250-65 MG tablet Take 1 tablet by mouth as needed for headache.    Marland Kitchen  atorvastatin (LIPITOR) 20 MG tablet Take 1 tablet by mouth at bedtime.  11  . budesonide-formoterol (SYMBICORT) 160-4.5 MCG/ACT inhaler Inhale 2 puffs into the lungs daily.     . calcium carbonate (OS-CAL) 600 MG TABS tablet Take 600 mg by mouth 2 (two) times daily with a meal.    . Cholecalciferol (VITAMIN D-3) 1000 UNITS CAPS Take 1,000 Units by mouth daily.     . Coenzyme Q10 (COQ-10 PO) Take 1 tablet by mouth daily.     . diclofenac sodium (VOLTAREN) 1 % GEL Apply 2 g topically 4 (four) times daily.     . famotidine (PEPCID) 20 MG tablet Take 1 tablet (20 mg total) by mouth 2 (two) times daily. 30 tablet 0  .  gabapentin (NEURONTIN) 100 MG capsule Take 1 capsule (100 mg total) by mouth at bedtime. 7 capsule 0  . latanoprost (XALATAN) 0.005 % ophthalmic solution Place 1 drop into the left eye at bedtime.     Marland Kitchen levocetirizine (XYZAL) 5 MG tablet Take 5 mg by mouth daily.    Marland Kitchen lidocaine (XYLOCAINE) 5 % ointment Apply 1 application topically 4 (four) times daily as needed. (Patient taking differently: Apply 1 application topically 4 (four) times daily as needed for mild pain. ) 30 g 0  . losartan (COZAAR) 100 MG tablet Take 100 mg by mouth daily.     . Misc Natural Products (FOCUSED MIND PO) Take 1 tablet by mouth daily.     . Multiple Vitamins-Minerals (ZINC PO) Take 1 tablet by mouth daily.     . polyethylene glycol (MIRALAX / GLYCOLAX) 17 g packet Take 17 g by mouth daily.     Marland Kitchen Ubrogepant (UBRELVY) 50 MG TABS Take 50 mg by mouth as needed (may repeat once in 2 hours. no more than 2 pills in 24 h.). 10 tablet 3  . vitamin C (ASCORBIC ACID) 250 MG tablet Take 250 mg by mouth daily.    . betamethasone valerate ointment (VALISONE) 0.1 % Apply a pea sized amount 3 x a week. Can increase to BID for up to 1-2 weeks as needed (Patient not taking: Reported on 12/11/2019) 30 g 0  . Cetirizine HCl 10 MG CAPS Take 1 capsule (10 mg total) by mouth daily for 10 days. (Patient not taking: Reported on 12/11/2019) 10 capsule 0    ROS: Constitutional: No fever or chills Vision: No changes in vision ENT: No difficulty swallowing CV: No chest pain Pulm: No SOB or wheezing GI: No nausea or vomiting GU: No urgency or inability to hold urine Skin: No poor wound healing Neurologic: No numbness or tingling Psychiatric: No depression or anxiety Heme: No bruising Allergic: No reaction to medications or food   Exam: Blood pressure (!) 142/58, pulse 78, temperature 97.9 F (36.6 C), temperature source Oral, resp. rate 18, height 5\' 7"  (1.702 m), weight 90 kg, last menstrual period 08/14/1985, SpO2 95 %. General: No acute  distress Orientation: Awake alert oriented x3 Mood and Affect: Cooperative and pleasant Gait: Unable to assess due to her injuries Coordination and balance: Within normal limits  Right upper extremity: Sling is in place.  Obvious deformity about the elbow.  Bruising and swelling throughout the upper extremity.  Compartments are soft compressible.  Active motor and sensory function to median, radial and ulnar nerve distribution.  Warm well-perfused hand with 2+ radial pulse.  No deformity about the wrist or shoulder.  No lymphadenopathy.  Reflexes were not assessed.  Left upper extremity: Skin without  lesions. No tenderness to palpation. Full painless ROM, full strength in each muscle groups without evidence of instability.  Right lower extremity: Bruising of the knee.  Pain with gentle range of motion.  Neurovascularly intact distally.  Compartments are soft compressible.  No obvious deformity about the knee or ankle.  Left lower extremity: Skin without lesions.  No tenderness palpation.  Full painless range of motion.  Full strength in each muscle groups no evidence of instability.   Medical Decision Making: Data: Imaging: X-rays of the right humerus show a posterior elbow dislocation.  No associated fracture that I can appreciate on the x-rays.  Labs:  Results for orders placed or performed during the hospital encounter of 12/11/19 (from the past 24 hour(s))  CBC with Differential     Status: Abnormal   Collection Time: 12/11/19 10:11 PM  Result Value Ref Range   WBC 4.0 4.0 - 10.5 K/uL   RBC 3.86 (L) 3.87 - 5.11 MIL/uL   Hemoglobin 11.1 (L) 12.0 - 15.0 g/dL   HCT 36.0 36.0 - 46.0 %   MCV 93.3 80.0 - 100.0 fL   MCH 28.8 26.0 - 34.0 pg   MCHC 30.8 30.0 - 36.0 g/dL   RDW 14.0 11.5 - 15.5 %   Platelets 186 150 - 400 K/uL   nRBC 0.0 0.0 - 0.2 %   Neutrophils Relative % 57 %   Neutro Abs 2.2 1.7 - 7.7 K/uL   Lymphocytes Relative 27 %   Lymphs Abs 1.1 0.7 - 4.0 K/uL   Monocytes  Relative 9 %   Monocytes Absolute 0.4 0.1 - 1.0 K/uL   Eosinophils Relative 6 %   Eosinophils Absolute 0.2 0.0 - 0.5 K/uL   Basophils Relative 1 %   Basophils Absolute 0.0 0.0 - 0.1 K/uL   Immature Granulocytes 0 %   Abs Immature Granulocytes 0.01 0.00 - 0.07 K/uL  Basic metabolic panel     Status: Abnormal   Collection Time: 12/11/19 10:11 PM  Result Value Ref Range   Sodium 141 135 - 145 mmol/L   Potassium 4.2 3.5 - 5.1 mmol/L   Chloride 105 98 - 111 mmol/L   CO2 26 22 - 32 mmol/L   Glucose, Bld 130 (H) 70 - 99 mg/dL   BUN 19 8 - 23 mg/dL   Creatinine, Ser 0.86 0.44 - 1.00 mg/dL   Calcium 9.5 8.9 - 10.3 mg/dL   GFR calc non Af Amer >60 >60 mL/min   GFR calc Af Amer >60 >60 mL/min   Anion gap 10 5 - 15  Respiratory Panel by RT PCR (Flu A&B, Covid) - Nasopharyngeal Swab     Status: None   Collection Time: 12/12/19 12:56 AM   Specimen: Nasopharyngeal Swab  Result Value Ref Range   SARS Coronavirus 2 by RT PCR NEGATIVE NEGATIVE   Influenza A by PCR NEGATIVE NEGATIVE   Influenza B by PCR NEGATIVE NEGATIVE  CBG monitoring, ED     Status: Abnormal   Collection Time: 12/12/19  3:44 AM  Result Value Ref Range   Glucose-Capillary 112 (H) 70 - 99 mg/dL   Comment 1 Notify RN    Comment 2 Document in Chart   Troponin I (High Sensitivity)     Status: None   Collection Time: 12/12/19  4:23 AM  Result Value Ref Range   Troponin I (High Sensitivity) 4 <18 ng/L  Magnesium     Status: None   Collection Time: 12/12/19  4:23 AM  Result Value  Ref Range   Magnesium 1.9 1.7 - 2.4 mg/dL  TSH     Status: None   Collection Time: 12/12/19  4:23 AM  Result Value Ref Range   TSH 0.949 0.350 - 4.500 uIU/mL    Imaging or Labs ordered: None  Medical history and chart was reviewed and case discussed with medical provider.  Assessment/Plan: 74 year old female right-hand-dominant with a ground-level fall with a right elbow dislocation.  Patient was unable to be reduced in the emergency room.   She will require formal closed reduction of her right elbow and operating room.  Unfortunately she ate a significant amount of food and we were unable to perform this until it is safe to proceed from an anesthesia standpoint.  Risks and benefits were discussed with the patient.  Risks include but not limited to recurrent dislocation, elbow stiffness, possible fracture, nerve or blood vessel injury, even the possibility anesthetic complications.  The patient agreed to proceed with surgery and consent was obtained.  Patient will likely be immobilized for approximately 1 week postoperatively and allow for some gentle range of motion after that.  Shona Needles, MD [facility] [phone] (office) [email or web address]

## 2019-12-12 NOTE — ED Provider Notes (Signed)
[name]   CSN: [id number] Arrival date & time: 12/11/19  2150     History Chief Complaint  Patient presents with  . Fall    [name] is a 74 y.o. female.  Patient presents to the emergency department with a chief complaint of fall.  She states that she was out watering her flowers when she stumbled flowerpot and fell landing on her right side.  She hit her right elbow, and states that her arm twisted behind her.  She also complains of right side pain and right knee pain.  She denies hitting her head or passing out.  She states that she has chronic headaches, and reports that she does have a headache now, but denies any new changes with regard to her headache.  Denies any treatments prior to arrival.  She states that the majority of the pain is in her right elbow.  She is unable to move it without significant pain.  She has been able to ambulate, but complains of pain in her right knee.  This is worsened with palpation and movement.  She denies any other associated symptoms.  The history is provided by the patient. No language interpreter was used.       Past Medical History:  Diagnosis Date  . COPD (chronic obstructive pulmonary disease) ([place])   . Glaucoma   . History of kidney disease   . History of kidney disease 06/11/2019  . History of recurrent UTIs   . Hyperlipidemia   . Hypertension   . Lichen sclerosus January 2017   vulva/perianal region  . Non-melanoma skin cancer 08/2015   right leg  . Osteoporosis   . Post-menopausal    . Prediabetes     Patient Active Problem List   Diagnosis Date Noted  . History of kidney disease 06/11/2019  . Prediabetes   . Status post foot surgery 06/27/2013  . Other hammer toe (acquired) 06/10/2013  . Pain in foot 06/10/2013  . Onychomycosis 06/10/2013    Past Surgical History:  Procedure Laterality Date  . EYE SURGERY Right    Stent placement  . FOOT SURGERY  Right 06/2013   right 2nd toe  . HEMATOMA EVACUATION Left 2007   left calf - excision of hemangioma with thrombus     OB History    Gravida  6   Para  1   Term  1   Preterm  0   AB  5   Living  1     SAB  5   TAB  0   Ectopic  0   Multiple  0   Live Births  1           Family History  Problem Relation Age of Onset  . Breast cancer Mother        deceased -28's  . Heart attack Father   . Heart attack Brother     Social History   Tobacco Use  . Smoking status: Never Smoker  . Smokeless tobacco: Never Used  Substance Use Topics  . Alcohol use: Not Currently  . Drug use: No    Home Medications Prior to Admission medications   Medication Sig Start Date End Date Taking? Authorizing Provider  albuterol (PROAIR HFA) 108 (90 BASE) MCG/ACT inhaler Inhale 2 puffs into the lungs every 6 (six) hours as needed for wheezing or shortness of breath.   Yes [provider]  aspirin 81 MG tablet Take  81 mg by mouth daily.   Yes [provider]  aspirin-acetaminophen-caffeine (EXCEDRIN MIGRAINE) [phone] MG tablet Take 1 tablet by mouth as needed for headache.   Yes [provider]  atorvastatin (LIPITOR) 20 MG tablet Take 1 tablet by mouth at bedtime. 06/26/15  Yes [provider]  budesonide-formoterol (SYMBICORT) 160-4.5 MCG/ACT inhaler Inhale 2 puffs into the lungs daily.    Yes [provider]  calcium carbonate (OS-CAL) 600 MG TABS tablet Take 600 mg by mouth 2 (two) times daily with a meal.   Yes [provider]  Cholecalciferol (VITAMIN D-3) 1000 UNITS CAPS Take 1,000 Units by mouth daily.    Yes [provider]  Coenzyme Q10 (COQ-10 PO) Take 1 tablet by mouth daily.    Yes [provider]  diclofenac sodium (VOLTAREN) 1 % GEL Apply 2 g topically 4 (four) times daily.    Yes [provider]  famotidine (PEPCID) 20 MG tablet Take 1 tablet (20 mg total) by mouth 2 (two) times daily.  01/25/19  Yes Wieters, Hallie C, PA-C  gabapentin (NEURONTIN) 100 MG capsule Take 1 capsule (100 mg total) by mouth at bedtime. 12/04/19  Yes Star Age, MD  latanoprost (XALATAN) 0.005 % ophthalmic solution Place 1 drop into the left eye at bedtime.    Yes [provider]  levocetirizine (XYZAL) 5 MG tablet Take 5 mg by mouth daily. 12/01/19  Yes [provider]  lidocaine (XYLOCAINE) 5 % ointment Apply 1 application topically 4 (four) times daily as needed. Patient taking differently: Apply 1 application topically 4 (four) times daily as needed for mild pain.  08/25/19  Yes Salvadore Dom, MD  losartan (COZAAR) 100 MG tablet Take 100 mg by mouth daily.  10/03/17  Yes [provider]  Misc Natural Products (FOCUSED MIND PO) Take 1 tablet by mouth daily.    Yes [provider]  Multiple Vitamins-Minerals (ZINC PO) Take 1 tablet by mouth daily.    Yes [provider]  polyethylene glycol (MIRALAX / GLYCOLAX) 17 g packet Take 17 g by mouth daily.    Yes [provider]  Ubrogepant (UBRELVY) 50 MG TABS Take 50 mg by mouth as needed (may repeat once in 2 hours. no more than 2 pills in 24 h.). 11/26/19  Yes Star Age, MD  vitamin C (ASCORBIC ACID) 250 MG tablet Take 250 mg by mouth daily.   Yes [provider]  betamethasone valerate ointment (VALISONE) 0.1 % Apply a pea sized amount 3 x a week. Can increase to BID for up to 1-2 weeks as needed Patient not taking: Reported on 12/11/2019 08/25/19   Salvadore Dom, MD  Cetirizine HCl 10 MG CAPS Take 1 capsule (10 mg total) by mouth daily for 10 days. Patient not taking: Reported on 12/11/2019 01/25/19 12/10/28  Wieters, Madelynn Done C, PA-C    Allergies    Contrast media [iodinated diagnostic agents], Ether, and Iohexol  Review of Systems   Review of Systems  All other systems reviewed and are negative.   Physical Exam Updated Vital Signs BP (!) 166/80   Pulse 64   Temp 97.9 F  (36.6 C) (Oral)   Resp 16   Ht 5\' 7"  (1.702 m)   Wt 90 kg   LMP 08/14/1985 (Approximate)   SpO2 97%   BMI 31.08 kg/m   Physical Exam Vitals and nursing [name] reviewed.  Constitutional:      General: She is not in acute distress.  Appearance: She is well-developed.  HENT:     Head: Normocephalic and atraumatic.  Eyes:     Conjunctiva/sclera: Conjunctivae normal.  Cardiovascular:     Rate and Rhythm: Normal rate and regular rhythm.     Heart sounds: No murmur.  Pulmonary:     Effort: Pulmonary effort is normal. No respiratory distress.     Breath sounds: Normal breath sounds.  Abdominal:     Palpations: Abdomen is soft.     Tenderness: There is no abdominal tenderness.  Musculoskeletal:     Cervical back: Neck supple.     Comments: Range of motion of right elbow is reduced secondary to pain, there is bony deformity noted on exam, no breakdown of the skin There is a small contusion and abrasion to right anterior knee with some tenderness to palpation deformity  Skin:    General: Skin is warm and dry.  Neurological:     Mental Status: She is alert and oriented to person, place, and time.  Psychiatric:        Mood and Affect: Mood normal.        Behavior: Behavior normal.     ED Results / Procedures / Treatments   Labs (all labs ordered are listed, but only abnormal results are displayed) Labs Reviewed  CBC WITH DIFFERENTIAL/PLATELET - Abnormal; Notable for the following components:      Result Value   RBC 3.86 (*)    Hemoglobin 11.1 (*)    All other components within normal limits  BASIC METABOLIC PANEL - Abnormal; Notable for the following components:   Glucose, Bld 130 (*)    All other components within normal limits    EKG EKG Interpretation  Date/Time:  Friday December 12 2019 00:50:28 EDT Ventricular Rate:  62 PR Interval:    QRS Duration: 102 QT Interval:  417 QTC Calculation: 424 R Axis:   -16 Text Interpretation: Sinus rhythm Left ventricular  hypertrophy Confirmed by Dory Horn) on 12/12/2019 2:26:16 AM       Radiology DG Ribs Unilateral W/Chest Right  Result Date: 12/11/2019 CLINICAL DATA:  Status post fall. EXAM: RIGHT RIBS AND CHEST - 3+ VIEW COMPARISON:  November 01, 2011 FINDINGS: No fracture or other bone lesions are seen involving the ribs. Posterior dislocation of the right elbow is seen. There is no evidence of pneumothorax or pleural effusion. Both lungs are clear. A large, stable hiatal hernia is seen. Heart size and mediastinal contours are within normal limits. IMPRESSION: 1. No acute rib fracture. 2. Posterior dislocation of the right elbow. 3. Large, stable hiatal hernia. * Electronically Signed   By: Virgina Norfolk M.D.   On: 12/11/2019 22:43   DG Humerus Right  Result Date: 12/11/2019 CLINICAL DATA:  Status post fall. EXAM: RIGHT HUMERUS - 2+ VIEW COMPARISON:  None. FINDINGS: Posterior dislocation of the right elbow is seen. No acute fracture deformities are identified. Soft tissues are unremarkable. IMPRESSION: Posterior dislocation of the right elbow. Electronically Signed   By: Virgina Norfolk M.D.   On: 12/11/2019 22:46    Procedures .Critical Care Performed by: Montine Circle, PA-C Authorized by: Montine Circle, PA-C   Critical care provider statement:    Critical care time (minutes):  35   Critical care was necessary to treat or prevent imminent or life-threatening deterioration of the following conditions:  Circulatory failure   Critical care was time spent personally by me on the following activities:  Discussions with consultants, evaluation of patient's response to treatment, examination  of patient, ordering and performing treatments and interventions, ordering and review of laboratory studies, ordering and review of radiographic studies, pulse oximetry, re-evaluation of patient's condition, obtaining history from patient or surrogate and review of old charts Reduction of dislocation   Date/Time: 12/12/2019 2:22 AM Performed by: Montine Circle, PA-C Authorized by: Montine Circle, PA-C  Consent: The procedure was performed in an emergent situation. Verbal consent obtained. Risks and benefits: risks, benefits and alternatives were discussed Consent given by: patient Patient understanding: patient states understanding of the procedure being performed Patient consent: the patient's understanding of the procedure matches consent given Procedure consent: procedure consent matches procedure scheduled Relevant documents: relevant documents present and verified Test results: test results available and properly labeled Site marked: the operative site was marked Imaging studies: imaging studies available Required items: required blood products, implants, devices, and special equipment available Patient identity confirmed: verbally with patient Time out: Immediately prior to procedure a "time out" was called to verify the correct patient, procedure, equipment, support staff and site/side marked as required. Preparation: Patient was prepped and draped in the usual sterile fashion. Local anesthesia used: no (No local anesthesia was given, fentanyl was given parentally for pain)  Anesthesia: Local anesthesia used: no (No local anesthesia was given, fentanyl was given parentally for pain)  Sedation: Patient sedated: no (No sedation was performed due to age and risk)  Patient tolerance of procedure: Unsuccessful, terminated procedure. Comments: Patient had brief run of asystole during reduction attempt.  The procedure was terminated.    (including critical care time)  Medications Ordered in ED Medications  ketorolac (TORADOL) 30 MG/ML injection 30 mg (30 mg Intravenous Given 12/12/19 0025)  fentaNYL (SUBLIMAZE) injection 100 mcg (100 mcg Intravenous Given 12/12/19 0056)    ED Course  I have reviewed the triage vital signs and the nursing notes.  Pertinent labs & imaging  results that were available during my care of the patient were reviewed by me and considered in my medical decision making (see chart for details).    MDM Rules/Calculators/A&P                      This patient complains of fall and right arm pain, this involves an extensive number of treatment options, and is a complaint that carries with it a high risk of complications and morbidity.  The differential diagnosis includes fracture, dislocation, other traumatic injuries.  I ordered, reviewed, and interpreted labs, which included CBC notable for no leukocytosis, hemoglobin is 11.1.  Basic metabolic panel notable for glucose of 130, but no other significant electrolyte derangement.  Covid test is pending. I ordered medication Toradol and fentanyl for pain control I ordered imaging studies which included chest x-ray with ribs and right humerus and right knee and I independently visualized and interpreted imaging which showed right posterior elbow dislocation no rib fractures, severe degenerative changes in the right knee. Previous records obtained and reviewed history of migraine headaches, she does complain of headache today, but no different from her regular headaches and no traumatic head injury.  I consulted Dr. Fredna Dow from hand surgery, who states that elbow dislocations are now managed by general orthopedics.    I discussed the case with Dr. Doreatha Martin from orthopedics, and discussed our concern about the patient's episode of asystole during attempted reduction in the emergency department.  Plan is for definitive reduction in the OR in the morning.  Dr. Doreatha Martin requests medicine admission.   I discussed the case with Dr.  Hal Hope, who will admit the patient.  Appreciate his help.    I also discussed the case with Dr. Hassell Done from cardiology regarding the patient's brief asystole.  Dr. Hassell Done believes this may have been from vasovagaling during reduction efforts and recommends continuing to monitor  the patient on heart monitor and consult with cardiology if any further episodes occur.  After the interventions stated above, I reevaluated the patient and found still having pain from her right elbow dislocation.  Additional pain medication ordered.  Final Clinical Impression(s) / ED Diagnoses Final diagnoses:  Dislocation of right elbow, initial encounter  Fall, initial encounter    Rx / DC Orders ED Discharge Orders    None       Montine Circle, PA-C 12/12/19 0230    Palumbo, April, MD 12/12/19 [phone]

## 2019-12-12 NOTE — Discharge Instructions (Signed)
[facility] Discharge Instructions   General Discharge Instructions  WEIGHT BEARING STATUS: Non-weightbearing right upper extremity  RANGE OF MOTION/ACTIVITY: Do not remove splint  DVT/PE prophylaxis: None  Diet: as you were eating previously.  Can use over the counter stool softeners and bowel preparations, such as Miralax, to help with bowel movements.  Narcotics can be constipating.  Be sure to drink plenty of fluids  PAIN MEDICATION USE AND EXPECTATIONS  You have likely been given narcotic medications to help control your pain.  After a traumatic event that results in an fracture (broken bone) with or without surgery, it is ok to use narcotic pain medications to help control one's pain.  We understand that everyone responds to pain differently and each individual patient will be evaluated on a regular basis for the continued need for narcotic medications. Ideally, narcotic medication use should last no more than 6-8 weeks (coinciding with fracture healing).   As a patient it is your responsibility as well to monitor narcotic medication use and report the amount and frequency you use these medications when you come to your office visit.   We would also advise that if you are using narcotic medications, you should take a dose prior to therapy to maximize you participation.  IF YOU ARE ON NARCOTIC MEDICATIONS IT IS NOT PERMISSIBLE TO OPERATE A MOTOR VEHICLE (MOTORCYCLE/CAR/TRUCK/MOPED) OR HEAVY MACHINERY DO NOT MIX NARCOTICS WITH OTHER CNS (CENTRAL NERVOUS SYSTEM) DEPRESSANTS SUCH AS ALCOHOL   STOP SMOKING OR USING NICOTINE PRODUCTS!!!!  As discussed nicotine severely impairs your body's ability to heal surgical and traumatic wounds but also impairs bone healing.  Wounds and bone heal by forming microscopic blood vessels (angiogenesis) and nicotine is a vasoconstrictor (essentially, shrinks blood vessels).  Therefore, if vasoconstriction occurs to these microscopic blood  vessels they essentially disappear and are unable to deliver necessary nutrients to the healing tissue.  This is one modifiable factor that you can do to dramatically increase your chances of healing your injury.    (This means no smoking, no nicotine gum, patches, etc)  DO NOT USE NONSTEROIDAL ANTI-INFLAMMATORY DRUGS (NSAID'S)  Using products such as Advil (ibuprofen), Aleve (naproxen), Motrin (ibuprofen) for additional pain control during fracture healing can delay and/or prevent the healing response.  If you would like to take over the counter (OTC) medication, Tylenol (acetaminophen) is ok.  However, some narcotic medications that are given for pain control contain acetaminophen as well. Therefore, you should not exceed more than 4000 mg of tylenol in a day if you do not have liver disease.  Also note that there are may OTC medicines, such as cold medicines and allergy medicines that my contain tylenol as well.  If you have any questions about medications and/or interactions please ask your doctor/PA or your pharmacist.      ICE AND ELEVATE INJURED/OPERATIVE EXTREMITY  Using ice and elevating the injured extremity above your heart can help with swelling and pain control.  Icing in a pulsatile fashion, such as 20 minutes on and 20 minutes off, can be followed.    Do not place ice directly on skin. Make sure there is a barrier between to skin and the ice pack.    Using frozen items such as frozen peas works well as the conform nicely to the are that needs to be iced.  USE AN ACE WRAP OR TED HOSE FOR SWELLING CONTROL  In addition to icing and elevation, Ace wraps or TED hose are used to help  limit and resolve swelling.  It is recommended to use Ace wraps or TED hose until you are informed to stop.    When using Ace Wraps start the wrapping distally (farthest away from the body) and wrap proximally (closer to the body)   Example: If you had surgery on your leg or thing and you do not have a splint on,  start the ace wrap at the toes and work your way up to the thigh        If you had surgery on your upper extremity and do not have a splint on, start the ace wrap at your fingers and work your way up to the upper arm  IF YOU ARE IN A SPLINT OR CAST DO NOT [place]   If your splint gets wet for any reason please contact the office immediately. You may shower in your splint or cast as long as you keep it dry.  This can be done by wrapping in a cast cover or garbage back (or similar)  Do Not stick any thing down your splint or cast such as pencils, money, or hangers to try and scratch yourself with.  If you feel itchy take benadryl as prescribed on the bottle for itching   CALL THE OFFICE WITH ANY QUESTIONS OR CONCERNS: [phone]   VISIT OUR WEBSITE FOR ADDITIONAL INFORMATION: [email or web address]

## 2019-12-12 NOTE — H&P (Signed)
History and Physical    [name] [id number] DOB: Jan 07, 1946 DOA: 12/11/2019  PCP: Jilda Panda, MD  Patient coming from: Home.  Chief Complaint: Fall.  HPI: [name] is a 74 y.o. female with history of COPD, hypertension, hyperlipidemia, migraine, prediabetes had a fall at home when patient tripped on 2 of the base of her swing.  Did not hit her head.  Fell onto her right shoulder area.  Hurt her right elbow.  Denies any chest pain or shortness of breath.  ED Course: In the ER x-rays revealed right elbow dislocation.  ER physician was attempting to get the right elbow reduced when patient had a brief episode of asystole like featuring the telemetry monitor.  Which lasted for less than 3 seconds.  Cardiology at this time recommended monitoring in telemetry and since it is less than 3 seconds test monitoring is already recommended.  Dr. Doreatha Martin of orthopedics admitting patient surgery for the right elbow dislocation.  Labs show hemoglobin 11.1 otherwise unremarkable.  Covid test was negative.  Review of Systems: As per HPI, rest all negative.   Past Medical History:  Diagnosis Date  . COPD (chronic obstructive pulmonary disease) ([place])   . Glaucoma   . History of kidney disease   . History of kidney disease 06/11/2019  . History of recurrent UTIs   . Hyperlipidemia   . Hypertension   . Lichen sclerosus January 2017   vulva/perianal region  . Non-melanoma skin cancer 08/2015   right leg  . Osteoporosis   . Post-menopausal    . Prediabetes     Past Surgical History:  Procedure Laterality Date  . EYE SURGERY Right    Stent placement  . FOOT SURGERY Right 06/2013   right 2nd toe  . HEMATOMA EVACUATION Left 2007   left calf - excision of hemangioma with thrombus     reports that she has never smoked. She has never used smokeless tobacco. She reports previous alcohol use. She reports that she does not use drugs.  Allergies  Allergen Reactions  .  Contrast Media [Iodinated Diagnostic Agents] Hives  . Ether Other (See Comments)    hallucinations  . Iohexol      Code: HIVES, Desc: pt had iv contrast for the first time today. doing a test dose w/ an miroi, 20cc contrast, pt immediately broke out in hives, itching, swelling of lips and tongue.  Check w/ rad concerning recommendations.50 mg of benedryl given po per dr Tery Sanfilippo., Onset Date: [id number]     Family History  Problem Relation Age of Onset  . Breast cancer Mother        deceased -29's  . Heart attack Father   . Heart attack Brother     Prior to Admission medications   Medication Sig Start Date End Date Taking? Authorizing Provider  albuterol (PROAIR HFA) 108 (90 BASE) MCG/ACT inhaler Inhale 2 puffs into the lungs every 6 (six) hours as needed for wheezing or shortness of breath.   Yes [provider]  aspirin 81 MG tablet Take 81 mg by mouth daily.   Yes [provider]  aspirin-acetaminophen-caffeine (EXCEDRIN MIGRAINE) [phone] MG tablet Take 1 tablet by mouth as needed for headache.   Yes [provider]  atorvastatin (LIPITOR) 20 MG tablet Take 1 tablet by mouth at bedtime. 06/26/15  Yes [provider]  budesonide-formoterol (SYMBICORT) 160-4.5 MCG/ACT inhaler Inhale 2 puffs into the lungs daily.    Yes [provider]  calcium  carbonate (OS-CAL) 600 MG TABS tablet Take 600 mg by mouth 2 (two) times daily with a meal.   Yes [provider]  Cholecalciferol (VITAMIN D-3) 1000 UNITS CAPS Take 1,000 Units by mouth daily.    Yes [provider]  Coenzyme Q10 (COQ-10 PO) Take 1 tablet by mouth daily.    Yes [provider]  diclofenac sodium (VOLTAREN) 1 % GEL Apply 2 g topically 4 (four) times daily.    Yes [provider]  famotidine (PEPCID) 20 MG tablet Take 1 tablet (20 mg total) by mouth 2 (two) times daily. 01/25/19  Yes Wieters, Hallie C, PA-C  gabapentin (NEURONTIN) 100 MG capsule Take 1  capsule (100 mg total) by mouth at bedtime. 12/04/19  Yes Star Age, MD  latanoprost (XALATAN) 0.005 % ophthalmic solution Place 1 drop into the left eye at bedtime.    Yes [provider]  levocetirizine (XYZAL) 5 MG tablet Take 5 mg by mouth daily. 12/01/19  Yes [provider]  lidocaine (XYLOCAINE) 5 % ointment Apply 1 application topically 4 (four) times daily as needed. Patient taking differently: Apply 1 application topically 4 (four) times daily as needed for mild pain.  08/25/19  Yes Salvadore Dom, MD  losartan (COZAAR) 100 MG tablet Take 100 mg by mouth daily.  10/03/17  Yes [provider]  Misc Natural Products (FOCUSED MIND PO) Take 1 tablet by mouth daily.    Yes [provider]  Multiple Vitamins-Minerals (ZINC PO) Take 1 tablet by mouth daily.    Yes [provider]  polyethylene glycol (MIRALAX / GLYCOLAX) 17 g packet Take 17 g by mouth daily.    Yes [provider]  Ubrogepant (UBRELVY) 50 MG TABS Take 50 mg by mouth as needed (may repeat once in 2 hours. no more than 2 pills in 24 h.). 11/26/19  Yes Star Age, MD  vitamin C (ASCORBIC ACID) 250 MG tablet Take 250 mg by mouth daily.   Yes [provider]  betamethasone valerate ointment (VALISONE) 0.1 % Apply a pea sized amount 3 x a week. Can increase to BID for up to 1-2 weeks as needed Patient not taking: Reported on 12/11/2019 08/25/19   Salvadore Dom, MD  Cetirizine HCl 10 MG CAPS Take 1 capsule (10 mg total) by mouth daily for 10 days. Patient not taking: Reported on 12/11/2019 01/25/19 12/10/28  Janith Lima, PA-C    Physical Exam: Constitutional: Moderately built and nourished. Vitals:   12/12/19 0130 12/12/19 0145 12/12/19 0300 12/12/19 0330  BP: (!) 157/89 (!) 149/104 (!) 162/81 (!) 156/81  Pulse: 69 66 79 79  Resp: 18 (!) 23 14 20   Temp:      TempSrc:      SpO2: 95% 96% 96% 96%  Weight:      Height:       Eyes: Anicteric no  pallor. ENMT: No discharge from the ears eyes nose or mouth. Neck: No mass felt.  No neck rigidity. Respiratory: No rhonchi or crepitations. Cardiovascular: S1-S2 heard. Abdomen: Soft nontender bowel sounds present. Musculoskeletal: Right elbow is dislocated.  On sling. Skin: No rash. Neurologic: Alert awake oriented to time place and person.  Moves all extremities. Psychiatric: Appears normal but normal affect.   Labs on Admission: I have personally reviewed following labs and imaging studies  CBC: Recent Labs  Lab 12/11/19 2211  WBC 4.0  NEUTROABS 2.2  HGB 11.1*  HCT 36.0  MCV 93.3  PLT 186  Basic Metabolic Panel: Recent Labs  Lab 12/11/19 2211  NA 141  K 4.2  CL 105  CO2 26  GLUCOSE 130*  BUN 19  CREATININE 0.86  CALCIUM 9.5   GFR: Estimated Creatinine Clearance: 65.1 mL/min (by C-G formula based on SCr of 0.86 mg/dL). Liver Function Tests: No results for input(s): AST, ALT, ALKPHOS, BILITOT, PROT, ALBUMIN in the last 168 hours. No results for input(s): LIPASE, AMYLASE in the last 168 hours. No results for input(s): AMMONIA in the last 168 hours. Coagulation Profile: No results for input(s): INR, PROTIME in the last 168 hours. Cardiac Enzymes: No results for input(s): CKTOTAL, CKMB, CKMBINDEX, TROPONINI in the last 168 hours. BNP (last 3 results) No results for input(s): PROBNP in the last 8760 hours. HbA1C: No results for input(s): HGBA1C in the last 72 hours. CBG: No results for input(s): GLUCAP in the last 168 hours. Lipid Profile: No results for input(s): CHOL, HDL, LDLCALC, TRIG, CHOLHDL, LDLDIRECT in the last 72 hours. Thyroid Function Tests: No results for input(s): TSH, T4TOTAL, FREET4, T3FREE, THYROIDAB in the last 72 hours. Anemia Panel: No results for input(s): VITAMINB12, FOLATE, FERRITIN, TIBC, IRON, RETICCTPCT in the last 72 hours. Urine analysis:    Component Value Date/Time   COLORURINE YELLOW 03/08/2008 1635   APPEARANCEUR CLEAR  03/08/2008 1635   LABSPEC >=1.030 12/20/2013 1313   PHURINE 5.5 12/20/2013 1313   GLUCOSEU NEGATIVE 12/20/2013 1313   HGBUR LARGE (A) 12/20/2013 1313   BILIRUBINUR N 12/27/2018 1427   KETONESUR TRACE (A) 12/20/2013 1313   PROTEINUR Negative 12/27/2018 1427   PROTEINUR >=300 (A) 12/20/2013 1313   UROBILINOGEN 0.2 12/27/2018 1427   UROBILINOGEN 1.0 12/20/2013 1313   NITRITE N 12/27/2018 1427   NITRITE POSITIVE (A) 12/20/2013 1313   LEUKOCYTESUR Negative 12/27/2018 1427   Sepsis Labs: @LABRCNTIP (procalcitonin:4,lacticidven:4) ) Recent Results (from the past 240 hour(s))  Respiratory Panel by RT PCR (Flu A&B, Covid) - Nasopharyngeal Swab     Status: None   Collection Time: 12/12/19 12:56 AM   Specimen: Nasopharyngeal Swab  Result Value Ref Range Status   SARS Coronavirus 2 by RT PCR NEGATIVE NEGATIVE Final    Comment: (NOTE) SARS-CoV-2 target nucleic acids are NOT DETECTED. The SARS-CoV-2 RNA is generally detectable in upper respiratoy specimens during the acute phase of infection. The lowest concentration of SARS-CoV-2 viral copies this assay can detect is 131 copies/mL. A negative result does not preclude SARS-Cov-2 infection and should not be used as the sole basis for treatment or other patient management decisions. A negative result may occur with  improper specimen collection/handling, submission of specimen other than nasopharyngeal swab, presence of viral mutation(s) within the areas targeted by this assay, and inadequate number of viral copies (<131 copies/mL). A negative result must be combined with clinical observations, patient history, and epidemiological information. The expected result is Negative. Fact Sheet for Patients:  PinkCheek.be Fact Sheet for Healthcare Providers:  GravelBags.it This test is not yet ap proved or cleared by the [place] FDA and  has been authorized for detection and/or  diagnosis of SARS-CoV-2 by FDA under an Emergency Use Authorization (EUA). This EUA will remain  in effect (meaning this test can be used) for the duration of the COVID-19 declaration under Section 564(b)(1) of the Act, 21 U.S.C. section 360bbb-3(b)(1), unless the authorization is terminated or revoked sooner.    Influenza A by PCR NEGATIVE NEGATIVE Final   Influenza B by PCR NEGATIVE NEGATIVE Final    Comment: (NOTE) The Xpert Xpress  SARS-CoV-2/FLU/RSV assay is intended as an aid in  the diagnosis of influenza from Nasopharyngeal swab specimens and  should not be used as a sole basis for treatment. Nasal washings and  aspirates are unacceptable for Xpert Xpress SARS-CoV-2/FLU/RSV  testing. Fact Sheet for Patients: PinkCheek.be Fact Sheet for Healthcare Providers: GravelBags.it This test is not yet approved or cleared by the [place] FDA and  has been authorized for detection and/or diagnosis of SARS-CoV-2 by  FDA under an Emergency Use Authorization (EUA). This EUA will remain  in effect (meaning this test can be used) for the duration of the  Covid-19 declaration under Section 564(b)(1) of the Act, 21  U.S.C. section 360bbb-3(b)(1), unless the authorization is  terminated or revoked. Performed at [facility], [place] [address], [place], [place] [address]      Radiological Exams on Admission: DG Ribs Unilateral W/Chest Right  Result Date: 12/11/2019 CLINICAL DATA:  Status post fall. EXAM: RIGHT RIBS AND CHEST - 3+ VIEW COMPARISON:  November 01, 2011 FINDINGS: No fracture or other bone lesions are seen involving the ribs. Posterior dislocation of the right elbow is seen. There is no evidence of pneumothorax or pleural effusion. Both lungs are clear. A large, stable hiatal hernia is seen. Heart size and mediastinal contours are within normal limits. IMPRESSION: 1. No acute rib fracture. 2. Posterior dislocation of the  right elbow. 3. Large, stable hiatal hernia. * Electronically Signed   By: Virgina Norfolk M.D.   On: 12/11/2019 22:43   DG Knee Complete 4 Views Right  Result Date: 12/12/2019 CLINICAL DATA:  Status post fall. EXAM: RIGHT KNEE - COMPLETE 4+ VIEW COMPARISON:  None. FINDINGS: No evidence of fracture, dislocation, or joint effusion. There is marked severity narrowing of the medial and lateral tibiofemoral compartment spaces. Mild patellofemoral narrowing is also seen. Very mild lateral chondrocalcinosis is seen. Soft tissues are unremarkable. IMPRESSION: Marked severity degenerative changes without evidence of acute osseous abnormality. Electronically Signed   By: Virgina Norfolk M.D.   On: 12/12/2019 01:30   DG Humerus Right  Result Date: 12/11/2019 CLINICAL DATA:  Status post fall. EXAM: RIGHT HUMERUS - 2+ VIEW COMPARISON:  None. FINDINGS: Posterior dislocation of the right elbow is seen. No acute fracture deformities are identified. Soft tissues are unremarkable. IMPRESSION: Posterior dislocation of the right elbow. Electronically Signed   By: Virgina Norfolk M.D.   On: 12/11/2019 22:46    EKG: Independently reviewed.  Normal sinus rhythm with QTC of 424 ms.  Assessment/Plan Principal Problem:   Vaso vagal episode Active Problems:   Prediabetes   History of kidney disease   Dislocated elbow, right, initial encounter   Essential hypertension    1. Vasovagal episode when patient had transiently had asystolic features on the telemetry monitor cardiology recommended monitoring.  Will check 2D echo cardiac markers magnesium follow metabolic panel. 2. Right elbow dislocation Dr. Doreatha Martin orthopedics is planning to take patient to the OR later.  We will keep patient n.p.o. 3. Hypertension we will keep patient n.p.o. and have hydralazine the patient is n.p.o. 4. COPD not actively wheezing. 5. History of migraine.  Denies any headache. 6. Prediabetes follow metabolic panel.   DVT  prophylaxis: SCDs for now since patient is going to the OR for surgery for the right elbow dislocation will avoid anticoagulation. Code Status: Full code. Family Communication: Discussed with patient. Disposition Plan: Home. Consults called: Orthopedics. Admission status: Observation.   Rise Patience MD [facility] Pager [phone].  If 7PM-7AM, please contact night-coverage [email or web address] Password [email or web address]  12/12/2019, 3:41 AM

## 2019-12-12 NOTE — Anesthesia Preprocedure Evaluation (Addendum)
Anesthesia Evaluation  Patient identified by MRN, date of birth, ID band Patient awake    Reviewed: Allergy & Precautions, NPO status , Patient's Chart, lab work & pertinent test results  Airway Mallampati: II  TM Distance: >3 FB Neck ROM: Full    Dental no notable dental hx. (+) Teeth Intact, Dental Advisory Given   Pulmonary COPD,    Pulmonary exam normal breath sounds clear to auscultation       Cardiovascular hypertension, Pt. on medications Normal cardiovascular exam Rhythm:Regular Rate:Normal  Last echo 2009: - The left ventricle was not well visualized.  - Left ventricular size was normal.  - Overall left ventricular systolic function was normal.  - Left ventricular ejection fraction was estimated , range being 55% to 65 %.  - This study was inadequate for the evaluation of left ventricularregional wall motion.  - Left ventricular wall thickness was mildly increased.  - There was mild focal basal septal hypertrophy.     Neuro/Psych negative neurological ROS  negative psych ROS   GI/Hepatic negative GI ROS, Neg liver ROS,   Endo/Other  Prediabetic Obesity BMI 31  Renal/GU negative Renal ROS  negative genitourinary   Musculoskeletal Right elbow dislocation   Abdominal Normal abdominal exam  (+)   Peds  Hematology negative hematology ROS (+)   Anesthesia Other Findings   Reproductive/Obstetrics negative OB ROS                            Anesthesia Physical Anesthesia Plan  ASA: III  Anesthesia Plan: General   Post-op Pain Management:    Induction: Intravenous  PONV Risk Score and Plan: 3 and Ondansetron and Treatment may vary due to age or medical condition  Airway Management Planned: Mask and Natural Airway  Additional Equipment: None  Intra-op Plan:   Post-operative Plan:   Informed Consent: I have reviewed the patients History and Physical, chart,  labs and discussed the procedure including the risks, benefits and alternatives for the proposed anesthesia with the patient or authorized representative who has indicated his/her understanding and acceptance.     Dental advisory given  Plan Discussed with: CRNA  Anesthesia Plan Comments:         Anesthesia Quick Evaluation

## 2019-12-12 NOTE — Transfer of Care (Signed)
Immediate Anesthesia Transfer of Care Note  Patient: [name]  Procedure(s) Performed: CLOSED REDUCTION RIGHT ELBOW (Right Elbow)  Patient Location: PACU  Anesthesia Type:General  Level of Consciousness: awake, oriented and patient cooperative  Airway & Oxygen Therapy: Patient Spontanous Breathing and Patient connected to nasal cannula oxygen  Post-op Assessment: Report given to RN and Post -op Vital signs reviewed and stable  Post vital signs: Reviewed  Last Vitals:  Vitals Value Taken Time  BP 119/66 12/12/19 1417  Temp    Pulse 66 12/12/19 1419  Resp 15 12/12/19 1419  SpO2 94 % 12/12/19 1419  Vitals shown include unvalidated device data.  Last Pain:  Vitals:   12/12/19 1242  TempSrc: Oral  PainSc:          Complications: No apparent anesthesia complications

## 2019-12-12 NOTE — Op Note (Addendum)
Orthopaedic Surgery Operative Note (CSN: [id number] ) Date of Surgery: 12/12/2019  Admit Date: 12/11/2019   Diagnoses: Pre-Op Diagnoses: Right elbow dislocation  Post-Op Diagnosis: Same  Procedures: CPT 24605-Closed reduction of right elbow dislocation  Surgeons : Primary: Valdis Bevill, Thomasene Lot, MD  Assistant: Patrecia Pace, PA-C  Location: OR 8   Anesthesia: Sedation  Antibiotics: None   Tourniquet time:None    Estimated Blood Loss:None  Complications:None   Specimens:None  Implants: * No implants in log *   Indications for Surgery: 74 year old female who sustained a ground-level fall and had a right elbow posterior dislocation.  She presents emergency room where a closed reduction was attempted in the emergency room but unfortunately she had a syncopal episode.  They were uncomfortable performing a closed reduction under conscious sedation requested anesthesia.  As result I felt that she was indicated for close reduction under anesthesia.  Risks and benefits were discussed with the patient including need for open approach, instability of the elbow, stiffness, nerve or blood vessel injury, possible fracture, even the possibility anesthetic complications.  They agreed to proceed with surgery and consent was obtained.  Operative Findings: Successful closed reduction of right elbow without notable instability with stress exam under fluoroscopy  Procedure: The patient was identified in the preoperative holding area. Consent was confirmed with the patient and their family and all questions were answered. The operative extremity was marked after confirmation with the patient. she was then brought back to the operating room by our anesthesia colleagues.  She was placed under sedation.  She was carefully transferred over to a radiolucent flat top table.  A timeout was performed to verify the patient, the procedure, and the extremity.  Fluoroscopic imaging showed the elbow dislocation.  A  posterior directed force to the humerus was applied with traction and anterior force to the ulna.  A palpable clunk was felt.  Fluoroscopic imaging showed concentric reduction of the ulnohumeral joint.  The elbow was taken through a full range of motion from extension to flexion without any instability or subluxation.  The patient was then placed in a long-arm splint.  She was awoken from anesthesia and taken the PACU in stable condition.  Post Op Plan/Instructions: Patient will be nonweightbearing to right upper extremity.  She will have a splint for 1 week and then return for removal splint with x-rays and likely allow for gentle active and passive range of motion.  No DVT prophylaxis is needed from an orthopedic perspective. She will mobilize with physical therapy.  I was present and performed the entire surgery.  Patrecia Pace, PA-C did assist me throughout the case. An assistant was necessary given the difficulty in reduction of the dislocation.   Katha Hamming, MD [facility]

## 2019-12-13 ENCOUNTER — Inpatient Hospital Stay (HOSPITAL_COMMUNITY): Payer: Medicare Other

## 2019-12-13 LAB — CBC
HCT: 33.2 % — ABNORMAL LOW (ref 36.0–46.0)
Hemoglobin: 10.3 g/dL — ABNORMAL LOW (ref 12.0–15.0)
MCH: 28.8 pg (ref 26.0–34.0)
MCHC: 31 g/dL (ref 30.0–36.0)
MCV: 92.7 fL (ref 80.0–100.0)
Platelets: 142 10*3/uL — ABNORMAL LOW (ref 150–400)
RBC: 3.58 MIL/uL — ABNORMAL LOW (ref 3.87–5.11)
RDW: 13.9 % (ref 11.5–15.5)
WBC: 3.7 10*3/uL — ABNORMAL LOW (ref 4.0–10.5)
nRBC: 0 % (ref 0.0–0.2)

## 2019-12-13 LAB — BASIC METABOLIC PANEL
Anion gap: 7 (ref 5–15)
BUN: 16 mg/dL (ref 8–23)
CO2: 27 mmol/L (ref 22–32)
Calcium: 9 mg/dL (ref 8.9–10.3)
Chloride: 104 mmol/L (ref 98–111)
Creatinine, Ser: 0.82 mg/dL (ref 0.44–1.00)
GFR calc Af Amer: >60 mL/min (ref >60)
GFR calc non Af Amer: >60 mL/min (ref >60)
Glucose, Bld: 109 mg/dL — ABNORMAL HIGH (ref 70–99)
Potassium: 4.3 mmol/L (ref 3.5–5.1)
Sodium: 138 mmol/L (ref 135–145)

## 2019-12-13 LAB — GLUCOSE, CAPILLARY: Glucose-Capillary: 104 mg/dL — ABNORMAL HIGH (ref 70–99)

## 2019-12-13 LAB — MAGNESIUM: Magnesium: 1.8 mg/dL (ref 1.7–2.4)

## 2019-12-13 IMAGING — CT CT ELBOW*R* W/O CM
3 series · 15 of 35 positions shown, 18 images · non-contrast
Comparison: Radiographs of [DATE] and [DATE]

CLINICAL DATA: Fall, status post reduction, elbow fracture.

EXAM:
CT OF THE LOWER RIGHT EXTREMITY WITHOUT CONTRAST
TECHNIQUE: Multidetector CT imaging of the right lower extremity was performed
according to the standard protocol.

[Series 4: 2 1.5 mm st ax · axial · 0.32mm/px · z∈[-184,-42]mm · 7 of 113 slices shown, 9 images]
[im 9/113  soft-tissue]
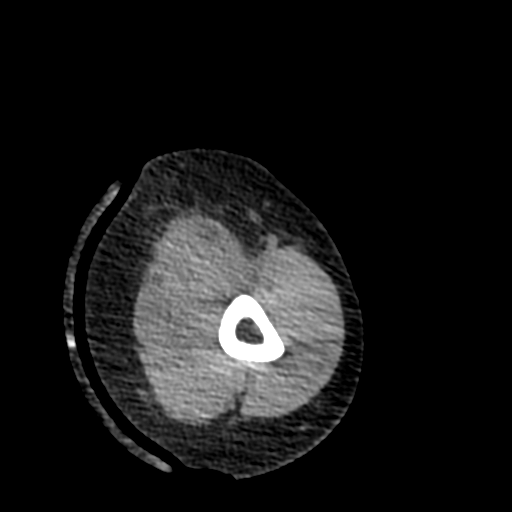
[im 9/113  bone]
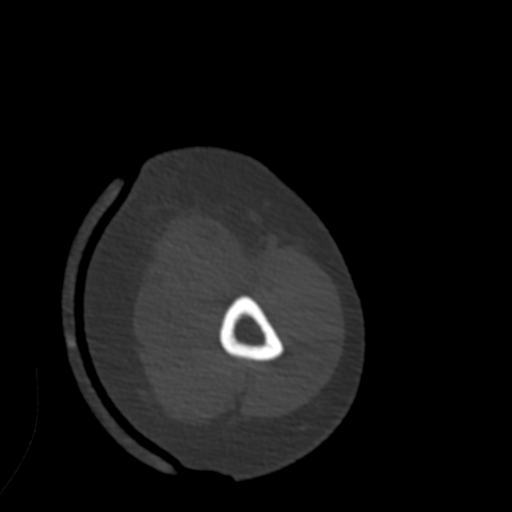
[im 26/113  bone]
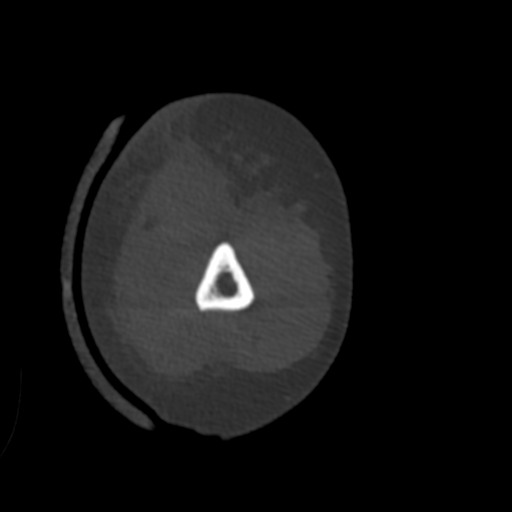
[im 44/113  bone]
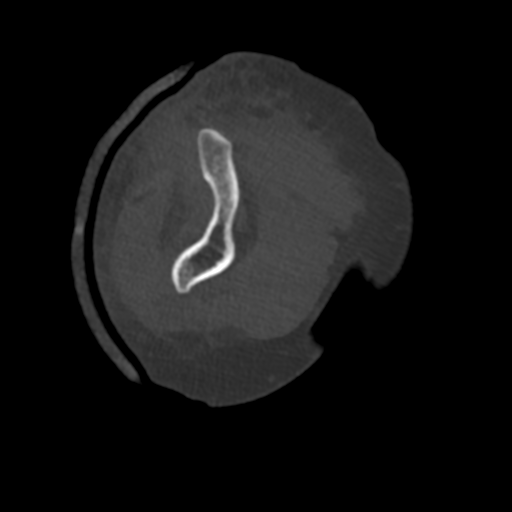
[im 61/113  bone]
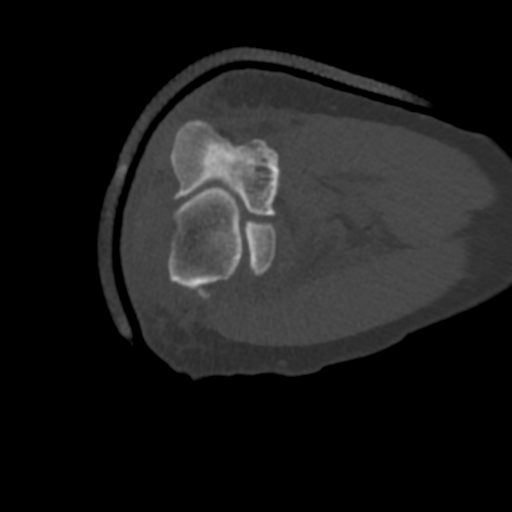
[im 69/113  soft-tissue]
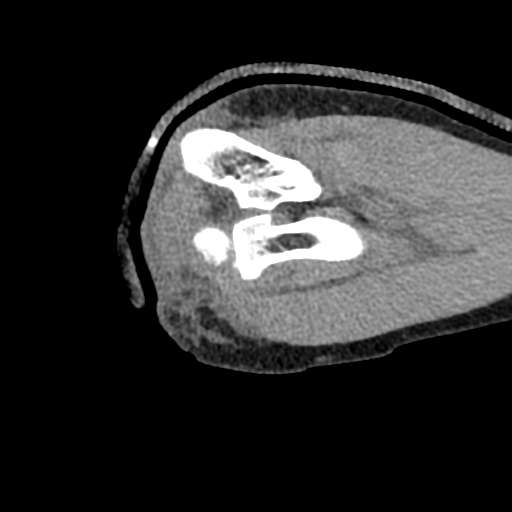
[im 69/113  bone]
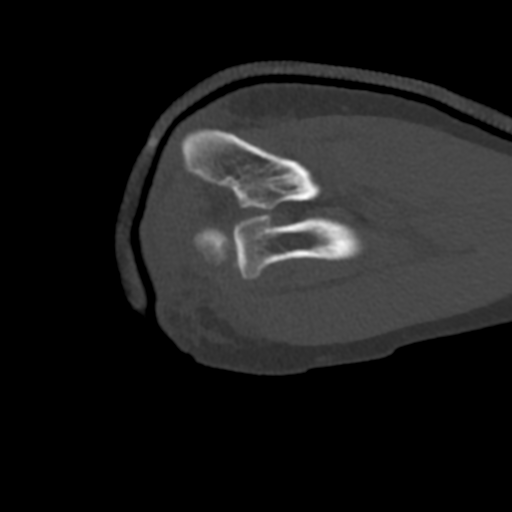
[im 87/113  bone]
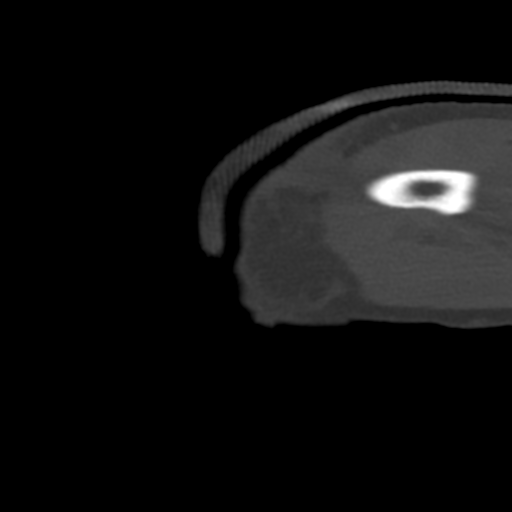
[im 104/113  bone]
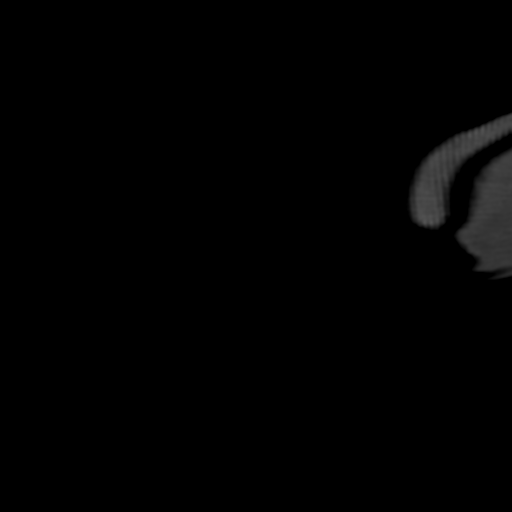

[Series 8: sag st · coronal · 0.41mm/px · 3 of 61 slices shown]
[im 13/61  bone]
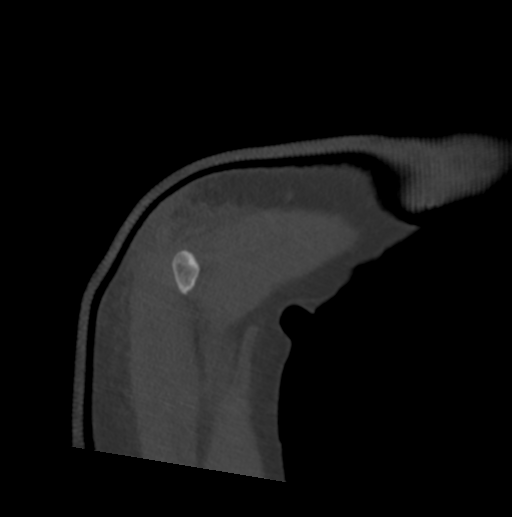
[im 25/61  bone]
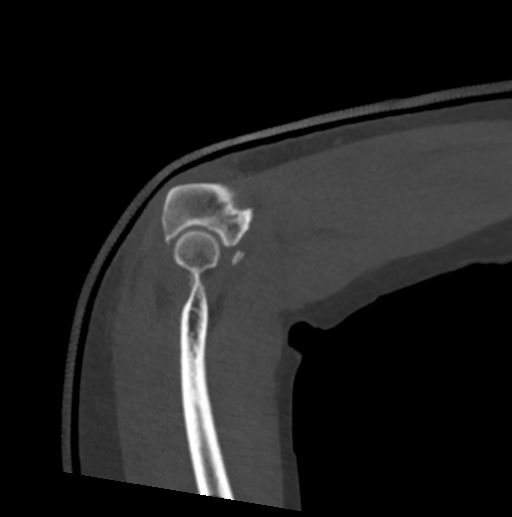
[im 37/61  bone]
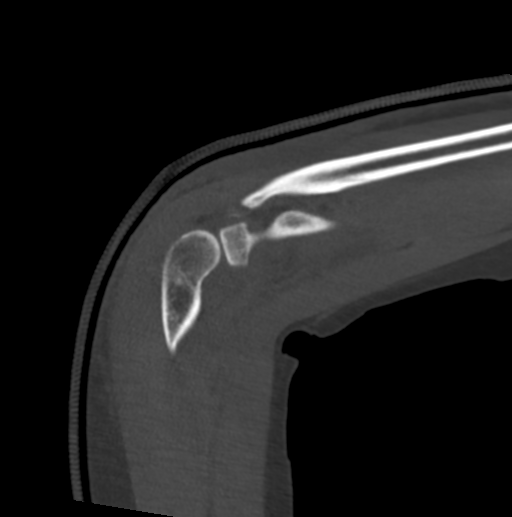

[Series 10: coronal st · sagittal · 0.38mm/px · 5 of 88 slices shown, 6 images]
[im 30/88  bone]
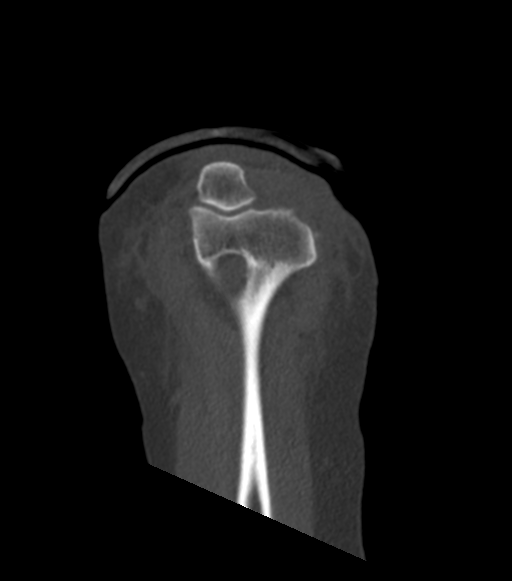
[im 37/88  bone]
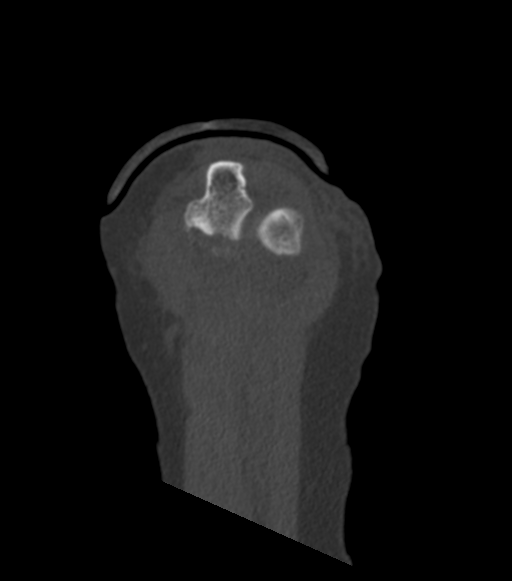
[im 44/88  soft-tissue]
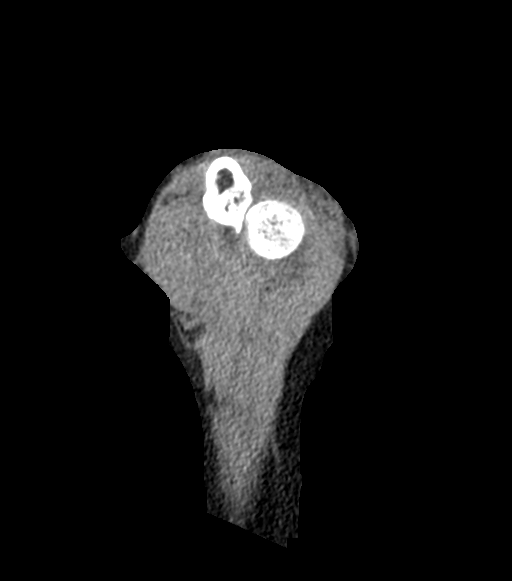
[im 44/88  bone]
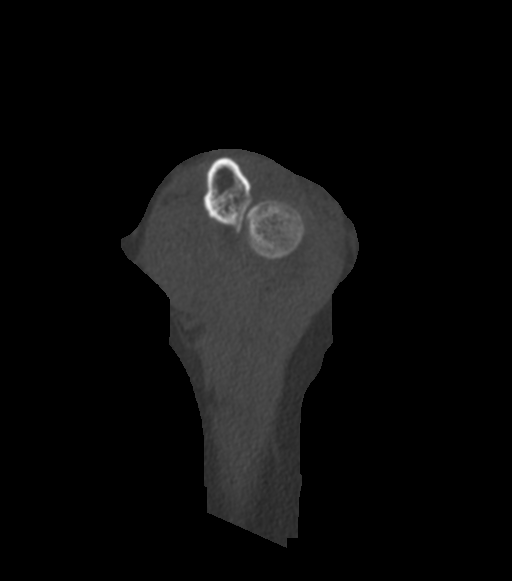
[im 51/88  bone]
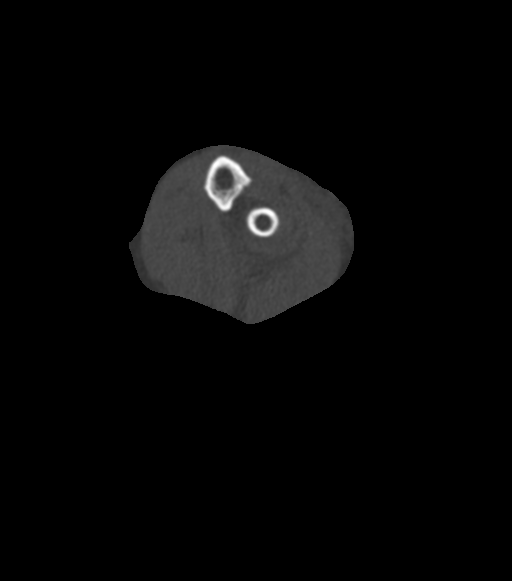
[im 59/88  bone]
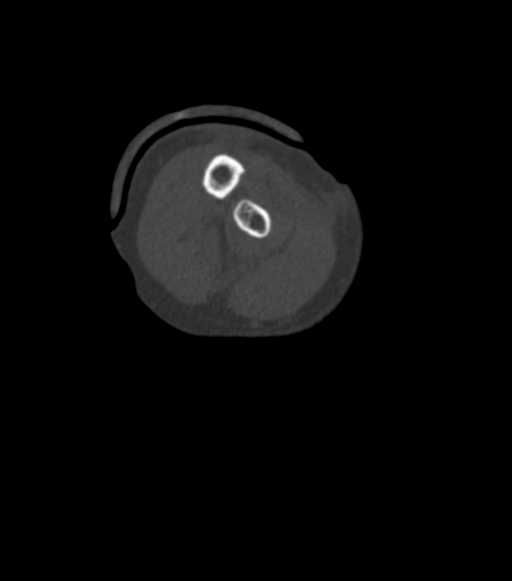

[15 of 35 positions shown; findings below may reference images not displayed]

FINDINGS: Bones/Joint/Cartilage

Coronoid process fracture with resulting fragment displaced distally
about 0.5 cm. The coronoid process fragment measures 0.9 by 0.6 by
1.8 cm. Additional tiny bony fragment from the sublime tubercle on
image 55/3.

There is spurring of the radial head. 0.2 cm linear bony fragment
along the dorsal-lateral rim of the radial head favoring a subtle
radial head fracture.

Small calcifications along the expected course of the common
extensor tendon may reflect small avulsion site on images 61 through
64 of series 3.

As expected, there is an elbow joint effusion

Ligaments

Suboptimally assessed by CT.

Muscles and Tendons

Grossly unremarkable

Soft tissues

Dorsal subcutaneous edema overlying the olecranon.
IMPRESSION: 1. Coronoid process fracture with resulting fragment displaced
distally about 0.5 cm.
2. Additional tiny bony fragment from the sublime tubercle.
3. 0.2 cm linear bony fragment along the dorsal-lateral rim of the
radial head favoring a subtle radial head fracture.
4. Small calcifications along the expected course of the common
extensor tendon may reflect small avulsions.
5. As expected, there is an elbow joint effusion.
6. Dorsal subcutaneous edema overlying the olecranon.

## 2019-12-13 MED ORDER — OXYCODONE HCL 5 MG PO TABS: 5.0000 mg | ORAL_TABLET | Freq: Four times a day (QID) | ORAL | 0 refills | Status: AC | PRN

## 2019-12-13 MED ORDER — SENNOSIDES-DOCUSATE SODIUM 8.6-50 MG PO TABS: 2.0000 | ORAL_TABLET | Freq: Every evening | ORAL | 0 refills | Status: DC | PRN

## 2019-12-13 NOTE — Anesthesia Postprocedure Evaluation (Signed)
Anesthesia Post Note  Patient: [name]  Procedure(s) Performed: CLOSED REDUCTION RIGHT ELBOW (Right Elbow)     Patient location during evaluation: PACU Anesthesia Type: General Level of consciousness: patient cooperative and awake Pain management: pain level controlled Vital Signs Assessment: post-procedure vital signs reviewed and stable Respiratory status: spontaneous breathing, nonlabored ventilation, respiratory function stable and patient connected to nasal cannula oxygen Cardiovascular status: blood pressure returned to baseline and stable Postop Assessment: no apparent nausea or vomiting Anesthetic complications: no    Last Vitals:  Vitals:   12/12/19 2127 12/13/19 0546  BP: (!) 100/54 (!) 99/51  Pulse: (!) 59 (!) 59  Resp: 18 14  Temp: 36.7 C 36.9 C  SpO2: 95% 96%    Last Pain:  Vitals:   12/13/19 1100  TempSrc:   PainSc: 8                  Janeene Sand

## 2019-12-13 NOTE — Evaluation (Signed)
Physical Therapy Evaluation Patient Details Name: [name] MRN: [id number] DOB: 10/28/1945 Today's Date: 12/13/2019   History of Present Illness  74 y.o. female with history of COPD, hypertension, hyperlipidemia, migraine, and prediabetes. She had a fall at home when she tripped on the base of her swing sustaining R elbow dislocation. She underwent closed reduction 12/12/19.    Clinical Impression  PT eval complete. PTA pt lived alone independent. On eval, she required min guard assist transfers and min/HHA ambulation 100'. Pt reports having cane, BSC and shower seat for use at home. Family/friends are able to provide 24-hour assist. Recommending HHPT. Pt agreeable. Pt to d/c home today. PT signing off.    Follow Up Recommendations Home health PT;Supervision/Assistance - 24 hour    Equipment Recommendations  None recommended by PT    Recommendations for Other Services       Precautions / Restrictions Precautions Precautions: Fall Required Braces or Orthoses: Sling Restrictions Weight Bearing Restrictions: Yes RUE Weight Bearing: Non weight bearing      Mobility  Bed Mobility               General bed mobility comments: Pt up in recliner and returned to recliner.  Transfers Overall transfer level: Needs assistance Equipment used: None Transfers: Sit to/from Stand Sit to Stand: Min guard         General transfer comment: increased time to power up and stabilize balance. Min guard assist for safety.  Ambulation/Gait Ambulation/Gait assistance: Min assist Gait Distance (Feet): 100 Feet Assistive device: 1 person hand held assist Gait Pattern/deviations: Step-through pattern;Decreased stride length Gait velocity: decreased Gait velocity interpretation: <1.31 ft/sec, indicative of household ambulator General Gait Details: slow, guarded gait; HHA to simulate cane  Stairs            Wheelchair Mobility    Modified Rankin (Stroke Patients Only)        Balance Overall balance assessment: Needs assistance Sitting-balance support: No upper extremity supported;Feet supported Sitting balance-Leahy Scale: Good     Standing balance support: Single extremity supported;During functional activity Standing balance-Leahy Scale: Fair                               Pertinent Vitals/Pain Pain Assessment: Faces Faces Pain Scale: Hurts little more Pain Location: RUE Pain Descriptors / Indicators: Discomfort;Sore;Grimacing;Guarding Pain Intervention(s): Limited activity within patient's tolerance;Monitored during session;Premedicated before session    Home Living Family/patient expects to be discharged to:: Private residence Living Arrangements: Alone Available Help at Discharge: Family;Friend(s);Available 24 hours/day Type of Home: House Home Access: Level entry     Home Layout: Laundry or work area in basement Home Equipment: Cane - single point;Shower seat;Bedside commode      Prior Function Level of Independence: Independent               Hand Dominance   Dominant Hand: Right    Extremity/Trunk Assessment   Upper Extremity Assessment Upper Extremity Assessment: RUE deficits/detail RUE Deficits / Details: immobilized in sling (x 1 week per MD note, then progress to gentle ROM), edema noted R hand    Lower Extremity Assessment Lower Extremity Assessment: Generalized weakness    Cervical / Trunk Assessment Cervical / Trunk Assessment: Kyphotic  Communication   Communication: No difficulties  Cognition Arousal/Alertness: Awake/alert Behavior During Therapy: WFL for tasks assessed/performed Overall Cognitive Status: Within Functional Limits for tasks assessed                                          General Comments General comments (skin integrity, edema, etc.): VSS    Exercises     Assessment/Plan    PT Assessment All further PT needs can be met in the next venue of care  PT  Problem List Decreased strength;Decreased mobility;Decreased activity tolerance;Pain;Decreased balance       PT Treatment Interventions      PT Goals (Current goals can be found in the Care Plan section)  Acute Rehab PT Goals Patient Stated Goal: home today PT Goal Formulation: All assessment and education complete, DC therapy    Frequency     Barriers to discharge        Co-evaluation               AM-PAC PT "6 Clicks" Mobility  Outcome Measure Help needed turning from your back to your side while in a flat bed without using bedrails?: A Little Help needed moving from lying on your back to sitting on the side of a flat bed without using bedrails?: A Little Help needed moving to and from a bed to a chair (including a wheelchair)?: A Little Help needed standing up from a chair using your arms (e.g., wheelchair or bedside chair)?: A Little Help needed to walk in hospital room?: A Little Help needed climbing 3-5 steps with a railing? : A Little 6 Click Score: 18    End of Session Equipment Utilized During Treatment: Gait belt Activity Tolerance: Patient tolerated treatment well Patient left: in chair;with call bell/phone within reach Nurse Communication: Mobility status PT Visit Diagnosis: Unsteadiness on feet (R26.81);Pain;Muscle weakness (generalized) (M62.81) Pain - Right/Left: Right Pain - part of body: Arm    Time: 6789-3810 PT Time Calculation (min) (ACUTE ONLY): 30 min   Charges:   PT Evaluation $PT Eval Moderate Complexity: 1 Mod PT Treatments $Gait Training: 8-22 mins        Lorrin Goodell, PT  Office # [phone] Pager [phone]   Lorriane Shire 12/13/2019, 9:21 AM

## 2019-12-13 NOTE — Progress Notes (Addendum)
Subjective: 1 Day Post-Op s/p Procedure(s): CLOSED REDUCTION RIGHT ELBOW   Patient is alert, oriented, sitting comfortably in bed. Patient reports pain as moderate. Denies chest pain, SOB, Calf pain. No nausea/vomiting. No other complaints.  Objective:  PE: VITALS:   Vitals:   12/12/19 1733 12/12/19 2054 12/12/19 2127 12/13/19 0546  BP: (!) 130/93  (!) 100/54 (!) 99/51  Pulse: (!) 57  (!) 59 (!) 59  Resp: 18  18 14   Temp: 97.7 F (36.5 C)  98 F (36.7 C) 98.4 F (36.9 C)  TempSrc: Oral  Oral Oral  SpO2: 100% 98% 95% 96%  Weight:      Height:       General: Alert, oriented, in no acute distress Resp: no use of accessory musculature RUE: Moderate edema to right hand. Able to flex, extend, and abduct all fingers of right hand. Distal sensation intact. Hand warm and well perfused.   LABS  Results for orders placed or performed during the hospital encounter of 12/11/19 (from the past 24 hour(s))  VITAMIN D 25 Hydroxy (Vit-D Deficiency, Fractures)     Status: None   Collection Time: 12/12/19  6:28 PM  Result Value Ref Range   Vit D, 25-Hydroxy 56.06 30 - 100 ng/mL  Basic metabolic panel     Status: Abnormal   Collection Time: 12/13/19  2:01 AM  Result Value Ref Range   Sodium 138 135 - 145 mmol/L   Potassium 4.3 3.5 - 5.1 mmol/L   Chloride 104 98 - 111 mmol/L   CO2 27 22 - 32 mmol/L   Glucose, Bld 109 (H) 70 - 99 mg/dL   BUN 16 8 - 23 mg/dL   Creatinine, Ser 0.82 0.44 - 1.00 mg/dL   Calcium 9.0 8.9 - 10.3 mg/dL   GFR calc non Af Amer >60 >60 mL/min   GFR calc Af Amer >60 >60 mL/min   Anion gap 7 5 - 15  CBC     Status: Abnormal   Collection Time: 12/13/19  2:01 AM  Result Value Ref Range   WBC 3.7 (L) 4.0 - 10.5 K/uL   RBC 3.58 (L) 3.87 - 5.11 MIL/uL   Hemoglobin 10.3 (L) 12.0 - 15.0 g/dL   HCT 33.2 (L) 36.0 - 46.0 %   MCV 92.7 80.0 - 100.0 fL   MCH 28.8 26.0 - 34.0 pg   MCHC 31.0 30.0 - 36.0 g/dL   RDW 13.9 11.5 - 15.5 %   Platelets 142 (L) 150 - 400 K/uL    nRBC 0.0 0.0 - 0.2 %  Magnesium     Status: None   Collection Time: 12/13/19  2:01 AM  Result Value Ref Range   Magnesium 1.8 1.7 - 2.4 mg/dL  Glucose, capillary     Status: Abnormal   Collection Time: 12/13/19  6:54 AM  Result Value Ref Range   Glucose-Capillary 104 (H) 70 - 99 mg/dL   Comment 1 Notify RN    Comment 2 Document in Chart     DG Ribs Unilateral W/Chest Right  Result Date: 12/11/2019 CLINICAL DATA:  Status post fall. EXAM: RIGHT RIBS AND CHEST - 3+ VIEW COMPARISON:  November 01, 2011 FINDINGS: No fracture or other bone lesions are seen involving the ribs. Posterior dislocation of the right elbow is seen. There is no evidence of pneumothorax or pleural effusion. Both lungs are clear. A large, stable hiatal hernia is seen. Heart size and mediastinal contours are within normal limits. IMPRESSION: 1. No  acute rib fracture. 2. Posterior dislocation of the right elbow. 3. Large, stable hiatal hernia. * Electronically Signed   By: Virgina Norfolk M.D.   On: 12/11/2019 22:43   DG Elbow 2 Views Right  Result Date: 12/12/2019 CLINICAL DATA:  Status post closed reduction RIGHT elbow. EXAM: RIGHT ELBOW - 2 VIEW COMPARISON:  Plain film of the wrist dated 12/11/2019. FINDINGS: Osseous alignment is now anatomic. 8 mm avulsion fracture fragment is now seen at the anterior margin of the elbow. Overlying casting in place. IMPRESSION: 1. Anatomic alignment status post reduction and casting. 2. Displaced avulsion fracture, measuring 8 mm, now seen at the anterior margin of the elbow. Electronically Signed   By: Franki Cabot M.D.   On: 12/12/2019 15:39   DG Elbow Complete Right  Result Date: 12/12/2019 CLINICAL DATA:  Relocation of RIGHT elbow dislocation. EXAM: RIGHT ELBOW - COMPLETE 3+ VIEW; DG C-ARM 1-60 MIN COMPARISON:  12/11/2019 FINDINGS: Intraoperative spot views of the RIGHT elbow are submitted postoperatively for interpretation. The RIGHT UPPER lobe is located on the LEFT two views. No  definite fracture is noted on these intraoperative views. IMPRESSION: Relocation of the RIGHT elbow. Electronically Signed   By: Margarette Canada M.D.   On: 12/12/2019 14:30   DG Knee Complete 4 Views Right  Result Date: 12/12/2019 CLINICAL DATA:  Status post fall. EXAM: RIGHT KNEE - COMPLETE 4+ VIEW COMPARISON:  None. FINDINGS: No evidence of fracture, dislocation, or joint effusion. There is marked severity narrowing of the medial and lateral tibiofemoral compartment spaces. Mild patellofemoral narrowing is also seen. Very mild lateral chondrocalcinosis is seen. Soft tissues are unremarkable. IMPRESSION: Marked severity degenerative changes without evidence of acute osseous abnormality. Electronically Signed   By: Virgina Norfolk M.D.   On: 12/12/2019 01:30   DG Humerus Right  Result Date: 12/11/2019 CLINICAL DATA:  Status post fall. EXAM: RIGHT HUMERUS - 2+ VIEW COMPARISON:  None. FINDINGS: Posterior dislocation of the right elbow is seen. No acute fracture deformities are identified. Soft tissues are unremarkable. IMPRESSION: Posterior dislocation of the right elbow. Electronically Signed   By: Virgina Norfolk M.D.   On: 12/11/2019 22:46   DG C-Arm 1-60 Min  Result Date: 12/12/2019 CLINICAL DATA:  Relocation of RIGHT elbow dislocation. EXAM: RIGHT ELBOW - COMPLETE 3+ VIEW; DG C-ARM 1-60 MIN COMPARISON:  12/11/2019 FINDINGS: Intraoperative spot views of the RIGHT elbow are submitted postoperatively for interpretation. The RIGHT UPPER lobe is located on the LEFT two views. No definite fracture is noted on these intraoperative views. IMPRESSION: Relocation of the RIGHT elbow. Electronically Signed   By: Margarette Canada M.D.   On: 12/12/2019 14:30   ECHOCARDIOGRAM COMPLETE  Result Date: 12/12/2019    ECHOCARDIOGRAM REPORT   Patient Name:   [name] Date of Exam: 12/12/2019 Medical Rec #:  [id number]          Height:       67.0 in Accession #:    [id number]         Weight:       198.4 lb Date of  Birth:  12-24-1945         BSA:          2.015 m Patient Age:    74 years           BP:           173/69 mmHg Patient Gender: F  HR:           66 bpm. Exam Location:  Inpatient Procedure: 2D Echo, Color Doppler and Cardiac Doppler Indications:    R55 Syncope  History:        Patient has no prior history of Echocardiogram examinations.                 COPD; Risk Factors:Hypertension and Dyslipidemia.  Sonographer:    Raquel Sarna Senior RDCS Referring Phys: [place]  1. Left ventricular ejection fraction, by estimation, is 60 to 65%. The left ventricle has normal function. The left ventricle has no regional wall motion abnormalities. There is moderate left ventricular hypertrophy. Left ventricular diastolic parameters are consistent with age-related delayed relaxation (normal).  2. Right ventricular systolic function is normal. The right ventricular size is normal.  3. The mitral valve is normal in structure. Trivial mitral valve regurgitation. No evidence of mitral stenosis.  4. The aortic valve is tricuspid. Aortic valve regurgitation is not visualized. Mild to moderate aortic valve sclerosis/calcification is present, without any evidence of aortic stenosis.  5. The inferior vena cava is normal in size with greater than 50% respiratory variability, suggesting right atrial pressure of 3 mmHg. FINDINGS  Left Ventricle: Left ventricular ejection fraction, by estimation, is 60 to 65%. The left ventricle has normal function. The left ventricle has no regional wall motion abnormalities. The left ventricular internal cavity size was normal in size. There is  moderate left ventricular hypertrophy. Left ventricular diastolic parameters are consistent with age-related delayed relaxation (normal). Right Ventricle: The right ventricular size is normal. No increase in right ventricular wall thickness. Right ventricular systolic function is normal. Left Atrium: Left atrial size was normal in  size. Right Atrium: Right atrial size was not well visualized. Pericardium: There is no evidence of pericardial effusion. Mitral Valve: The mitral valve is normal in structure. There is mild thickening of the mitral valve leaflet(s). There is mild calcification of the mitral valve leaflet(s). Normal mobility of the mitral valve leaflets. Trivial mitral valve regurgitation. No evidence of mitral valve stenosis. Tricuspid Valve: The tricuspid valve is normal in structure. Tricuspid valve regurgitation is mild . No evidence of tricuspid stenosis. Aortic Valve: The aortic valve is tricuspid. Aortic valve regurgitation is not visualized. Mild to moderate aortic valve sclerosis/calcification is present, without any evidence of aortic stenosis. Pulmonic Valve: The pulmonic valve was normal in structure. Pulmonic valve regurgitation is not visualized. No evidence of pulmonic stenosis. Aorta: The aortic root is normal in size and structure. Venous: The inferior vena cava is normal in size with greater than 50% respiratory variability, suggesting right atrial pressure of 3 mmHg. IAS/Shunts: No atrial level shunt detected by color flow Doppler.  LEFT VENTRICLE PLAX 2D LVIDd:         3.80 cm  Diastology LVIDs:         2.60 cm  LV e' lateral:   9.25 cm/s LV PW:         1.10 cm  LV E/e' lateral: 10.5 LV IVS:        1.40 cm  LV e' medial:    7.72 cm/s LVOT diam:     1.80 cm  LV E/e' medial:  12.5 LV SV:         77 LV SV Index:   38 LVOT Area:     2.54 cm  RIGHT VENTRICLE RV S prime:     12.80 cm/s TAPSE (M-mode): 2.1 cm LEFT ATRIUM  Index       RIGHT ATRIUM           Index LA diam:        3.50 cm 1.74 cm/m  RA Area:     15.70 cm LA Vol (A2C):   64.8 ml 32.15 ml/m RA Volume:   38.10 ml  18.90 ml/m LA Vol (A4C):   69.2 ml 34.34 ml/m LA Biplane Vol: 70.2 ml 34.83 ml/m  AORTIC VALVE LVOT Vmax:   123.00 cm/s LVOT Vmean:  93.700 cm/s LVOT VTI:    0.304 m  AORTA Ao Root diam: 3.00 cm MITRAL VALVE MV Area (PHT): 2.87 cm      SHUNTS MV Decel Time: 264 msec     Systemic VTI:  0.30 m MV E velocity: 96.70 cm/s   Systemic Diam: 1.80 cm MV A velocity: 113.00 cm/s MV E/A ratio:  0.86 Jenkins Rouge MD Electronically signed by Jenkins Rouge MD Signature Date/Time: 12/12/2019/11:26:20 AM    Final     Assessment/Plan: Principal Problem:   Vaso vagal episode Active Problems:   Prediabetes   History of kidney disease   Closed posterior dislocation of right elbow   Essential hypertension   right elbow dislocation: 1 Day Post-Op s/p Procedure(s): CLOSED REDUCTION RIGHT ELBOW - Post-operative x-rays show new displaced avulsion fracture measuring 27mm - will get new right elbow ct, of note patient has contrast allergy - patient ok for discharge once CT is complete  Weightbearing: NWB RUE Insicional and dressing care: continue splint at discharge VTE prophylaxis: None at discharge Pain control: oxycodone 5 mg on discharge Follow - up plan: Follow up with Dr. Doreatha Martin in 1 week Dispo: ok to discharge from ortho standpoint  Contact information:   Weekdays 8-5 Merlene Pulling, PA-C [phone] A fter hours and holidays please check [email or web address] for group call information for [facility] [place] 12/13/2019, 10:37 AM

## 2019-12-13 NOTE — Discharge Summary (Signed)
Physician Discharge Summary  [name] [id number] DOB: 08/28/1945 DOA: 12/11/2019  PCP: Jilda Panda, MD  Admit date: 12/11/2019 Discharge date: 12/13/2019  Admitted From: Home Disposition: Home  Recommendations for Outpatient Follow-up:  1. Follow up with PCP in 1-2 weeks 2. Please obtain BMP/CBC in one week your next doctors visit.  3. Follow-up outpatient orthopedic, Dr. Doreatha Martin in 1 week.  Nonweightbearing of left upper extremity, maintain splint for now.  No DVT prophylaxis necessary. 4. Outpatient follow-up with cardiology as necessary per PCP   Discharge Condition: Stable CODE STATUS: Full Diet recommendation: Heart healthy  Brief/Interim Summary: 73-year with history of COPD, HTN, HLD, migraine, prediabetes had a fall at home suffering from right elbow dislocation.  This was attempted to be reduced in the ER but patient had brief episode of asystole lasting for about 4-6 seconds.  Cardiology was called who recommended monitoring the patient as this was likely vasovagal.  Orthopedic was consulted for surgical reduction.  Patient underwent reduction on 4/30 tolerated procedure well.  Echocardiogram was unremarkable with normal ejection fraction.  No acute events overnight.  Remained stable from a cardiopulmonary standpoint.  Stable for discharge with recommendations as stated above.   Assessment & Plan:   Fall causing right elbow dislocation -Status post closed reduction in the OR per orthopedic.  Maintain splint for 1 week and follow-up outpatient with Dr. Doreatha Martin.  No DVT prophylaxis necessary  Brief asystole, suspect vasovagal -No further episodes.  Echocardiogram shows normal EF without any other gross abnormality.  Follow-up outpatient PCP.  Can get cardiology referral if necessary.  History of COPD -As needed bronchodilators  Essential hypertension -Daily losartan  Hyperlipidemia -Lipitor 20 mg daily  Peripheral  neuropathy -Gabapentin   Discharge Diagnoses:  Principal Problem:   Vaso vagal episode Active Problems:   Prediabetes   History of kidney disease   Closed posterior dislocation of right elbow   Essential hypertension    Consultations:  Orthopedic  Subjective: Feels great no complaints  Discharge Exam: Vitals:   12/12/19 2127 12/13/19 0546  BP: (!) 100/54 (!) 99/51  Pulse: (!) 59 (!) 59  Resp: 18 14  Temp: 98 F (36.7 C) 98.4 F (36.9 C)  SpO2: 95% 96%   Vitals:   12/12/19 1733 12/12/19 2054 12/12/19 2127 12/13/19 0546  BP: (!) 130/93  (!) 100/54 (!) 99/51  Pulse: (!) 57  (!) 59 (!) 59  Resp: 18  18 14   Temp: 97.7 F (36.5 C)  98 F (36.7 C) 98.4 F (36.9 C)  TempSrc: Oral  Oral Oral  SpO2: 100% 98% 95% 96%  Weight:      Height:        General: Pt is alert, awake, not in acute distress Cardiovascular: RRR, S1/S2 +, no rubs, no gallops Respiratory: CTA bilaterally, no wheezing, no rhonchi Abdominal: Soft, NT, ND, bowel sounds + Extremities: no edema, no cyanosis, left upper extremity splint in place  Discharge Instructions   Allergies as of 12/13/2019      Reactions   Contrast Media [iodinated Diagnostic Agents] Hives   Ether Other (See Comments)   hallucinations   Iohexol     Code: HIVES, Desc: pt had iv contrast for the first time today. doing a test dose w/ an miroi, 20cc contrast, pt immediately broke out in hives, itching, swelling of lips and tongue.  Check w/ rad concerning recommendations.50 mg of benedryl given po per dr Tery Sanfilippo., Onset Date: [id number]      Medication List  TAKE these medications   aspirin 81 MG tablet Take 81 mg by mouth daily.   aspirin-acetaminophen-caffeine 250-250-65 MG tablet Commonly known as: EXCEDRIN MIGRAINE Take 1 tablet by mouth as needed for headache.   atorvastatin 20 MG tablet Commonly known as: LIPITOR Take 1 tablet by mouth at bedtime.   betamethasone valerate ointment 0.1 % Commonly known as:  VALISONE Apply a pea sized amount 3 x a week. Can increase to BID for up to 1-2 weeks as needed   budesonide-formoterol 160-4.5 MCG/ACT inhaler Commonly known as: SYMBICORT Inhale 2 puffs into the lungs daily.   calcium carbonate 600 MG Tabs tablet Commonly known as: OS-CAL Take 600 mg by mouth 2 (two) times daily with a meal.   Cetirizine HCl 10 MG Caps Take 1 capsule (10 mg total) by mouth daily for 10 days.   COQ-10 PO Take 1 tablet by mouth daily.   diclofenac sodium 1 % Gel Commonly known as: VOLTAREN Apply 2 g topically 4 (four) times daily.   famotidine 20 MG tablet Commonly known as: PEPCID Take 1 tablet (20 mg total) by mouth 2 (two) times daily.   FOCUSED MIND PO Take 1 tablet by mouth daily.   gabapentin 100 MG capsule Commonly known as: Neurontin Take 1 capsule (100 mg total) by mouth at bedtime.   latanoprost 0.005 % ophthalmic solution Commonly known as: XALATAN Place 1 drop into the left eye at bedtime.   levocetirizine 5 MG tablet Commonly known as: XYZAL Take 5 mg by mouth daily.   lidocaine 5 % ointment Commonly known as: XYLOCAINE Apply 1 application topically 4 (four) times daily as needed. What changed: reasons to take this   losartan 100 MG tablet Commonly known as: COZAAR Take 100 mg by mouth daily.   oxyCODONE 5 MG immediate release tablet Commonly known as: Oxy IR/ROXICODONE Take 1 tablet (5 mg total) by mouth every 6 (six) hours as needed for up to 5 days for moderate pain or severe pain.   polyethylene glycol 17 g packet Commonly known as: MIRALAX / GLYCOLAX Take 17 g by mouth daily.   ProAir HFA 108 (90 Base) MCG/ACT inhaler Generic drug: albuterol Inhale 2 puffs into the lungs every 6 (six) hours as needed for wheezing or shortness of breath.   senna-docusate 8.6-50 MG tablet Commonly known as: Senokot-S Take 2 tablets by mouth at bedtime as needed for mild constipation or moderate constipation.   Ubrelvy 50 MG  Tabs Generic drug: Ubrogepant Take 50 mg by mouth as needed (may repeat once in 2 hours. no more than 2 pills in 24 h.).   vitamin C 250 MG tablet Commonly known as: ASCORBIC ACID Take 250 mg by mouth daily.   Vitamin D-3 25 MCG (1000 UT) Caps Take 1,000 Units by mouth daily.   ZINC PO Take 1 tablet by mouth daily.      Follow-up Information    Schedule an appointment as soon as possible for a visit with Haddix, Thomasene Lot, MD.   Specialty: Orthopedic Surgery Why: For repeat x-rays Contact information: [place] [address] [phone]        Jilda Panda, MD. Schedule an appointment as soon as possible for a visit in 1 week(s).   Specialty: Internal Medicine Contact information: [address] [place] [address] [phone]          Allergies  Allergen Reactions  . Contrast Media [Iodinated Diagnostic Agents] Hives  . Ether Other (See Comments)  hallucinations  . Iohexol      Code: HIVES, Desc: pt had iv contrast for the first time today. doing a test dose w/ an miroi, 20cc contrast, pt immediately broke out in hives, itching, swelling of lips and tongue.  Check w/ rad concerning recommendations.50 mg of benedryl given po per dr Tery Sanfilippo., Onset Date: [id number]     You were cared for by a hospitalist during your hospital stay. If you have any questions about your discharge medications or the care you received while you were in the hospital after you are discharged, you can call the unit and asked to speak with the hospitalist on call if the hospitalist that took care of you is not available. Once you are discharged, your primary care physician will handle any further medical issues. Please note that no refills for any discharge medications will be authorized once you are discharged, as it is imperative that you return to your primary care physician (or establish a relationship with a primary care physician if you do not have one) for your  aftercare needs so that they can reassess your need for medications and monitor your lab values.   Procedures/Studies: DG Ribs Unilateral W/Chest Right  Result Date: 12/11/2019 CLINICAL DATA:  Status post fall. EXAM: RIGHT RIBS AND CHEST - 3+ VIEW COMPARISON:  November 01, 2011 FINDINGS: No fracture or other bone lesions are seen involving the ribs. Posterior dislocation of the right elbow is seen. There is no evidence of pneumothorax or pleural effusion. Both lungs are clear. A large, stable hiatal hernia is seen. Heart size and mediastinal contours are within normal limits. IMPRESSION: 1. No acute rib fracture. 2. Posterior dislocation of the right elbow. 3. Large, stable hiatal hernia. * Electronically Signed   By: Virgina Norfolk M.D.   On: 12/11/2019 22:43   DG Elbow 2 Views Right  Result Date: 12/12/2019 CLINICAL DATA:  Status post closed reduction RIGHT elbow. EXAM: RIGHT ELBOW - 2 VIEW COMPARISON:  Plain film of the wrist dated 12/11/2019. FINDINGS: Osseous alignment is now anatomic. 8 mm avulsion fracture fragment is now seen at the anterior margin of the elbow. Overlying casting in place. IMPRESSION: 1. Anatomic alignment status post reduction and casting. 2. Displaced avulsion fracture, measuring 8 mm, now seen at the anterior margin of the elbow. Electronically Signed   By: Franki Cabot M.D.   On: 12/12/2019 15:39   DG Elbow Complete Right  Result Date: 12/12/2019 CLINICAL DATA:  Relocation of RIGHT elbow dislocation. EXAM: RIGHT ELBOW - COMPLETE 3+ VIEW; DG C-ARM 1-60 MIN COMPARISON:  12/11/2019 FINDINGS: Intraoperative spot views of the RIGHT elbow are submitted postoperatively for interpretation. The RIGHT UPPER lobe is located on the LEFT two views. No definite fracture is noted on these intraoperative views. IMPRESSION: Relocation of the RIGHT elbow. Electronically Signed   By: Margarette Canada M.D.   On: 12/12/2019 14:30   MR BRAIN W WO CONTRAST  Result Date: 11/14/2019  [facility]  [facility] [address] [place], [place] [address] [phone] NEUROIMAGING REPORT STUDY DATE: 11/14/2019 PATIENT NAME: [name] DOB: [birth year] MRN: [id number] EXAM: MRI Brain with and without contrast ORDERING CLINICIAN: Star Age, MD, PhD CLINICAL HISTORY: 74 year old woman with headaches COMPARISON FILMS: None TECHNIQUE:MRI of the brain with and without contrast was obtained utilizing 5 mm axial slices with T1, T2, T2 flair, SWI and diffusion weighted views.  T1 sagittal, T2 coronal and postcontrast views in the axial and coronal plane were obtained. CONTRAST:  14 ml [employer] IMAGING SITE: [facility], [place]. FINDINGS: On sagittal images, the spinal cord is imaged caudally to C2-C3 and is normal in caliber.   The contents of the posterior fossa are of normal size and position.   The pituitary gland and optic chiasm appear normal.    Brain volume appears normal for age.   The ventricles are normal in size and without distortion.  There are no abnormal extra-axial collections of fluid.  The cerebellum and brainstem appears normal.   The deep gray matter appears normal.  In the hemispheres, there are some scattered T2/FLAIR hyperintense foci predominantly in the subcortical and deep white matter.  None of these appear to be acute..   Diffusion weighted images are normal.  Susceptibility weighted images are normal.   The VIIth/VIIIth nerve complex appears normal.  There has been a right lens replacements.  The orbits are otherwise normal.  The mastoid air cells appear normal.  The paranasal sinuses appear normal.  Flow voids are identified within the major intracerebral arteries.  After the infusion of contrast material, a normal enhancement pattern is noted.   This MRI of the brain with and without contrast shows the following: 1.   Few scattered T2/FLAIR hyperintense foci in the hemispheres consistent with minimal chronic microvascular ischemic change,  typical for age. 2.    There are no acute findings and there is a normal enhancement pattern. INTERPRETING PHYSICIAN: Richard A. Felecia Shelling, MD, PhD, FAAN Certified in  Neuroimaging by [employer]   DG Knee Complete 4 Views Right  Result Date: 12/12/2019 CLINICAL DATA:  Status post fall. EXAM: RIGHT KNEE - COMPLETE 4+ VIEW COMPARISON:  None. FINDINGS: No evidence of fracture, dislocation, or joint effusion. There is marked severity narrowing of the medial and lateral tibiofemoral compartment spaces. Mild patellofemoral narrowing is also seen. Very mild lateral chondrocalcinosis is seen. Soft tissues are unremarkable. IMPRESSION: Marked severity degenerative changes without evidence of acute osseous abnormality. Electronically Signed   By: Virgina Norfolk M.D.   On: 12/12/2019 01:30   DG Humerus Right  Result Date: 12/11/2019 CLINICAL DATA:  Status post fall. EXAM: RIGHT HUMERUS - 2+ VIEW COMPARISON:  None. FINDINGS: Posterior dislocation of the right elbow is seen. No acute fracture deformities are identified. Soft tissues are unremarkable. IMPRESSION: Posterior dislocation of the right elbow. Electronically Signed   By: Virgina Norfolk M.D.   On: 12/11/2019 22:46   DG C-Arm 1-60 Min  Result Date: 12/12/2019 CLINICAL DATA:  Relocation of RIGHT elbow dislocation. EXAM: RIGHT ELBOW - COMPLETE 3+ VIEW; DG C-ARM 1-60 MIN COMPARISON:  12/11/2019 FINDINGS: Intraoperative spot views of the RIGHT elbow are submitted postoperatively for interpretation. The RIGHT UPPER lobe is located on the LEFT two views. No definite fracture is noted on these intraoperative views. IMPRESSION: Relocation of the RIGHT elbow. Electronically Signed   By: Margarette Canada M.D.   On: 12/12/2019 14:30   ECHOCARDIOGRAM COMPLETE  Result Date: 12/12/2019    ECHOCARDIOGRAM REPORT   Patient Name:   [name] Date of Exam: 12/12/2019 Medical Rec #:  [id number]          Height:       67.0 in Accession #:     [id number]         Weight:       198.4 lb Date of Birth:  23-Jan-1946         BSA:          2.015  m Patient Age:    31 years           BP:           173/69 mmHg Patient Gender: F                  HR:           66 bpm. Exam Location:  Inpatient Procedure: 2D Echo, Color Doppler and Cardiac Doppler Indications:    R55 Syncope  History:        Patient has no prior history of Echocardiogram examinations.                 COPD; Risk Factors:Hypertension and Dyslipidemia.  Sonographer:    Raquel Sarna Senior RDCS Referring Phys: [place]  1. Left ventricular ejection fraction, by estimation, is 60 to 65%. The left ventricle has normal function. The left ventricle has no regional wall motion abnormalities. There is moderate left ventricular hypertrophy. Left ventricular diastolic parameters are consistent with age-related delayed relaxation (normal).  2. Right ventricular systolic function is normal. The right ventricular size is normal.  3. The mitral valve is normal in structure. Trivial mitral valve regurgitation. No evidence of mitral stenosis.  4. The aortic valve is tricuspid. Aortic valve regurgitation is not visualized. Mild to moderate aortic valve sclerosis/calcification is present, without any evidence of aortic stenosis.  5. The inferior vena cava is normal in size with greater than 50% respiratory variability, suggesting right atrial pressure of 3 mmHg. FINDINGS  Left Ventricle: Left ventricular ejection fraction, by estimation, is 60 to 65%. The left ventricle has normal function. The left ventricle has no regional wall motion abnormalities. The left ventricular internal cavity size was normal in size. There is  moderate left ventricular hypertrophy. Left ventricular diastolic parameters are consistent with age-related delayed relaxation (normal). Right Ventricle: The right ventricular size is normal. No increase in right ventricular wall thickness. Right ventricular systolic function is  normal. Left Atrium: Left atrial size was normal in size. Right Atrium: Right atrial size was not well visualized. Pericardium: There is no evidence of pericardial effusion. Mitral Valve: The mitral valve is normal in structure. There is mild thickening of the mitral valve leaflet(s). There is mild calcification of the mitral valve leaflet(s). Normal mobility of the mitral valve leaflets. Trivial mitral valve regurgitation. No evidence of mitral valve stenosis. Tricuspid Valve: The tricuspid valve is normal in structure. Tricuspid valve regurgitation is mild . No evidence of tricuspid stenosis. Aortic Valve: The aortic valve is tricuspid. Aortic valve regurgitation is not visualized. Mild to moderate aortic valve sclerosis/calcification is present, without any evidence of aortic stenosis. Pulmonic Valve: The pulmonic valve was normal in structure. Pulmonic valve regurgitation is not visualized. No evidence of pulmonic stenosis. Aorta: The aortic root is normal in size and structure. Venous: The inferior vena cava is normal in size with greater than 50% respiratory variability, suggesting right atrial pressure of 3 mmHg. IAS/Shunts: No atrial level shunt detected by color flow Doppler.  LEFT VENTRICLE PLAX 2D LVIDd:         3.80 cm  Diastology LVIDs:         2.60 cm  LV e' lateral:   9.25 cm/s LV PW:         1.10 cm  LV E/e' lateral: 10.5 LV IVS:        1.40 cm  LV e' medial:    7.72 cm/s LVOT diam:     1.80  cm  LV E/e' medial:  12.5 LV SV:         77 LV SV Index:   38 LVOT Area:     2.54 cm  RIGHT VENTRICLE RV S prime:     12.80 cm/s TAPSE (M-mode): 2.1 cm LEFT ATRIUM             Index       RIGHT ATRIUM           Index LA diam:        3.50 cm 1.74 cm/m  RA Area:     15.70 cm LA Vol (A2C):   64.8 ml 32.15 ml/m RA Volume:   38.10 ml  18.90 ml/m LA Vol (A4C):   69.2 ml 34.34 ml/m LA Biplane Vol: 70.2 ml 34.83 ml/m  AORTIC VALVE LVOT Vmax:   123.00 cm/s LVOT Vmean:  93.700 cm/s LVOT VTI:    0.304 m  AORTA Ao  Root diam: 3.00 cm MITRAL VALVE MV Area (PHT): 2.87 cm     SHUNTS MV Decel Time: 264 msec     Systemic VTI:  0.30 m MV E velocity: 96.70 cm/s   Systemic Diam: 1.80 cm MV A velocity: 113.00 cm/s MV E/A ratio:  0.86 Jenkins Rouge MD Electronically signed by Jenkins Rouge MD Signature Date/Time: 12/12/2019/11:26:20 AM    Final       The results of significant diagnostics from this hospitalization (including imaging, microbiology, ancillary and laboratory) are listed below for reference.     Microbiology: Recent Results (from the past 240 hour(s))  Respiratory Panel by RT PCR (Flu A&B, Covid) - Nasopharyngeal Swab     Status: None   Collection Time: 12/12/19 12:56 AM   Specimen: Nasopharyngeal Swab  Result Value Ref Range Status   SARS Coronavirus 2 by RT PCR NEGATIVE NEGATIVE Final    Comment: (NOTE) SARS-CoV-2 target nucleic acids are NOT DETECTED. The SARS-CoV-2 RNA is generally detectable in upper respiratoy specimens during the acute phase of infection. The lowest concentration of SARS-CoV-2 viral copies this assay can detect is 131 copies/mL. A negative result does not preclude SARS-Cov-2 infection and should not be used as the sole basis for treatment or other patient management decisions. A negative result may occur with  improper specimen collection/handling, submission of specimen other than nasopharyngeal swab, presence of viral mutation(s) within the areas targeted by this assay, and inadequate number of viral copies (<131 copies/mL). A negative result must be combined with clinical observations, patient history, and epidemiological information. The expected result is Negative. Fact Sheet for Patients:  PinkCheek.be Fact Sheet for Healthcare Providers:  GravelBags.it This test is not yet ap proved or cleared by the [place] FDA and  has been authorized for detection and/or diagnosis of SARS-CoV-2 by FDA under an  Emergency Use Authorization (EUA). This EUA will remain  in effect (meaning this test can be used) for the duration of the COVID-19 declaration under Section 564(b)(1) of the Act, 21 U.S.C. section 360bbb-3(b)(1), unless the authorization is terminated or revoked sooner.    Influenza A by PCR NEGATIVE NEGATIVE Final   Influenza B by PCR NEGATIVE NEGATIVE Final    Comment: (NOTE) The Xpert Xpress SARS-CoV-2/FLU/RSV assay is intended as an aid in  the diagnosis of influenza from Nasopharyngeal swab specimens and  should not be used as a sole basis for treatment. Nasal washings and  aspirates are unacceptable for Xpert Xpress SARS-CoV-2/FLU/RSV  testing. Fact Sheet for Patients: PinkCheek.be Fact Sheet for Healthcare Providers:  GravelBags.it This test is not yet approved or cleared by the [place] and  has been authorized for detection and/or diagnosis of SARS-CoV-2 by  FDA under an Emergency Use Authorization (EUA). This EUA will remain  in effect (meaning this test can be used) for the duration of the  Covid-19 declaration under Section 564(b)(1) of the Act, 21  U.S.C. section 360bbb-3(b)(1), unless the authorization is  terminated or revoked. Performed at [facility], [place] [address] [place], [place] [address]      Labs: BNP (last 3 results) No results for input(s): BNP in the last 8760 hours. Basic Metabolic Panel: Recent Labs  Lab 12/11/19 2211 12/12/19 0423 12/13/19 0201  NA 141  --  138  K 4.2  --  4.3  CL 105  --  104  CO2 26  --  27  GLUCOSE 130*  --  109*  BUN 19  --  16  CREATININE 0.86  --  0.82  CALCIUM 9.5  --  9.0  MG  --  1.9 1.8   Liver Function Tests: No results for input(s): AST, ALT, ALKPHOS, BILITOT, PROT, ALBUMIN in the last 168 hours. No results for input(s): LIPASE, AMYLASE in the last 168 hours. No results for input(s): AMMONIA in the last 168 hours. CBC: Recent Labs   Lab 12/11/19 2211 12/13/19 0201  WBC 4.0 3.7*  NEUTROABS 2.2  --   HGB 11.1* 10.3*  HCT 36.0 33.2*  MCV 93.3 92.7  PLT 186 142*   Cardiac Enzymes: No results for input(s): CKTOTAL, CKMB, CKMBINDEX, TROPONINI in the last 168 hours. BNP: Invalid input(s): POCBNP CBG: Recent Labs  Lab 12/12/19 0344 12/13/19 0654  GLUCAP 112* 104*   D-Dimer No results for input(s): DDIMER in the last 72 hours. Hgb A1c No results for input(s): HGBA1C in the last 72 hours. Lipid Profile No results for input(s): CHOL, HDL, LDLCALC, TRIG, CHOLHDL, LDLDIRECT in the last 72 hours. Thyroid function studies Recent Labs    12/12/19 0423  TSH 0.949   Anemia work up No results for input(s): VITAMINB12, FOLATE, FERRITIN, TIBC, IRON, RETICCTPCT in the last 72 hours. Urinalysis    Component Value Date/Time   COLORURINE YELLOW 03/08/2008 1635   APPEARANCEUR CLEAR 03/08/2008 1635   LABSPEC >=1.030 12/20/2013 1313   PHURINE 5.5 12/20/2013 1313   GLUCOSEU NEGATIVE 12/20/2013 1313   HGBUR LARGE (A) 12/20/2013 1313   BILIRUBINUR N 12/27/2018 1427   KETONESUR TRACE (A) 12/20/2013 1313   PROTEINUR Negative 12/27/2018 1427   PROTEINUR >=300 (A) 12/20/2013 1313   UROBILINOGEN 0.2 12/27/2018 1427   UROBILINOGEN 1.0 12/20/2013 1313   NITRITE N 12/27/2018 1427   NITRITE POSITIVE (A) 12/20/2013 1313   LEUKOCYTESUR Negative 12/27/2018 1427   Sepsis Labs Invalid input(s): PROCALCITONIN,  WBC,  LACTICIDVEN Microbiology Recent Results (from the past 240 hour(s))  Respiratory Panel by RT PCR (Flu A&B, Covid) - Nasopharyngeal Swab     Status: None   Collection Time: 12/12/19 12:56 AM   Specimen: Nasopharyngeal Swab  Result Value Ref Range Status   SARS Coronavirus 2 by RT PCR NEGATIVE NEGATIVE Final    Comment: (NOTE) SARS-CoV-2 target nucleic acids are NOT DETECTED. The SARS-CoV-2 RNA is generally detectable in upper respiratoy specimens during the acute phase of infection. The lowest concentration of  SARS-CoV-2 viral copies this assay can detect is 131 copies/mL. A negative result does not preclude SARS-Cov-2 infection and should not be used as the sole basis for treatment or other  patient management decisions. A negative result may occur with  improper specimen collection/handling, submission of specimen other than nasopharyngeal swab, presence of viral mutation(s) within the areas targeted by this assay, and inadequate number of viral copies (<131 copies/mL). A negative result must be combined with clinical observations, patient history, and epidemiological information. The expected result is Negative. Fact Sheet for Patients:  PinkCheek.be Fact Sheet for Healthcare Providers:  GravelBags.it This test is not yet ap proved or cleared by the [place] FDA and  has been authorized for detection and/or diagnosis of SARS-CoV-2 by FDA under an Emergency Use Authorization (EUA). This EUA will remain  in effect (meaning this test can be used) for the duration of the COVID-19 declaration under Section 564(b)(1) of the Act, 21 U.S.C. section 360bbb-3(b)(1), unless the authorization is terminated or revoked sooner.    Influenza A by PCR NEGATIVE NEGATIVE Final   Influenza B by PCR NEGATIVE NEGATIVE Final    Comment: (NOTE) The Xpert Xpress SARS-CoV-2/FLU/RSV assay is intended as an aid in  the diagnosis of influenza from Nasopharyngeal swab specimens and  should not be used as a sole basis for treatment. Nasal washings and  aspirates are unacceptable for Xpert Xpress SARS-CoV-2/FLU/RSV  testing. Fact Sheet for Patients: PinkCheek.be Fact Sheet for Healthcare Providers: GravelBags.it This test is not yet approved or cleared by the [place] FDA and  has been authorized for detection and/or diagnosis of SARS-CoV-2 by  FDA under an Emergency Use Authorization (EUA).  This EUA will remain  in effect (meaning this test can be used) for the duration of the  Covid-19 declaration under Section 564(b)(1) of the Act, 21  U.S.C. section 360bbb-3(b)(1), unless the authorization is  terminated or revoked. Performed at [facility], [place] [address], [place], [place] [address]      Time coordinating discharge:  I have spent 35 minutes face to face with the patient and on the ward discussing the patients care, assessment, plan and disposition with other care givers. >50% of the time was devoted counseling the patient about the risks and benefits of treatment/Discharge disposition and coordinating care.   SIGNED:   Damita Lack, MD  [facility] 12/13/2019, 10:15 AM   If 7PM-7AM, please contact night-coverage

## 2019-12-15 LAB — GLUCOSE, CAPILLARY: Glucose-Capillary: 96 mg/dL (ref 70–99)

## 2019-12-22 ENCOUNTER — Ambulatory Visit: Payer: Medicare Other | Admitting: Surgical

## 2019-12-22 ENCOUNTER — Encounter: Payer: Self-pay | Admitting: Surgical

## 2019-12-22 ENCOUNTER — Other Ambulatory Visit: Payer: Self-pay

## 2019-12-22 DIAGNOSIS — M1711 Unilateral primary osteoarthritis, right knee: Secondary | ICD-10-CM

## 2019-12-22 MED ORDER — METHYLPREDNISOLONE ACETATE 40 MG/ML IJ SUSP
40.0000 mg | INTRAMUSCULAR | Status: AC | PRN
  Administered 2019-12-22: 40 mg via INTRA_ARTICULAR

## 2019-12-22 MED ORDER — LIDOCAINE HCL 1 % IJ SOLN
5.0000 mL | INTRAMUSCULAR | Status: AC | PRN
  Administered 2019-12-22: 5 mL

## 2019-12-22 MED ORDER — BUPIVACAINE HCL 0.25 % IJ SOLN
4.0000 mL | INTRAMUSCULAR | Status: AC | PRN
  Administered 2019-12-22: 4 mL via INTRA_ARTICULAR

## 2019-12-22 NOTE — Progress Notes (Signed)
Office Visit Note   Patient: [name]           Date of Birth: 1946-07-22           MRN: [id number] Visit Date: 12/22/2019 Requested by: Jilda Panda, MD [id number] [place],  [place] [address] PCP: Jilda Panda, MD  Subjective: Chief Complaint  Patient presents with  . Right Knee - Pain    HPI: [name] is a 74 y.o. female who presents to the office complaining of right knee pain.  Patient fell on 12/12/2019.  She dislocated her right elbow which was reduced under anesthesia by Dr. Doreatha Martin.  Since this fall, she complains of worsening right knee pain.  She has been able to weight-bear with the brace but notes that she has increased pain with weightbearing.  She denies any mechanical symptoms.  She denies any groin pain.  Localizes the majority of her pain to the medial aspect of the right knee.  She had x-rays during her visit to the hospital that were negative for any right knee injury.  She does have a long history of severe right knee osteoarthritis..                ROS:  All systems reviewed are negative as they relate to the chief complaint within the history of present illness.  Patient denies fevers or chills.  Assessment & Plan: Visit Diagnoses:  1. Unilateral primary osteoarthritis, right knee     Plan: Patient is a 74 year old female presents complaint of right knee pain.  She has right knee pain since a fall in April.  She has a history of right knee osteoarthritis for which she has been treated with right knee injections.  Impression is right knee acute exacerbation of osteoarthritis.  She has tolerated these well in the past with good relief.  She requests 1 today.  Right knee injection administered and patient tolerated the procedure well.  She will follow-up with the office as needed.  Follow-Up Instructions: No follow-ups on file.   Orders:  No orders of the defined types were placed in this encounter.  No orders of the defined types were placed  in this encounter.     Procedures: Large Joint Inj: R knee on 12/22/2019 2:51 PM Indications: diagnostic evaluation, joint swelling and pain Details: 18 G 1.5 in needle, superolateral approach  Arthrogram: No  Medications: 5 mL lidocaine 1 %; 40 mg methylPREDNISolone acetate 40 MG/ML; 4 mL bupivacaine 0.25 % Outcome: tolerated well, no immediate complications Procedure, treatment alternatives, risks and benefits explained, specific risks discussed. Consent was given by the patient. Immediately prior to procedure a time out was called to verify the correct patient, procedure, equipment, support staff and site/side marked as required. Patient was prepped and draped in the usual sterile fashion.       Clinical Data: No additional findings.  Objective: Vital Signs: LMP 08/14/1985 (Approximate)   Physical Exam:  Constitutional: Patient appears well-developed HEENT:  Head: Normocephalic Eyes:EOM are normal Neck: Normal range of motion Cardiovascular: Normal rate Pulmonary/chest: Effort normal Neurologic: Patient is alert Skin: Skin is warm Psychiatric: Patient has normal mood and affect  Ortho Exam:  Right knee Exam Tender to palpation over the medial joint line and the medial aspect of the right patella No effusion Extensor mechanism intact No TTP over the lateral jointlines, quad tendon, patellar tendon, pes anserinus, tibial tubercle, LCL/MCL insertions Stable to anterior/posterior drawer Extension to 0 degrees Flexion > 90 degrees  Specialty Comments:  No specialty comments available.  Imaging: No results found.   PMFS History: Patient Active Problem List   Diagnosis Date Noted  . Vaso vagal episode 12/12/2019  . Closed posterior dislocation of right elbow 12/12/2019  . Essential hypertension 12/12/2019  . History of kidney disease 06/11/2019  . Prediabetes   . Status post foot surgery 06/27/2013  . Other hammer toe (acquired) 06/10/2013  . Pain in foot  06/10/2013  . Onychomycosis 06/10/2013   Past Medical History:  Diagnosis Date  . COPD (chronic obstructive pulmonary disease) ([place])   . Glaucoma   . History of kidney disease   . History of kidney disease 06/11/2019  . History of recurrent UTIs   . Hyperlipidemia   . Hypertension   . Lichen sclerosus January 2017   vulva/perianal region  . Non-melanoma skin cancer 08/2015   right leg  . Osteoporosis   . Post-menopausal    . Prediabetes     Family History  Problem Relation Age of Onset  . Breast cancer Mother        deceased -50's  . Heart attack Father   . Heart attack Brother     Past Surgical History:  Procedure Laterality Date  . CLOSED REDUCTION ELBOW FRACTURE Right 12/12/2019   Procedure: CLOSED REDUCTION RIGHT ELBOW;  Surgeon: Shona Needles, MD;  Location: [place];  Service: Orthopedics;  Laterality: Right;  . EYE SURGERY Right    Stent placement  . FOOT SURGERY Right 06/2013   right 2nd toe  . HEMATOMA EVACUATION Left 2007   left calf - excision of hemangioma with thrombus   Social History   Occupational History  . Not on file  Tobacco Use  . Smoking status: Never Smoker  . Smokeless tobacco: Never Used  Substance and Sexual Activity  . Alcohol use: Not Currently  . Drug use: No  . Sexual activity: Not Currently    Birth control/protection: Post-menopausal

## 2020-01-21 ENCOUNTER — Ambulatory Visit: Payer: Self-pay | Admitting: Family Medicine

## 2020-02-13 ENCOUNTER — Telehealth: Payer: Self-pay | Admitting: Orthopedic Surgery

## 2020-02-13 NOTE — Telephone Encounter (Signed)
Pt called stat she got voltraren tropical gel over the counter and wanted to make sure it would be ok to use on her neck? Pt would like a CB to discuss.    [phone]

## 2020-02-13 NOTE — Telephone Encounter (Signed)
yes

## 2020-02-13 NOTE — Telephone Encounter (Signed)
Ok for this? 

## 2020-02-17 NOTE — Telephone Encounter (Signed)
I called patient and advised. She states that she has used it some over the weekend after speaking to pharmacist and it has helped a lot.

## 2020-03-04 ENCOUNTER — Other Ambulatory Visit: Payer: Self-pay

## 2020-03-04 ENCOUNTER — Ambulatory Visit
Admission: RE | Admit: 2020-03-04 | Discharge: 2020-03-04 | Disposition: A | Discharge status: Home / Self Care | Payer: Medicare Other | Source: Ambulatory Visit | Attending: Internal Medicine | Admitting: Internal Medicine

## 2020-03-04 ENCOUNTER — Other Ambulatory Visit: Payer: Self-pay | Admitting: Internal Medicine

## 2020-03-04 DIAGNOSIS — R059 Cough, unspecified: Secondary | ICD-10-CM

## 2020-03-04 DIAGNOSIS — R062 Wheezing: Secondary | ICD-10-CM

## 2020-03-04 IMAGING — CR DG CHEST 2V
2 series · 2 of 2 positions shown · non-contrast
Comparison: [DATE]

CLINICAL DATA: Cough and wheezing.

EXAM:
CHEST - 2 VIEW

[w chest pa]
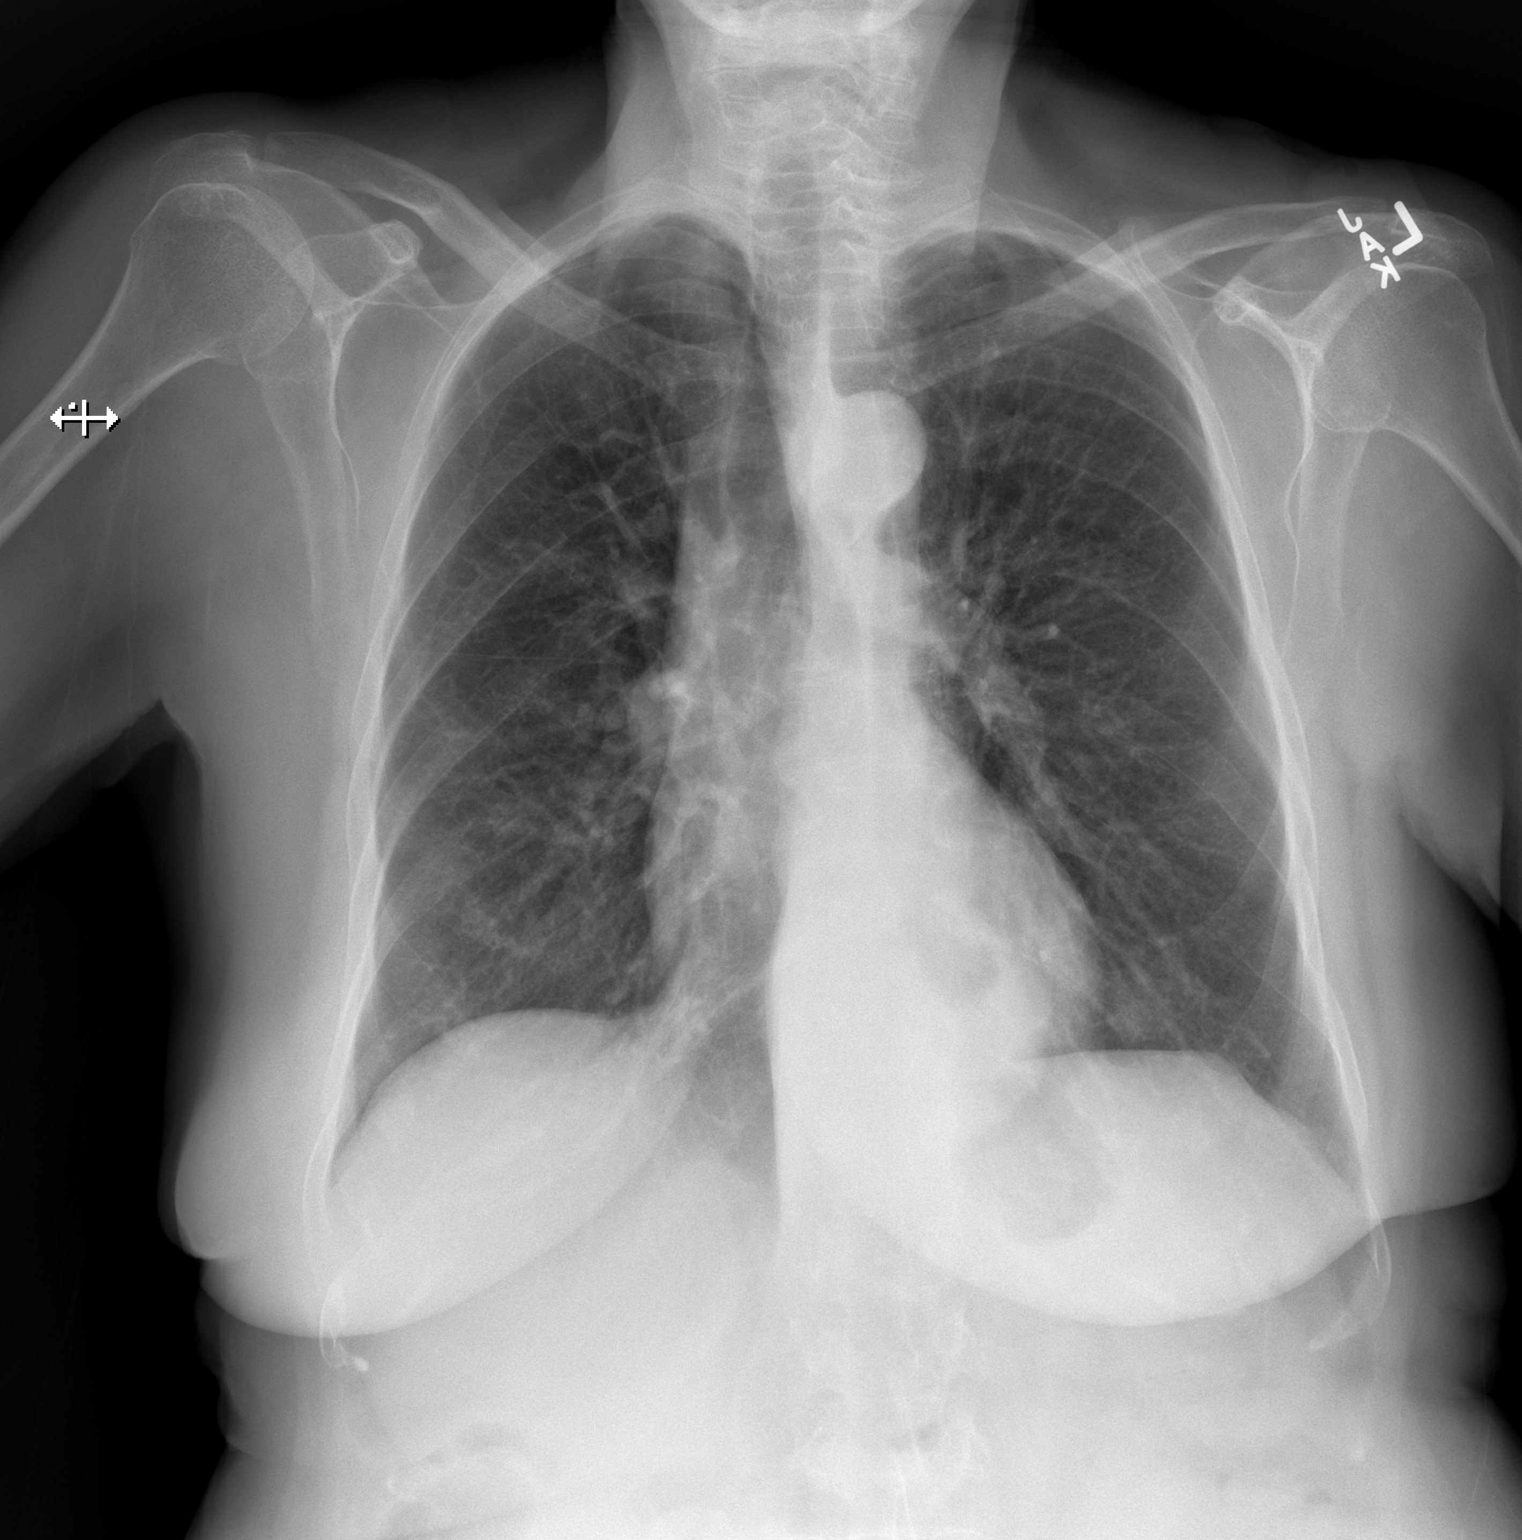

[w chest lat]
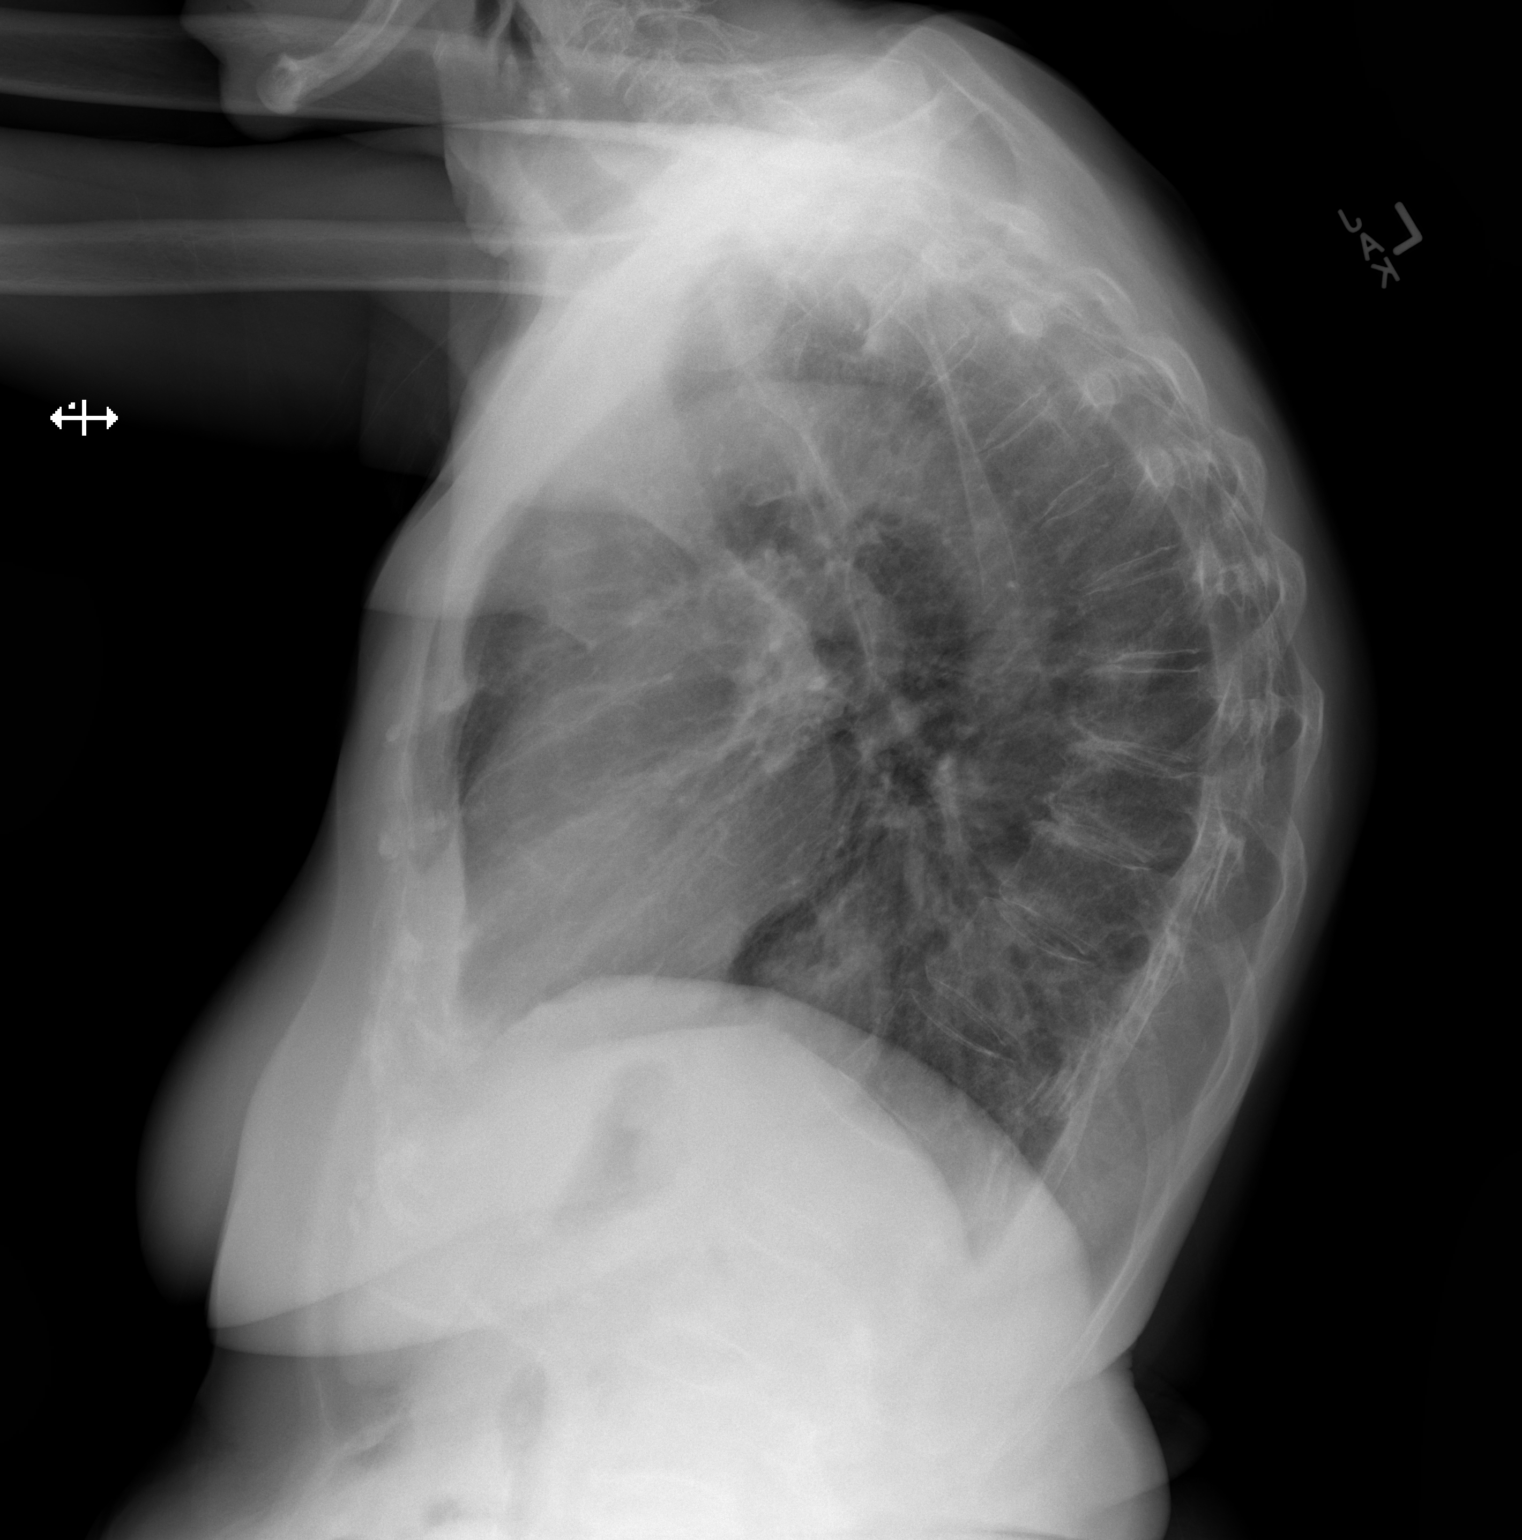

[2 of 2 positions shown; findings below may reference images not displayed]

FINDINGS: The lungs are hyperinflated. There is no evidence of acute
infiltrate, pleural effusion or pneumothorax. The heart size and
mediastinal contours are within normal limits. There is a small to
moderate sized hiatal hernia. This is seen on the prior study.
Degenerative changes seen within the thoracic spine.
IMPRESSION: 1. No active cardiopulmonary disease.
2. Small to moderate sized hiatal hernia.

## 2020-04-05 ENCOUNTER — Other Ambulatory Visit: Payer: Self-pay | Admitting: Internal Medicine

## 2020-04-05 DIAGNOSIS — Z1231 Encounter for screening mammogram for malignant neoplasm of breast: Secondary | ICD-10-CM

## 2020-04-30 ENCOUNTER — Ambulatory Visit: Payer: Medicare Other | Admitting: Surgical

## 2020-05-10 ENCOUNTER — Ambulatory Visit: Payer: Medicare Other | Admitting: Orthopedic Surgery

## 2020-05-13 ENCOUNTER — Ambulatory Visit (INDEPENDENT_AMBULATORY_CARE_PROVIDER_SITE_OTHER): Payer: Medicare Other | Admitting: Family Medicine

## 2020-05-13 ENCOUNTER — Encounter: Payer: Self-pay | Admitting: Family Medicine

## 2020-05-13 ENCOUNTER — Other Ambulatory Visit: Payer: Self-pay

## 2020-05-13 DIAGNOSIS — M25561 Pain in right knee: Secondary | ICD-10-CM

## 2020-05-13 NOTE — Progress Notes (Signed)
Office Visit Note   Patient: [name]           Date of Birth: 1945-11-19           MRN: [id number] Visit Date: 05/13/2020 Requested by: Jilda Panda, MD [address] [place],  [place] [address] PCP: Jilda Panda, MD  Subjective: Chief Complaint  Patient presents with  . Right Knee - Pain    HPI: She is here with a flareup of right knee pain.  History of osteoarthritis.  Last injection in May helped until couple months ago.  She also would like a new hinged knee brace.  She has lost a lot of weight and her old one does not fit anymore.              ROS:   All other systems were reviewed and are negative.  Objective: Vital Signs: LMP 08/14/1985 (Approximate)   Physical Exam:  General:  Alert and oriented, in no acute distress. Pulm:  Breathing unlabored. Psy:  Normal mood, congruent affect. Skin: No erythema Right knee: Trace effusion with no warmth.  Imaging: No results found.  Assessment & Plan: 1.  Right knee osteoarthritis -Steroid injection today.  New brace given.  Follow-up as needed.     Procedures: Right knee steroid injection: After sterile prep with Betadine, injected 3 cc 1% lidocaine without epinephrine and 40 mg methylprednisolone from lateral midpatellar approach.  A flash of clear yellow synovial fluid was obtained prior to injection.    PMFS History: Patient Active Problem List   Diagnosis Date Noted  . Vaso vagal episode 12/12/2019  . Closed posterior dislocation of right elbow 12/12/2019  . Essential hypertension 12/12/2019  . History of kidney disease 06/11/2019  . Prediabetes   . Status post foot surgery 06/27/2013  . Other hammer toe (acquired) 06/10/2013  . Pain in foot 06/10/2013  . Onychomycosis 06/10/2013   Past Medical History:  Diagnosis Date  . COPD (chronic obstructive pulmonary disease) ([place])   . Glaucoma   . History of kidney disease   . History of kidney disease 06/11/2019  . History of recurrent UTIs   .  Hyperlipidemia   . Hypertension   . Lichen sclerosus January 2017   vulva/perianal region  . Non-melanoma skin cancer 08/2015   right leg  . Osteoporosis   . Post-menopausal    . Prediabetes     Family History  Problem Relation Age of Onset  . Breast cancer Mother        deceased -64's  . Heart attack Father   . Heart attack Brother     Past Surgical History:  Procedure Laterality Date  . CLOSED REDUCTION ELBOW FRACTURE Right 12/12/2019   Procedure: CLOSED REDUCTION RIGHT ELBOW;  Surgeon: Shona Needles, MD;  Location: [place];  Service: Orthopedics;  Laterality: Right;  . EYE SURGERY Right    Stent placement  . FOOT SURGERY Right 06/2013   right 2nd toe  . HEMATOMA EVACUATION Left 2007   left calf - excision of hemangioma with thrombus   Social History   Occupational History  . Not on file  Tobacco Use  . Smoking status: Never Smoker  . Smokeless tobacco: Never Used  Vaping Use  . Vaping Use: Never used  Substance and Sexual Activity  . Alcohol use: Not Currently  . Drug use: No  . Sexual activity: Not Currently    Birth control/protection: Post-menopausal

## 2020-05-17 ENCOUNTER — Other Ambulatory Visit: Payer: Self-pay

## 2020-05-17 ENCOUNTER — Ambulatory Visit
Admission: RE | Admit: 2020-05-17 | Discharge: 2020-05-17 | Disposition: A | Discharge status: Home / Self Care | Payer: Medicare Other | Source: Ambulatory Visit | Attending: Internal Medicine | Admitting: Internal Medicine

## 2020-05-17 DIAGNOSIS — Z1231 Encounter for screening mammogram for malignant neoplasm of breast: Secondary | ICD-10-CM

## 2020-05-17 IMAGING — MG DIGITAL SCREENING BILAT W/ TOMO W/ CAD
8 series · 8 of 24 positions shown · non-contrast
Comparison: Previous exam(s).

CLINICAL DATA: Screening.

EXAM:
DIGITAL SCREENING BILATERAL MAMMOGRAM WITH TOMO AND CAD

[L CC synth-2D]
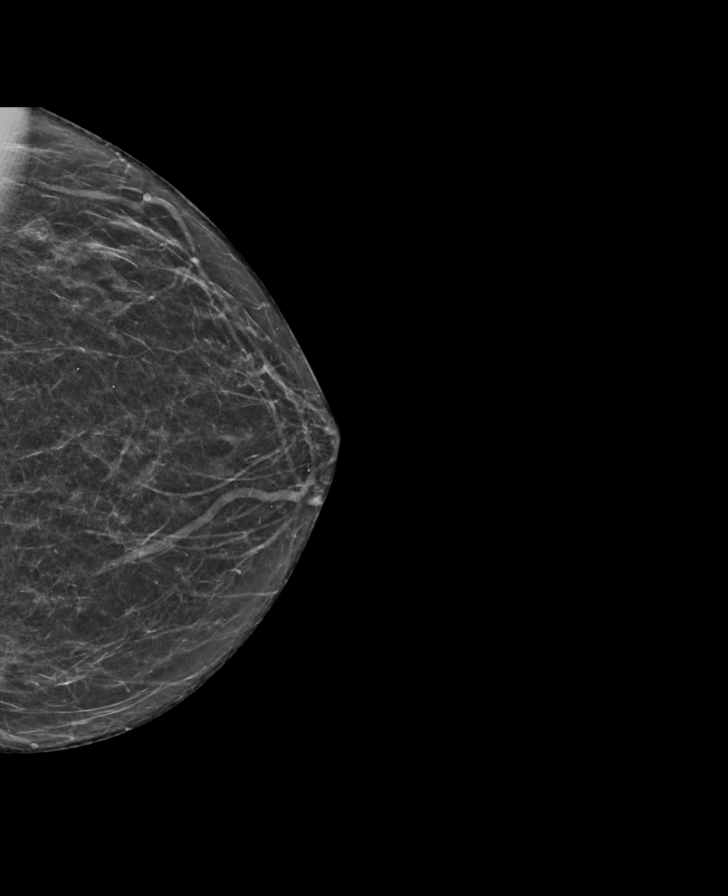

[R MLO synth-2D]
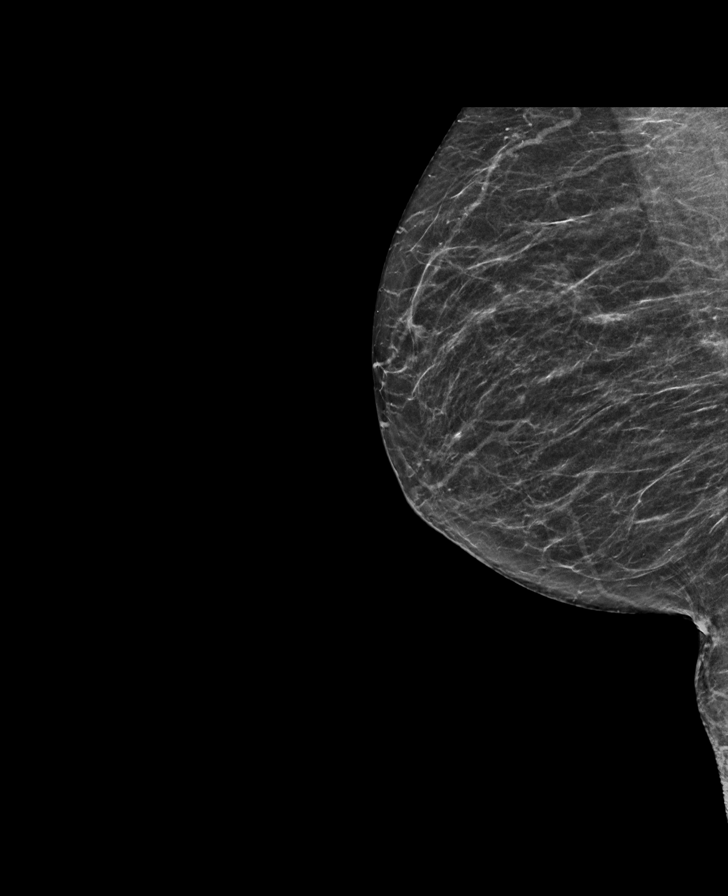

[L MLO synth-2D]
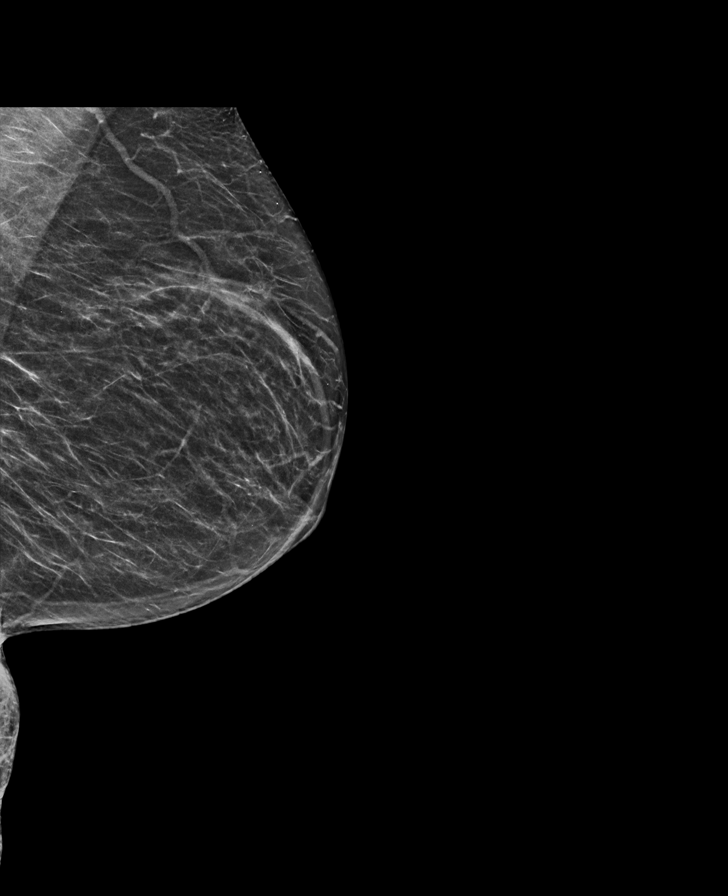

[R CC synth-2D]
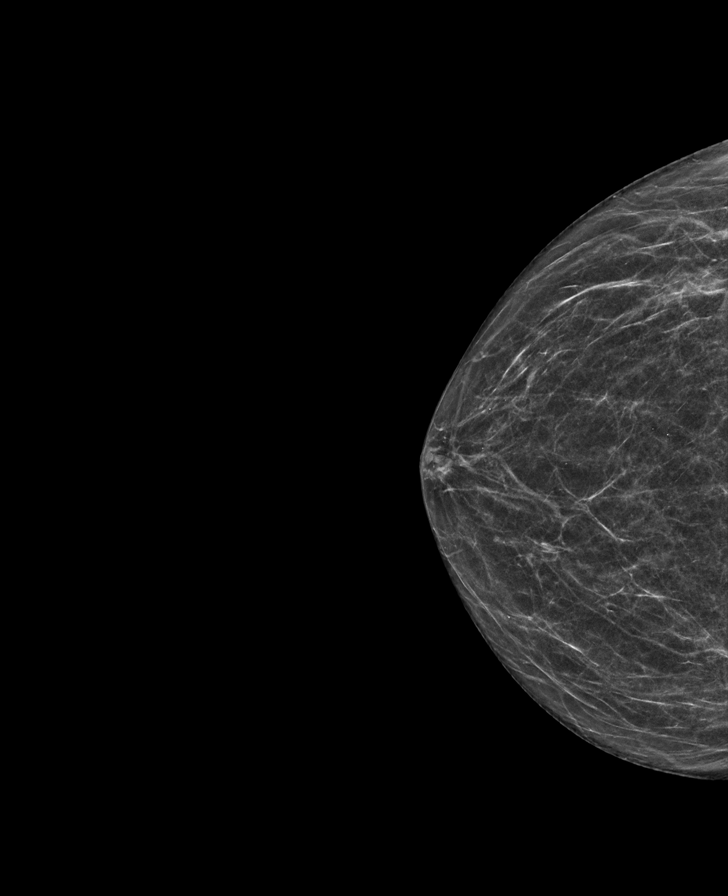

[L MLO tomo · tomo slice 31/61.0]
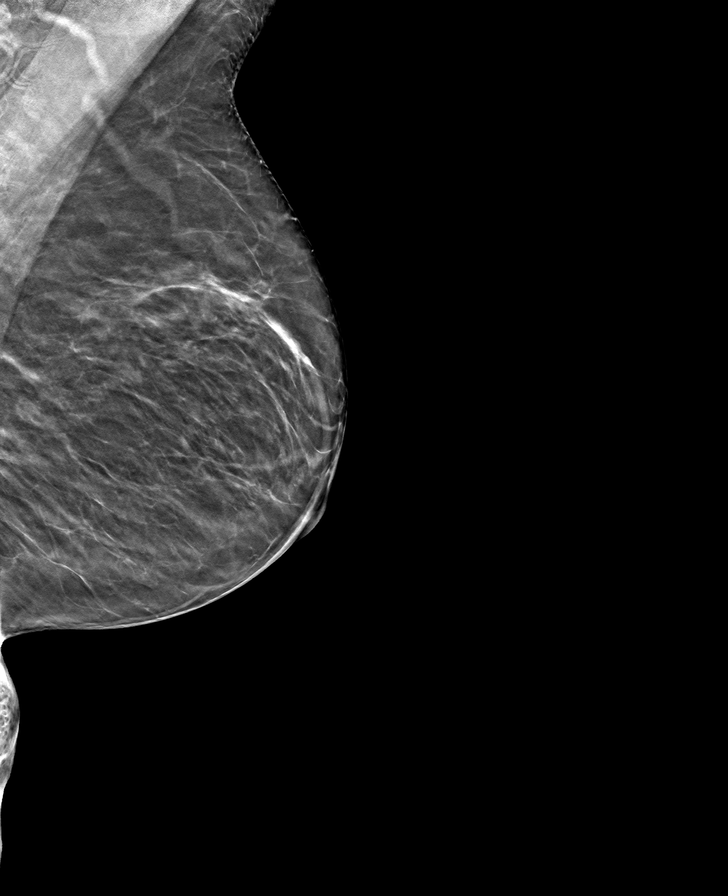

[L CC tomo · tomo slice 27/53.0]
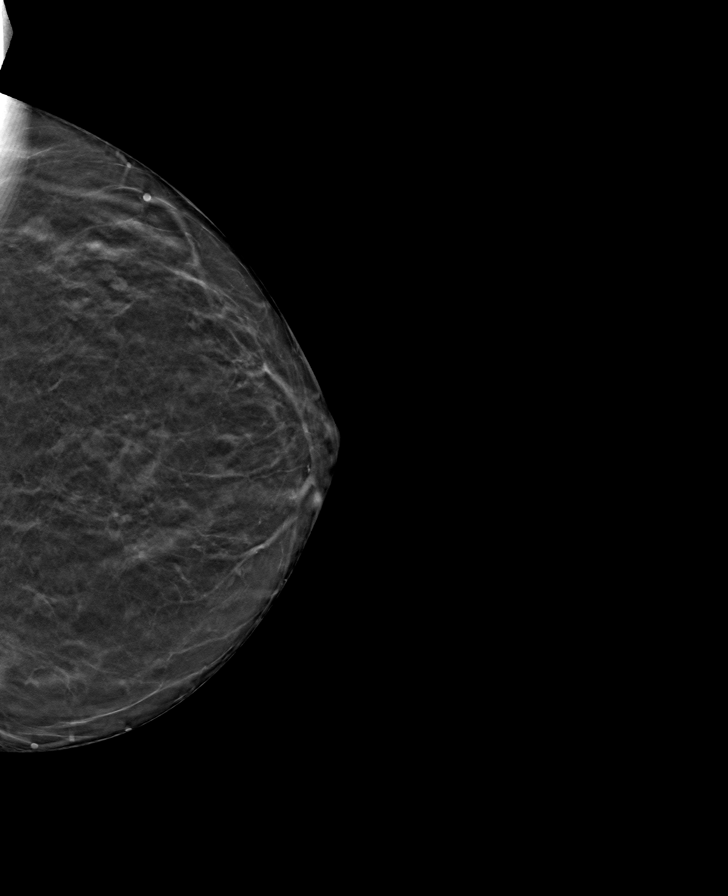

[R MLO tomo · tomo slice 29/57.0]
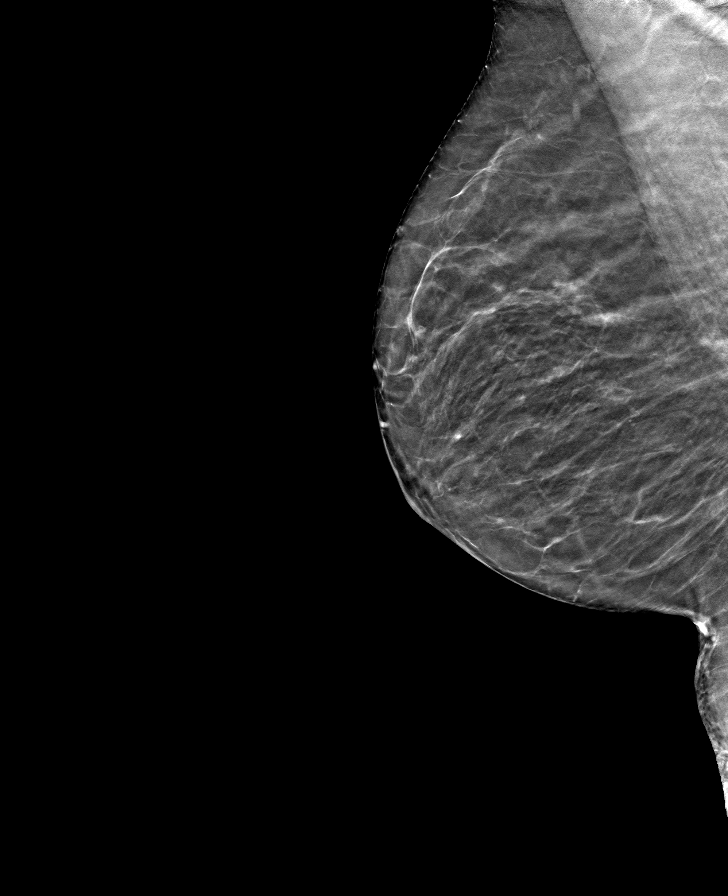

[R CC tomo · tomo slice 27/52.0]
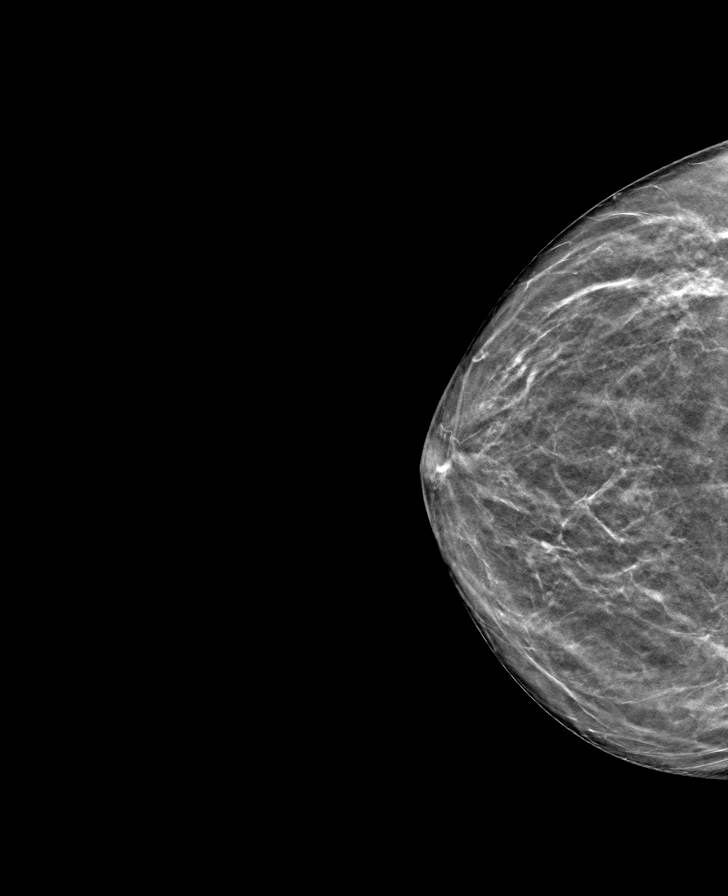

[8 of 24 positions shown; findings below may reference images not displayed]

ACR Breast Density Category b: There are scattered areas of
fibroglandular density.
FINDINGS: There are no findings suspicious for malignancy. Images were
processed with CAD.
IMPRESSION: No mammographic evidence of malignancy. A result letter of this
screening mammogram will be mailed directly to the patient.

RECOMMENDATION:
Screening mammogram in one year. (Code:[TQ])

BI-RADS CATEGORY  1: Negative.

## 2020-05-20 ENCOUNTER — Ambulatory Visit: Payer: Medicare Other | Admitting: Obstetrics and Gynecology

## 2020-05-20 ENCOUNTER — Other Ambulatory Visit: Payer: Self-pay

## 2020-05-20 ENCOUNTER — Encounter: Payer: Self-pay | Admitting: Obstetrics and Gynecology

## 2020-05-20 VITALS — BP 132/70 | HR 78 | Ht 65.0 in | Wt 184.0 lb

## 2020-05-20 DIAGNOSIS — L900 Lichen sclerosus et atrophicus: Secondary | ICD-10-CM

## 2020-05-20 DIAGNOSIS — R151 Fecal smearing: Secondary | ICD-10-CM | POA: Diagnosis not present

## 2020-05-20 DIAGNOSIS — L309 Dermatitis, unspecified: Secondary | ICD-10-CM | POA: Diagnosis not present

## 2020-05-20 MED ORDER — BETAMETHASONE VALERATE 0.1 % EX OINT: TOPICAL_OINTMENT | CUTANEOUS | 1 refills | Status: DC

## 2020-05-20 NOTE — Progress Notes (Signed)
GYNECOLOGY  VISIT   HPI: 74 y.o.   Widowed White or Caucasian Not Hispanic or Latino  female   [phone] with Patient's last menstrual period was 08/14/1985 (approximate).   here for 6 month f/u of lichen sclerosis. She is on a special diet and it has helped with the vulvar problems and helped with weight loss. She found the diet on line for treatment of lichen sclerosis. Doesn't anything white.  She is only using the steroid ointment intermittently. She continues to have intermittent flares of perianal dermatitis. Uses Vaseline and A&D ointment which helps.  Has a regular BM qd with the miralax.      GYNECOLOGIC HISTORY: Patient's last menstrual period was 08/14/1985 (approximate). Contraception:Postmenopausal Menopausal hormone therapy: none        OB History    Gravida  6   Para  1   Term  1   Preterm  0   AB  5   Living  1     SAB  5   TAB  0   Ectopic  0   Multiple  0   Live Births  1              Patient Active Problem List   Diagnosis Date Noted  . Vaso vagal episode 12/12/2019  . Closed posterior dislocation of right elbow 12/12/2019  . Essential hypertension 12/12/2019  . History of kidney disease 06/11/2019  . Prediabetes   . Status post foot surgery 06/27/2013  . Other hammer toe (acquired) 06/10/2013  . Pain in foot 06/10/2013  . Onychomycosis 06/10/2013    Past Medical History:  Diagnosis Date  . COPD (chronic obstructive pulmonary disease) ([place])   . Glaucoma   . History of kidney disease   . History of kidney disease 06/11/2019  . History of recurrent UTIs   . Hyperlipidemia   . Hypertension   . Lichen sclerosus January 2017   vulva/perianal region  . Non-melanoma skin cancer 08/2015   right leg  . Osteoporosis   . Post-menopausal    . Prediabetes     Past Surgical History:  Procedure Laterality Date  . CLOSED REDUCTION ELBOW FRACTURE Right 12/12/2019   Procedure: CLOSED REDUCTION RIGHT ELBOW;  Surgeon: Shona Needles, MD;   Location: [place];  Service: Orthopedics;  Laterality: Right;  . EYE SURGERY Right    Stent placement  . FOOT SURGERY Right 06/2013   right 2nd toe  . HEMATOMA EVACUATION Left 2007   left calf - excision of hemangioma with thrombus    Current Outpatient Medications  Medication Sig Dispense Refill  . albuterol (PROAIR HFA) 108 (90 BASE) MCG/ACT inhaler Inhale 2 puffs into the lungs every 6 (six) hours as needed for wheezing or shortness of breath.    Marland Kitchen aspirin 81 MG tablet Take 81 mg by mouth daily.    Marland Kitchen atorvastatin (LIPITOR) 20 MG tablet Take 1 tablet by mouth at bedtime.  11  . budesonide-formoterol (SYMBICORT) 160-4.5 MCG/ACT inhaler Inhale 2 puffs into the lungs daily.     . calcium carbonate (OS-CAL) 600 MG TABS tablet Take 600 mg by mouth 2 (two) times daily with a meal.    . Cetirizine HCl 10 MG CAPS Take 1 capsule (10 mg total) by mouth daily for 10 days. 10 capsule 0  . Cholecalciferol (VITAMIN D-3) 1000 UNITS CAPS Take 1,000 Units by mouth daily.     Marland Kitchen lidocaine (XYLOCAINE) 5 % ointment Apply 1 application topically 4 (four) times daily  as needed. (Patient taking differently: Apply 1 application topically 4 (four) times daily as needed for mild pain. ) 30 g 0  . losartan (COZAAR) 100 MG tablet Take 100 mg by mouth daily.     . Misc Natural Products (FOCUSED MIND PO) Take 1 tablet by mouth daily.     . Multiple Vitamins-Minerals (ZINC PO) Take 1 tablet by mouth daily.     . polyethylene glycol (MIRALAX / GLYCOLAX) 17 g packet Take 17 g by mouth daily.     Marland Kitchen senna-docusate (SENOKOT-S) 8.6-50 MG tablet Take 2 tablets by mouth at bedtime as needed for mild constipation or moderate constipation. 15 tablet 0  . vitamin C (ASCORBIC ACID) 250 MG tablet Take 250 mg by mouth daily.    Marland Kitchen aspirin-acetaminophen-caffeine (EXCEDRIN MIGRAINE) 250-250-65 MG tablet Take 1 tablet by mouth as needed for headache. (Patient not taking: Reported on 05/20/2020)    . betamethasone valerate ointment  (VALISONE) 0.1 % Apply a pea sized amount 3 x a week. Can increase to BID for up to 1-2 weeks as needed (Patient not taking: Reported on 12/11/2019) 30 g 0  . Coenzyme Q10 (COQ-10 PO) Take 1 tablet by mouth daily.  (Patient not taking: Reported on 05/20/2020)    . diclofenac sodium (VOLTAREN) 1 % GEL Apply 2 g topically 4 (four) times daily.  (Patient not taking: Reported on 05/20/2020)    . gabapentin (NEURONTIN) 100 MG capsule Take 1 capsule (100 mg total) by mouth at bedtime. (Patient not taking: Reported on 05/20/2020) 7 capsule 0  . Ubrogepant (UBRELVY) 50 MG TABS Take 50 mg by mouth as needed (may repeat once in 2 hours. no more than 2 pills in 24 h.). (Patient not taking: Reported on 05/20/2020) 10 tablet 3   No current facility-administered medications for this visit.     ALLERGIES: Contrast media [iodinated diagnostic agents], Ether, and Iohexol  Family History  Problem Relation Age of Onset  . Breast cancer Mother        deceased -57's  . Heart attack Father   . Heart attack Brother     Social History   Socioeconomic History  . Marital status: Widowed    Spouse name: Not on file  . Number of children: Not on file  . Years of education: Not on file  . Highest education level: Not on file  Occupational History  . Not on file  Tobacco Use  . Smoking status: Never Smoker  . Smokeless tobacco: Never Used  Vaping Use  . Vaping Use: Never used  Substance and Sexual Activity  . Alcohol use: Not Currently  . Drug use: No  . Sexual activity: Not Currently    Birth control/protection: Post-menopausal  Other Topics Concern  . Not on file  Social History Narrative  . Not on file   Social Determinants of Health   Financial Resource Strain:   . Difficulty of Paying Living Expenses: Not on file  Food Insecurity:   . Worried About Charity fundraiser in the Last Year: Not on file  . Ran Out of Food in the Last Year: Not on file  Transportation Needs:   . Lack of Transportation  (Medical): Not on file  . Lack of Transportation (Non-Medical): Not on file  Physical Activity:   . Days of Exercise per Week: Not on file  . Minutes of Exercise per Session: Not on file  Stress:   . Feeling of Stress : Not on file  Social Connections:   .  Frequency of Communication with Friends and Family: Not on file  . Frequency of Social Gatherings with Friends and Family: Not on file  . Attends Religious Services: Not on file  . Active Member of Clubs or Organizations: Not on file  . Attends Archivist Meetings: Not on file  . Marital Status: Not on file  Intimate Partner Violence:   . Fear of Current or Ex-Partner: Not on file  . Emotionally Abused: Not on file  . Physically Abused: Not on file  . Sexually Abused: Not on file    Review of Systems  Constitutional: Negative.   HENT: Negative.   Eyes: Negative.   Respiratory: Negative.   Cardiovascular: Negative.   Gastrointestinal: Negative.   Genitourinary: Negative.   Musculoskeletal: Negative.   Skin: Negative.   Neurological: Negative.   Endo/Heme/Allergies: Negative.   Psychiatric/Behavioral: Negative.     PHYSICAL EXAMINATION:    BP 132/70 (BP Location: Right Arm, Patient Position: Sitting, Cuff Size: Large)   Pulse 78   Ht 5\' 5"  (1.651 m)   Wt 184 lb (83.5 kg)   LMP 08/14/1985 (Approximate)   SpO2 97%   BMI 30.62 kg/m     General appearance: alert, cooperative and appears stated age   Pelvic: External genitalia:  no lesions, diffuse whitening, some loss of architecture. No lesions, no fissures, no plaques.               Urethra:  normal appearing urethra with no masses, tenderness or lesions              Bartholins and Skenes: normal                 Perianal skin with some erythema, some whitening, no fissures, no lesions. Some soft stool is present just external to the anus.   Chaperone was present for exam.  ASSESSMENT Lichen sclerosis Perianal dermatitis, some fecal staining   Discussed how irritating the stool is to the skin    PLAN Valisone ointment, use 1 x a week to the vulva and perianal region Discussed trying metamucil instead of miralax to bulk the stool and hopefully help with fecal staining.  Aware of vulvar skin care F/U in 6 months.

## 2020-05-26 ENCOUNTER — Encounter: Payer: Self-pay | Admitting: Orthopedic Surgery

## 2020-05-26 ENCOUNTER — Telehealth: Payer: Self-pay

## 2020-05-26 ENCOUNTER — Other Ambulatory Visit: Payer: Self-pay

## 2020-05-26 ENCOUNTER — Ambulatory Visit: Payer: Self-pay

## 2020-05-26 ENCOUNTER — Ambulatory Visit: Payer: Medicare Other | Admitting: Orthopedic Surgery

## 2020-05-26 DIAGNOSIS — M542 Cervicalgia: Secondary | ICD-10-CM | POA: Diagnosis not present

## 2020-05-26 DIAGNOSIS — M25561 Pain in right knee: Secondary | ICD-10-CM | POA: Diagnosis not present

## 2020-05-26 NOTE — Telephone Encounter (Signed)
Noted  

## 2020-05-26 NOTE — Progress Notes (Signed)
Office Visit Note   Patient: [name]           Date of Birth: [birth year]           MRN: [id number] Visit Date: 05/26/2020 Requested by: Jilda Panda, MD [id number] [place],  [place] [address] PCP: Jilda Panda, MD  Subjective: Chief Complaint  Patient presents with  . Right Knee - Pain  . Neck - Pain    HPI: Tyeasha is a 74 year old patient with right knee pain and neck pain.  She describes worsening pain.  Saw Dr. Junius Roads 05/13/2020 had injection.  Pain is worse now.  Taking Tylenol for symptoms.  She is getting her booster shot for Covid 1028 and does not want to have any injections 3 weeks on either side of that event.  She also reports cervical spine pain.  Worsening in the upper cervical spine region.  Difficulty sleeping.  She has had MRI scan which shows mild spondylosis as well as some C2-3 facet arthritis.  I think this upper level facet arthritis may be symptomatic for her.              ROS: All systems reviewed are negative as they relate to the chief complaint within the history of present illness.  Patient denies  fevers or chills.   Assessment & Plan: Visit Diagnoses:  1. Right knee pain, unspecified chronicity   2. Neck pain     Plan: Impression is right knee pain worsening since injection 05/13/2020.  We will preapproved for for Euflexxa to begin third week in November.  Also refer to Dr. Ernestina Patches for C-spine ESI as she is having significant pain in her neck but no definite operative pathology based on recent MRI scan.  No radiculopathy today.  Follow-Up Instructions: No follow-ups on file.   Orders:  Orders Placed This Encounter  Procedures  . XR Knee 1-2 Views Right  . Ambulatory referral to Physical Medicine Rehab   No orders of the defined types were placed in this encounter.     Procedures: No procedures performed   Clinical Data: No additional findings.  Objective: Vital Signs: LMP 08/14/1985 (Approximate)   Physical Exam:    Constitutional: Patient appears well-developed HEENT:  Head: Normocephalic Eyes:EOM are normal Neck: Normal range of motion Cardiovascular: Normal rate Pulmonary/chest: Effort normal Neurologic: Patient is alert Skin: Skin is warm Psychiatric: Patient has normal mood and affect    Ortho Exam: Ortho exam demonstrates some pain localizing to the base of her neck.  She has good cervical spine range of motion flexion extension rotation.  She has 5-5 grip EPL FPL interosseous wrist flexion extension bicep triceps and deltoid strength.  Both knees are examined.  No effusion in either knee.  Has actually pretty reasonable range of motion with good stability to varus valgus stress.  Primarily medial greater than lateral joint line tenderness but intact extensor mechanism palpable pedal pulses and no calf tenderness today.  Specialty Comments:  No specialty comments available.  Imaging: XR Knee 1-2 Views Right  Result Date: 05/26/2020 AP lateral right knee reviewed.  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  No acute fracture.  Bone quality slightly osteopenic.    PMFS History: Patient Active Problem List   Diagnosis Date Noted  . Vaso vagal episode 12/12/2019  . Closed posterior dislocation of right elbow 12/12/2019  . Essential hypertension 12/12/2019  . History of kidney disease 06/11/2019  . Prediabetes   . Status post foot  surgery 06/27/2013  . Other hammer toe (acquired) 06/10/2013  . Pain in foot 06/10/2013  . Onychomycosis 06/10/2013   Past Medical History:  Diagnosis Date  . COPD (chronic obstructive pulmonary disease) ([facility])   . Glaucoma   . History of kidney disease   . History of kidney disease 06/11/2019  . History of recurrent UTIs   . Hyperlipidemia   . Hypertension   . Lichen sclerosus January 2017   vulva/perianal region  . Non-melanoma skin cancer 08/2015   right leg  . Osteoporosis   . Post-menopausal    .  Prediabetes     Family History  Problem Relation Age of Onset  . Breast cancer Mother        deceased -26's  . Heart attack Father   . Heart attack Brother     Past Surgical History:  Procedure Laterality Date  . CLOSED REDUCTION ELBOW FRACTURE Right 12/12/2019   Procedure: CLOSED REDUCTION RIGHT ELBOW;  Surgeon: Shona Needles, MD;  Location: [facility];  Service: Orthopedics;  Laterality: Right;  . EYE SURGERY Right    Stent placement  . FOOT SURGERY Right 06/2013   right 2nd toe  . HEMATOMA EVACUATION Left 2007   left calf - excision of hemangioma with thrombus   Social History   Occupational History  . Not on file  Tobacco Use  . Smoking status: Never Smoker  . Smokeless tobacco: Never Used  Vaping Use  . Vaping Use: Never used  Substance and Sexual Activity  . Alcohol use: Not Currently  . Drug use: No  . Sexual activity: Not Currently    Birth control/protection: Post-menopausal

## 2020-05-26 NOTE — Telephone Encounter (Signed)
Need auth for right knee eulfexxa Dr Marlou Sa would like to have them approved to administer in November near Thanksgiving.

## 2020-06-04 ENCOUNTER — Telehealth: Payer: Self-pay

## 2020-06-04 NOTE — Telephone Encounter (Signed)
Submitted VOB, Euflexxa, right knee.

## 2020-06-08 ENCOUNTER — Ambulatory Visit: Payer: Medicare Other | Admitting: Physical Medicine and Rehabilitation

## 2020-06-08 ENCOUNTER — Other Ambulatory Visit: Payer: Self-pay

## 2020-06-08 ENCOUNTER — Encounter: Payer: Self-pay | Admitting: Physical Medicine and Rehabilitation

## 2020-06-08 VITALS — BP 154/91 | HR 58

## 2020-06-08 DIAGNOSIS — M7918 Myalgia, other site: Secondary | ICD-10-CM

## 2020-06-08 DIAGNOSIS — M47812 Spondylosis without myelopathy or radiculopathy, cervical region: Secondary | ICD-10-CM

## 2020-06-08 DIAGNOSIS — M542 Cervicalgia: Secondary | ICD-10-CM

## 2020-06-08 NOTE — Progress Notes (Signed)
[name] - 74 y.o. female MRN [id number]  Date of birth: 19-Aug-1945  Office Visit Note: Visit Date: 06/08/2020 PCP: Jilda Panda, MD Referred by: Jilda Panda, MD  Subjective: Chief Complaint  Patient presents with  . Neck - Pain   HPI: [name] is a 74 y.o. female who comes in today At the request of Dr. Anderson Malta for evaluation management of worsening severe chronic neck and occipital headache.  She has been a patient of Dr. Randel Pigg for quite some time.  I have actually seen her in the remote past for lumbar spine injections.  She does have a known allergy to contrast dye and does get angioedema which we have witnessed personally at 1 point in the office many years ago.  She reports 9 out of 10 pain which is constant and not really worse with movement or position.  She does get some relief with certain changes in positions.  Her symptoms are really behind both ears and the occiput really at the level of the insertion of the sternocleidomastoid and paraspinal upper neck muscles.  She does get some pain with rotation.  It does limit her activities of daily living to a great degree.  She has no radicular pain down the arms.  Some shoulder pain in general that Dr. Marlou Sa is looked at.  No paresthesias in the hands.  No prior history of cervical surgery.  No prior history of trigger point injections.  She does feel some knots at times in her upper back.  She denies any specific trauma.  Dr. Marlou Sa did obtain MRI of the cervical spine and this is reviewed below.  This does show multilevel facet arthropathy but without any foraminal or central canal stenosis or nerve compression.  She does have cervical arthritis around the C2-3 region.  Review of Systems  Musculoskeletal: Positive for neck pain.  Neurological: Positive for headaches.  All other systems reviewed and are negative.  Otherwise per HPI.  Assessment & Plan: Visit Diagnoses:  1. Cervicalgia   2. Cervical spondylosis  without myelopathy   3. Myofascial pain syndrome     Plan: Findings:  Chronic worsening severe axial neck pain and occiput pain and posterior radicular pain that has been going on now for close to a year with worsening over the last several months despite conservative care with medication management and home exercise and topical treatments.  MRI was obtained that does show multilevel cervical facet arthropathy particular at C2-3 which could give referral pain to the third occipital nerve to the occiput of the head.  I explained this to her at great length and we did speak for over 30 minutes on her condition.  She also has tenderness and trigger points particularly in the upper cervical spine and sternocleidomastoid.  Do to the amount of pain she is having we elected to complete trigger point injections today first to see how she does.  If she does not get much better with that then I would look at C2-3 medial branch blocks.  Patient is allergic to contrast media so we would likely look at either a small amount of Omniscan or just biplanar imaging from a safety standpoint.  If she does get relief with the trigger point injections diagnostically I would have her follow-up with a physical therapist potentially for dry needling if that was needed and manual treatment.    Meds & Orders: No orders of the defined types were placed in this encounter.  Orders Placed This Encounter  Procedures  . Trigger Point Inj    Follow-up: Return if symptoms worsen or fail to improve.   Procedures: Trigger Point Inj  Date/Time: 06/08/2020 9:42 AM Performed by: Magnus Sinning, MD Authorized by: Magnus Sinning, MD   Consent Given by:  Patient Site marked: the procedure site was marked   Timeout: prior to procedure the correct patient, procedure, and site was verified   Total # of Trigger Points:  3 or more Location: neck and back   Needle Size:  25 G Approach:  Dorsal Medications #1:  20 mg triamcinolone  acetonide 40 MG/ML Medications #2:  3 mL lidocaine 1 % Additional Injections?: No   Patient tolerance:  Patient tolerated the procedure well with no immediate complications    No notes on file   Clinical History: MRI CERVICAL SPINE WITHOUT CONTRAST  TECHNIQUE: Multiplanar, multisequence MR imaging of the cervical spine was performed. No intravenous contrast was administered.  COMPARISON:  Radiography 03/27/2007  FINDINGS: Alignment: Normal  Vertebrae: Normal  Cord: Normal  Posterior Fossa, vertebral arteries, paraspinal tissues: Normal  Disc levels:  Foramen magnum is widely patent.  No abnormality at C1-2.  C2-3: Mild bulging of the disc. Facet osteoarthritis on the right without gross edema. No canal stenosis. Foramina sufficiently patent.  C3-4: Mild bulging the disc. Bilateral facet osteoarthritis without edema. No compressive canal or foraminal narrowing.  C4-5: Bulging of the disc. Mild narrowing of the ventral subarachnoid space no compression of cord. Mild facet osteoarthritis. Mild bilateral foraminal narrowing, not grossly compressive.  C5-6: Bulging of the disc. Mild facet prominence. Mild narrowing of the subarachnoid space but compressive canal narrowing. Mild bilateral foraminal narrowing, not grossly compressive.  C6-7: Bulging of the disc. No facet degeneration. No canal or foraminal stenosis.  C7-T1: Minimal bulging of the disc. Mild facet osteoarthritis. Now no canal or foraminal stenosis.  IMPRESSION: Ordinary age related degenerative spondylosis and facet osteoarthritis. No advanced disease. No apparent compressive stenosis of the canal or foramina. Mild foraminal narrowing at C4-5 and C5-6. None of the facet arthropathy shows gross edematous change or effusions.   Electronically Signed   By: Nelson Chimes M.D.   On: 10/27/2019 07:43   She reports that she has never smoked. She has never used smokeless tobacco. No  results for input(s): HGBA1C, LABURIC in the last 8760 hours.  Objective:  VS:  HT:    WT:   BMI:     BP:(!) 154/91  HR:(!) 58bpm  TEMP: ( )  RESP:  Physical Exam Vitals and nursing note reviewed.  Constitutional:      General: She is not in acute distress.    Appearance: Normal appearance. She is not ill-appearing.  HENT:     Head: Normocephalic and atraumatic.     Right Ear: External ear normal.     Left Ear: External ear normal.  Eyes:     Extraocular Movements: Extraocular movements intact.  Cardiovascular:     Rate and Rhythm: Normal rate.     Pulses: Normal pulses.  Musculoskeletal:     Cervical back: Neck supple. Tenderness present. No rigidity.     Right lower leg: No edema.     Left lower leg: No edema.     Comments: Patient has positive trigger points in the sternocleidomastoid and splenius capitis and paraspinal musculature.  She also has trigger points noted in the levator scapula and trapezius but these do not reproduce her pain.  He does have increased pain  with rotation right and left end ranges patient has good strength in the upper extremities including 5 out of 5 strength in wrist extension long finger flexion and APB.  There is no atrophy of the hands intrinsically.  There is a negative Hoffmann's test.   Lymphadenopathy:     Cervical: No cervical adenopathy.  Skin:    Findings: No erythema, lesion or rash.  Neurological:     General: No focal deficit present.     Mental Status: She is alert and oriented to person, place, and time.     Sensory: No sensory deficit.     Motor: No weakness or abnormal muscle tone.     Coordination: Coordination normal.  Psychiatric:        Mood and Affect: Mood normal.        Behavior: Behavior normal.     Ortho Exam  Imaging: No results found.  Past Medical/Family/Surgical/Social History: Medications & Allergies reviewed per EMR, new medications updated. Patient Active Problem List   Diagnosis Date Noted  . Vaso  vagal episode 12/12/2019  . Closed posterior dislocation of right elbow 12/12/2019  . Essential hypertension 12/12/2019  . History of kidney disease 06/11/2019  . Prediabetes   . Status post foot surgery 06/27/2013  . Other hammer toe (acquired) 06/10/2013  . Pain in foot 06/10/2013  . Onychomycosis 06/10/2013   Past Medical History:  Diagnosis Date  . COPD (chronic obstructive pulmonary disease) ([facility])   . Glaucoma   . History of kidney disease   . History of kidney disease 06/11/2019  . History of recurrent UTIs   . Hyperlipidemia   . Hypertension   . Lichen sclerosus January 2017   vulva/perianal region  . Non-melanoma skin cancer 08/2015   right leg  . Osteoporosis   . Post-menopausal    . Prediabetes    Family History  Problem Relation Age of Onset  . Breast cancer Mother        deceased -65's  . Heart attack Father   . Heart attack Brother    Past Surgical History:  Procedure Laterality Date  . CLOSED REDUCTION ELBOW FRACTURE Right 12/12/2019   Procedure: CLOSED REDUCTION RIGHT ELBOW;  Surgeon: Shona Needles, MD;  Location: [facility];  Service: Orthopedics;  Laterality: Right;  . EYE SURGERY Right    Stent placement  . FOOT SURGERY Right 06/2013   right 2nd toe  . HEMATOMA EVACUATION Left 2007   left calf - excision of hemangioma with thrombus   Social History   Occupational History  . Not on file  Tobacco Use  . Smoking status: Never Smoker  . Smokeless tobacco: Never Used  Vaping Use  . Vaping Use: Never used  Substance and Sexual Activity  . Alcohol use: Not Currently  . Drug use: No  . Sexual activity: Not Currently    Birth control/protection: Post-menopausal

## 2020-06-08 NOTE — Progress Notes (Signed)
Neck pain behind both ears. Hurts up into head.  Numeric Pain Rating Scale and Functional Assessment Average Pain 9 Pain Right Now 8 My pain is constant Pain is worse with: unsure Pain improves with: rest   In the last MONTH (on 0-10 scale) has pain interfered with the following?  1. General activity like being  able to carry out your everyday physical activities such as walking, climbing stairs, carrying groceries, or moving a chair?  Rating(10)  2. Relation with others like being able to carry out your usual social activities and roles such as  activities at home, at work and in your community. Rating(10)  3. Enjoyment of life such that you have  been bothered by emotional problems such as feeling anxious, depressed or irritable?  Rating(10)

## 2020-06-09 ENCOUNTER — Telehealth: Payer: Self-pay | Admitting: Physical Medicine and Rehabilitation

## 2020-06-09 NOTE — Telephone Encounter (Signed)
Patient called advised she is having the Booster shot Saturday and wanted to know if that was ok to get. The number to contact patient is [phone]

## 2020-06-10 ENCOUNTER — Ambulatory Visit: Payer: Medicare Other

## 2020-06-10 NOTE — Telephone Encounter (Signed)
Called pt and inform her that's its okay to receive her booster shot.

## 2020-06-10 NOTE — Telephone Encounter (Signed)
Most definitely ok, booster up

## 2020-06-10 NOTE — Telephone Encounter (Signed)
Patient had trigger point injections on 10/26. Please advise.

## 2020-06-12 ENCOUNTER — Ambulatory Visit: Payer: Medicare Other | Attending: Internal Medicine

## 2020-06-12 ENCOUNTER — Ambulatory Visit: Payer: Medicare Other

## 2020-06-12 DIAGNOSIS — Z23 Encounter for immunization: Secondary | ICD-10-CM

## 2020-06-12 NOTE — Progress Notes (Signed)
   Covid-19 Vaccination Clinic  Name:  [name]    MRN: [id number] DOB: [birth year]  06/12/2020  [name] was observed post Covid-19 immunization for 15 minutes without incident. She was provided with Vaccine Information Sheet and instruction to access the V-Safe system.   [name] was instructed to call 911 with any severe reactions post vaccine: Marland Kitchen Difficulty breathing  . Swelling of face and throat  . A fast heartbeat  . A bad rash all over body  . Dizziness and weakness

## 2020-06-18 ENCOUNTER — Telehealth: Payer: Self-pay

## 2020-06-18 NOTE — Telephone Encounter (Signed)
PA required for Euflexxa, right knee.  Euflexxa is not a preferred product with patient's insurance, but may be covered after review by the plan. Uploaded patient's office notes to Euflexxa portal to initiate PA.

## 2020-06-30 ENCOUNTER — Telehealth: Payer: Self-pay

## 2020-06-30 NOTE — Telephone Encounter (Signed)
PA required for Euflexxa, right knee. Initiated PA with [employer], per Rana Snare., PA is Pending Review and case manager will call if any information is needed.  Reference# [id number] Pending PA# [id number]

## 2020-07-01 ENCOUNTER — Telehealth: Payer: Self-pay | Admitting: Orthopedic Surgery

## 2020-07-01 NOTE — Telephone Encounter (Signed)
Received call from [place] with [employer] asked for a call back needing additional information in order to process request. The number to contact [employer] is [phone]   Direct line

## 2020-07-01 NOTE — Telephone Encounter (Signed)
Noted  

## 2020-07-02 NOTE — Telephone Encounter (Signed)
Called and left a VM for [employer] to return my call concerning patient.

## 2020-07-07 ENCOUNTER — Ambulatory Visit: Payer: Medicare Other | Admitting: Orthopedic Surgery

## 2020-07-07 ENCOUNTER — Telehealth: Payer: Self-pay

## 2020-07-07 NOTE — Telephone Encounter (Signed)
Submitted for Gelsyn-3, right knee due to Euflexxa not being a preferred product through patient's insurance.

## 2020-07-12 ENCOUNTER — Telehealth: Payer: Self-pay

## 2020-07-12 ENCOUNTER — Encounter: Payer: Self-pay | Admitting: Physical Medicine and Rehabilitation

## 2020-07-12 MED ORDER — LIDOCAINE HCL 1 % IJ SOLN
3.0000 mL | INTRAMUSCULAR | Status: AC | PRN
  Administered 2020-06-08: 3 mL

## 2020-07-12 MED ORDER — TRIAMCINOLONE ACETONIDE 40 MG/ML IJ SUSP
20.0000 mg | INTRAMUSCULAR | Status: AC | PRN
  Administered 2020-06-08: 20 mg via INTRAMUSCULAR

## 2020-07-12 NOTE — Telephone Encounter (Signed)
Called and left a VM for patient to call back to schedule her other 2 appointments for gel injection.  Approved, Gelsyn-3, right knee. [place] Patient will be responsible for 20% OOP. Co-pay of $35.00 per visit No PA required

## 2020-07-19 ENCOUNTER — Ambulatory Visit: Payer: Medicare Other | Admitting: Orthopedic Surgery

## 2020-07-27 ENCOUNTER — Telehealth: Payer: Self-pay | Admitting: Orthopedic Surgery

## 2020-07-27 NOTE — Telephone Encounter (Signed)
IC s/w patient at length She will keep her appt scheduled as is for now and see if she can get someone to care for her aunt while she comes to her appointments.

## 2020-07-27 NOTE — Telephone Encounter (Signed)
Pt called stating her aunt got sick and she isn't sure she will be able to get her injection; pt would like a CB to advise her what other options she may have  [phone]

## 2020-07-28 ENCOUNTER — Ambulatory Visit: Payer: Medicare Other | Admitting: Orthopedic Surgery

## 2020-07-28 DIAGNOSIS — M1711 Unilateral primary osteoarthritis, right knee: Secondary | ICD-10-CM

## 2020-07-29 ENCOUNTER — Encounter: Payer: Self-pay | Admitting: Orthopedic Surgery

## 2020-07-31 ENCOUNTER — Encounter: Payer: Self-pay | Admitting: Orthopedic Surgery

## 2020-07-31 DIAGNOSIS — M1711 Unilateral primary osteoarthritis, right knee: Secondary | ICD-10-CM

## 2020-07-31 NOTE — Progress Notes (Signed)
   Procedure Note  Patient: [name]             Date of Birth: [birth year]           MRN: [id number]             Visit Date: 07/28/2020  Procedures: Visit Diagnoses:  1. Unilateral primary osteoarthritis, right knee     Large Joint Inj: R knee on 07/31/2020 9:45 PM Indications: diagnostic evaluation, joint swelling and pain Details: 18 G 1.5 in needle, superolateral approach  Arthrogram: No  Medications: 5 mL lidocaine 1 %; 16.8 mg Sodium Hyaluronate (Viscosup) 16.8 MG/2ML Outcome: tolerated well, no immediate complications  States that last injection lasted 3 to 4 years.  Ambulating with a cane.  Compliant with taking her baby aspirin every day.  Very mild calf tenderness today with no unilateral leg swelling.  Negative Homans' sign.  No history of DVT or use of blood thinner. Procedure, treatment alternatives, risks and benefits explained, specific risks discussed. Consent was given by the patient. Immediately prior to procedure a time out was called to verify the correct patient, procedure, equipment, support staff and site/side marked as required. Patient was prepped and draped in the usual sterile fashion.

## 2020-08-01 ENCOUNTER — Encounter: Payer: Self-pay | Admitting: Orthopedic Surgery

## 2020-08-01 MED ORDER — LIDOCAINE HCL 1 % IJ SOLN
5.0000 mL | INTRAMUSCULAR | Status: AC | PRN
  Administered 2020-07-31: 5 mL

## 2020-08-01 MED ORDER — SODIUM HYALURONATE (VISCOSUP) 16.8 MG/2ML IX SOSY
16.8000 mg | PREFILLED_SYRINGE | INTRA_ARTICULAR | Status: AC | PRN
  Administered 2020-07-31: 16.8 mg via INTRA_ARTICULAR

## 2020-08-04 ENCOUNTER — Ambulatory Visit: Payer: Medicare Other | Admitting: Orthopedic Surgery

## 2020-08-04 ENCOUNTER — Other Ambulatory Visit: Payer: Self-pay

## 2020-08-04 DIAGNOSIS — M1711 Unilateral primary osteoarthritis, right knee: Secondary | ICD-10-CM

## 2020-08-09 ENCOUNTER — Encounter: Payer: Self-pay | Admitting: Orthopedic Surgery

## 2020-08-09 DIAGNOSIS — M1711 Unilateral primary osteoarthritis, right knee: Secondary | ICD-10-CM | POA: Diagnosis not present

## 2020-08-09 NOTE — Progress Notes (Signed)
   Procedure Note  Patient: [name]             Date of Birth: 18-Aug-1945           MRN: [id number]             Visit Date: 08/04/2020  Procedures: Visit Diagnoses:  1. Unilateral primary osteoarthritis, right knee     Large Joint Inj: R knee on 08/09/2020 10:11 AM Indications: diagnostic evaluation, joint swelling and pain Details: 18 G 1.5 in needle, superolateral approach  Arthrogram: No  Medications: 5 mL lidocaine 1 %; 16.8 mg Sodium Hyaluronate (Viscosup) 16.8 MG/2ML Outcome: tolerated well, no immediate complications Procedure, treatment alternatives, risks and benefits explained, specific risks discussed. Consent was given by the patient. Immediately prior to procedure a time out was called to verify the correct patient, procedure, equipment, support staff and site/side marked as required. Patient was prepped and draped in the usual sterile fashion.

## 2020-08-11 ENCOUNTER — Encounter: Payer: Self-pay | Admitting: Surgical

## 2020-08-11 ENCOUNTER — Other Ambulatory Visit: Payer: Self-pay

## 2020-08-11 ENCOUNTER — Ambulatory Visit: Payer: Medicare Other | Admitting: Surgical

## 2020-08-11 DIAGNOSIS — M1711 Unilateral primary osteoarthritis, right knee: Secondary | ICD-10-CM | POA: Diagnosis not present

## 2020-08-11 MED ORDER — SODIUM HYALURONATE (VISCOSUP) 16.8 MG/2ML IX SOSY
16.8000 mg | PREFILLED_SYRINGE | INTRA_ARTICULAR | Status: AC | PRN
  Administered 2020-08-11: 16.8 mg via INTRA_ARTICULAR

## 2020-08-11 MED ORDER — LIDOCAINE HCL 1 % IJ SOLN
5.0000 mL | INTRAMUSCULAR | Status: AC | PRN
  Administered 2020-08-11: 5 mL

## 2020-08-11 NOTE — Progress Notes (Signed)
   Procedure Note  Patient: [name]             Date of Birth: 1946/04/13           MRN: [id number]             Visit Date: 08/11/2020  Procedures: Visit Diagnoses:  1. Unilateral primary osteoarthritis, right knee     Large Joint Inj: R knee on 08/11/2020 7:18 PM Indications: diagnostic evaluation, joint swelling and pain Details: 18 G 1.5 in needle, superolateral approach  Arthrogram: No  Medications: 5 mL lidocaine 1 %; 16.8 mg Sodium Hyaluronate (Viscosup) 16.8 MG/2ML Outcome: tolerated well, no immediate complications  3rd Gel syn injection of this series Procedure, treatment alternatives, risks and benefits explained, specific risks discussed. Consent was given by the patient. Immediately prior to procedure a time out was called to verify the correct patient, procedure, equipment, support staff and site/side marked as required. Patient was prepped and draped in the usual sterile fashion.

## 2020-08-12 ENCOUNTER — Telehealth: Payer: Self-pay

## 2020-08-12 DIAGNOSIS — R519 Headache, unspecified: Secondary | ICD-10-CM

## 2020-08-12 DIAGNOSIS — M542 Cervicalgia: Secondary | ICD-10-CM

## 2020-08-12 NOTE — Telephone Encounter (Signed)
Patient called she stated she received neck injections and her head and neckhas been hurting she is requesting a rx to help with the headaches. She stated she has been taking tylenol which isn't helping. [phone]

## 2020-08-12 NOTE — Telephone Encounter (Signed)
PT referral placed.

## 2020-08-12 NOTE — Telephone Encounter (Signed)
Patient had trigger point injections on 10/26. She reports that she did have more than 50% relief for a short time. Please advise.

## 2020-08-12 NOTE — Addendum Note (Signed)
Addended by: Cindie Crumbly B on: 08/12/2020 12:33 PM   Modules accepted: Orders

## 2020-08-12 NOTE — Telephone Encounter (Signed)
Please put in referral for physical therapy here in our office for specific dry needling and explained to the patient that that is essentially what we did the day we saw her with a trigger points and they may be able to hit different areas and hopefully make this a little bit better for her.  Tell her this is not meant to be many sessions of physical therapy and it strictly looking at mechanical treatment and dry needling.  When you put the referral in under the section where it says notes just put dry needling.  He could also point see Dr. Malta [place] notes.

## 2020-08-16 MED ORDER — SODIUM HYALURONATE (VISCOSUP) 16.8 MG/2ML IX SOSY
16.8000 mg | PREFILLED_SYRINGE | INTRA_ARTICULAR | Status: AC | PRN
  Administered 2020-08-09: 16.8 mg via INTRA_ARTICULAR

## 2020-08-16 MED ORDER — LIDOCAINE HCL 1 % IJ SOLN
5.0000 mL | INTRAMUSCULAR | Status: AC | PRN
  Administered 2020-08-09: 5 mL

## 2020-08-27 ENCOUNTER — Ambulatory Visit: Payer: Medicare Other | Admitting: Rehabilitative and Restorative Service Providers"

## 2020-08-27 ENCOUNTER — Other Ambulatory Visit: Payer: Self-pay

## 2020-08-27 ENCOUNTER — Encounter: Payer: Self-pay | Admitting: Rehabilitative and Restorative Service Providers"

## 2020-08-27 DIAGNOSIS — G4486 Cervicogenic headache: Secondary | ICD-10-CM | POA: Diagnosis not present

## 2020-08-27 DIAGNOSIS — M6281 Muscle weakness (generalized): Secondary | ICD-10-CM | POA: Diagnosis not present

## 2020-08-27 DIAGNOSIS — M542 Cervicalgia: Secondary | ICD-10-CM | POA: Diagnosis not present

## 2020-08-27 DIAGNOSIS — R293 Abnormal posture: Secondary | ICD-10-CM | POA: Diagnosis not present

## 2020-08-27 NOTE — Patient Instructions (Signed)
Access Code: [email or web address] URL: [email or web address] Date: 08/27/2020 Prepared by: Scot Jun  Exercises Seated Scapular Retraction - 2 x daily - 7 x weekly - 2 sets - 10 reps - 5 hold Supine Chin Tuck - 2 x daily - 7 x weekly - 1 sets - 10-15 reps - 5 hold Supine Shoulder Horizontal Abduction with Resistance - 2 x daily - 7 x weekly - 3 sets - 10 reps Supine Cervical Rotation AROM on Pillow - 2 x daily - 7 x weekly - 2 sets - 10 reps

## 2020-08-27 NOTE — Therapy (Addendum)
[facility] Physical Therapy [address] [place], [place], [address] Phone: [phone]   Fax:  [phone]  Physical Therapy Evaluation  Patient Details  Name: [name] MRN: [id number] Date of Birth: 24-Jan-1946 Referring Provider (PT): Dr. Ernestina Patches   Encounter Date: 08/27/2020   08/27/20 1048  PT Visits / Re-Eval  Visit Number 1  Number of Visits 12  Date for PT Re-Evaluation 10/22/20  Authorization  Progress Note Due on Visit 10  PT Time Calculation  PT Start Time 1105  PT Stop Time 1140  PT Time Calculation (min) 35 min  PT - End of Session  Activity Tolerance Patient tolerated treatment well  Behavior During Therapy [facility] for tasks assessed/performed    Past Medical History:  Diagnosis Date  . COPD (chronic obstructive pulmonary disease) ([place])   . Glaucoma   . History of kidney disease   . History of kidney disease 06/11/2019  . History of recurrent UTIs   . Hyperlipidemia   . Hypertension   . Lichen sclerosus January 2017   vulva/perianal region  . Non-melanoma skin cancer 08/2015   right leg  . Osteoporosis   . Post-menopausal    . Prediabetes     Past Surgical History:  Procedure Laterality Date  . CLOSED REDUCTION ELBOW FRACTURE Right 12/12/2019   Procedure: CLOSED REDUCTION RIGHT ELBOW;  Surgeon: Shona Needles, MD;  Location: [place];  Service: Orthopedics;  Laterality: Right;  . EYE SURGERY Right    Stent placement  . FOOT SURGERY Right 06/2013   right 2nd toe  . HEMATOMA EVACUATION Left 2007   left calf - excision of hemangioma with thrombus    There were no vitals filed for this visit.    Subjective Assessment - 08/27/20 1054    Subjective Pt. indicated having multiple things hurting but comes to clinic for complaints of neck pain for about a year or so, insidious onset.  Pt. noted posterior occipital headaches as well.  Difficulty c sleeping due to symptoms.    Pertinent History Trigger Point injections 06/08/20 c  short term relief    Limitations Sitting;House hold activities    Patient Stated Goals Reduce pain    Currently in Pain? Yes    Pain Score 7    pain at worst 10/10   Pain Location Neck    Pain Descriptors / Indicators Aching;Sharp    Aggravating Factors  bending over, head turns, looking up (general neck movement)    Pain Relieving Factors OTC advil 200 mg help a bit    Effect of Pain on Daily Activities Limited in sitting positioning, head turning c driving, housework              [facility] PT Assessment - 08/27/20 0001      Assessment   Medical Diagnosis Cervicalgia, occipital headaches    Referring Provider (PT) Dr. Ernestina Patches    Onset Date/Surgical Date 08/15/19    Hand Dominance Right      Balance Screen   Has the patient fallen in the past 6 months No   1 fall priot to 6 months ago, Rt arm pain     [place] residence    Additional Comments Lives alone c cat      Prior Function   Level of Independence Independent    Leisure reading , putting puzzles together      Cognition   Overall Cognitive Status Within Functional Limits for tasks assessed  Observation/Other Assessments   Focus on Therapeutic Outcomes (FOTO)  intake 45%      Sensation   Light Touch Appears Intact      Posture/Postural Control   Posture/Postural Control Postural limitations    Postural Limitations Rounded Shoulders;Forward head;Increased thoracic kyphosis      ROM / Strength   AROM / PROM / Strength Strength;PROM;AROM      AROM   Overall AROM Comments Pain mild at end range all directions    AROM Assessment Site Cervical    Cervical Flexion 38    Cervical Extension 30    Cervical - Right Rotation 52   pain bilateral cervical   Cervical - Left Rotation 60   pain bilateral cervical     PROM   Overall PROM Comments Overpressure pain noted all directions, movement equal to AROM      Strength   Overall Strength Comments Myotomal assessment UE WFL at  this time    Strength Assessment Site Shoulder;Elbow    Right/Left Shoulder Left;Right      Palpation   Spinal mobility Limitation and concordant pain in translatory glides bilateral throughout cervical region.  hypomobility in upper and mid thoracic                      Objective measurements completed on examination: See above findings.       [facility] Adult PT Treatment/Exercise - 08/27/20 0001      Ambulation/Gait   Gait Comments SPC use in Rt UE   Corrected for use in Lt UE based off Rt knee symptoms     Exercises   Exercises Other Exercises;Neck    Other Exercises  HEP instruction/performance      Neck Exercises: Seated   Other Seated Exercise scap retraction x 10      Neck Exercises: Supine   Other Supine Exercise UC flexion hold 5 sec x 10    Other Supine Exercise supine cervical rotation x 10, horizontal abduction AROM x 10      Manual Therapy   Manual therapy comments g2 cervical downslope mobs bilateral mid and lower, supine occipital release c trigger point pressure                  PT Education - 08/27/20 1047    Education Details HEP, POC, DN    Person(s) Educated Patient    Methods Explanation;Demonstration;Handout;Verbal cues    Comprehension Returned demonstration;Verbalized understanding            PT Short Term Goals - 08/27/20 1049      PT SHORT TERM GOAL #1   Title Patient will demonstrate independent use of home exercise program to maintain progress from in clinic treatments.    Time 3    Period Weeks    Status New    Target Date 09/17/20             PT Long Term Goals - 08/27/20 1049      PT LONG TERM GOAL #1   Title Patient will demonstrate/report pain at worst less than or equal to 2/10 to facilitate minimal limitation in daily activity secondary to pain symptoms.    Time 10    Period Weeks    Status New    Target Date 11/05/20      PT LONG TERM GOAL #2   Title Patient will demonstrate independent use of  home exercise program to facilitate ability to maintain/progress functional gains from skilled physical therapy services.  Time 8    Period Weeks    Status New    Target Date 11/05/20      PT LONG TERM GOAL #3   Title Patient will demonstrate cervical AROM WFL s symptoms to facilitate daily activity including driving, self care at PLOF s limitation due to symptoms.    Time 8    Period Weeks    Status New    Target Date 11/05/20      PT LONG TERM GOAL #4   Title FOTO outcome > = 58% to indicated reduced disability.    Time 8    Period Weeks    Status New    Target Date 10/22/20                   08/27/20 1051  Plan  Clinical Impression Statement Patient is a 75 y.o. female who comes to clinic with complaints of cervical pain c headaches with mobility, strength and movement coordination deficits that impair their ability to perform usual daily and recreational functional activities without increase difficulty/symptoms at this time.  Patient to benefit from skilled PT services to address impairments and limitations to improve to previous level of function without restriction secondary to condition.  Personal Factors and Comorbidities Comorbidity 3+  Comorbidities Osteoporosis, COPD, HTN, hyperlipidemia  Examination-Activity Limitations Sit;Sleep  Examination-Participation Restrictions Other (home activity)  Pt will benefit from skilled therapeutic intervention in order to improve on the following deficits Hypomobility;Pain;Increased fascial restricitons;Decreased activity tolerance;Decreased mobility;Increased muscle spasms;Improper body mechanics;Impaired perceived functional ability;Postural dysfunction;Impaired flexibility;Decreased range of motion  Stability/Clinical Decision Making Stable/Uncomplicated  Clinical Decision Making Low  Rehab Potential Good  PT Frequency  (1-2x/week)  PT Duration 8 weeks  PT Treatment/Interventions ADLs/Self Care Home  Management;Cryotherapy;Electrical Stimulation;Iontophoresis 4mg /ml Dexamethasone;Moist Heat;Traction;Balance training;Therapeutic exercise;Therapeutic activities;Functional mobility training;Stair training;Gait training;DME Instruction;Ultrasound;Neuromuscular re-education;Patient/family education;Manual techniques;Taping;Passive range of motion;Dry needling;Spinal Manipulations;Joint Manipulations  PT Next Visit Plan Reassess HEP, occipital release, cervical manual mobility gains, DN?  PT Home Exercise Plan [email or web address]  Consulted and Agree with Plan of Care Patient    Patient will benefit from skilled therapeutic intervention in order to improve the following deficits and impairments:     Visit Diagnosis: Cervicalgia  Cervicogenic headache  Muscle weakness (generalized)  Abnormal posture     Problem List Patient Active Problem List   Diagnosis Date Noted  . Vaso vagal episode 12/12/2019  . Closed posterior dislocation of right elbow 12/12/2019  . Essential hypertension 12/12/2019  . History of kidney disease 06/11/2019  . Prediabetes   . Status post foot surgery 06/27/2013  . Other hammer toe (acquired) 06/10/2013  . Pain in foot 06/10/2013  . Onychomycosis 06/10/2013    Scot Jun, PT, DPT, OCS, ATC 08/27/20  11:52 AM    Scot Jun, PT, DPT, OCS, ATC 09/08/20  11:51 AM   Provided treatment time correction Scot Jun, PT, DPT, OCS, ATC 09/14/20  9:39 AM          [facility] Physical Therapy [address] [place], [place], [address] Phone: [phone]   Fax:  [phone]  Name: LARIA GRIMMETT MRN: [id number] Date of Birth: 1946-05-05

## 2020-09-01 ENCOUNTER — Encounter: Payer: Self-pay | Admitting: Physical Therapy

## 2020-09-08 ENCOUNTER — Encounter: Payer: Self-pay | Admitting: Rehabilitative and Restorative Service Providers"

## 2020-09-08 ENCOUNTER — Other Ambulatory Visit: Payer: Self-pay

## 2020-09-08 ENCOUNTER — Ambulatory Visit: Payer: Medicare Other | Admitting: Rehabilitative and Restorative Service Providers"

## 2020-09-08 DIAGNOSIS — G4486 Cervicogenic headache: Secondary | ICD-10-CM

## 2020-09-08 DIAGNOSIS — R293 Abnormal posture: Secondary | ICD-10-CM

## 2020-09-08 DIAGNOSIS — M6281 Muscle weakness (generalized): Secondary | ICD-10-CM

## 2020-09-08 DIAGNOSIS — M542 Cervicalgia: Secondary | ICD-10-CM | POA: Diagnosis not present

## 2020-09-08 NOTE — Therapy (Signed)
[facility] Physical Therapy [address] [place], [place], [address] Phone: [phone]   Fax:  [phone]  Physical Therapy Treatment  Patient Details  Name: [name] MRN: [id number] Date of Birth: 07/09/1946 Referring Provider (PT): Dr. Ernestina Patches   Encounter Date: 09/08/2020   PT End of Session - 09/08/20 1119    Visit Number 2    Number of Visits 12    Date for PT Re-Evaluation 10/22/20    Progress Note Due on Visit 10    PT Start Time 1104    PT Stop Time 1147    PT Time Calculation (min) 43 min    Activity Tolerance Patient tolerated treatment well    Behavior During Therapy [facility] for tasks assessed/performed           Past Medical History:  Diagnosis Date  . COPD (chronic obstructive pulmonary disease) ([place])   . Glaucoma   . History of kidney disease   . History of kidney disease 06/11/2019  . History of recurrent UTIs   . Hyperlipidemia   . Hypertension   . Lichen sclerosus January 2017   vulva/perianal region  . Non-melanoma skin cancer 08/2015   right leg  . Osteoporosis   . Post-menopausal    . Prediabetes     Past Surgical History:  Procedure Laterality Date  . CLOSED REDUCTION ELBOW FRACTURE Right 12/12/2019   Procedure: CLOSED REDUCTION RIGHT ELBOW;  Surgeon: Shona Needles, MD;  Location: [place];  Service: Orthopedics;  Laterality: Right;  . EYE SURGERY Right    Stent placement  . FOOT SURGERY Right 06/2013   right 2nd toe  . HEMATOMA EVACUATION Left 2007   left calf - excision of hemangioma with thrombus    There were no vitals filed for this visit.   Subjective Assessment - 09/08/20 1108    Subjective Pt. indicated complaints at 5/10 today, throbbing in base of head/neck on both sides.    Pertinent History Trigger Point injections 06/08/20 c short term relief    Limitations Sitting;House hold activities    Patient Stated Goals Reduce pain    Currently in Pain? Yes    Pain Location Neck    Pain Orientation  Upper;Posterior    Pain Descriptors / Indicators Aching;Sharp    Pain Frequency Constant    Aggravating Factors  morning are worse, constant in last few days                             [facility] Adult PT Treatment/Exercise - 09/08/20 0001      Ambulation/Gait   Gait Comments Independent ambulation today      Neck Exercises: Supine   Other Supine Exercise UC flexion hold 5 sec x 15    Other Supine Exercise supine cervical rotation x 15 each way      Modalities   Modalities Moist Heat      Moist Heat Therapy   Number Minutes Moist Heat 5 Minutes    Moist Heat Location Cervical   prone     Traction   Type of Traction --    Min (lbs) --    Max (lbs) --    Hold Time --    Rest Time --    Time --      Manual Therapy   Manual therapy comments occipital release direct pressure, cervical distraction manual light            Trigger Point Dry  Needling - 09/08/20 0001    Consent Given? Yes    Education Handout Provided Previously provided    Muscles Treated Head and Neck Suboccipitals   bilateral   Suboccipitals Response Twitch response elicited                PT Education - 09/08/20 1119    Education Details DN review    Person(s) Educated Patient    Methods Explanation    Comprehension Verbalized understanding            PT Short Term Goals - 09/08/20 1119      PT SHORT TERM GOAL #1   Title Patient will demonstrate independent use of home exercise program to maintain progress from in clinic treatments.    Time 3    Period Weeks    Status On-going    Target Date 09/17/20             PT Long Term Goals - 08/27/20 1049      PT LONG TERM GOAL #1   Title Patient will demonstrate/report pain at worst less than or equal to 2/10 to facilitate minimal limitation in daily activity secondary to pain symptoms.    Time 10    Period Weeks    Status New    Target Date 11/05/20      PT LONG TERM GOAL #2   Title Patient will demonstrate  independent use of home exercise program to facilitate ability to maintain/progress functional gains from skilled physical therapy services.    Time 8    Period Weeks    Status New    Target Date 11/05/20      PT LONG TERM GOAL #3   Title Patient will demonstrate cervical AROM WFL s symptoms to facilitate daily activity including driving, self care at PLOF s limitation due to symptoms.    Time 8    Period Weeks    Status New    Target Date 11/05/20      PT LONG TERM GOAL #4   Title FOTO outcome > = 58% to indicated reduced disability.    Time 8    Period Weeks    Status New    Target Date 10/22/20                 Plan - 09/08/20 1121    Clinical Impression Statement Presentation of noted concordant symptoms in suboccipital musculature today upon palpation and also with intermuscular dry needling twitch.  Performance of cervical manual traction c no adverse reactions reported during visit.  Reviewed HEP for home use between visits.    Personal Factors and Comorbidities Comorbidity 3+    Comorbidities Osteoporosis, COPD, HTN, hyperlipidemia    Examination-Activity Limitations Sit;Sleep    Examination-Participation Restrictions Other   home activity   Stability/Clinical Decision Making Stable/Uncomplicated    Rehab Potential Good    PT Frequency --   1-2x/week   PT Duration 8 weeks    PT Treatment/Interventions ADLs/Self Care Home Management;Cryotherapy;Electrical Stimulation;Iontophoresis 4mg /ml Dexamethasone;Moist Heat;Balance training;Therapeutic exercise;Therapeutic activities;Functional mobility training;Stair training;Gait training;DME Instruction;Ultrasound;Neuromuscular re-education;Patient/family education;Manual techniques;Taping;Passive range of motion;Dry needling;Spinal Manipulations;Joint Manipulations    PT Next Visit Plan occipital release, cervical manual mobility gains, DN, posterior upper thoracic/shoulder/cervical muscle activation.    PT Home Exercise Plan  [email or web address]    Consulted and Agree with Plan of Care Patient           Patient will benefit from skilled therapeutic intervention in order to improve the following deficits and  impairments:  Hypomobility,Pain,Increased fascial restricitons,Decreased activity tolerance,Decreased mobility,Increased muscle spasms,Improper body mechanics,Impaired perceived functional ability,Postural dysfunction,Impaired flexibility,Decreased range of motion  Visit Diagnosis: Cervicalgia  Cervicogenic headache  Muscle weakness (generalized)  Abnormal posture     Problem List Patient Active Problem List   Diagnosis Date Noted  . Vaso vagal episode 12/12/2019  . Closed posterior dislocation of right elbow 12/12/2019  . Essential hypertension 12/12/2019  . History of kidney disease 06/11/2019  . Prediabetes   . Status post foot surgery 06/27/2013  . Other hammer toe (acquired) 06/10/2013  . Pain in foot 06/10/2013  . Onychomycosis 06/10/2013    Scot Jun, PT, DPT, OCS, ATC 09/08/20  11:49 AM    [facility] Physical Therapy [address] [place], [place], [address] Phone: [phone]   Fax:  [phone]  Name: [name] MRN: [id number] Date of Birth: 11-20-1945

## 2020-09-14 ENCOUNTER — Ambulatory Visit: Payer: Medicare Other | Admitting: Rehabilitative and Restorative Service Providers"

## 2020-09-14 ENCOUNTER — Other Ambulatory Visit: Payer: Self-pay

## 2020-09-14 ENCOUNTER — Encounter: Payer: Self-pay | Admitting: Rehabilitative and Restorative Service Providers"

## 2020-09-14 DIAGNOSIS — M6281 Muscle weakness (generalized): Secondary | ICD-10-CM

## 2020-09-14 DIAGNOSIS — G4486 Cervicogenic headache: Secondary | ICD-10-CM | POA: Diagnosis not present

## 2020-09-14 DIAGNOSIS — R293 Abnormal posture: Secondary | ICD-10-CM | POA: Diagnosis not present

## 2020-09-14 DIAGNOSIS — M542 Cervicalgia: Secondary | ICD-10-CM

## 2020-09-14 NOTE — Therapy (Signed)
[facility] Physical Therapy [address] [place], [place], [address] Phone: [phone]   Fax:  [phone]  Physical Therapy Treatment  Patient Details  Name: [name] MRN: [id number] Date of Birth: January 21, 1946 Referring Provider (PT): Dr. Ernestina Patches   Encounter Date: 09/14/2020   PT End of Session - 09/14/20 0936    Visit Number 3    Number of Visits 12    Date for PT Re-Evaluation 10/22/20    Progress Note Due on Visit 10    PT Start Time 0930    PT Stop Time 1010    PT Time Calculation (min) 40 min    Activity Tolerance Patient tolerated treatment well    Behavior During Therapy [facility] for tasks assessed/performed           Past Medical History:  Diagnosis Date  . COPD (chronic obstructive pulmonary disease) ([place])   . Glaucoma   . History of kidney disease   . History of kidney disease 06/11/2019  . History of recurrent UTIs   . Hyperlipidemia   . Hypertension   . Lichen sclerosus January 2017   vulva/perianal region  . Non-melanoma skin cancer 08/2015   right leg  . Osteoporosis   . Post-menopausal    . Prediabetes     Past Surgical History:  Procedure Laterality Date  . CLOSED REDUCTION ELBOW FRACTURE Right 12/12/2019   Procedure: CLOSED REDUCTION RIGHT ELBOW;  Surgeon: Shona Needles, MD;  Location: [place];  Service: Orthopedics;  Laterality: Right;  . EYE SURGERY Right    Stent placement  . FOOT SURGERY Right 06/2013   right 2nd toe  . HEMATOMA EVACUATION Left 2007   left calf - excision of hemangioma with thrombus    There were no vitals filed for this visit.   Subjective Assessment - 09/14/20 0934    Subjective Pt. stated having times feeling better but does feel pain in morning.  Today at 7-8/10 upon waking up.    Pertinent History Trigger Point injections 06/08/20 c short term relief    Limitations Sitting;House hold activities    Patient Stated Goals Reduce pain    Pain Score 7     Pain Location Neck    Pain  Orientation Upper;Posterior    Pain Descriptors / Indicators Aching    Pain Frequency Intermittent    Aggravating Factors  morning worse                             [facility] Adult PT Treatment/Exercise - 09/14/20 0001      Neck Exercises: Machines for Strengthening   UBE (Upper Arm Bike) Lvl 2.5 4.5 mins fwd, 4 mins backward      Neck Exercises: Standing   Other Standing Exercises green band rows, gh ext 20x each way      Neck Exercises: Supine   Shoulder ABduction Both;20 reps   green band   Other Supine Exercise UC flexion hold 5 sec x 15      Manual Therapy   Manual therapy comments occipital release direct pressure, cervical distraction manual light                    PT Short Term Goals - 09/08/20 1119      PT SHORT TERM GOAL #1   Title Patient will demonstrate independent use of home exercise program to maintain progress from in clinic treatments.    Time 3  Period Weeks    Status On-going    Target Date 09/17/20             PT Long Term Goals - 08/27/20 1049      PT LONG TERM GOAL #1   Title Patient will demonstrate/report pain at worst less than or equal to 2/10 to facilitate minimal limitation in daily activity secondary to pain symptoms.    Time 10    Period Weeks    Status New    Target Date 11/05/20      PT LONG TERM GOAL #2   Title Patient will demonstrate independent use of home exercise program to facilitate ability to maintain/progress functional gains from skilled physical therapy services.    Time 8    Period Weeks    Status New    Target Date 11/05/20      PT LONG TERM GOAL #3   Title Patient will demonstrate cervical AROM WFL s symptoms to facilitate daily activity including driving, self care at PLOF s limitation due to symptoms.    Time 8    Period Weeks    Status New    Target Date 11/05/20      PT LONG TERM GOAL #4   Title FOTO outcome > = 58% to indicated reduced disability.    Time 8    Period Weeks     Status New    Target Date 10/22/20                 Plan - 09/14/20 1005    Clinical Impression Statement Benefits noted from manual intervention .  Continued emphasis on use of HEP for mobility and stretching for self management at home between visits.    Personal Factors and Comorbidities Comorbidity 3+    Comorbidities Osteoporosis, COPD, HTN, hyperlipidemia    Examination-Activity Limitations Sit;Sleep    Examination-Participation Restrictions Other   home activity   Stability/Clinical Decision Making Stable/Uncomplicated    Rehab Potential Good    PT Frequency --   1-2x/week   PT Duration 8 weeks    PT Treatment/Interventions ADLs/Self Care Home Management;Cryotherapy;Electrical Stimulation;Iontophoresis 4mg /ml Dexamethasone;Moist Heat;Balance training;Therapeutic exercise;Therapeutic activities;Functional mobility training;Stair training;Gait training;DME Instruction;Ultrasound;Neuromuscular re-education;Patient/family education;Manual techniques;Taping;Passive range of motion;Dry needling;Spinal Manipulations;Joint Manipulations    PT Next Visit Plan occipital release, cervical manual mobility gains, DN, posterior upper thoracic/shoulder/cervical muscle activation.    PT Home Exercise Plan [email or web address]    Consulted and Agree with Plan of Care Patient           Patient will benefit from skilled therapeutic intervention in order to improve the following deficits and impairments:  Hypomobility,Pain,Increased fascial restricitons,Decreased activity tolerance,Decreased mobility,Increased muscle spasms,Improper body mechanics,Impaired perceived functional ability,Postural dysfunction,Impaired flexibility,Decreased range of motion  Visit Diagnosis: Cervicalgia  Muscle weakness (generalized)  Abnormal posture  Cervicogenic headache     Problem List Patient Active Problem List   Diagnosis Date Noted  . Vaso vagal episode 12/12/2019  . Closed posterior dislocation of right  elbow 12/12/2019  . Essential hypertension 12/12/2019  . History of kidney disease 06/11/2019  . Prediabetes   . Status post foot surgery 06/27/2013  . Other hammer toe (acquired) 06/10/2013  . Pain in foot 06/10/2013  . Onychomycosis 06/10/2013    Scot Jun, PT, DPT, OCS, ATC 09/14/20  10:07 AM    [facility] Physical Therapy [address] [place], [place], [address] Phone: [phone]   Fax:  [phone]  Name: [name] MRN: [id number] Date of Birth: Jan 02, 1946

## 2020-09-14 NOTE — Patient Instructions (Signed)
Access Code: [email or web address] URL: [email or web address] Date: 09/14/2020 Prepared by: Scot Jun  Exercises Seated Scapular Retraction - 2 x daily - 7 x weekly - 2 sets - 10 reps - 5 hold Supine Chin Tuck - 2 x daily - 7 x weekly - 1 sets - 10-15 reps - 5 hold Supine Shoulder Horizontal Abduction with Resistance - 1 x daily - 7 x weekly - 3 sets - 10 reps Supine Cervical Rotation AROM on Pillow - 2 x daily - 7 x weekly - 2 sets - 10 reps Standing Shoulder Row with Anchored Resistance - 2 x daily - 7 x weekly - 2 sets - 10 reps Shoulder Extension with Resistance - 2 x daily - 7 x weekly - 2 sets - 10 reps

## 2020-09-16 ENCOUNTER — Encounter: Payer: Self-pay | Admitting: Rehabilitative and Restorative Service Providers"

## 2020-09-21 ENCOUNTER — Ambulatory Visit: Payer: Medicare Other | Admitting: Rehabilitative and Restorative Service Providers"

## 2020-09-21 ENCOUNTER — Other Ambulatory Visit: Payer: Self-pay

## 2020-09-21 ENCOUNTER — Encounter: Payer: Self-pay | Admitting: Rehabilitative and Restorative Service Providers"

## 2020-09-21 DIAGNOSIS — M6281 Muscle weakness (generalized): Secondary | ICD-10-CM

## 2020-09-21 DIAGNOSIS — R293 Abnormal posture: Secondary | ICD-10-CM

## 2020-09-21 DIAGNOSIS — M542 Cervicalgia: Secondary | ICD-10-CM

## 2020-09-21 DIAGNOSIS — G4486 Cervicogenic headache: Secondary | ICD-10-CM | POA: Diagnosis not present

## 2020-09-21 NOTE — Therapy (Signed)
[facility] Physical Therapy [address] [place], [place], [address] Phone: [phone]   Fax:  [phone]  Physical Therapy Treatment/Progress Note  Patient Details  Name: [name] MRN: [id number] Date of Birth: 03/10/1946 Referring Provider (PT): Dr. [place]ina Patches   Encounter Date: 09/21/2020   Progress Note Reporting Period 08/27/2020 to 09/21/2020  See note below for Objective Data and Assessment of Progress/Goals.        PT End of Session - 09/21/20 0928    Visit Number 4    Number of Visits 12    Date for PT Re-Evaluation 10/22/20    Progress Note Due on Visit 10    PT Start Time 0929    PT Stop Time 1008    PT Time Calculation (min) 39 min    Activity Tolerance Patient tolerated treatment well    Behavior During Therapy [facility] for tasks assessed/performed           Past Medical History:  Diagnosis Date  . COPD (chronic obstructive pulmonary disease) ([place])   . Glaucoma   . History of kidney disease   . History of kidney disease 06/11/2019  . History of recurrent UTIs   . Hyperlipidemia   . Hypertension   . Lichen sclerosus January 2017   vulva/perianal region  . Non-melanoma skin cancer 08/2015   right leg  . Osteoporosis   . Post-menopausal    . Prediabetes     Past Surgical History:  Procedure Laterality Date  . CLOSED REDUCTION ELBOW FRACTURE Right 12/12/2019   Procedure: CLOSED REDUCTION RIGHT ELBOW;  Surgeon: Shona Needles, MD;  Location: [place];  Service: Orthopedics;  Laterality: Right;  . EYE SURGERY Right    Stent placement  . FOOT SURGERY Right 06/2013   right 2nd toe  . HEMATOMA EVACUATION Left 2007   left calf - excision of hemangioma with thrombus    There were no vitals filed for this visit.   Subjective Assessment - 09/21/20 0933    Subjective Pt. stated feeling like she is doing better. Pt. stated about 50% overall improvement at this time.    Pertinent History Trigger Point injections 06/08/20 c short term  relief    Limitations Sitting;House hold activities    Patient Stated Goals Reduce pain    Currently in Pain? No/denies    Pain Score 0-No pain   pain at worst 3/10   Pain Location Neck    Pain Orientation Upper;Posterior    Pain Frequency Occasional    Aggravating Factors  mornings still noted              [facility] PT Assessment - 09/21/20 0001      Assessment   Medical Diagnosis Cervicalgia, occipital headaches    Referring Provider (PT) Dr. [place]ina Patches    Onset Date/Surgical Date 08/15/19    Hand Dominance Right      Observation/Other Assessments   Focus on Therapeutic Outcomes (FOTO)  update: 60%      AROM   Cervical Flexion 46    Cervical Extension 48    Cervical - Right Rotation 62    Cervical - Left Rotation 62      Palpation   Spinal mobility Limitation in translatory glides C2-C4 bilateral                         [facility] Adult PT Treatment/Exercise - 09/21/20 0001      Neck Exercises: Machines for Strengthening   UBE (Upper  Arm Bike) Lvl 2.5 5 mins fwd/back each way      Neck Exercises: Supine   Shoulder ABduction Both   green 3 x 10   Other Supine Exercise UC flexion hold 5 sec x 15    Other Supine Exercise scap retraction 5 sec hold x 15      Manual Therapy   Manual therapy comments occipital release direct pressure, cervical distraction manual light                    PT Short Term Goals - 09/21/20 0936      PT SHORT TERM GOAL #1   Title Patient will demonstrate independent use of home exercise program to maintain progress from in clinic treatments.    Time 3    Period Weeks    Status Achieved    Target Date 09/17/20             PT Long Term Goals - 09/21/20 1004      PT LONG TERM GOAL #1   Title Patient will demonstrate/report pain at worst less than or equal to 2/10 to facilitate minimal limitation in daily activity secondary to pain symptoms.    Time 10    Period Weeks    Status On-going      PT LONG TERM GOAL #2    Title Patient will demonstrate independent use of home exercise program to facilitate ability to maintain/progress functional gains from skilled physical therapy services.    Time 8    Period Weeks    Status On-going      PT LONG TERM GOAL #3   Title Patient will demonstrate cervical AROM WFL s symptoms to facilitate daily activity including driving, self care at PLOF s limitation due to symptoms.    Time 8    Period Weeks    Status On-going      PT LONG TERM GOAL #4   Title FOTO outcome > = 58% to indicated reduced disability.    Time 8    Period Weeks    Status Achieved                 Plan - 09/21/20 4782    Clinical Impression Statement Pt. stated feeling about 50% overall improvement, symptoms at worst 3/10 (reduced compared to evaluation level).  See objective data for updated information showing improvement gains. Pt. has attended 4 visits overall during course of treatment.  Pt. has made improvements towards goals and may still benefit from skilled PT services to continue gains and improve function.    Personal Factors and Comorbidities Comorbidity 3+    Comorbidities Osteoporosis, COPD, HTN, hyperlipidemia    Examination-Activity Limitations Sit;Sleep    Examination-Participation Restrictions Other   home activity   Stability/Clinical Decision Making Stable/Uncomplicated    Rehab Potential Good    PT Frequency --   1-2x/week   PT Duration 8 weeks    PT Treatment/Interventions ADLs/Self Care Home Management;Cryotherapy;Electrical Stimulation;Iontophoresis 4mg /ml Dexamethasone;Moist Heat;Balance training;Therapeutic exercise;Therapeutic activities;Functional mobility training;Stair training;Gait training;DME Instruction;Ultrasound;Neuromuscular re-education;Patient/family education;Manual techniques;Taping;Passive range of motion;Dry needling;Spinal Manipulations;Joint Manipulations    PT Next Visit Plan occipital release, cervical manual mobility gains, DN, posterior  upper thoracic/shoulder/cervical muscle activation.    PT Home Exercise Plan [id number]    Consulted and Agree with Plan of Care Patient           Patient will benefit from skilled therapeutic intervention in order to improve the following deficits and impairments:  Hypomobility,Pain,Increased fascial restricitons,Decreased activity  tolerance,Decreased mobility,Increased muscle spasms,Improper body mechanics,Impaired perceived functional ability,Postural dysfunction,Impaired flexibility,Decreased range of motion  Visit Diagnosis: Cervicalgia  Muscle weakness (generalized)  Abnormal posture  Cervicogenic headache     Problem List Patient Active Problem List   Diagnosis Date Noted  . Vaso vagal episode 12/12/2019  . Closed posterior dislocation of right elbow 12/12/2019  . Essential hypertension 12/12/2019  . History of kidney disease 06/11/2019  . Prediabetes   . Status post foot surgery 06/27/2013  . Other hammer toe (acquired) 06/10/2013  . Pain in foot 06/10/2013  . Onychomycosis 06/10/2013    Scot Jun, PT, DPT, OCS, ATC 09/21/20  10:07 AM    [facility] Physical Therapy [address] [place], [place], [address] Phone: [phone]   Fax:  [phone]  Name: RYEN HEITMEYER MRN: [id number] Date of Birth: 1945/12/12

## 2020-09-23 ENCOUNTER — Encounter: Payer: Self-pay | Admitting: Rehabilitative and Restorative Service Providers"

## 2020-09-23 ENCOUNTER — Other Ambulatory Visit: Payer: Self-pay

## 2020-09-23 ENCOUNTER — Ambulatory Visit: Payer: Medicare Other | Admitting: Rehabilitative and Restorative Service Providers"

## 2020-09-23 DIAGNOSIS — M542 Cervicalgia: Secondary | ICD-10-CM | POA: Diagnosis not present

## 2020-09-23 DIAGNOSIS — M6281 Muscle weakness (generalized): Secondary | ICD-10-CM

## 2020-09-23 DIAGNOSIS — R293 Abnormal posture: Secondary | ICD-10-CM | POA: Diagnosis not present

## 2020-09-23 DIAGNOSIS — G4486 Cervicogenic headache: Secondary | ICD-10-CM | POA: Diagnosis not present

## 2020-09-23 NOTE — Therapy (Signed)
[facility] Physical Therapy [address] [place], [place], [address] Phone: [phone]   Fax:  [phone]  Physical Therapy Treatment  Patient Details  Name: [name] MRN: [id number] Date of Birth: 05-15-1946 Referring Provider (PT): Dr. [place]ina Patches   Encounter Date: 09/23/2020   PT End of Session - 09/23/20 1023    Visit Number 5    Number of Visits 12    Date for PT Re-Evaluation 10/22/20    Progress Note Due on Visit 10    PT Start Time 1014    PT Stop Time 1053    PT Time Calculation (min) 39 min    Activity Tolerance Patient tolerated treatment well    Behavior During Therapy [facility] for tasks assessed/performed           Past Medical History:  Diagnosis Date  . COPD (chronic obstructive pulmonary disease) ([place])   . Glaucoma   . History of kidney disease   . History of kidney disease 06/11/2019  . History of recurrent UTIs   . Hyperlipidemia   . Hypertension   . Lichen sclerosus January 2017   vulva/perianal region  . Non-melanoma skin cancer 08/2015   right leg  . Osteoporosis   . Post-menopausal    . Prediabetes     Past Surgical History:  Procedure Laterality Date  . CLOSED REDUCTION ELBOW FRACTURE Right 12/12/2019   Procedure: CLOSED REDUCTION RIGHT ELBOW;  Surgeon: Shona Needles, MD;  Location: [place];  Service: Orthopedics;  Laterality: Right;  . EYE SURGERY Right    Stent placement  . FOOT SURGERY Right 06/2013   right 2nd toe  . HEMATOMA EVACUATION Left 2007   left calf - excision of hemangioma with thrombus    There were no vitals filed for this visit.   Subjective Assessment - 09/23/20 1021    Subjective Pt. stated last night felt some Lt shoulder pain mild to moderate while watching TV, HEP improved it some.  Pt. stated no real pain today in neck.  Reported Rt knee complaints today.    Pertinent History Trigger Point injections 06/08/20 c short term relief    Limitations Sitting;House hold activities    Patient  Stated Goals Reduce pain    Currently in Pain? No/denies    Pain Location Neck    Pain Orientation Upper;Posterior    Pain Descriptors / Indicators Aching    Pain Frequency Occasional    Aggravating Factors  sitting watching TV last night                             [facility] Adult PT Treatment/Exercise - 09/23/20 0001      Neck Exercises: Machines for Strengthening   UBE (Upper Arm Bike) lvl 3 4 mins fwd/back each way      Neck Exercises: Theraband   Other Theraband Exercises rows gh ext, rows blue 2x15 each      Neck Exercises: Supine   Other Supine Exercise UC flexion hold 5 sec x 15    Other Supine Exercise scap retraction 5 sec hold x 15      Manual Therapy   Manual therapy comments occipital release direct pressure, cervical distraction manual light                    PT Short Term Goals - 09/21/20 0936      PT SHORT TERM GOAL #1   Title Patient will demonstrate independent use  of home exercise program to maintain progress from in clinic treatments.    Time 3    Period Weeks    Status Achieved    Target Date 09/17/20             PT Long Term Goals - 09/21/20 1004      PT LONG TERM GOAL #1   Title Patient will demonstrate/report pain at worst less than or equal to 2/10 to facilitate minimal limitation in daily activity secondary to pain symptoms.    Time 10    Period Weeks    Status On-going      PT LONG TERM GOAL #2   Title Patient will demonstrate independent use of home exercise program to facilitate ability to maintain/progress functional gains from skilled physical therapy services.    Time 8    Period Weeks    Status On-going      PT LONG TERM GOAL #3   Title Patient will demonstrate cervical AROM WFL s symptoms to facilitate daily activity including driving, self care at PLOF s limitation due to symptoms.    Time 8    Period Weeks    Status On-going      PT LONG TERM GOAL #4   Title FOTO outcome > = 58% to indicated  reduced disability.    Time 8    Period Weeks    Status Achieved                 Plan - 09/23/20 1049    Clinical Impression Statement Progressed today to include increased resistance and endurance training for postural and scapular activation for postural support in daily activity.  Reduced severity of tenderness in suboccipital musculature noted at this time.    Personal Factors and Comorbidities Comorbidity 3+    Comorbidities Osteoporosis, COPD, HTN, hyperlipidemia    Examination-Activity Limitations Sit;Sleep    Examination-Participation Restrictions Other   home activity   Stability/Clinical Decision Making Stable/Uncomplicated    Rehab Potential Good    PT Frequency --   1-2x/week   PT Duration 8 weeks    PT Treatment/Interventions ADLs/Self Care Home Management;Cryotherapy;Electrical Stimulation;Iontophoresis 4mg /ml Dexamethasone;Moist Heat;Balance training;Therapeutic exercise;Therapeutic activities;Functional mobility training;Stair training;Gait training;DME Instruction;Ultrasound;Neuromuscular re-education;Patient/family education;Manual techniques;Taping;Passive range of motion;Dry needling;Spinal Manipulations;Joint Manipulations    PT Next Visit Plan occipital release, cervical manual mobility gains, posterior upper thoracic/shoulder/cervical muscle activation.    PT Home Exercise Plan [email or web address]    Consulted and Agree with Plan of Care Patient           Patient will benefit from skilled therapeutic intervention in order to improve the following deficits and impairments:  Hypomobility,Pain,Increased fascial restricitons,Decreased activity tolerance,Decreased mobility,Increased muscle spasms,Improper body mechanics,Impaired perceived functional ability,Postural dysfunction,Impaired flexibility,Decreased range of motion  Visit Diagnosis: Cervicalgia  Muscle weakness (generalized)  Abnormal posture  Cervicogenic headache     Problem List Patient Active  Problem List   Diagnosis Date Noted  . Vaso vagal episode 12/12/2019  . Closed posterior dislocation of right elbow 12/12/2019  . Essential hypertension 12/12/2019  . History of kidney disease 06/11/2019  . Prediabetes   . Status post foot surgery 06/27/2013  . Other hammer toe (acquired) 06/10/2013  . Pain in foot 06/10/2013  . Onychomycosis 06/10/2013    Scot Jun, PT, DPT, OCS, ATC 09/23/20  10:51 AM    [facility] Physical Therapy [address] [place], [place], [address] Phone: [phone]   Fax:  [phone]  Name: [name] MRN: [id number] Date of Birth: 1945/09/18

## 2020-09-24 ENCOUNTER — Ambulatory Visit: Payer: Self-pay

## 2020-09-24 ENCOUNTER — Ambulatory Visit (HOSPITAL_COMMUNITY)
Admission: RE | Admit: 2020-09-24 | Discharge: 2020-09-24 | Disposition: A | Discharge status: Home / Self Care | Payer: Medicare Other | Source: Ambulatory Visit | Attending: Surgical | Admitting: Surgical

## 2020-09-24 ENCOUNTER — Ambulatory Visit: Payer: Medicare Other | Admitting: Surgical

## 2020-09-24 ENCOUNTER — Encounter: Payer: Self-pay | Admitting: Surgical

## 2020-09-24 DIAGNOSIS — M1711 Unilateral primary osteoarthritis, right knee: Secondary | ICD-10-CM

## 2020-09-24 DIAGNOSIS — M79671 Pain in right foot: Secondary | ICD-10-CM

## 2020-09-24 DIAGNOSIS — M7989 Other specified soft tissue disorders: Secondary | ICD-10-CM

## 2020-09-24 DIAGNOSIS — M79672 Pain in left foot: Secondary | ICD-10-CM

## 2020-09-25 ENCOUNTER — Encounter: Payer: Self-pay | Admitting: Surgical

## 2020-09-25 NOTE — Progress Notes (Signed)
Office Visit Note   Patient: [name]           Date of Birth: May 21, 1946           MRN: [id number] Visit Date: 09/24/2020 Requested by: Jilda Panda, MD [address] [place],  [place] [address] PCP: Jilda Panda, MD  Subjective: Chief Complaint  Patient presents with  . Right Knee - Follow-up, Pain  . Right Foot - Pain, Foot Swelling  . Left Foot - Pain, Foot Swelling    HPI: [name] is a 75 y.o. female who presents to the office complaining of right knee pain and left foot pain.  Patient has history of right knee pain with last injection was a gel injection on 08/11/2020.  She states that this provided fleeting relief.  She wears a brace for her knee which helps.  She feels like the knee feels weak due to pain.  Localizes most of her pain to the medial joint line.  She also complains of new onset bilateral leg swelling.  Denies any shortness of breath.  No history of blood clot.  She takes an aspirin 81 mg every day.  Additionally she complains of left foot pain that began several weeks ago.  Localizes pain to the midfoot both medially and dorsally.  Denies any history of injury.  She has had surgery in her right foot to correct a bunion but she has never had surgery on her left foot.  She is walking with a limp on occasion.  She is able to weight-bear on the foot..                ROS: All systems reviewed are negative as they relate to the chief complaint within the history of present illness.  Patient denies fevers or chills.  Assessment & Plan: Visit Diagnoses:  1. Leg swelling   2. Pain in left foot   3. Pain in right foot     Plan: Patient is a 75 year old female presents complaining of right knee pain and left foot pain.  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  Recommended she try Voltaren gel topically and hard soled shoes and will refer patient to Dr. Sharol Given for  evaluation and discussion of options.  Additionally, her right knee pain has returned.  She will injection was not helpful for a long period of time.  Plan to administer right knee cortisone injection today.  Patient tolerated the procedure well.  Next injection can be 3 to 4 months from now.  She also has bilateral calf pain that is new in onset with bilateral lower extremity swelling.  She does have calf tenderness on exam today.  No history of DVT but plan to order bilateral lower extremity ultrasound to rule out DVT.  These were found to be negative.  Follow-Up Instructions: Return for With Dr. Sharol Given.   Orders:  Orders Placed This Encounter  Procedures  . XR Foot Complete Left  . XR Foot Complete Right  . VAS [place] LOWER EXTREMITY VENOUS (DVT)   No orders of the defined types were placed in this encounter.     Procedures: No procedures performed   Clinical Data: No additional findings.  Objective: Vital Signs: LMP 08/14/1985 (Approximate)   Physical Exam:  Constitutional: Patient appears well-developed HEENT:  Head: Normocephalic Eyes:EOM are normal Neck: Normal range of motion Cardiovascular: Normal rate Pulmonary/chest: Effort normal Neurologic: Patient is alert Skin:  Skin is warm Psychiatric: Patient has normal mood and affect  Ortho Exam: Ortho exam demonstrates right knee with tenderness over the medial joint line.  Mild tenderness over the lateral joint line as well.  Tenderness throughout the bilateral calves diffusely with increased swelling in the left calf.  Negative Homans' sign bilaterally.  Right knee with 0 degrees extension and greater than 90 degrees of flexion.  No instability to varus/valgus stress.  No tenderness over the patella, quadricep tendon, patellar tendon.  Left foot with no tenderness over the medial/lateral malleoli.  Dorsiflexion, plantarflexion, inversion, eversion are intact actively.  Tenderness over the first tarsometatarsal joint medially  and dorsally.  No tenderness over the fifth metatarsal base, plantar fascia, other metatarsals, retrocalcaneal space, Achilles tendon.  Anterior tibialis tendon palpable and intact.  Specialty Comments:  No specialty comments available.  Imaging: VAS [place] LOWER EXTREMITY VENOUS (DVT)  Result Date: 09/24/2020  Lower Venous DVT Study Indications: Swelling.  Performing Technologist: Ralene Cork RVT  Examination Guidelines: A complete evaluation includes B-mode imaging, spectral Doppler, color Doppler, and power Doppler as needed of all accessible portions of each vessel. Bilateral testing is considered an integral part of a complete examination. Limited examinations for reoccurring indications may be performed as noted. The reflux portion of the exam is performed with the patient in reverse Trendelenburg.  +---------+---------------+---------+-----------+----------+--------------+ RIGHT    CompressibilityPhasicitySpontaneityPropertiesThrombus Aging +---------+---------------+---------+-----------+----------+--------------+ CFV      Full           Yes      Yes                                 +---------+---------------+---------+-----------+----------+--------------+ SFJ      Full                    Yes                                 +---------+---------------+---------+-----------+----------+--------------+ FV Prox  Full           Yes      Yes                                 +---------+---------------+---------+-----------+----------+--------------+ FV Mid   Full           Yes      Yes                                 +---------+---------------+---------+-----------+----------+--------------+ FV DistalFull           Yes      Yes                                 +---------+---------------+---------+-----------+----------+--------------+ POP      Full           Yes      Yes                                  +---------+---------------+---------+-----------+----------+--------------+ PTV      Full  Yes                                 +---------+---------------+---------+-----------+----------+--------------+ PERO     Full                    Yes                                 +---------+---------------+---------+-----------+----------+--------------+ GSV      Full           Yes      Yes                                 +---------+---------------+---------+-----------+----------+--------------+  +---------+---------------+---------+-----------+----------+--------------+ LEFT     CompressibilityPhasicitySpontaneityPropertiesThrombus Aging +---------+---------------+---------+-----------+----------+--------------+ CFV      Full           Yes      Yes                                 +---------+---------------+---------+-----------+----------+--------------+ SFJ      Full                    Yes                                 +---------+---------------+---------+-----------+----------+--------------+ FV Prox  Full           Yes      Yes                                 +---------+---------------+---------+-----------+----------+--------------+ FV Mid   Full           Yes      Yes                                 +---------+---------------+---------+-----------+----------+--------------+ FV DistalFull           Yes      Yes                                 +---------+---------------+---------+-----------+----------+--------------+ POP      Full                    Yes                                 +---------+---------------+---------+-----------+----------+--------------+ PTV      Full                    Yes                                 +---------+---------------+---------+-----------+----------+--------------+ PERO     Full                    Yes                                  +---------+---------------+---------+-----------+----------+--------------+  GSV      Full           Yes      Yes                                 +---------+---------------+---------+-----------+----------+--------------+  Summary: RIGHT: - There is no evidence of deep vein thrombosis in the lower extremity. - There is no evidence of superficial venous thrombosis.  LEFT: - There is no evidence of deep vein thrombosis in the lower extremity. - There is no evidence of superficial venous thrombosis. - A cystic structure is found in the popliteal fossa.  *See table(s) above for measurements and observations.    Preliminary      PMFS History: Patient Active Problem List   Diagnosis Date Noted  . Vaso vagal episode 12/12/2019  . Closed posterior dislocation of right elbow 12/12/2019  . Essential hypertension 12/12/2019  . History of kidney disease 06/11/2019  . Prediabetes   . Status post foot surgery 06/27/2013  . Other hammer toe (acquired) 06/10/2013  . Pain in foot 06/10/2013  . Onychomycosis 06/10/2013   Past Medical History:  Diagnosis Date  . COPD (chronic obstructive pulmonary disease) ([place])   . Glaucoma   . History of kidney disease   . History of kidney disease 06/11/2019  . History of recurrent UTIs   . Hyperlipidemia   . Hypertension   . Lichen sclerosus January 2017   vulva/perianal region  . Non-melanoma skin cancer 08/2015   right leg  . Osteoporosis   . Post-menopausal    . Prediabetes     Family History  Problem Relation Age of Onset  . Breast cancer Mother        deceased -87's  . Heart attack Father   . Heart attack Brother     Past Surgical History:  Procedure Laterality Date  . CLOSED REDUCTION ELBOW FRACTURE Right 12/12/2019   Procedure: CLOSED REDUCTION RIGHT ELBOW;  Surgeon: Shona Needles, MD;  Location: [place];  Service: Orthopedics;  Laterality: Right;  . EYE SURGERY Right    Stent placement  . FOOT SURGERY Right 06/2013   right 2nd toe  .  HEMATOMA EVACUATION Left 2007   left calf - excision of hemangioma with thrombus   Social History   Occupational History  . Not on file  Tobacco Use  . Smoking status: Never Smoker  . Smokeless tobacco: Never Used  Vaping Use  . Vaping Use: Never used  Substance and Sexual Activity  . Alcohol use: Not Currently  . Drug use: No  . Sexual activity: Not Currently    Birth control/protection: Post-menopausal

## 2020-09-27 ENCOUNTER — Telehealth: Payer: Self-pay

## 2020-09-27 NOTE — Telephone Encounter (Signed)
Per Lurena Joiner, please make sure patient has a follow up appointment with Dr Sharol Given to discuss left foot

## 2020-09-28 ENCOUNTER — Encounter: Payer: Medicare Other | Admitting: Rehabilitative and Restorative Service Providers"

## 2020-09-29 ENCOUNTER — Encounter: Payer: Self-pay | Admitting: Rehabilitative and Restorative Service Providers"

## 2020-09-29 ENCOUNTER — Telehealth: Payer: Self-pay

## 2020-09-29 ENCOUNTER — Other Ambulatory Visit: Payer: Self-pay

## 2020-09-29 ENCOUNTER — Ambulatory Visit: Payer: Medicare Other | Admitting: Rehabilitative and Restorative Service Providers"

## 2020-09-29 DIAGNOSIS — G4486 Cervicogenic headache: Secondary | ICD-10-CM | POA: Diagnosis not present

## 2020-09-29 DIAGNOSIS — R293 Abnormal posture: Secondary | ICD-10-CM

## 2020-09-29 DIAGNOSIS — M542 Cervicalgia: Secondary | ICD-10-CM

## 2020-09-29 DIAGNOSIS — M6281 Muscle weakness (generalized): Secondary | ICD-10-CM | POA: Diagnosis not present

## 2020-09-29 NOTE — Therapy (Signed)
[facility] Physical Therapy [address] [place], [place], [address] Phone: [phone]   Fax:  [phone]  Physical Therapy Treatment  Patient Details  Name: [name] MRN: [id number] Date of Birth: [birth year] Referring Provider (PT): Dr. Ernestina Patches   Encounter Date: 09/29/2020   PT End of Session - 09/29/20 1016    Visit Number 6    Number of Visits 12    Date for PT Re-Evaluation 10/22/20    Progress Note Due on Visit 10    PT Start Time 1013    PT Stop Time 1052    PT Time Calculation (min) 39 min    Activity Tolerance Patient tolerated treatment well    Behavior During Therapy [facility] for tasks assessed/performed           Past Medical History:  Diagnosis Date  . COPD (chronic obstructive pulmonary disease) ([place])   . Glaucoma   . History of kidney disease   . History of kidney disease 06/11/2019  . History of recurrent UTIs   . Hyperlipidemia   . Hypertension   . Lichen sclerosus January 2017   vulva/perianal region  . Non-melanoma skin cancer 08/2015   right leg  . Osteoporosis   . Post-menopausal    . Prediabetes     Past Surgical History:  Procedure Laterality Date  . CLOSED REDUCTION ELBOW FRACTURE Right 12/12/2019   Procedure: CLOSED REDUCTION RIGHT ELBOW;  Surgeon: Shona Needles, MD;  Location: [facility];  Service: Orthopedics;  Laterality: Right;  . EYE SURGERY Right    Stent placement  . FOOT SURGERY Right 06/2013   right 2nd toe  . HEMATOMA EVACUATION Left 2007   left calf - excision of hemangioma with thrombus    There were no vitals filed for this visit.   Subjective Assessment - 09/29/20 1014    Subjective Pt. stated having throbbing Rt headache, neck pain today that was severe and unchanging c HEP.    Pertinent History Trigger Point injections 06/08/20 c short term relief    Limitations Sitting;House hold activities    Patient Stated Goals Reduce pain    Currently in Pain? Yes    Pain Score 8     Pain Location  Neck    Pain Orientation Right;Upper;Posterior    Pain Descriptors / Indicators Throbbing;Aching    Pain Type Chronic pain    Pain Onset More than a month ago    Pain Frequency Constant    Aggravating Factors  insidious constant today                             [facility] Adult PT Treatment/Exercise - 09/29/20 0001      Neck Exercises: Machines for Strengthening   UBE (Upper Arm Bike) Lvl 3 3 mins fwd/back each way      Neck Exercises: Standing   Other Standing Exercises green tband rows 3 x 10      Neck Exercises: Supine   Shoulder ABduction Both   3 x 10 green   Other Supine Exercise UC flexion hold 5 sec x 15    Other Supine Exercise scap retraction 5 sec hold x 15      Moist Heat Therapy   Number Minutes Moist Heat 5 Minutes    Moist Heat Location Cervical   c supine ther ex     Manual Therapy   Manual therapy comments occipital release direct pressure, cervical distraction manual light  Trigger Point Dry Needling - 09/29/20 0001    Consent Given? Yes    Education Handout Provided Previously provided    Muscles Treated Head and Neck Suboccipitals   Rt   Suboccipitals Response Twitch response elicited                  PT Short Term Goals - 09/21/20 0936      PT SHORT TERM GOAL #1   Title Patient will demonstrate independent use of home exercise program to maintain progress from in clinic treatments.    Time 3    Period Weeks    Status Achieved    Target Date 09/17/20             PT Long Term Goals - 09/21/20 1004      PT LONG TERM GOAL #1   Title Patient will demonstrate/report pain at worst less than or equal to 2/10 to facilitate minimal limitation in daily activity secondary to pain symptoms.    Time 10    Period Weeks    Status On-going      PT LONG TERM GOAL #2   Title Patient will demonstrate independent use of home exercise program to facilitate ability to maintain/progress functional gains from skilled  physical therapy services.    Time 8    Period Weeks    Status On-going      PT LONG TERM GOAL #3   Title Patient will demonstrate cervical AROM WFL s symptoms to facilitate daily activity including driving, self care at PLOF s limitation due to symptoms.    Time 8    Period Weeks    Status On-going      PT LONG TERM GOAL #4   Title FOTO outcome > = 58% to indicated reduced disability.    Time 8    Period Weeks    Status Achieved                 Plan - 09/29/20 1023    Clinical Impression Statement Arrival today indicated exacerbation of symptoms, specifically on Rt suboccipital and superior cervical Rt paraspinals.  Increased manual focus today on trigger point release techniques to facilitate improved mobility and reduced symptoms in area.  Pt. indicated reduced severity of symptoms post treatment today.    Personal Factors and Comorbidities Comorbidity 3+    Comorbidities Osteoporosis, COPD, HTN, hyperlipidemia    Examination-Activity Limitations Sit;Sleep    Examination-Participation Restrictions Other   home activity   Stability/Clinical Decision Making Stable/Uncomplicated    Rehab Potential Good    PT Frequency --   1-2x/week   PT Duration 8 weeks    PT Treatment/Interventions ADLs/Self Care Home Management;Cryotherapy;Electrical Stimulation;Iontophoresis 4mg /ml Dexamethasone;Moist Heat;Balance training;Therapeutic exercise;Therapeutic activities;Functional mobility training;Stair training;Gait training;DME Instruction;Ultrasound;Neuromuscular re-education;Patient/family education;Manual techniques;Taping;Passive range of motion;Dry needling;Spinal Manipulations;Joint Manipulations    PT Next Visit Plan occipital release, cervical manual mobility gains, posterior upper thoracic/shoulder/cervical muscle activation.    PT Home Exercise Plan [id number]    Consulted and Agree with Plan of Care Patient           Patient will benefit from skilled therapeutic intervention  in order to improve the following deficits and impairments:  Hypomobility,Pain,Increased fascial restricitons,Decreased activity tolerance,Decreased mobility,Increased muscle spasms,Improper body mechanics,Impaired perceived functional ability,Postural dysfunction,Impaired flexibility,Decreased range of motion  Visit Diagnosis: Cervicalgia  Muscle weakness (generalized)  Abnormal posture  Cervicogenic headache     Problem List Patient Active Problem List   Diagnosis Date Noted  . Vaso vagal episode 12/12/2019  .  Closed posterior dislocation of right elbow 12/12/2019  . Essential hypertension 12/12/2019  . History of kidney disease 06/11/2019  . Prediabetes   . Status post foot surgery 06/27/2013  . Other hammer toe (acquired) 06/10/2013  . Pain in foot 06/10/2013  . Onychomycosis 06/10/2013    Scot Jun, PT, DPT, OCS, ATC 09/29/20  10:44 AM    [facility] Physical Therapy [address] [place], [place], [address] Phone: [phone]   Fax:  [phone]  Name: [name] MRN: [id number] Date of Birth: Oct 18, 1945

## 2020-09-29 NOTE — Telephone Encounter (Signed)
Patient came into the office she wants to know if the results came back regarding the test she had done 09/24/2020 for the blood clots she had in her legs call [phone]

## 2020-09-29 NOTE — Telephone Encounter (Signed)
Per [name], Called patient and advised negative for DVT. Advised her to follow up with PCP for continued leg swelling. She said she is to see them Friday and will discuss at that time.

## 2020-09-30 ENCOUNTER — Encounter: Payer: Self-pay | Admitting: Rehabilitative and Restorative Service Providers"

## 2020-09-30 ENCOUNTER — Ambulatory Visit: Payer: Medicare Other | Admitting: Rehabilitative and Restorative Service Providers"

## 2020-09-30 DIAGNOSIS — R293 Abnormal posture: Secondary | ICD-10-CM | POA: Diagnosis not present

## 2020-09-30 DIAGNOSIS — G4486 Cervicogenic headache: Secondary | ICD-10-CM

## 2020-09-30 DIAGNOSIS — M6281 Muscle weakness (generalized): Secondary | ICD-10-CM

## 2020-09-30 DIAGNOSIS — M542 Cervicalgia: Secondary | ICD-10-CM

## 2020-09-30 NOTE — Therapy (Signed)
[facility] Physical Therapy [address] [place], [place], [address] Phone: [phone]   Fax:  [phone]  Physical Therapy Treatment  Patient Details  Name: [name] MRN: [id number] Date of Birth: 11-13-1945 Referring Provider (PT): Dr. Ernestina Patches   Encounter Date: 09/30/2020   PT End of Session - 09/30/20 0937    Visit Number 7    Number of Visits 12    Date for PT Re-Evaluation 10/22/20    Progress Note Due on Visit 10    PT Start Time 0930    PT Stop Time 1010    PT Time Calculation (min) 40 min    Activity Tolerance Patient tolerated treatment well    Behavior During Therapy [facility] for tasks assessed/performed           Past Medical History:  Diagnosis Date  . COPD (chronic obstructive pulmonary disease) ([place])   . Glaucoma   . History of kidney disease   . History of kidney disease 06/11/2019  . History of recurrent UTIs   . Hyperlipidemia   . Hypertension   . Lichen sclerosus January 2017   vulva/perianal region  . Non-melanoma skin cancer 08/2015   right leg  . Osteoporosis   . Post-menopausal    . Prediabetes     Past Surgical History:  Procedure Laterality Date  . CLOSED REDUCTION ELBOW FRACTURE Right 12/12/2019   Procedure: CLOSED REDUCTION RIGHT ELBOW;  Surgeon: Shona Needles, MD;  Location: [place];  Service: Orthopedics;  Laterality: Right;  . EYE SURGERY Right    Stent placement  . FOOT SURGERY Right 06/2013   right 2nd toe  . HEMATOMA EVACUATION Left 2007   left calf - excision of hemangioma with thrombus    There were no vitals filed for this visit.   Subjective Assessment - 09/30/20 0936    Subjective Pt. indicated feeling improvement in symptoms after last visit until nighttime when she had pain up again to 8-10/10 at night.  Came into clinic reporting 5/10 or so on both sides of superior and middle posterior neck.    Pertinent History Trigger Point injections 06/08/20 c short term relief    Limitations  Sitting;House hold activities    Patient Stated Goals Reduce pain    Currently in Pain? Yes    Pain Score 5     Pain Location Neck    Pain Orientation Right;Left;Upper;Posterior    Pain Descriptors / Indicators Throbbing;Aching    Pain Type Chronic pain    Pain Onset More than a month ago    Aggravating Factors  nighttime pain    Pain Relieving Factors clinic visits have helped    Effect of Pain on Daily Activities Sleeping, head movement c pain is worse                             [facility] Adult PT Treatment/Exercise - 09/30/20 0001      Self-Care   Self-Care Posture    Posture At home postural cues for scapular retraction, throacic extension and cervical retraction to reduce shortened suboccipital musculature positioning.  Cues for self soft tissue mobilization c towel roll for area.      Neck Exercises: Machines for Strengthening   UBE (Upper Arm Bike) Lvl 3 6 fwd, 4 min rev      Neck Exercises: Supine   Other Supine Exercise UC flexion hold 5 sec x 5      Manual Therapy  Manual therapy comments occipital release direct pressure, cervical distraction manual light                    PT Short Term Goals - 09/21/20 0936      PT SHORT TERM GOAL #1   Title Patient will demonstrate independent use of home exercise program to maintain progress from in clinic treatments.    Time 3    Period Weeks    Status Achieved    Target Date 09/17/20             PT Long Term Goals - 09/21/20 1004      PT LONG TERM GOAL #1   Title Patient will demonstrate/report pain at worst less than or equal to 2/10 to facilitate minimal limitation in daily activity secondary to pain symptoms.    Time 10    Period Weeks    Status On-going      PT LONG TERM GOAL #2   Title Patient will demonstrate independent use of home exercise program to facilitate ability to maintain/progress functional gains from skilled physical therapy services.    Time 8    Period Weeks     Status On-going      PT LONG TERM GOAL #3   Title Patient will demonstrate cervical AROM WFL s symptoms to facilitate daily activity including driving, self care at PLOF s limitation due to symptoms.    Time 8    Period Weeks    Status On-going      PT LONG TERM GOAL #4   Title FOTO outcome > = 58% to indicated reduced disability.    Time 8    Period Weeks    Status Achieved                 Plan - 09/30/20 1018    Clinical Impression Statement Pt. continued today c increased symptoms compared to last week.  Less severity of symptoms indicated today compared to yesterday showing possible improvement progression but still trouble at night.  After extended manual intervention today, Pt. stated no pain in area.  Performed education on postural cues to help reduce sustained periods of upper cervical extension resulting from [employer]    Personal Factors and Comorbidities Comorbidity 3+    Comorbidities Osteoporosis, COPD, HTN, hyperlipidemia    Examination-Activity Limitations Sit;Sleep    Examination-Participation Restrictions Other   home activity   Stability/Clinical Decision Making Stable/Uncomplicated    Rehab Potential Good    PT Frequency --   1-2x/week   PT Duration 8 weeks    PT Treatment/Interventions ADLs/Self Care Home Management;Cryotherapy;Electrical Stimulation;Iontophoresis 4mg /ml Dexamethasone;Moist Heat;Balance training;Therapeutic exercise;Therapeutic activities;Functional mobility training;Stair training;Gait training;DME Instruction;Ultrasound;Neuromuscular re-education;Patient/family education;Manual techniques;Taping;Passive range of motion;Dry needling;Spinal Manipulations;Joint Manipulations    PT Next Visit Plan Cervical manual traction, subocciptal release techniques paired c continued postural activation intervention    PT Home Exercise Plan [id number]    Consulted and Agree with Plan of Care Patient           Patient will benefit from skilled therapeutic  intervention in order to improve the following deficits and impairments:  Hypomobility,Pain,Increased fascial restricitons,Decreased activity tolerance,Decreased mobility,Increased muscle spasms,Improper body mechanics,Impaired perceived functional ability,Postural dysfunction,Impaired flexibility,Decreased range of motion  Visit Diagnosis: Cervicalgia  Muscle weakness (generalized)  Abnormal posture  Cervicogenic headache     Problem List Patient Active Problem List   Diagnosis Date Noted  . Vaso vagal episode 12/12/2019  . Closed posterior dislocation of right elbow 12/12/2019  .  Essential hypertension 12/12/2019  . History of kidney disease 06/11/2019  . Prediabetes   . Status post foot surgery 06/27/2013  . Other hammer toe (acquired) 06/10/2013  . Pain in foot 06/10/2013  . Onychomycosis 06/10/2013    Scot Jun, PT, DPT, OCS, ATC 09/30/20  10:21 AM    [facility] Physical Therapy [address] [place], [place], [address] Phone: [phone]   Fax:  [phone]  Name: [name] MRN: [id number] Date of Birth: 09-Aug-1946

## 2020-10-08 ENCOUNTER — Encounter: Payer: Medicare Other | Admitting: Rehabilitative and Restorative Service Providers"

## 2020-10-13 ENCOUNTER — Encounter: Payer: Self-pay | Admitting: Rehabilitative and Restorative Service Providers"

## 2020-10-13 ENCOUNTER — Ambulatory Visit: Payer: Medicare Other | Admitting: Rehabilitative and Restorative Service Providers"

## 2020-10-13 ENCOUNTER — Other Ambulatory Visit: Payer: Self-pay

## 2020-10-13 DIAGNOSIS — M6281 Muscle weakness (generalized): Secondary | ICD-10-CM | POA: Diagnosis not present

## 2020-10-13 DIAGNOSIS — M542 Cervicalgia: Secondary | ICD-10-CM | POA: Diagnosis not present

## 2020-10-13 DIAGNOSIS — G4486 Cervicogenic headache: Secondary | ICD-10-CM | POA: Diagnosis not present

## 2020-10-13 DIAGNOSIS — R293 Abnormal posture: Secondary | ICD-10-CM | POA: Diagnosis not present

## 2020-10-13 NOTE — Therapy (Signed)
[facility] Physical Therapy [address] [place], [place], [address] Phone: [phone]   Fax:  [phone]  Physical Therapy Treatment  Patient Details  Name: [name] MRN: [id number] Date of Birth: 1946/01/13 Referring Provider (PT): Dr. Ernestina Patches   Encounter Date: 10/13/2020   PT End of Session - 10/13/20 0954    Visit Number 8    Number of Visits 12    Date for PT Re-Evaluation 10/22/20    Progress Note Due on Visit 10    PT Start Time 0926    PT Stop Time 1005    PT Time Calculation (min) 39 min    Activity Tolerance Patient tolerated treatment well    Behavior During Therapy [facility] for tasks assessed/performed           Past Medical History:  Diagnosis Date  . COPD (chronic obstructive pulmonary disease) ([place])   . Glaucoma   . History of kidney disease   . History of kidney disease 06/11/2019  . History of recurrent UTIs   . Hyperlipidemia   . Hypertension   . Lichen sclerosus January 2017   vulva/perianal region  . Non-melanoma skin cancer 08/2015   right leg  . Osteoporosis   . Post-menopausal    . Prediabetes     Past Surgical History:  Procedure Laterality Date  . CLOSED REDUCTION ELBOW FRACTURE Right 12/12/2019   Procedure: CLOSED REDUCTION RIGHT ELBOW;  Surgeon: Shona Needles, MD;  Location: [place];  Service: Orthopedics;  Laterality: Right;  . EYE SURGERY Right    Stent placement  . FOOT SURGERY Right 06/2013   right 2nd toe  . HEMATOMA EVACUATION Left 2007   left calf - excision of hemangioma with thrombus    There were no vitals filed for this visit.   Subjective Assessment - 10/13/20 0947    Subjective Pt. stated feeling like she had some less complaints in superior cervical region. Pt. stated having less severe pain but still noted in bilateral lower and middle neck primary.    Pertinent History Trigger Point injections 06/08/20 c short term relief    Limitations Sitting;House hold activities    Patient Stated  Goals Reduce pain    Currently in Pain? Yes    Pain Score 4     Pain Location Neck    Pain Orientation Right;Left;Posterior;Mid;Lower;Upper    Pain Descriptors / Indicators Throbbing;Aching    Pain Onset More than a month ago    Pain Frequency Intermittent    Aggravating Factors  nighttime, morning, head turns sometimes    Pain Relieving Factors HEP, dry needling              [facility] PT Assessment - 10/13/20 0001      Assessment   Medical Diagnosis Cervicalgia, occipital headaches    Referring Provider (PT) Dr. Ernestina Patches    Onset Date/Surgical Date 08/15/19    Hand Dominance Right      AROM   Cervical Flexion 43    Cervical Extension 48    Cervical - Right Rotation 52    Cervical - Left Rotation 52      Palpation   Palpation comment suboccipital trigger points, bilateral upper trap TrP, cervical paraspinals TrP noted                         [facility] Adult PT Treatment/Exercise - 10/13/20 0001      Neck Exercises: Supine   Cervical Rotation Both;10 reps  each way   Other Supine Exercise UC flexion hold 5 sec x 10    Other Supine Exercise scap retraction 5 sec hold x 10, Gh extension 5 sec hold x 10      Moist Heat Therapy   Number Minutes Moist Heat 10 Minutes    Moist Heat Location Cervical   with supine ther ex           Trigger Point Dry Needling - 10/13/20 0001    Consent Given? Yes    Education Handout Provided Previously provided    Muscles Treated Head and Neck Upper trapezius   bilateral   Upper Trapezius Response Twitch reponse elicited    Suboccipitals Response Twitch response elicited                  PT Short Term Goals - 09/21/20 0936      PT SHORT TERM GOAL #1   Title Patient will demonstrate independent use of home exercise program to maintain progress from in clinic treatments.    Time 3    Period Weeks    Status Achieved    Target Date 09/17/20             PT Long Term Goals - 10/13/20 0949      PT LONG TERM  GOAL #1   Title Patient will demonstrate/report pain at worst less than or equal to 2/10 to facilitate minimal limitation in daily activity secondary to pain symptoms.    Time 10    Period Weeks    Status On-going    Target Date 11/05/20      PT LONG TERM GOAL #2   Title Patient will demonstrate independent use of home exercise program to facilitate ability to maintain/progress functional gains from skilled physical therapy services.    Time 8    Period Weeks    Status On-going    Target Date 11/05/20      PT LONG TERM GOAL #3   Title Patient will demonstrate cervical AROM WFL s symptoms to facilitate daily activity including driving, self care at PLOF s limitation due to symptoms.    Time 8    Period Weeks    Status On-going    Target Date 11/05/20      PT LONG TERM GOAL #4   Title FOTO outcome > = 58% to indicated reduced disability.    Time 8    Period Weeks    Status Revised    Target Date 11/05/20                 Plan - 10/13/20 0949    Clinical Impression Statement Pt. demonstrated mostly similar cervical ROM compared to previously documented update (still improved compared to evaluation).  Pt. continued to report and struggle with complaints in bilateral neck but more noted in mid and lower in last few days.  Upper trap tightness, thoracic kyphosis increase c hypomobility contribute to presentation today.  Plan to continue to improve posterior chain thoracic, shoulder, cervical muscle activation for postural support while working on myofascial related symptoms as documented.    Personal Factors and Comorbidities Comorbidity 3+    Comorbidities Osteoporosis, COPD, HTN, hyperlipidemia    Examination-Activity Limitations Sit;Sleep    Examination-Participation Restrictions Other   home activity   Stability/Clinical Decision Making Stable/Uncomplicated    Rehab Potential Good    PT Frequency --   1-2x/week   PT Duration 8 weeks    PT Treatment/Interventions ADLs/Self  Care Home  Management;Cryotherapy;Electrical Stimulation;Iontophoresis 4mg /ml Dexamethasone;Moist Heat;Balance training;Therapeutic exercise;Therapeutic activities;Functional mobility training;Stair training;Gait training;DME Instruction;Ultrasound;Neuromuscular re-education;Patient/family education;Manual techniques;Taping;Passive range of motion;Dry needling;Spinal Manipulations;Joint Manipulations    PT Next Visit Plan Cervical manual traction, subocciptal release/upper trap techniques paired c continued postural activation intervention    PT Home Exercise Plan [id number]    Consulted and Agree with Plan of Care Patient           Patient will benefit from skilled therapeutic intervention in order to improve the following deficits and impairments:  Hypomobility,Pain,Increased fascial restricitons,Decreased activity tolerance,Decreased mobility,Increased muscle spasms,Improper body mechanics,Impaired perceived functional ability,Postural dysfunction,Impaired flexibility,Decreased range of motion  Visit Diagnosis: Cervicalgia  Muscle weakness (generalized)  Abnormal posture  Cervicogenic headache     Problem List Patient Active Problem List   Diagnosis Date Noted  . Vaso vagal episode 12/12/2019  . Closed posterior dislocation of right elbow 12/12/2019  . Essential hypertension 12/12/2019  . History of kidney disease 06/11/2019  . Prediabetes   . Status post foot surgery 06/27/2013  . Other hammer toe (acquired) 06/10/2013  . Pain in foot 06/10/2013  . Onychomycosis 06/10/2013    Scot Jun, PT, DPT, OCS, ATC 10/13/20  9:56 AM    [facility] Physical Therapy [address] [place], [place], [address] Phone: [phone]   Fax:  [phone]  Name: MAURIA ASQUITH MRN: [id number] Date of Birth: [birth year]

## 2020-10-20 ENCOUNTER — Ambulatory Visit: Payer: Medicare Other | Admitting: Rehabilitative and Restorative Service Providers"

## 2020-10-20 ENCOUNTER — Encounter: Payer: Self-pay | Admitting: Rehabilitative and Restorative Service Providers"

## 2020-10-20 ENCOUNTER — Other Ambulatory Visit: Payer: Self-pay

## 2020-10-20 DIAGNOSIS — M6281 Muscle weakness (generalized): Secondary | ICD-10-CM | POA: Diagnosis not present

## 2020-10-20 DIAGNOSIS — M542 Cervicalgia: Secondary | ICD-10-CM | POA: Diagnosis not present

## 2020-10-20 DIAGNOSIS — G4486 Cervicogenic headache: Secondary | ICD-10-CM

## 2020-10-20 DIAGNOSIS — R293 Abnormal posture: Secondary | ICD-10-CM | POA: Diagnosis not present

## 2020-10-20 NOTE — Therapy (Signed)
[facility] Physical Therapy [address] [place], [place], [address] Phone: [phone]   Fax:  [phone]  Physical Therapy Treatment  Patient Details  Name: [name] MRN: [id number] Date of Birth: Oct 29, 1945 Referring Provider (PT): Dr. Ernestina Patches   Encounter Date: 10/20/2020   PT End of Session - 10/20/20 0941    Visit Number 9    Number of Visits 12    Date for PT Re-Evaluation 10/22/20    Progress Note Due on Visit 10    PT Start Time 0930    PT Stop Time 1010    PT Time Calculation (min) 40 min    Activity Tolerance Patient tolerated treatment well    Behavior During Therapy [facility] for tasks assessed/performed           Past Medical History:  Diagnosis Date  . COPD (chronic obstructive pulmonary disease) ([place])   . Glaucoma   . History of kidney disease   . History of kidney disease 06/11/2019  . History of recurrent UTIs   . Hyperlipidemia   . Hypertension   . Lichen sclerosus January 2017   vulva/perianal region  . Non-melanoma skin cancer 08/2015   right leg  . Osteoporosis   . Post-menopausal    . Prediabetes     Past Surgical History:  Procedure Laterality Date  . CLOSED REDUCTION ELBOW FRACTURE Right 12/12/2019   Procedure: CLOSED REDUCTION RIGHT ELBOW;  Surgeon: Shona Needles, MD;  Location: [place];  Service: Orthopedics;  Laterality: Right;  . EYE SURGERY Right    Stent placement  . FOOT SURGERY Right 06/2013   right 2nd toe  . HEMATOMA EVACUATION Left 2007   left calf - excision of hemangioma with thrombus    There were no vitals filed for this visit.   Subjective Assessment - 10/20/20 0938    Subjective Pt. stated she felt like she did better after last visit.  Pt. stated weather in last day or so had created soreness and ache all over but not really in neck.  Some soreness at 3/10 reported today but not rated as "pain".    Pertinent History Trigger Point injections 06/08/20 c short term relief    Limitations  Sitting;House hold activities    Patient Stated Goals Reduce pain    Currently in Pain? No/denies    Pain Score 3    soreness   Pain Location Neck    Pain Orientation Right;Left;Mid;Lower;Upper    Pain Descriptors / Indicators Sore    Pain Type Chronic pain    Pain Onset More than a month ago    Pain Frequency Intermittent    Aggravating Factors  nighttime/morning is most notable time    Pain Relieving Factors Last visit helped.                             [place] Adult PT Treatment/Exercise - 10/20/20 0001      Neck Exercises: Machines for Strengthening   UBE (Upper Arm Bike) Lvl 3.0 5 mins fwd/back each way      Neck Exercises: Supine   Other Supine Exercise UC flexion hold 5 sec x 10    Other Supine Exercise scap retraction 5 sec hold x 10, Gh extension 5 sec hold x 10      Manual Therapy   Manual therapy comments occipital release direct pressure, cervical distraction manual light            Trigger  Point Dry Needling - 10/20/20 0001    Consent Given? Yes    Education Handout Provided Previously provided    Muscles Treated Head and Neck Upper trapezius    Upper Trapezius Response Twitch reponse elicited   rt                 PT Short Term Goals - 09/21/20 0936      PT SHORT TERM GOAL #1   Title Patient will demonstrate independent use of home exercise program to maintain progress from in clinic treatments.    Time 3    Period Weeks    Status Achieved    Target Date 09/17/20             PT Long Term Goals - 10/13/20 0949      PT LONG TERM GOAL #1   Title Patient will demonstrate/report pain at worst less than or equal to 2/10 to facilitate minimal limitation in daily activity secondary to pain symptoms.    Time 10    Period Weeks    Status On-going    Target Date 11/05/20      PT LONG TERM GOAL #2   Title Patient will demonstrate independent use of home exercise program to facilitate ability to maintain/progress functional gains  from skilled physical therapy services.    Time 8    Period Weeks    Status On-going    Target Date 11/05/20      PT LONG TERM GOAL #3   Title Patient will demonstrate cervical AROM WFL s symptoms to facilitate daily activity including driving, self care at PLOF s limitation due to symptoms.    Time 8    Period Weeks    Status On-going    Target Date 11/05/20      PT LONG TERM GOAL #4   Title FOTO outcome > = 58% to indicated reduced disability.    Time 8    Period Weeks    Status Revised    Target Date 11/05/20                 Plan - 10/20/20 0941    Clinical Impression Statement Benefits in symptom reduction noted from upper trap release techniques from last visit, continued in part today to continue progression.  Long term plan of HEP based strengthening, mobility for symptom management and continued postural improvements to help reduce strain on affected areas warranted, working towards at this time.    Personal Factors and Comorbidities Comorbidity 3+    Comorbidities Osteoporosis, COPD, HTN, hyperlipidemia    Examination-Activity Limitations Sit;Sleep    Examination-Participation Restrictions Other   home activity   Stability/Clinical Decision Making Stable/Uncomplicated    Rehab Potential Good    PT Frequency --   1-2x/week   PT Duration 8 weeks    PT Treatment/Interventions ADLs/Self Care Home Management;Cryotherapy;Electrical Stimulation;Iontophoresis 4mg /ml Dexamethasone;Moist Heat;Balance training;Therapeutic exercise;Therapeutic activities;Functional mobility training;Stair training;Gait training;DME Instruction;Ultrasound;Neuromuscular re-education;Patient/family education;Manual techniques;Taping;Passive range of motion;Dry needling;Spinal Manipulations;Joint Manipulations    PT Next Visit Plan Recertification next visit, dn as desired, occipital release.  General cervicothoracic mobility/strengthening for postural support.    PT Home Exercise Plan [id number]     Consulted and Agree with Plan of Care Patient           Patient will benefit from skilled therapeutic intervention in order to improve the following deficits and impairments:  Hypomobility,Pain,Increased fascial restricitons,Decreased activity tolerance,Decreased mobility,Increased muscle spasms,Improper body mechanics,Impaired perceived functional ability,Postural dysfunction,Impaired flexibility,Decreased range of motion  Visit Diagnosis: Cervicalgia  Muscle weakness (generalized)  Abnormal posture  Cervicogenic headache     Problem List Patient Active Problem List   Diagnosis Date Noted  . Vaso vagal episode 12/12/2019  . Closed posterior dislocation of right elbow 12/12/2019  . Essential hypertension 12/12/2019  . History of kidney disease 06/11/2019  . Prediabetes   . Status post foot surgery 06/27/2013  . Other hammer toe (acquired) 06/10/2013  . Pain in foot 06/10/2013  . Onychomycosis 06/10/2013    Scot Jun, PT, DPT, OCS, ATC 10/20/20  10:10 AM    [facility] Physical Therapy [address] [place], [place], [address] Phone: [phone]   Fax:  [phone]  Name: [name] MRN: [id number] Date of Birth: 11/21/1945

## 2020-10-27 ENCOUNTER — Encounter: Payer: Self-pay | Admitting: Rehabilitative and Restorative Service Providers"

## 2020-10-27 ENCOUNTER — Ambulatory Visit: Payer: Medicare Other | Admitting: Rehabilitative and Restorative Service Providers"

## 2020-10-27 ENCOUNTER — Other Ambulatory Visit: Payer: Self-pay

## 2020-10-27 DIAGNOSIS — G4486 Cervicogenic headache: Secondary | ICD-10-CM

## 2020-10-27 DIAGNOSIS — R293 Abnormal posture: Secondary | ICD-10-CM

## 2020-10-27 DIAGNOSIS — M6281 Muscle weakness (generalized): Secondary | ICD-10-CM | POA: Diagnosis not present

## 2020-10-27 DIAGNOSIS — M542 Cervicalgia: Secondary | ICD-10-CM

## 2020-10-27 NOTE — Therapy (Signed)
[facility] Physical Therapy [address] [place], [place], [address] Phone: [phone]   Fax:  [phone]  Physical Therapy Treatment/Recertification Progress Note  Patient Details  Name: [name] MRN: [id number] Date of Birth: 02-Aug-1946 Referring Provider (PT): Dr. Ernestina Patches   Encounter Date: 10/27/2020   Progress Note Reporting Period 08/27/2020 to 10/27/2020  See note below for Objective Data and Assessment of Progress/Goals.        PT End of Session - 10/27/20 0926    Visit Number 10    Number of Visits 16    Date for PT Re-Evaluation 12/08/20    Progress Note Due on Visit 20    PT Start Time 0922    PT Stop Time 1000    PT Time Calculation (min) 38 min    Activity Tolerance Patient tolerated treatment well    Behavior During Therapy [facility] for tasks assessed/performed           Past Medical History:  Diagnosis Date  . COPD (chronic obstructive pulmonary disease) ([place])   . Glaucoma   . History of kidney disease   . History of kidney disease 06/11/2019  . History of recurrent UTIs   . Hyperlipidemia   . Hypertension   . Lichen sclerosus January 2017   vulva/perianal region  . Non-melanoma skin cancer 08/2015   right leg  . Osteoporosis   . Post-menopausal    . Prediabetes     Past Surgical History:  Procedure Laterality Date  . CLOSED REDUCTION ELBOW FRACTURE Right 12/12/2019   Procedure: CLOSED REDUCTION RIGHT ELBOW;  Surgeon: Shona Needles, MD;  Location: [place];  Service: Orthopedics;  Laterality: Right;  . EYE SURGERY Right    Stent placement  . FOOT SURGERY Right 06/2013   right 2nd toe  . HEMATOMA EVACUATION Left 2007   left calf - excision of hemangioma with thrombus    There were no vitals filed for this visit.   Subjective Assessment - 10/27/20 0924    Subjective Pt. indicated neck is doing better.  Pt. stated some Rt sided mid cervical pain c turning head to Rt at 7/10 or so but nothing at rest.  GROC +3  overall at this time.    Pertinent History Trigger Point injections 06/08/20 c short term relief    Limitations Sitting;House hold activities    Patient Stated Goals Reduce pain    Currently in Pain? Yes    Pain Score 7    7/10 c movement   Pain Location Neck    Pain Orientation Right;Mid    Pain Descriptors / Indicators Sharp    Pain Type Chronic pain    Pain Onset More than a month ago    Pain Frequency Occasional    Aggravating Factors  Rt head turning    Pain Relieving Factors treatment has helped, movement in HEP              [facility] PT Assessment - 10/27/20 0001      Assessment   Medical Diagnosis Cervicalgia, occipital headaches    Referring Provider (PT) Dr. Ernestina Patches    Onset Date/Surgical Date 08/15/19    Hand Dominance Right      Observation/Other Assessments   Focus on Therapeutic Outcomes (FOTO)  update 59%      AROM   Cervical Flexion 55    Cervical Extension 45    Cervical - Right Rotation 56    Cervical - Left Rotation 56  [place] Adult PT Treatment/Exercise - 10/27/20 0001      Neck Exercises: Machines for Strengthening   UBE (Upper Arm Bike) Lvl 3.0 6 mins fwd, 2 mins reverse      Neck Exercises: Theraband   Other Theraband Exercises green rows, gh ext 2 x 15 each way      Neck Exercises: Supine   Other Supine Exercise UC flexion hold 5 sec x 10    Other Supine Exercise scap retraction 5 sec hold x 10, Gh extension 5 sec hold x 10      Manual Therapy   Manual therapy comments supine Rt mid cervical downslope g3 mobs                    PT Short Term Goals - 09/21/20 6967      PT SHORT TERM GOAL #1   Title Patient will demonstrate independent use of home exercise program to maintain progress from in clinic treatments.    Time 3    Period Weeks    Status Achieved    Target Date 09/17/20             PT Long Term Goals - 10/13/20 0949      PT LONG TERM GOAL #1   Title Patient will  demonstrate/report pain at worst less than or equal to 2/10 to facilitate minimal limitation in daily activity secondary to pain symptoms.    Time 10    Period Weeks    Status On-going    Target Date 11/05/20      PT LONG TERM GOAL #2   Title Patient will demonstrate independent use of home exercise program to facilitate ability to maintain/progress functional gains from skilled physical therapy services.    Time 8    Period Weeks    Status On-going    Target Date 11/05/20      PT LONG TERM GOAL #3   Title Patient will demonstrate cervical AROM WFL s symptoms to facilitate daily activity including driving, self care at PLOF s limitation due to symptoms.    Time 8    Period Weeks    Status On-going    Target Date 11/05/20      PT LONG TERM GOAL #4   Title FOTO outcome > = 58% to indicated reduced disability.    Time 8    Period Weeks    Status Revised    Target Date 11/05/20                 Plan - 10/27/20 8938    Clinical Impression Statement Pt. has attended 10 visits overall during course of treatment, reported less frequent symptoms compared to evaluation, GROC +3 at this time.  See objective data for updated information.  Pt. has demonstrated gains in mobility and overall tolerance to functional activity to this point.  Pt. may continue to benefit from skilled PT services to facilitate continued improvement in symptoms and reaching established goals.  Transitioning to HEP as appropriate.    Personal Factors and Comorbidities Comorbidity 3+    Comorbidities Osteoporosis, COPD, HTN, hyperlipidemia    Examination-Activity Limitations Sit;Sleep    Examination-Participation Restrictions Other   home activity   Stability/Clinical Decision Making Stable/Uncomplicated    Rehab Potential Good    PT Frequency 1x / week   1-2x/week   PT Duration 6 weeks    PT Treatment/Interventions ADLs/Self Care Home Management;Cryotherapy;Electrical Stimulation;Iontophoresis 4mg /ml  Dexamethasone;Moist Heat;Balance training;Therapeutic exercise;Therapeutic activities;Functional mobility training;Stair training;Gait training;DME  Instruction;Ultrasound;Neuromuscular re-education;Patient/family education;Manual techniques;Taping;Passive range of motion;Dry needling;Spinal Manipulations;Joint Manipulations    PT Next Visit Plan Reassess Rt cervical mobility from today's intervention.   General cervicothoracic mobility/strengthening for postural support.    PT Home Exercise Plan [id number]    Consulted and Agree with Plan of Care Patient           Patient will benefit from skilled therapeutic intervention in order to improve the following deficits and impairments:  Hypomobility,Pain,Increased fascial restricitons,Decreased activity tolerance,Decreased mobility,Increased muscle spasms,Improper body mechanics,Impaired perceived functional ability,Postural dysfunction,Impaired flexibility,Decreased range of motion  Visit Diagnosis: Cervicalgia  Muscle weakness (generalized)  Abnormal posture  Cervicogenic headache     Problem List Patient Active Problem List   Diagnosis Date Noted  . Vaso vagal episode 12/12/2019  . Closed posterior dislocation of right elbow 12/12/2019  . Essential hypertension 12/12/2019  . History of kidney disease 06/11/2019  . Prediabetes   . Status post foot surgery 06/27/2013  . Other hammer toe (acquired) 06/10/2013  . Pain in foot 06/10/2013  . Onychomycosis 06/10/2013    Scot Jun, PT, DPT, OCS, ATC 10/27/20  9:50 AM    [facility] Physical Therapy [address] [place], [place], [address] Phone: [phone]   Fax:  [phone]  Name: [name] MRN: [id number] Date of Birth: 11/09/1945

## 2020-11-01 ENCOUNTER — Other Ambulatory Visit: Payer: Self-pay

## 2020-11-01 ENCOUNTER — Telehealth: Payer: Self-pay

## 2020-11-01 ENCOUNTER — Ambulatory Visit: Payer: Medicare Other | Admitting: Orthopedic Surgery

## 2020-11-01 DIAGNOSIS — M1711 Unilateral primary osteoarthritis, right knee: Secondary | ICD-10-CM | POA: Diagnosis not present

## 2020-11-01 MED ORDER — TRAMADOL HCL 50 MG PO TABS: 50.0000 mg | ORAL_TABLET | Freq: Every evening | ORAL | 0 refills | Status: DC | PRN

## 2020-11-01 NOTE — Telephone Encounter (Signed)
Patient would like to know cost for Euflexxa if her insurance does not cover. She has had Euflexxa in the past with good relief.

## 2020-11-01 NOTE — Telephone Encounter (Signed)
Advised patient that I will talk with Roby Lofts. Concerning the correct price for the gel injection.

## 2020-11-02 ENCOUNTER — Encounter: Payer: Self-pay | Admitting: Orthopedic Surgery

## 2020-11-02 NOTE — Progress Notes (Signed)
Office Visit Note   Patient: [name]           Date of Birth: Dec 05, 1945           MRN: [id number] Visit Date: 11/01/2020 Requested by: Jilda Panda, MD [id number] [place],  [place] [address] PCP: Jilda Panda, MD  Subjective: Chief Complaint  Patient presents with  . Right Knee - Pain    HPI: Siboney is a 75 year old patient with right knee arthritis.  She had cortisone injection 09/24/2020 which did not give her much relief.  She does report decent relief with earlier gel injection Euflexxa done several years ago.  Knee brace is not helpful.  She is in physical therapy.              ROS: All systems reviewed are negative as they relate to the chief complaint within the history of present illness.  Patient denies  fevers or chills.   Assessment & Plan: Visit Diagnoses:  1. Unilateral primary osteoarthritis, right knee     Plan: Impression is right knee pain with arthritis present.  Range of motion is diminishing some in extension.  None operative modalities have not been very helpful.  We will try for out-of-pocket Euflexxa.  April will call her with the price.  This is per patient request.  Ultram provided to help as needed.  Follow-up if the Euflexxa injection mechanics are worked out.  Follow-Up Instructions: Return if symptoms worsen or fail to improve.   Orders:  No orders of the defined types were placed in this encounter.  Meds ordered this encounter  Medications  . traMADol (ULTRAM) 50 MG tablet    Sig: Take 1 tablet (50 mg total) by mouth at bedtime as needed.    Dispense:  30 tablet    Refill:  0      Procedures: No procedures performed   Clinical Data: No additional findings.  Objective: Vital Signs: LMP 08/14/1985 (Approximate)   Physical Exam:   Constitutional: Patient appears well-developed HEENT:  Head: Normocephalic Eyes:EOM are normal Neck: Normal range of motion Cardiovascular: Normal rate Pulmonary/chest: Effort  normal Neurologic: Patient is alert Skin: Skin is warm Psychiatric: Patient has normal mood and affect    Ortho Exam: Ortho exam demonstrates full active and passive range of motion of the left knee right knee range of motion 10-1 05.  Collateral crucial ligaments are stable.  No effusion.  Does have medial greater than lateral joint line tenderness.  Pedal pulses palpable.  No groin pain with internal or external Tat[place] of the leg.  Skin is intact in the right knee region.  Specialty Comments:  No specialty comments available.  Imaging: No results found.   PMFS History: Patient Active Problem List   Diagnosis Date Noted  . Vaso vagal episode 12/12/2019  . Closed posterior dislocation of right elbow 12/12/2019  . Essential hypertension 12/12/2019  . History of kidney disease 06/11/2019  . Prediabetes   . Status post foot surgery 06/27/2013  . Other hammer toe (acquired) 06/10/2013  . Pain in foot 06/10/2013  . Onychomycosis 06/10/2013   Past Medical History:  Diagnosis Date  . COPD (chronic obstructive pulmonary disease) ([place])   . Glaucoma   . History of kidney disease   . History of kidney disease 06/11/2019  . History of recurrent UTIs   . Hyperlipidemia   . Hypertension   . Lichen sclerosus January 2017   vulva/perianal region  . Non-melanoma skin cancer 08/2015   right  leg  . Osteoporosis   . Post-menopausal    . Prediabetes     Family History  Problem Relation Age of Onset  . Breast cancer Mother        deceased -41's  . Heart attack Father   . Heart attack Brother     Past Surgical History:  Procedure Laterality Date  . CLOSED REDUCTION ELBOW FRACTURE Right 12/12/2019   Procedure: CLOSED REDUCTION RIGHT ELBOW;  Surgeon: Shona Needles, MD;  Location: [place];  Service: Orthopedics;  Laterality: Right;  . EYE SURGERY Right    Stent placement  . FOOT SURGERY Right 06/2013   right 2nd toe  . HEMATOMA EVACUATION Left 2007   left calf - excision of  hemangioma with thrombus   Social History   Occupational History  . Not on file  Tobacco Use  . Smoking status: Never Smoker  . Smokeless tobacco: Never Used  Vaping Use  . Vaping Use: Never used  Substance and Sexual Activity  . Alcohol use: Not Currently  . Drug use: No  . Sexual activity: Not Currently    Birth control/protection: Post-menopausal

## 2020-11-02 NOTE — Telephone Encounter (Signed)
Called and left a VM advising patient to CB concerning gel injection price.

## 2020-11-03 ENCOUNTER — Other Ambulatory Visit: Payer: Self-pay | Admitting: Surgical

## 2020-11-03 ENCOUNTER — Ambulatory Visit: Payer: Medicare Other | Admitting: Rehabilitative and Restorative Service Providers"

## 2020-11-03 ENCOUNTER — Telehealth: Payer: Self-pay | Admitting: Orthopedic Surgery

## 2020-11-03 ENCOUNTER — Encounter: Payer: Self-pay | Admitting: Rehabilitative and Restorative Service Providers"

## 2020-11-03 ENCOUNTER — Other Ambulatory Visit: Payer: Self-pay

## 2020-11-03 DIAGNOSIS — M6281 Muscle weakness (generalized): Secondary | ICD-10-CM

## 2020-11-03 DIAGNOSIS — M542 Cervicalgia: Secondary | ICD-10-CM

## 2020-11-03 DIAGNOSIS — G4486 Cervicogenic headache: Secondary | ICD-10-CM | POA: Diagnosis not present

## 2020-11-03 DIAGNOSIS — R293 Abnormal posture: Secondary | ICD-10-CM | POA: Diagnosis not present

## 2020-11-03 MED ORDER — TRAMADOL HCL 50 MG PO TABS: 50.0000 mg | ORAL_TABLET | Freq: Every evening | ORAL | 0 refills | Status: DC | PRN

## 2020-11-03 NOTE — Therapy (Signed)
[facility] Physical Therapy [address] [place], [place], [address] Phone: [phone]   Fax:  [phone]  Physical Therapy Treatment  Patient Details  Name: [name] MRN: [id number] Date of Birth: Oct 09, 1945 Referring Provider (PT): Dr. Ernestina Patches   Encounter Date: 11/03/2020   PT End of Session - 11/03/20 1022    Visit Number 11    Number of Visits 16    Date for PT Re-Evaluation 12/08/20    Progress Note Due on Visit 20    PT Start Time 1013    PT Stop Time 1053    PT Time Calculation (min) 40 min    Activity Tolerance Patient tolerated treatment well    Behavior During Therapy [facility] for tasks assessed/performed           Past Medical History:  Diagnosis Date  . COPD (chronic obstructive pulmonary disease) ([place])   . Glaucoma   . History of kidney disease   . History of kidney disease 06/11/2019  . History of recurrent UTIs   . Hyperlipidemia   . Hypertension   . Lichen sclerosus January 2017   vulva/perianal region  . Non-melanoma skin cancer 08/2015   right leg  . Osteoporosis   . Post-menopausal    . Prediabetes     Past Surgical History:  Procedure Laterality Date  . CLOSED REDUCTION ELBOW FRACTURE Right 12/12/2019   Procedure: CLOSED REDUCTION RIGHT ELBOW;  Surgeon: Shona Needles, MD;  Location: [place];  Service: Orthopedics;  Laterality: Right;  . EYE SURGERY Right    Stent placement  . FOOT SURGERY Right 06/2013   right 2nd toe  . HEMATOMA EVACUATION Left 2007   left calf - excision of hemangioma with thrombus    There were no vitals filed for this visit.   Subjective Assessment - 11/03/20 1020    Subjective Pt. indicated feeling Rt side of neck in one spot primarily.  Overall was doing better but neck and leg hurting some more today due to weather.    Pertinent History Trigger Point injections 06/08/20 c short term relief    Limitations Sitting;House hold activities    Patient Stated Goals Reduce pain    Currently  in Pain? Yes    Pain Score 5     Pain Location Neck    Pain Orientation Right;Mid;Upper    Pain Descriptors / Indicators Aching    Pain Onset More than a month ago    Pain Frequency Occasional    Aggravating Factors  morning, weather    Pain Relieving Factors HEP helped this morning                             [facility] Adult PT Treatment/Exercise - 11/03/20 0001      Exercises   Other Exercises  Continued use of HEP activity in clinic to improve performance      Neck Exercises: Machines for Strengthening   UBE (Upper Arm Bike) Lvl 3.0 5 mins fwd/ 5 mins backward      Neck Exercises: Supine   Cervical Rotation Both;10 reps    Other Supine Exercise UC flexion hold 5 sec x 10    Other Supine Exercise scap retraction 5 sec hold x 10, Gh extension 5 sec hold x 10      Moist Heat Therapy   Number Minutes Moist Heat 5 Minutes   c supine exercises   Moist Heat Location Cervical  Manual Therapy   Manual therapy comments compression to Rt paraspinals, supine occipital release            Trigger Point Dry Needling - 11/03/20 0001    Consent Given? Yes    Education Handout Provided Previously provided    Muscles Treated Head and Neck Suboccipitals    Suboccipitals Response Twitch response elicited                  PT Short Term Goals - 09/21/20 0936      PT SHORT TERM GOAL #1   Title Patient will demonstrate independent use of home exercise program to maintain progress from in clinic treatments.    Time 3    Period Weeks    Status Achieved    Target Date 09/17/20             PT Long Term Goals - 10/27/20 0949      PT LONG TERM GOAL #1   Title Patient will demonstrate/report pain at worst less than or equal to 2/10 to facilitate minimal limitation in daily activity secondary to pain symptoms.    Time 6    Period Weeks    Status Revised    Target Date 12/08/20      PT LONG TERM GOAL #2   Title Patient will demonstrate independent use of  home exercise program to facilitate ability to maintain/progress functional gains from skilled physical therapy services.    Time 8    Period Weeks    Status Revised    Target Date 12/08/20      PT LONG TERM GOAL #3   Title Patient will demonstrate cervical AROM WFL s symptoms to facilitate daily activity including driving, self care at PLOF s limitation due to symptoms.    Time 6    Period Weeks    Status Revised    Target Date 12/08/20      PT LONG TERM GOAL #4   Title FOTO outcome > = 58% to indicated reduced disability.    Time 8    Period Weeks    Status Achieved                 Plan - 11/03/20 1035    Clinical Impression Statement Pt. indicated more localized symptoms in Rt cervical today vs. previous widespread complaints.  Worsened c weather reporting was noted.  Pt. to benefit from manual today to reduce myofascial restriction/pain.  Continue to promote HEP transitioning.    Personal Factors and Comorbidities Comorbidity 3+    Comorbidities Osteoporosis, COPD, HTN, hyperlipidemia    Examination-Activity Limitations Sit;Sleep    Examination-Participation Restrictions Other   home activity   Stability/Clinical Decision Making Stable/Uncomplicated    Rehab Potential Good    PT Frequency 1x / week   1-2x/week   PT Duration 6 weeks    PT Treatment/Interventions ADLs/Self Care Home Management;Cryotherapy;Electrical Stimulation;Iontophoresis 4mg /ml Dexamethasone;Moist Heat;Balance training;Therapeutic exercise;Therapeutic activities;Functional mobility training;Stair training;Gait training;DME Instruction;Ultrasound;Neuromuscular re-education;Patient/family education;Manual techniques;Taping;Passive range of motion;Dry needling;Spinal Manipulations;Joint Manipulations    PT Next Visit Plan DN/manual prn.  General cervicothoracic mobility/strengthening for postural support.    PT Home Exercise Plan [email or web address]    Consulted and Agree with Plan of Care Patient            Patient will benefit from skilled therapeutic intervention in order to improve the following deficits and impairments:  Hypomobility,Pain,Increased fascial restricitons,Decreased activity tolerance,Decreased mobility,Increased muscle spasms,Improper body mechanics,Impaired perceived functional ability,Postural dysfunction,Impaired flexibility,Decreased range of motion  Visit Diagnosis: Cervicalgia  Muscle weakness (generalized)  Abnormal posture  Cervicogenic headache     Problem List Patient Active Problem List   Diagnosis Date Noted  . Vaso vagal episode 12/12/2019  . Closed posterior dislocation of right elbow 12/12/2019  . Essential hypertension 12/12/2019  . History of kidney disease 06/11/2019  . Prediabetes   . Status post foot surgery 06/27/2013  . Other hammer toe (acquired) 06/10/2013  . Pain in foot 06/10/2013  . Onychomycosis 06/10/2013    Scot Jun, PT, DPT, OCS, ATC 11/03/20  10:49 AM    [facility] Physical Therapy [address] [place], [place], [address] Phone: [phone]   Fax:  [phone]  Name: DARIELLE HANCHER MRN: [id number] Date of Birth: [birth year]

## 2020-11-03 NOTE — Telephone Encounter (Signed)
Patient called requesting her tramadol be sent to [facility] on [address]. Patient medication was sent to [facility] on [address]. Please switch and and call patient when sent to correct pharmacy. Patient phone number is [phone].

## 2020-11-03 NOTE — Telephone Encounter (Signed)
Sent to [facility] on [address]

## 2020-11-03 NOTE — Telephone Encounter (Signed)
Can you resubmit?

## 2020-11-10 ENCOUNTER — Ambulatory Visit: Payer: Medicare Other | Admitting: Rehabilitative and Restorative Service Providers"

## 2020-11-10 ENCOUNTER — Other Ambulatory Visit: Payer: Self-pay

## 2020-11-10 ENCOUNTER — Encounter: Payer: Self-pay | Admitting: Rehabilitative and Restorative Service Providers"

## 2020-11-10 DIAGNOSIS — M542 Cervicalgia: Secondary | ICD-10-CM

## 2020-11-10 DIAGNOSIS — M6281 Muscle weakness (generalized): Secondary | ICD-10-CM | POA: Diagnosis not present

## 2020-11-10 DIAGNOSIS — G4486 Cervicogenic headache: Secondary | ICD-10-CM | POA: Diagnosis not present

## 2020-11-10 DIAGNOSIS — R293 Abnormal posture: Secondary | ICD-10-CM

## 2020-11-10 NOTE — Therapy (Signed)
[facility] Physical Therapy [address] [place], [place], [address] Phone: [phone]   Fax:  [phone]  Physical Therapy Treatment/Discharge   Patient Details  Name: [name] MRN: [id number] Date of Birth: 08-25-1945 Referring Provider (PT): Dr. Ernestina Patches   Encounter Date: 11/10/2020  PHYSICAL THERAPY DISCHARGE SUMMARY  Visits from Start of Care: 12  Current functional level related to goals / functional outcomes: See note   Remaining deficits: See note   Education / Equipment: HEP Plan: Patient agrees to discharge.  Patient goals were partially met. Patient is being discharged due to meeting the stated rehab goals.  ?????  Pt. has demonstrated gains and has since leveled off in last few weeks.        PT End of Session - 11/10/20 0941    Visit Number 12    Number of Visits 16    Date for PT Re-Evaluation 12/08/20    Progress Note Due on Visit 20    PT Start Time 0930    PT Stop Time 1009    PT Time Calculation (min) 39 min    Activity Tolerance Patient tolerated treatment well    Behavior During Therapy WFL for tasks assessed/performed           Past Medical History:  Diagnosis Date  . COPD (chronic obstructive pulmonary disease) ([place])   . Glaucoma   . History of kidney disease   . History of kidney disease 06/11/2019  . History of recurrent UTIs   . Hyperlipidemia   . Hypertension   . Lichen sclerosus January 2017   vulva/perianal region  . Non-melanoma skin cancer 08/2015   right leg  . Osteoporosis   . Post-menopausal    . Prediabetes     Past Surgical History:  Procedure Laterality Date  . CLOSED REDUCTION ELBOW FRACTURE Right 12/12/2019   Procedure: CLOSED REDUCTION RIGHT ELBOW;  Surgeon: Shona Needles, MD;  Location: [facility];  Service: Orthopedics;  Laterality: Right;  . EYE SURGERY Right    Stent placement  . FOOT SURGERY Right 06/2013   right 2nd toe  . HEMATOMA EVACUATION Left 2007   left calf - excision  of hemangioma with thrombus    There were no vitals filed for this visit.   Subjective Assessment - 11/10/20 0936    Subjective Pt. stated she felt improvement in Rt side of neck for several days.  Still waking with variable complaints of neck/head at times, noted this morning.GROC rated +5 quite a bit better.    Pertinent History Trigger Point injections 06/08/20 c short term relief    Limitations Sitting;House hold activities    Patient Stated Goals Reduce pain    Currently in Pain? Yes    Pain Score 7     Pain Location Head   Rams horn type   Pain Orientation Left;Right    Pain Descriptors / Indicators Headache    Pain Type Chronic pain    Pain Onset More than a month ago    Pain Frequency Intermittent    Aggravating Factors  nighttime, morning, stress.    Pain Relieving Factors Treatment and HEP has helped.              [facility] PT Assessment - 11/10/20 0001      Assessment   Medical Diagnosis Cervicalgia, occipital headaches    Referring Provider (PT) Dr. Ernestina Patches    Onset Date/Surgical Date 08/15/19    Hand Dominance Right      Observation/Other Assessments  Focus on Therapeutic Outcomes (FOTO)  update 59% (from visit 10)      AROM   Cervical Flexion 55    Cervical Extension 45    Cervical - Right Rotation 70    Cervical - Left Rotation 65                         [facility] Adult PT Treatment/Exercise - 11/10/20 0001      Exercises   Other Exercises  HEP review for trial d/c.      Neck Exercises: Machines for Strengthening   UBE (Upper Arm Bike) Lvl 3 4 mins fwd/back each way      Neck Exercises: Theraband   Other Theraband Exercises green rows, gh ext 2 x 10 (HEP review) each    Other Theraband Exercises supine horizontal abduction green band 3 x 10      Neck Exercises: Supine   Other Supine Exercise UC flexion hold 5 sec x 10    Other Supine Exercise scap retraction 5 sec hold x 10, Gh extension 5 sec hold x 10      Manual Therapy   Manual  therapy comments compression to bilateral upper trap, g3 cPA mid thoracic            Trigger Point Dry Needling - 11/10/20 0001    Consent Given? Yes    Education Handout Provided Previously provided    Muscles Treated Head and Neck Upper trapezius   Lt and Rt   Upper Trapezius Response Twitch reponse elicited                PT Education - 11/10/20 0951    Education Details HEP review, D/C Plan    Person(s) Educated Patient    Methods Explanation;Demonstration;Verbal cues;Handout    Comprehension Returned demonstration;Verbalized understanding            PT Short Term Goals - 09/21/20 0936      PT SHORT TERM GOAL #1   Title Patient will demonstrate independent use of home exercise program to maintain progress from in clinic treatments.    Time 3    Period Weeks    Status Achieved    Target Date 09/17/20             PT Long Term Goals - 11/10/20 0951      PT LONG TERM GOAL #1   Title Patient will demonstrate/report pain at worst less than or equal to 2/10 to facilitate minimal limitation in daily activity secondary to pain symptoms.    Time 6    Period Weeks    Status Partially Met      PT LONG TERM GOAL #2   Title Patient will demonstrate independent use of home exercise program to facilitate ability to maintain/progress functional gains from skilled physical therapy services.    Time 8    Period Weeks    Status Achieved      PT LONG TERM GOAL #3   Title Patient will demonstrate cervical AROM WFL s symptoms to facilitate daily activity including driving, self care at PLOF s limitation due to symptoms.    Time 6    Period Weeks    Status Achieved      PT LONG TERM GOAL #4   Title FOTO outcome > = 58% to indicated reduced disability.    Time 8    Period Weeks    Status Achieved  Plan - 11/10/20 3606    Clinical Impression Statement Pt. has attended 12 visits overall during course of treatment, GROC +5 at this time.  Pt.  stated having night/morning headache, neck complaints at times still but reduced number and severity on average.  Pt. indicated periods of no complaints as well (much different from before).  See objective data for updated information.  Pt. presentation has stabilized overall c improvements noted but still showing some instances of symptoms.  Good knowledge of HEP and positive symptom reduction c use.  Pt. is appropriate for trial d/c to HEP at this time to help maintain and continue to progress in symptom reduction.  Pt. in agreement at this time.    Personal Factors and Comorbidities Comorbidity 3+    Comorbidities Osteoporosis, COPD, HTN, hyperlipidemia    Examination-Activity Limitations Sit;Sleep    Examination-Participation Restrictions Other   home activity   Stability/Clinical Decision Making Stable/Uncomplicated    Rehab Potential Good    PT Frequency --   1-2x/week   PT Treatment/Interventions ADLs/Self Care Home Management;Cryotherapy;Electrical Stimulation;Iontophoresis 71m/ml Dexamethasone;Moist Heat;Balance training;Therapeutic exercise;Therapeutic activities;Functional mobility training;Stair training;Gait training;DME Instruction;Ultrasound;Neuromuscular re-education;Patient/family education;Manual techniques;Taping;Passive range of motion;Dry needling;Spinal Manipulations;Joint Manipulations    PT Next Visit Plan Return prn in future if symptoms worsening    PT Home Exercise Plan [email or web address]    Consulted and Agree with Plan of Care Patient           Patient will benefit from skilled therapeutic intervention in order to improve the following deficits and impairments:  Hypomobility,Pain,Increased fascial restricitons,Decreased activity tolerance,Decreased mobility,Increased muscle spasms,Improper body mechanics,Impaired perceived functional ability,Postural dysfunction,Impaired flexibility,Decreased range of motion  Visit Diagnosis: Cervicalgia  Muscle weakness  (generalized)  Abnormal posture  Cervicogenic headache     Problem List Patient Active Problem List   Diagnosis Date Noted  . Vaso vagal episode 12/12/2019  . Closed posterior dislocation of right elbow 12/12/2019  . Essential hypertension 12/12/2019  . History of kidney disease 06/11/2019  . Prediabetes   . Status post foot surgery 06/27/2013  . Other hammer toe (acquired) 06/10/2013  . Pain in foot 06/10/2013  . Onychomycosis 06/10/2013   MScot Jun PT, DPT, OCS, ATC 11/10/20  9:58 AM    [facility] [address] [address]Phone: [phone]  Fax:  [phone] Name: Melissa ANASTOSMRN: [id number] of Birth: [birth year]

## 2020-11-10 NOTE — Patient Instructions (Signed)
Access Code: [email or web address] URL: [email or web address] Date: 11/10/2020 Prepared by: Scot Jun  Exercises Seated Scapular Retraction - 2 x daily - 7 x weekly - 2 sets - 10 reps - 5 hold Supine Chin Tuck - 2 x daily - 7 x weekly - 1 sets - 10-15 reps - 5 hold Supine Shoulder Horizontal Abduction with Resistance - 1 x daily - 7 x weekly - 3 sets - 10 reps Supine Cervical Rotation AROM on Pillow - 2 x daily - 7 x weekly - 2 sets - 10 reps Standing Shoulder Row with Anchored Resistance - 2 x daily - 7 x weekly - 2 sets - 10 reps Shoulder Extension with Resistance - 2 x daily - 7 x weekly - 2 sets - 10 reps Seated Thoracic Lumbar Extension - 2 x daily - 7 x weekly - 1 sets - 15 reps

## 2020-11-18 ENCOUNTER — Other Ambulatory Visit: Payer: Self-pay

## 2020-11-18 ENCOUNTER — Encounter: Payer: Self-pay | Admitting: Obstetrics and Gynecology

## 2020-11-18 ENCOUNTER — Ambulatory Visit: Payer: Medicare Other | Admitting: Obstetrics and Gynecology

## 2020-11-18 VITALS — BP 138/78 | HR 76 | Ht 66.0 in | Wt 192.0 lb

## 2020-11-18 DIAGNOSIS — L309 Dermatitis, unspecified: Secondary | ICD-10-CM | POA: Diagnosis not present

## 2020-11-18 DIAGNOSIS — L900 Lichen sclerosus et atrophicus: Secondary | ICD-10-CM

## 2020-11-18 NOTE — Progress Notes (Signed)
GYNECOLOGY  VISIT   HPI: 75 y.o.   Widowed White or Caucasian Not Hispanic or Latino  female   [phone] with Patient's last menstrual period was 08/14/1985 (approximate).   here for 6 month follow up Lichen sclerosus. She states that she has not had any problems with her Lichen Sclerosus.  No itching. Using the steroid ointment ~2 x a week. Uses Vaseline in between.  Perianal dermatitis is better, occasionally burning or itching. Her BM's are every other day, normal for her. If she doesn't get a BM every other day she uses miralax which helps.  She has gained 10 lbs.   She is currently staying with and taking care of her aunt who has terminal cancer.   GYNECOLOGIC HISTORY: Patient's last menstrual period was 08/14/1985 (approximate). Contraception: PMP Menopausal hormone therapy: none         OB History    Gravida  6   Para  1   Term  1   Preterm  0   AB  5   Living  1     SAB  5   IAB  0   Ectopic  0   Multiple  0   Live Births  1              Patient Active Problem List   Diagnosis Date Noted  . Vaso vagal episode 12/12/2019  . Closed posterior dislocation of right elbow 12/12/2019  . Essential hypertension 12/12/2019  . History of kidney disease 06/11/2019  . Prediabetes   . Status post foot surgery 06/27/2013  . Other hammer toe (acquired) 06/10/2013  . Pain in foot 06/10/2013  . Onychomycosis 06/10/2013    Past Medical History:  Diagnosis Date  . COPD (chronic obstructive pulmonary disease) ([place])   . Glaucoma   . History of kidney disease   . History of kidney disease 06/11/2019  . History of recurrent UTIs   . Hyperlipidemia   . Hypertension   . Lichen sclerosus January 2017   vulva/perianal region  . Non-melanoma skin cancer 08/2015   right leg  . Osteoporosis   . Post-menopausal    . Prediabetes     Past Surgical History:  Procedure Laterality Date  . CLOSED REDUCTION ELBOW FRACTURE Right 12/12/2019   Procedure: CLOSED REDUCTION  RIGHT ELBOW;  Surgeon: Shona Needles, MD;  Location: [place];  Service: Orthopedics;  Laterality: Right;  . EYE SURGERY Right    Stent placement  . FOOT SURGERY Right 06/2013   right 2nd toe  . HEMATOMA EVACUATION Left 2007   left calf - excision of hemangioma with thrombus    Current Outpatient Medications  Medication Sig Dispense Refill  . albuterol (VENTOLIN HFA) 108 (90 Base) MCG/ACT inhaler Inhale 2 puffs into the lungs every 6 (six) hours as needed for wheezing or shortness of breath.    Marland Kitchen amLODipine (NORVASC) 5 MG tablet Take 5 mg by mouth daily.    Marland Kitchen aspirin 81 MG tablet Take 81 mg by mouth daily.    Marland Kitchen aspirin-acetaminophen-caffeine (EXCEDRIN MIGRAINE) 250-250-65 MG tablet Take 1 tablet by mouth as needed for headache.    Marland Kitchen atorvastatin (LIPITOR) 20 MG tablet Take 1 tablet by mouth at bedtime.  11  . betamethasone valerate ointment (VALISONE) 0.1 % Apply a pea sized amount 1 x a week. Can increase to BID for up to 1-2 weeks as needed 30 g 1  . budesonide-formoterol (SYMBICORT) 160-4.5 MCG/ACT inhaler Inhale 2 puffs into the lungs daily.     Marland Kitchen  calcium carbonate (OS-CAL) 600 MG TABS tablet Take 600 mg by mouth 2 (two) times daily with a meal.    . Cetirizine HCl 10 MG CAPS Take 1 capsule (10 mg total) by mouth daily for 10 days. 10 capsule 0  . Cholecalciferol (VITAMIN D-3) 1000 UNITS CAPS Take 1,000 Units by mouth daily.     . Coenzyme Q10 (COQ-10 PO) Take 1 tablet by mouth daily.    . diclofenac sodium (VOLTAREN) 1 % GEL Apply 2 g topically 4 (four) times daily.    Marland Kitchen gabapentin (NEURONTIN) 100 MG capsule Take 1 capsule (100 mg total) by mouth at bedtime. 7 capsule 0  . lidocaine (XYLOCAINE) 5 % ointment Apply 1 application topically 4 (four) times daily as needed. (Patient taking differently: Apply 1 application topically 4 (four) times daily as needed for mild pain.) 30 g 0  . losartan (COZAAR) 100 MG tablet Take 100 mg by mouth daily.     . Misc Natural Products (FOCUSED MIND PO)  Take 1 tablet by mouth daily.     . Multiple Vitamins-Minerals (ZINC PO) Take 1 tablet by mouth daily.     . polyethylene glycol (MIRALAX / GLYCOLAX) 17 g packet Take 17 g by mouth daily.     Marland Kitchen senna-docusate (SENOKOT-S) 8.6-50 MG tablet Take 2 tablets by mouth at bedtime as needed for mild constipation or moderate constipation. 15 tablet 0  . traMADol (ULTRAM) 50 MG tablet Take 1 tablet (50 mg total) by mouth at bedtime as needed. 30 tablet 0  . Ubrogepant (UBRELVY) 50 MG TABS Take 50 mg by mouth as needed (may repeat once in 2 hours. no more than 2 pills in 24 h.). 10 tablet 3  . vitamin C (ASCORBIC ACID) 250 MG tablet Take 250 mg by mouth daily.     No current facility-administered medications for this visit.     ALLERGIES: Contrast media [iodinated diagnostic agents], Ether, and Iohexol  Family History  Problem Relation Age of Onset  . Breast cancer Mother        deceased -33's  . Heart attack Father   . Heart attack Brother     Social History   Socioeconomic History  . Marital status: Widowed    Spouse name: Not on file  . Number of children: Not on file  . Years of education: Not on file  . Highest education level: Not on file  Occupational History  . Not on file  Tobacco Use  . Smoking status: Never Smoker  . Smokeless tobacco: Never Used  Vaping Use  . Vaping Use: Never used  Substance and Sexual Activity  . Alcohol use: Not Currently  . Drug use: No  . Sexual activity: Not Currently    Birth control/protection: Post-menopausal  Other Topics Concern  . Not on file  Social History Narrative  . Not on file   Social Determinants of Health   Financial Resource Strain: Not on file  Food Insecurity: Not on file  Transportation Needs: Not on file  Physical Activity: Not on file  Stress: Not on file  Social Connections: Not on file  Intimate Partner Violence: Not on file    Review of Systems  Gastrointestinal: Positive for constipation.  All other systems  reviewed and are negative.   PHYSICAL EXAMINATION:    BP 138/78   Pulse 76   Ht 5\' 6"  (1.676 m)   Wt 192 lb (87.1 kg)   LMP 08/14/1985 (Approximate)   SpO2 98%  BMI 30.99 kg/m     General appearance: alert, cooperative and appears stated age  Pelvic: External genitalia:  Diffuse whitening and thinning of the skin, some loss of architecture. No lesions              Urethra:  normal appearing urethra with no masses, tenderness or lesions              Bartholins and Skenes: normal                 Perianal skin: diffuse whitening, some skin break down  Chaperone was present for exam.  1. Lichen sclerosus Stable Continue with 2 x a week steroid ointment She is aware of vulvar skin care  2. Perianal dermatitis Continues to be an issue, currently tolerable to her Continue with skin care as discussed

## 2020-12-06 ENCOUNTER — Telehealth: Payer: Self-pay

## 2020-12-06 NOTE — Telephone Encounter (Signed)
VOB has been submitted for Synvisc, right knee. Pending BV. 

## 2020-12-31 ENCOUNTER — Telehealth: Payer: Self-pay

## 2020-12-31 NOTE — Telephone Encounter (Signed)
Approved for Synvisc series, right knee. [name] Patient will be responsible for 20% OOP Co-pay of $35.00 each visit No PA required  Appt.01/17/2021 with Dr. Marlou Sa

## 2021-01-17 ENCOUNTER — Ambulatory Visit: Payer: Medicare Other | Admitting: Orthopedic Surgery

## 2021-01-17 DIAGNOSIS — M1711 Unilateral primary osteoarthritis, right knee: Secondary | ICD-10-CM

## 2021-01-18 ENCOUNTER — Telehealth: Payer: Self-pay

## 2021-01-18 NOTE — Telephone Encounter (Signed)
Can either of you advise? Do you want her to come in to be seen Wednesday?

## 2021-01-18 NOTE — Telephone Encounter (Signed)
IC discussed with patient. She had not tried ice/tylenol/ibuprofen. She would like to try these things first and if she is not any better by tomorrow, she will call and we can work her in for Thursday morning

## 2021-01-18 NOTE — Telephone Encounter (Signed)
Patient called stating that she is having right knee pain that is radiating up her right leg and into her right hip.  Patient received Synvisc injection #1 on Monday, 01/17/2021.  Would like a call back and stated that she has been using ice.  CB# [phone].  Please advise.  Thank you.

## 2021-01-18 NOTE — Telephone Encounter (Signed)
Thursday ok

## 2021-01-20 ENCOUNTER — Encounter: Payer: Self-pay | Admitting: Orthopedic Surgery

## 2021-01-20 DIAGNOSIS — M1711 Unilateral primary osteoarthritis, right knee: Secondary | ICD-10-CM | POA: Diagnosis not present

## 2021-01-20 MED ORDER — LIDOCAINE HCL 1 % IJ SOLN
5.0000 mL | INTRAMUSCULAR | Status: AC | PRN
Start: 2021-01-20 — End: 2021-01-20
  Administered 2021-01-20: 5 mL

## 2021-01-20 MED ORDER — HYLAN G-F 20 16 MG/2ML IX SOSY
16.0000 mg | PREFILLED_SYRINGE | INTRA_ARTICULAR | Status: AC | PRN
  Administered 2021-01-20: 16 mg via INTRA_ARTICULAR

## 2021-01-20 NOTE — Progress Notes (Signed)
   Procedure Note  Patient: [name]             Date of Birth: 1946/07/20           MRN: [id number]             Visit Date: 01/17/2021  Procedures: Visit Diagnoses:  1. Unilateral primary osteoarthritis, right knee     Large Joint Inj: R knee on 01/20/2021 7:53 AM Indications: diagnostic evaluation, joint swelling and pain Details: 18 G 1.5 in needle, superolateral approach  Arthrogram: No  Medications: 5 mL lidocaine 1 %; 16 mg Hylan 16 MG/2ML Outcome: tolerated well, no immediate complications Procedure, treatment alternatives, risks and benefits explained, specific risks discussed. Consent was given by the patient. Immediately prior to procedure a time out was called to verify the correct patient, procedure, equipment, support staff and site/side marked as required. Patient was prepped and draped in the usual sterile fashion.

## 2021-01-24 ENCOUNTER — Ambulatory Visit: Payer: Medicare Other | Admitting: Orthopedic Surgery

## 2021-01-24 DIAGNOSIS — M1711 Unilateral primary osteoarthritis, right knee: Secondary | ICD-10-CM

## 2021-01-27 ENCOUNTER — Encounter: Payer: Self-pay | Admitting: Orthopedic Surgery

## 2021-01-27 DIAGNOSIS — M1711 Unilateral primary osteoarthritis, right knee: Secondary | ICD-10-CM

## 2021-01-27 MED ORDER — HYLAN G-F 20 16 MG/2ML IX SOSY
16.0000 mg | PREFILLED_SYRINGE | INTRA_ARTICULAR | Status: AC | PRN
  Administered 2021-01-27: 16 mg via INTRA_ARTICULAR

## 2021-01-27 MED ORDER — LIDOCAINE HCL 1 % IJ SOLN
5.0000 mL | INTRAMUSCULAR | Status: AC | PRN
  Administered 2021-01-27: 5 mL

## 2021-01-27 NOTE — Progress Notes (Signed)
   Procedure Note  Patient: [name]             Date of Birth: 1945/10/15           MRN: [id number]             Visit Date: 01/24/2021  Procedures: Visit Diagnoses:  1. Unilateral primary osteoarthritis, right knee     Large Joint Inj: R knee on 01/27/2021 4:52 PM Indications: diagnostic evaluation, joint swelling and pain Details: 18 G 1.5 in needle, superolateral approach  Arthrogram: No  Medications: 5 mL lidocaine 1 %; 16 mg Hylan 16 MG/2ML Outcome: tolerated well, no immediate complications Procedure, treatment alternatives, risks and benefits explained, specific risks discussed. Consent was given by the patient. Immediately prior to procedure a time out was called to verify the correct patient, procedure, equipment, support staff and site/side marked as required. Patient was prepped and draped in the usual sterile fashion.

## 2021-01-31 ENCOUNTER — Ambulatory Visit: Payer: Medicare Other | Admitting: Orthopedic Surgery

## 2021-01-31 ENCOUNTER — Encounter: Payer: Self-pay | Admitting: Orthopedic Surgery

## 2021-01-31 DIAGNOSIS — M1711 Unilateral primary osteoarthritis, right knee: Secondary | ICD-10-CM

## 2021-01-31 NOTE — Progress Notes (Signed)
   Procedure Note  Patient: [name]             Date of Birth: [birth year]           MRN: [id number]             Visit Date: 01/31/2021  Procedures: Visit Diagnoses:  1. Unilateral primary osteoarthritis, right knee     Large Joint Inj: R knee on 01/31/2021 1:41 PM Indications: diagnostic evaluation, joint swelling and pain Details: 18 G 1.5 in needle, superolateral approach  Arthrogram: No  Medications: 5 mL lidocaine 1 %; 16 mg Hylan 16 MG/2ML Outcome: tolerated well, no immediate complications Procedure, treatment alternatives, risks and benefits explained, specific risks discussed. Consent was given by the patient. Immediately prior to procedure a time out was called to verify the correct patient, procedure, equipment, support staff and site/side marked as required. Patient was prepped and draped in the usual sterile fashion.

## 2021-02-06 MED ORDER — HYLAN G-F 20 16 MG/2ML IX SOSY
16.0000 mg | PREFILLED_SYRINGE | INTRA_ARTICULAR | Status: AC | PRN
  Administered 2021-01-31: 16 mg via INTRA_ARTICULAR

## 2021-02-06 MED ORDER — LIDOCAINE HCL 1 % IJ SOLN
5.0000 mL | INTRAMUSCULAR | Status: AC | PRN
Start: 2021-01-31 — End: 2021-01-31
  Administered 2021-01-31: 5 mL

## 2021-03-11 ENCOUNTER — Telehealth: Payer: Self-pay | Admitting: *Deleted

## 2021-03-11 NOTE — Telephone Encounter (Signed)
Patient has annual exam scheduled on 05/25/21, called requesting refill on betamethasone valerate ointment for Lichen sclerosus. Patient is out of state in [place] with a ill friend, okay to refill ointment?

## 2021-03-11 NOTE — Telephone Encounter (Signed)
When I called patient to relay the below, she said she found a tube of cream and doesn't need a refill.

## 2021-03-17 ENCOUNTER — Telehealth: Payer: Self-pay

## 2021-03-17 NOTE — Telephone Encounter (Signed)
Per Eustace Pen, Dr. Talbert Nan recommended working patient in on Monday and in informed patient.. I explained to patient that Dr. Talbert Nan is off tomorrow but to continue to use the betamethasone ointment twice daily and hopefully some improvement to follow in the next few days. She was transferred to appt desk to schedule work-in appt for Monday.

## 2021-03-17 NOTE — Telephone Encounter (Signed)
Message in voice mail after lunch from patient stating she is in a lot of pain and just returned home from [place] in hopes of having a visit this afternoon. I called her back and left message in voice mail that I needed to speak with her for more info and to please call me.

## 2021-03-17 NOTE — Telephone Encounter (Signed)
Patient called back. She said she is in "terrible pain" with vulvar swelling and pain and a little bit into vagina bright red. She said she drove back home from [place] this morning because she needs help with this. She started using the betamethasone cream bid when she started with this pain several days ago.  She said it has not helped.  She did say she had change in laundry detergent. She is not sure what caused this flare.  History of Lichen Sclerosis.

## 2021-03-21 ENCOUNTER — Ambulatory Visit: Payer: Medicare Other | Admitting: Obstetrics and Gynecology

## 2021-03-24 ENCOUNTER — Other Ambulatory Visit: Payer: Self-pay

## 2021-03-24 ENCOUNTER — Telehealth: Payer: Self-pay | Admitting: *Deleted

## 2021-03-24 ENCOUNTER — Other Ambulatory Visit: Payer: Self-pay | Admitting: Obstetrics and Gynecology

## 2021-03-24 ENCOUNTER — Encounter: Payer: Self-pay | Admitting: Obstetrics and Gynecology

## 2021-03-24 ENCOUNTER — Ambulatory Visit: Payer: Medicare Other | Admitting: Obstetrics and Gynecology

## 2021-03-24 VITALS — BP 160/80 | HR 73 | Ht 66.0 in | Wt 181.0 lb

## 2021-03-24 DIAGNOSIS — N3941 Urge incontinence: Secondary | ICD-10-CM | POA: Diagnosis not present

## 2021-03-24 DIAGNOSIS — N763 Subacute and chronic vulvitis: Secondary | ICD-10-CM | POA: Diagnosis not present

## 2021-03-24 DIAGNOSIS — L309 Dermatitis, unspecified: Secondary | ICD-10-CM | POA: Diagnosis not present

## 2021-03-24 DIAGNOSIS — L900 Lichen sclerosus et atrophicus: Secondary | ICD-10-CM

## 2021-03-24 LAB — WET PREP FOR TRICH, YEAST, CLUE

## 2021-03-24 MED ORDER — CLOBETASOL PROPIONATE 0.05 % EX OINT: 1.0000 "application " | TOPICAL_OINTMENT | Freq: Two times a day (BID) | CUTANEOUS | 0 refills | Status: DC

## 2021-03-24 NOTE — Telephone Encounter (Signed)
Office notes faxed to [facility] they will call to schedule. 

## 2021-03-24 NOTE — Progress Notes (Signed)
GYNECOLOGY  VISIT   HPI: 76 y.o.   Widowed White or Caucasian Not Hispanic or Latino  female   [phone] with Patient's last menstrual period was 08/14/1985 (approximate).   here for Vulvar pain that has ben present for about 4 weeks.    Symptoms are on the left labia majora and right labia minora. She initially was itchy, now just hurting. She has been using valisone ointment bid for the last month, ran out a few days ago. She has been using A&D ointment and Vaseline, not helping.   She has urge incontinence, everyday. Wears incontinence pad. She puts Vaseline on her skin and on the pad. Prior incontinence medication wasn't working.   She was in [place] taking care of the boyfriend (together for 19 years) after a MI.    GYNECOLOGIC HISTORY: Patient's last menstrual period was 08/14/1985 (approximate). Contraception:na Menopausal hormone therapy: none         OB History     Gravida  6   Para  1   Term  1   Preterm  0   AB  5   Living  1      SAB  5   IAB  0   Ectopic  0   Multiple  0   Live Births  1              Patient Active Problem List   Diagnosis Date Noted   Vaso vagal episode 12/12/2019   Closed posterior dislocation of right elbow 12/12/2019   Essential hypertension 12/12/2019   History of kidney disease 06/11/2019   Prediabetes    Status post foot surgery 06/27/2013   Other hammer toe (acquired) 06/10/2013   Pain in foot 06/10/2013   Onychomycosis 06/10/2013    Past Medical History:  Diagnosis Date   COPD (chronic obstructive pulmonary disease) (HCC)    Glaucoma    History of kidney disease    History of kidney disease 06/11/2019   History of recurrent UTIs    Hyperlipidemia    Hypertension    Lichen sclerosus January 2017   vulva/perianal region   Non-melanoma skin cancer 08/2015   right leg   Osteoporosis    Post-menopausal     Prediabetes     Past Surgical History:  Procedure Laterality Date   CLOSED REDUCTION ELBOW  FRACTURE Right 12/12/2019   Procedure: CLOSED REDUCTION RIGHT ELBOW;  Surgeon: Shona Needles, MD;  Location: [facility];  Service: Orthopedics;  Laterality: Right;   EYE SURGERY Right    Stent placement   FOOT SURGERY Right 06/2013   right 2nd toe   HEMATOMA EVACUATION Left 2007   left calf - excision of hemangioma with thrombus    Current Outpatient Medications  Medication Sig Dispense Refill   albuterol (VENTOLIN HFA) 108 (90 Base) MCG/ACT inhaler Inhale 2 puffs into the lungs every 6 (six) hours as needed for wheezing or shortness of breath.     amLODipine (NORVASC) 5 MG tablet Take 5 mg by mouth daily.     aspirin 81 MG tablet Take 81 mg by mouth daily.     aspirin-acetaminophen-caffeine (EXCEDRIN MIGRAINE) 250-250-65 MG tablet Take 1 tablet by mouth as needed for headache.     atorvastatin (LIPITOR) 20 MG tablet Take 1 tablet by mouth at bedtime.  11   betamethasone valerate ointment (VALISONE) 0.1 % Apply a pea sized amount 1 x a week. Can increase to BID for up to 1-2 weeks as needed 30 g 1  budesonide-formoterol (SYMBICORT) 160-4.5 MCG/ACT inhaler Inhale 2 puffs into the lungs daily.      calcium carbonate (OS-CAL) 600 MG TABS tablet Take 600 mg by mouth 2 (two) times daily with a meal.     Cetirizine HCl 10 MG CAPS Take 1 capsule (10 mg total) by mouth daily for 10 days. 10 capsule 0   Cholecalciferol (VITAMIN D-3) 1000 UNITS CAPS Take 1,000 Units by mouth daily.      Coenzyme Q10 (COQ-10 PO) Take 1 tablet by mouth daily.     diclofenac sodium (VOLTAREN) 1 % GEL Apply 2 g topically 4 (four) times daily.     gabapentin (NEURONTIN) 100 MG capsule Take 1 capsule (100 mg total) by mouth at bedtime. 7 capsule 0   losartan (COZAAR) 100 MG tablet Take 100 mg by mouth daily.      Misc Natural Products (FOCUSED MIND PO) Take 1 tablet by mouth daily.      Multiple Vitamins-Minerals (ZINC PO) Take 1 tablet by mouth daily.      polyethylene glycol (MIRALAX / GLYCOLAX) 17 g packet Take 17 g by  mouth daily.      senna-docusate (SENOKOT-S) 8.6-50 MG tablet Take 2 tablets by mouth at bedtime as needed for mild constipation or moderate constipation. 15 tablet 0   traMADol (ULTRAM) 50 MG tablet Take 1 tablet (50 mg total) by mouth at bedtime as needed. 30 tablet 0   Ubrogepant (UBRELVY) 50 MG TABS Take 50 mg by mouth as needed (may repeat once in 2 hours. no more than 2 pills in 24 h.). 10 tablet 3   vitamin C (ASCORBIC ACID) 250 MG tablet Take 250 mg by mouth daily.     No current facility-administered medications for this visit.     ALLERGIES: Contrast media [iodinated diagnostic agents], Ether, and Iohexol  Family History  Problem Relation Age of Onset   Breast cancer Mother        deceased -20's   Heart attack Father    Heart attack Brother     Social History   Socioeconomic History   Marital status: Widowed    Spouse name: Not on file   Number of children: Not on file   Years of education: Not on file   Highest education level: Not on file  Occupational History   Not on file  Tobacco Use   Smoking status: Never   Smokeless tobacco: Never  Vaping Use   Vaping Use: Never used  Substance and Sexual Activity   Alcohol use: Not Currently   Drug use: No   Sexual activity: Not Currently    Birth control/protection: Post-menopausal  Other Topics Concern   Not on file  Social History Narrative   Not on file   Social Determinants of Health   Financial Resource Strain: Not on file  Food Insecurity: Not on file  Transportation Needs: Not on file  Physical Activity: Not on file  Stress: Not on file  Social Connections: Not on file  Intimate Partner Violence: Not on file    Review of Systems  All other systems reviewed and are negative.  PHYSICAL EXAMINATION:    BP (!) 160/80   Pulse 73   Ht '5\' 6"'$  (1.676 m)   Wt 181 lb (82.1 kg)   LMP 08/14/1985 (Approximate)   SpO2 97%   BMI 29.21 kg/m     General appearance: alert, cooperative and appears stated  age   Pelvic: External genitalia:  diffuse whitening and thinning of  the skin, some loss of architecture, focal erythema at the clitoral hood, focal erythema on the lower left and right labia majora.               Urethra:  normal appearing urethra with no masses, tenderness or lesions              Bartholins and Skenes: normal                 Vagina: atrophic appearing vagina with normal color and discharge, no lesions              Perianal skin: thick, white, deep ulcerated fissure posterior to the anus.  Chaperone was present for exam.  1. Chronic vulvitis She was using valisone ointment, helped the itch, still sore focally - WET PREP FOR TRICH, YEAST, CLUE: negative  - clobetasol ointment (TEMOVATE) 0.05 %; Apply 1 application topically 2 (two) times daily. Apply as directed twice daily  Dispense: 60 g; Refill: 0 - Bacterial Vaginosis DNA - Candidiasis, PCR -Discussed vulvar/perianal skin care, information given  2. Lichen sclerosus Stable  3. Perianal dermatitis Severe. She wasn't using steroid ointment in this region - clobetasol ointment (TEMOVATE) 0.05 %; Apply 1 application topically 2 (two) times daily. Apply as directed twice daily  Dispense: 60 g; Refill: 0  4. Urge incontinence Refer to Urology to discuss treatment for urge incontinence other than medication.   Over 30 minutes in total patient care.

## 2021-03-24 NOTE — Telephone Encounter (Signed)
-----   Message from Salvadore Dom, MD sent at 03/24/2021  9:59 AM EDT ----- Please place a referral to [facility] for urge incontinence (not helped with medication). Thanks, Sharee Pimple

## 2021-03-26 ENCOUNTER — Emergency Department (HOSPITAL_BASED_OUTPATIENT_CLINIC_OR_DEPARTMENT_OTHER)
Admission: EM | Admit: 2021-03-26 | Discharge: 2021-03-26 | Disposition: A | Discharge status: Home / Self Care | Payer: Medicare Other | Attending: Emergency Medicine | Admitting: Emergency Medicine

## 2021-03-26 ENCOUNTER — Other Ambulatory Visit: Payer: Self-pay

## 2021-03-26 ENCOUNTER — Encounter (HOSPITAL_BASED_OUTPATIENT_CLINIC_OR_DEPARTMENT_OTHER): Payer: Self-pay

## 2021-03-26 DIAGNOSIS — I10 Essential (primary) hypertension: Secondary | ICD-10-CM | POA: Diagnosis not present

## 2021-03-26 DIAGNOSIS — Z7982 Long term (current) use of aspirin: Secondary | ICD-10-CM | POA: Insufficient documentation

## 2021-03-26 DIAGNOSIS — W57XXXA Bitten or stung by nonvenomous insect and other nonvenomous arthropods, initial encounter: Secondary | ICD-10-CM | POA: Diagnosis not present

## 2021-03-26 DIAGNOSIS — T63461A Toxic effect of venom of wasps, accidental (unintentional), initial encounter: Secondary | ICD-10-CM

## 2021-03-26 DIAGNOSIS — S60561A Insect bite (nonvenomous) of right hand, initial encounter: Secondary | ICD-10-CM | POA: Diagnosis present

## 2021-03-26 DIAGNOSIS — Z85828 Personal history of other malignant neoplasm of skin: Secondary | ICD-10-CM | POA: Insufficient documentation

## 2021-03-26 DIAGNOSIS — S60562A Insect bite (nonvenomous) of left hand, initial encounter: Secondary | ICD-10-CM | POA: Diagnosis not present

## 2021-03-26 DIAGNOSIS — Z7951 Long term (current) use of inhaled steroids: Secondary | ICD-10-CM | POA: Diagnosis not present

## 2021-03-26 DIAGNOSIS — J449 Chronic obstructive pulmonary disease, unspecified: Secondary | ICD-10-CM | POA: Insufficient documentation

## 2021-03-26 DIAGNOSIS — Z79899 Other long term (current) drug therapy: Secondary | ICD-10-CM | POA: Diagnosis not present

## 2021-03-26 MED ORDER — ACETAMINOPHEN 325 MG PO TABS
650.0000 mg | ORAL_TABLET | Freq: Once | ORAL | Status: AC
  Administered 2021-03-26: 650 mg via ORAL
  Filled 2021-03-26: qty 2

## 2021-03-26 NOTE — ED Triage Notes (Signed)
Pt states that she was stung by either a wasp or bee on both hands approximately four hours ago. Pt does not believe that she is allergic to bees. Seeking evaluation for swelling and pain to hands. Pt denies N/V, hives, swollen airway, SOB. Pt reports taking 50 mg benadryl PTA

## 2021-03-26 NOTE — ED Provider Notes (Signed)
[facility] EMERGENCY DEPT Provider Note   CSN: [id number] Arrival date & time: 03/26/21  1653     History Chief Complaint  Patient presents with   Insect Bite    [name] is a 75 y.o. female.  Patient presents with chief complaint of wasp stings to bilateral hands that occurred about 4 hours prior to arrival.  Patient states that she took 50 mg of Benadryl as well as Zyrtec 10 mg tablet.  She has persistent swelling and pain just limited to the hands.  Denies chest pain or shortness of breath denies palpitations or diaphoresis.  No reports of fevers or cough or vomiting or diarrhea.  She states the swelling appears somewhat improved since her self treatment.      Past Medical History:  Diagnosis Date   COPD (chronic obstructive pulmonary disease) ([facility])    Glaucoma    History of kidney disease    History of kidney disease 06/11/2019   History of recurrent UTIs    Hyperlipidemia    Hypertension    Lichen sclerosus January 2017   vulva/perianal region   Non-melanoma skin cancer 08/2015   right leg   Osteoporosis    Post-menopausal     Prediabetes     Patient Active Problem List   Diagnosis Date Noted   Vaso vagal episode 12/12/2019   Closed posterior dislocation of right elbow 12/12/2019   Essential hypertension 12/12/2019   History of kidney disease 06/11/2019   Prediabetes    Status post foot surgery 06/27/2013   Other hammer toe (acquired) 06/10/2013   Pain in foot 06/10/2013   Onychomycosis 06/10/2013    Past Surgical History:  Procedure Laterality Date   CLOSED REDUCTION ELBOW FRACTURE Right 12/12/2019   Procedure: CLOSED REDUCTION RIGHT ELBOW;  Surgeon: Shona Needles, MD;  Location: [facility];  Service: Orthopedics;  Laterality: Right;   EYE SURGERY Right    Stent placement   FOOT SURGERY Right 06/2013   right 2nd toe   HEMATOMA EVACUATION Left 2007   left calf - excision of hemangioma with thrombus     OB History     Gravida   6   Para  1   Term  1   Preterm  0   AB  5   Living  1      SAB  5   IAB  0   Ectopic  0   Multiple  0   Live Births  1           Family History  Problem Relation Age of Onset   Breast cancer Mother        deceased -64's   Heart attack Father    Heart attack Brother     Social History   Tobacco Use   Smoking status: Never   Smokeless tobacco: Never  Vaping Use   Vaping Use: Never used  Substance Use Topics   Alcohol use: Not Currently   Drug use: No    Home Medications Prior to Admission medications   Medication Sig Start Date End Date Taking? Authorizing Provider  albuterol (VENTOLIN HFA) 108 (90 Base) MCG/ACT inhaler Inhale 2 puffs into the lungs every 6 (six) hours as needed for wheezing or shortness of breath.    [provider]  amLODipine (NORVASC) 5 MG tablet Take 5 mg by mouth daily. 06/23/20   [provider]  aspirin 81 MG tablet Take 81 mg by mouth daily.    [provider]  aspirin-acetaminophen-caffeine (EXCEDRIN MIGRAINE) 250-250-65 MG tablet Take 1 tablet by mouth as needed for headache.    [provider]  atorvastatin (LIPITOR) 20 MG tablet Take 1 tablet by mouth at bedtime. 06/26/15   [provider]  budesonide-formoterol (SYMBICORT) 160-4.5 MCG/ACT inhaler Inhale 2 puffs into the lungs daily.     [provider]  calcium carbonate (OS-CAL) 600 MG TABS tablet Take 600 mg by mouth 2 (two) times daily with a meal.    [provider]  Cetirizine HCl 10 MG CAPS Take 1 capsule (10 mg total) by mouth daily for 10 days. 01/25/19 12/10/28  Wieters, Hallie C, PA-C  Cholecalciferol (VITAMIN D-3) 1000 UNITS CAPS Take 1,000 Units by mouth daily.     [provider]  clobetasol ointment (TEMOVATE) AB-123456789 % Apply 1 application topically 2 (two) times daily. Apply as directed twice daily 03/24/21   Salvadore Dom, MD  Coenzyme Q10 (COQ-10 PO) Take 1 tablet by mouth daily.     [provider]  diclofenac sodium (VOLTAREN) 1 % GEL Apply 2 g topically 4 (four) times daily.    [provider]  gabapentin (NEURONTIN) 100 MG capsule Take 1 capsule (100 mg total) by mouth at bedtime. 12/04/19   Star Age, MD  losartan (COZAAR) 100 MG tablet Take 100 mg by mouth daily.  10/03/17   [provider]  Misc Natural Products (FOCUSED MIND PO) Take 1 tablet by mouth daily.     [provider]  Multiple Vitamins-Minerals (ZINC PO) Take 1 tablet by mouth daily.     [provider]  polyethylene glycol (MIRALAX / GLYCOLAX) 17 g packet Take 17 g by mouth daily.     [provider]  senna-docusate (SENOKOT-S) 8.6-50 MG tablet Take 2 tablets by mouth at bedtime as needed for mild constipation or moderate constipation. 12/13/19   Amin, Jeanella Flattery, MD  traMADol (ULTRAM) 50 MG tablet Take 1 tablet (50 mg total) by mouth at bedtime as needed. 11/03/20   Magnant, Charles L, PA-C  Ubrogepant (UBRELVY) 50 MG TABS Take 50 mg by mouth as needed (may repeat once in 2 hours. no more than 2 pills in 24 h.). 11/26/19   Star Age, MD  vitamin C (ASCORBIC ACID) 250 MG tablet Take 250 mg by mouth daily.    [provider]  Vitamins A & D (VITAMIN A & D) ointment Apply 1 application topically as needed for dry skin.    [provider]    Allergies    Contrast media [iodinated diagnostic agents], Ether, and Iohexol  Review of Systems   Review of Systems  Constitutional:  Negative for fever.  HENT:  Negative for ear pain.   Eyes:  Negative for pain.  Respiratory:  Negative for cough.   Cardiovascular:  Negative for chest pain.  Gastrointestinal:  Negative for abdominal pain.  Genitourinary:  Negative for flank pain.  Musculoskeletal:  Negative for back pain.  Neurological:  Negative for headaches.   Physical Exam Updated Vital Signs BP (!) 169/84 (BP Location: Left Arm)   Pulse (!) 58   Temp 97.9 F (36.6 C) (Oral)    Resp 16   Ht '5\' 6"'$  (1.676 m)   Wt 82.1 kg   LMP 08/14/1985 (Approximate)   SpO2 97%   BMI 29.21 kg/m   Physical Exam Constitutional:      General: She is not in acute distress.    Appearance: Normal appearance.  HENT:  Head: Normocephalic.     Nose: Nose normal.  Eyes:     Extraocular Movements: Extraocular movements intact.  Cardiovascular:     Rate and Rhythm: Normal rate.  Pulmonary:     Effort: Pulmonary effort is normal.  Musculoskeletal:        General: Normal range of motion.     Cervical back: Normal range of motion.     Comments: Mild to moderate edema and swelling seen in the dorsum of the right and left hand where she was stung.  Tenderness is present in these regions.  However compartments are soft and she is neurovascularly intact otherwise.  No evidence of cellulitis, no remaining stinger noted.  No open wound or laceration noted.  Neurological:     General: No focal deficit present.     Mental Status: She is alert. Mental status is at baseline.    ED Results / Procedures / Treatments   Labs (all labs ordered are listed, but only abnormal results are displayed) Labs Reviewed - No data to display  EKG None  Radiology No results found.  Procedures Procedures   Medications Ordered in ED Medications  acetaminophen (TYLENOL) tablet 650 mg (has no administration in time range)    ED Course  I have reviewed the triage vital signs and the nursing notes.  Pertinent labs & imaging results that were available during my care of the patient were reviewed by me and considered in my medical decision making (see chart for details).    MDM Rules/Calculators/A&P                           No evidence of diffuse anaphylactic reaction noted.  Appears to be localized reaction to the wasp sting on her hands.  Patient already treated herself at home, recommending Zyrtec 10 mg daily at home as needed.  Recommending ice and elevation of the injured areas as  needed.  Advised her to call her primary care doctor for follow-up again in 3 to 4 days, advised immediate return for increased swelling difficulty breathing chest pain or any additional concerns.  Final Clinical Impression(s) / ED Diagnoses Final diagnoses:  Wasp sting, accidental or unintentional, initial encounter    Rx / DC Orders ED Discharge Orders     None        Luna Fuse, MD 03/26/21 [phone]

## 2021-03-26 NOTE — Discharge Instructions (Addendum)
Take Tylenol as needed for your pain 500 to 650 mg every 6 hours as needed for pain.  Apply ice and elevate the affected areas if the swelling continues.  Take Zyrtec 10 mg daily for the next 5 days.  Call your primary care doctor or specialist as discussed in the next 2-3 days.   Return immediately back to the ER if:  Your symptoms worsen within the next 12-24 hours. You develop new symptoms such as new fevers, persistent vomiting, new pain, shortness of breath, or new weakness or numbness, or if you have any other concerns.

## 2021-04-01 ENCOUNTER — Other Ambulatory Visit: Payer: Self-pay | Admitting: *Deleted

## 2021-04-01 LAB — SURESWAB BACTERIAL VAGINOSIS/ITIS
Atopobium vaginae: NOT DETECTED Log (cells/mL)
C. albicans, DNA: DETECTED — AB
C. glabrata, DNA: NOT DETECTED
C. parapsilosis, DNA: NOT DETECTED
C. tropicalis, DNA: NOT DETECTED
Gardnerella vaginalis: NOT DETECTED Log (cells/mL)
LACTOBACILLUS SPECIES: NOT DETECTED Log (cells/mL)
MEGASPHAERA SPECIES: NOT DETECTED Log (cells/mL)
Trichomonas vaginalis RNA: NOT DETECTED

## 2021-04-01 MED ORDER — FLUCONAZOLE 150 MG PO TABS: ORAL_TABLET | ORAL | 0 refills | Status: DC

## 2021-04-08 ENCOUNTER — Ambulatory Visit: Payer: Medicare Other | Admitting: Obstetrics and Gynecology

## 2021-04-08 ENCOUNTER — Other Ambulatory Visit: Payer: Self-pay

## 2021-04-08 ENCOUNTER — Encounter: Payer: Self-pay | Admitting: Obstetrics and Gynecology

## 2021-04-08 VITALS — BP 160/80 | HR 58 | Ht 66.0 in | Wt 183.0 lb

## 2021-04-08 DIAGNOSIS — N763 Subacute and chronic vulvitis: Secondary | ICD-10-CM | POA: Diagnosis not present

## 2021-04-08 DIAGNOSIS — L309 Dermatitis, unspecified: Secondary | ICD-10-CM

## 2021-04-08 DIAGNOSIS — L900 Lichen sclerosus et atrophicus: Secondary | ICD-10-CM

## 2021-04-08 NOTE — Progress Notes (Signed)
GYNECOLOGY  VISIT   HPI: 75 y.o.   Widowed White or Caucasian Not Hispanic or Latino  female   [phone] with Patient's last menstrual period was 08/14/1985 (approximate).   here for two week follow up for chronic vulvitis. Patient states that she is not hurting as bad. She states that the skin is red.   The patient has had intermittent issues with vulvitis. She was seen 2 weeks ago with vulvitis and perianal pruritus and was treated with steroid ointment. Wet prep was negative but vaginal culture returned with yeast and she was treated with diflucan one week ago.   The vulvar area feels better, she is still having perianal irritation and itching but it has improved.   GYNECOLOGIC HISTORY: Patient's last menstrual period was 08/14/1985 (approximate). Contraception:PMP  Menopausal hormone therapy: none        OB History     Gravida  6   Para  1   Term  1   Preterm  0   AB  5   Living  1      SAB  5   IAB  0   Ectopic  0   Multiple  0   Live Births  1              Patient Active Problem List   Diagnosis Date Noted   Vaso vagal episode 12/12/2019   Closed posterior dislocation of right elbow 12/12/2019   Essential hypertension 12/12/2019   History of kidney disease 06/11/2019   Prediabetes    Status post foot surgery 06/27/2013   Other hammer toe (acquired) 06/10/2013   Pain in foot 06/10/2013   Onychomycosis 06/10/2013    Past Medical History:  Diagnosis Date   COPD (chronic obstructive pulmonary disease) (HCC)    Glaucoma    History of kidney disease    History of kidney disease 06/11/2019   History of recurrent UTIs    Hyperlipidemia    Hypertension    Lichen sclerosus January 2017   vulva/perianal region   Non-melanoma skin cancer 08/2015   right leg   Osteoporosis    Post-menopausal     Prediabetes     Past Surgical History:  Procedure Laterality Date   CLOSED REDUCTION ELBOW FRACTURE Right 12/12/2019   Procedure: CLOSED REDUCTION RIGHT  ELBOW;  Surgeon: Shona Needles, MD;  Location: [facility];  Service: Orthopedics;  Laterality: Right;   EYE SURGERY Right    Stent placement   FOOT SURGERY Right 06/2013   right 2nd toe   HEMATOMA EVACUATION Left 2007   left calf - excision of hemangioma with thrombus    Current Outpatient Medications  Medication Sig Dispense Refill   albuterol (VENTOLIN HFA) 108 (90 Base) MCG/ACT inhaler Inhale 2 puffs into the lungs every 6 (six) hours as needed for wheezing or shortness of breath.     amLODipine (NORVASC) 5 MG tablet Take 5 mg by mouth daily.     aspirin 81 MG tablet Take 81 mg by mouth daily.     aspirin-acetaminophen-caffeine (EXCEDRIN MIGRAINE) 250-250-65 MG tablet Take 1 tablet by mouth as needed for headache.     atorvastatin (LIPITOR) 20 MG tablet Take 1 tablet by mouth at bedtime.  11   budesonide-formoterol (SYMBICORT) 160-4.5 MCG/ACT inhaler Inhale 2 puffs into the lungs daily.      calcium carbonate (OS-CAL) 600 MG TABS tablet Take 600 mg by mouth 2 (two) times daily with a meal.     Cetirizine HCl 10  MG CAPS Take 1 capsule (10 mg total) by mouth daily for 10 days. 10 capsule 0   Cholecalciferol (VITAMIN D-3) 1000 UNITS CAPS Take 1,000 Units by mouth daily.      clobetasol ointment (TEMOVATE) AB-123456789 % Apply 1 application topically 2 (two) times daily. Apply as directed twice daily 60 g 0   Coenzyme Q10 (COQ-10 PO) Take 1 tablet by mouth daily.     diclofenac sodium (VOLTAREN) 1 % GEL Apply 2 g topically 4 (four) times daily.     fluconazole (DIFLUCAN) 150 MG tablet Take one now and repeat in 72 hours if needed. 2 tablet 0   gabapentin (NEURONTIN) 100 MG capsule Take 1 capsule (100 mg total) by mouth at bedtime. 7 capsule 0   losartan (COZAAR) 100 MG tablet Take 100 mg by mouth daily.      Misc Natural Products (FOCUSED MIND PO) Take 1 tablet by mouth daily.      Multiple Vitamins-Minerals (ZINC PO) Take 1 tablet by mouth daily.      polyethylene glycol (MIRALAX / GLYCOLAX) 17 g  packet Take 17 g by mouth daily.      senna-docusate (SENOKOT-S) 8.6-50 MG tablet Take 2 tablets by mouth at bedtime as needed for mild constipation or moderate constipation. 15 tablet 0   traMADol (ULTRAM) 50 MG tablet Take 1 tablet (50 mg total) by mouth at bedtime as needed. 30 tablet 0   Ubrogepant (UBRELVY) 50 MG TABS Take 50 mg by mouth as needed (may repeat once in 2 hours. no more than 2 pills in 24 h.). 10 tablet 3   vitamin C (ASCORBIC ACID) 250 MG tablet Take 250 mg by mouth daily.     Vitamins A & D (VITAMIN A & D) ointment Apply 1 application topically as needed for dry skin.     No current facility-administered medications for this visit.     ALLERGIES: Contrast media [iodinated diagnostic agents], Ether, and Iohexol  Family History  Problem Relation Age of Onset   Breast cancer Mother        deceased -31's   Heart attack Father    Heart attack Brother     Social History   Socioeconomic History   Marital status: Widowed    Spouse name: Not on file   Number of children: Not on file   Years of education: Not on file   Highest education level: Not on file  Occupational History   Not on file  Tobacco Use   Smoking status: Never   Smokeless tobacco: Never  Vaping Use   Vaping Use: Never used  Substance and Sexual Activity   Alcohol use: Not Currently   Drug use: No   Sexual activity: Not Currently    Birth control/protection: Post-menopausal  Other Topics Concern   Not on file  Social History Narrative   Not on file   Social Determinants of Health   Financial Resource Strain: Not on file  Food Insecurity: Not on file  Transportation Needs: Not on file  Physical Activity: Not on file  Stress: Not on file  Social Connections: Not on file  Intimate Partner Violence: Not on file    Review of Systems  All other systems reviewed and are negative.  PHYSICAL EXAMINATION:    LMP 08/14/1985 (Approximate)     General appearance: alert, cooperative and  appears stated age  Pelvic: External genitalia:  no lesions, mild erythema on the outer labia majora, loss of architecture, diffuse mild whitening  Urethra:  normal appearing urethra with no masses, tenderness or lesions              Bartholins and Skenes: normal                 Perianal skin: still white and thick, but improving, ulceration is healing  Chaperone was present for exam.  1. Chronic vulvitis Improved  2. Lichen sclerosus stable  3. Perianal dermatitis Improved with steroids, still not resolved. Continue steroid one x a day for 2 weeks.  F/U in 2 weeks if not better. Use vaseline as needed.

## 2021-04-20 ENCOUNTER — Other Ambulatory Visit: Payer: Self-pay

## 2021-04-20 ENCOUNTER — Encounter: Payer: Self-pay | Admitting: Obstetrics and Gynecology

## 2021-04-20 ENCOUNTER — Ambulatory Visit: Payer: Medicare Other | Admitting: Obstetrics and Gynecology

## 2021-04-20 VITALS — BP 110/64 | HR 71 | Ht 66.0 in | Wt 184.0 lb

## 2021-04-20 DIAGNOSIS — K602 Anal fissure, unspecified: Secondary | ICD-10-CM | POA: Diagnosis not present

## 2021-04-20 DIAGNOSIS — L309 Dermatitis, unspecified: Secondary | ICD-10-CM | POA: Diagnosis not present

## 2021-04-20 DIAGNOSIS — L900 Lichen sclerosus et atrophicus: Secondary | ICD-10-CM | POA: Diagnosis not present

## 2021-04-20 DIAGNOSIS — N763 Subacute and chronic vulvitis: Secondary | ICD-10-CM | POA: Diagnosis not present

## 2021-04-20 MED ORDER — LIDOCAINE 5 % EX OINT: 1.0000 "application " | TOPICAL_OINTMENT | Freq: Four times a day (QID) | CUTANEOUS | 1 refills | Status: DC | PRN

## 2021-04-20 MED ORDER — CLOBETASOL PROPIONATE 0.05 % EX OINT: TOPICAL_OINTMENT | CUTANEOUS | 0 refills | Status: DC

## 2021-04-20 NOTE — Progress Notes (Signed)
GYNECOLOGY  VISIT   HPI: 75 y.o.   Widowed White or Caucasian Not Hispanic or Latino  female   [phone] with Patient's last menstrual period was 08/14/1985 (approximate).   here for follow up chronic vulvitis. Patient states that she does not feel like she is much better. No more itching, but still very sore. Feels like she tears when she has a BM. She has some issues with constipation. Takes miralax daily.   GYNECOLOGIC HISTORY: Patient's last menstrual period was 08/14/1985 (approximate). Contraception:PMP  Menopausal hormone therapy: none         OB History     Gravida  6   Para  1   Term  1   Preterm  0   AB  5   Living  1      SAB  5   IAB  0   Ectopic  0   Multiple  0   Live Births  1              Patient Active Problem List   Diagnosis Date Noted   Vaso vagal episode 12/12/2019   Closed posterior dislocation of right elbow 12/12/2019   Essential hypertension 12/12/2019   History of kidney disease 06/11/2019   Prediabetes    Status post foot surgery 06/27/2013   Other hammer toe (acquired) 06/10/2013   Pain in foot 06/10/2013   Onychomycosis 06/10/2013    Past Medical History:  Diagnosis Date   COPD (chronic obstructive pulmonary disease) (HCC)    Glaucoma    History of kidney disease    History of kidney disease 06/11/2019   History of recurrent UTIs    Hyperlipidemia    Hypertension    Lichen sclerosus January 2017   vulva/perianal region   Non-melanoma skin cancer 08/2015   right leg   Osteoporosis    Post-menopausal     Prediabetes     Past Surgical History:  Procedure Laterality Date   CLOSED REDUCTION ELBOW FRACTURE Right 12/12/2019   Procedure: CLOSED REDUCTION RIGHT ELBOW;  Surgeon: Shona Needles, MD;  Location: [facility];  Service: Orthopedics;  Laterality: Right;   EYE SURGERY Right    Stent placement   FOOT SURGERY Right 06/2013   right 2nd toe   HEMATOMA EVACUATION Left 2007   left calf - excision of hemangioma with  thrombus    Current Outpatient Medications  Medication Sig Dispense Refill   albuterol (VENTOLIN HFA) 108 (90 Base) MCG/ACT inhaler Inhale 2 puffs into the lungs every 6 (six) hours as needed for wheezing or shortness of breath.     amLODipine (NORVASC) 5 MG tablet Take 5 mg by mouth daily.     aspirin 81 MG tablet Take 81 mg by mouth daily.     aspirin-acetaminophen-caffeine (EXCEDRIN MIGRAINE) 250-250-65 MG tablet Take 1 tablet by mouth as needed for headache.     atorvastatin (LIPITOR) 20 MG tablet Take 1 tablet by mouth at bedtime.  11   budesonide-formoterol (SYMBICORT) 160-4.5 MCG/ACT inhaler Inhale 2 puffs into the lungs daily.      calcium carbonate (OS-CAL) 600 MG TABS tablet Take 600 mg by mouth 2 (two) times daily with a meal.     Cetirizine HCl 10 MG CAPS Take 1 capsule (10 mg total) by mouth daily for 10 days. 10 capsule 0   Cholecalciferol (VITAMIN D-3) 1000 UNITS CAPS Take 1,000 Units by mouth daily.      clobetasol ointment (TEMOVATE) AB-123456789 % Apply 1 application topically 2 (  two) times daily. Apply as directed twice daily 60 g 0   Coenzyme Q10 (COQ-10 PO) Take 1 tablet by mouth daily.     diclofenac sodium (VOLTAREN) 1 % GEL Apply 2 g topically 4 (four) times daily.     fluconazole (DIFLUCAN) 150 MG tablet Take one now and repeat in 72 hours if needed. 2 tablet 0   gabapentin (NEURONTIN) 100 MG capsule Take 1 capsule (100 mg total) by mouth at bedtime. 7 capsule 0   losartan (COZAAR) 100 MG tablet Take 100 mg by mouth daily.      Misc Natural Products (FOCUSED MIND PO) Take 1 tablet by mouth daily.      Multiple Vitamins-Minerals (ZINC PO) Take 1 tablet by mouth daily.      polyethylene glycol (MIRALAX / GLYCOLAX) 17 g packet Take 17 g by mouth daily.      senna-docusate (SENOKOT-S) 8.6-50 MG tablet Take 2 tablets by mouth at bedtime as needed for mild constipation or moderate constipation. 15 tablet 0   traMADol (ULTRAM) 50 MG tablet Take 1 tablet (50 mg total) by mouth at  bedtime as needed. 30 tablet 0   Ubrogepant (UBRELVY) 50 MG TABS Take 50 mg by mouth as needed (may repeat once in 2 hours. no more than 2 pills in 24 h.). 10 tablet 3   vitamin C (ASCORBIC ACID) 250 MG tablet Take 250 mg by mouth daily.     Vitamins A & D (VITAMIN A & D) ointment Apply 1 application topically as needed for dry skin.     No current facility-administered medications for this visit.     ALLERGIES: Contrast media [iodinated diagnostic agents], Ether, and Iohexol  Family History  Problem Relation Age of Onset   Breast cancer Mother        deceased -59's   Heart attack Father    Heart attack Brother     Social History   Socioeconomic History   Marital status: Widowed    Spouse name: Not on file   Number of children: Not on file   Years of education: Not on file   Highest education level: Not on file  Occupational History   Not on file  Tobacco Use   Smoking status: Never   Smokeless tobacco: Never  Vaping Use   Vaping Use: Never used  Substance and Sexual Activity   Alcohol use: Not Currently   Drug use: No   Sexual activity: Not Currently    Birth control/protection: Post-menopausal  Other Topics Concern   Not on file  Social History Narrative   Not on file   Social Determinants of Health   Financial Resource Strain: Not on file  Food Insecurity: Not on file  Transportation Needs: Not on file  Physical Activity: Not on file  Stress: Not on file  Social Connections: Not on file  Intimate Partner Violence: Not on file    Review of Systems  All other systems reviewed and are negative.  PHYSICAL EXAMINATION:    LMP 08/14/1985 (Approximate)     General appearance: alert, cooperative and appears stated age   Pelvic: External genitalia:  diffuse whitening and thinning of the skin, agglutination of labia 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              Urethra:  normal appearing urethra with no masses, tenderness or lesions               Bartholins and Skenes: normal  Anus:  several deep fissures noted. Perianal skin is markedly improved, now with mild whitening  Chaperone was present for exam.  1. Chronic vulvitis Improved, change steroid ointment to 2 x a week  2. Lichen sclerosus Stable  3. Perianal dermatitis Improved, change steroid ointment to 2 x a week Aware of skin care  4. Anal fissure Try senna Lidocaine ointment If not improving will need to f/u with GI

## 2021-04-20 NOTE — Patient Instructions (Signed)
Try senna to make your stools very thin and soft. Start with 1 a day, you can increase to 2 tablets 2 x a day as needed.   Change steroids to 2 x a week  Continue with vaseline as needed

## 2021-04-26 NOTE — Telephone Encounter (Signed)
Patient scheduled on 06/02/21 with Dr.McMacDiarmid

## 2021-04-30 ENCOUNTER — Ambulatory Visit (HOSPITAL_COMMUNITY)
Admission: EM | Admit: 2021-04-30 | Discharge: 2021-04-30 | Disposition: A | Discharge status: Home / Self Care | Payer: Medicare Other | Attending: Emergency Medicine | Admitting: Emergency Medicine

## 2021-04-30 ENCOUNTER — Encounter (HOSPITAL_COMMUNITY): Payer: Self-pay

## 2021-04-30 ENCOUNTER — Other Ambulatory Visit: Payer: Self-pay

## 2021-04-30 DIAGNOSIS — T63441A Toxic effect of venom of bees, accidental (unintentional), initial encounter: Secondary | ICD-10-CM

## 2021-04-30 DIAGNOSIS — T7840XA Allergy, unspecified, initial encounter: Secondary | ICD-10-CM

## 2021-04-30 MED ORDER — METHYLPREDNISOLONE SODIUM SUCC 125 MG IJ SOLR
INTRAMUSCULAR | Status: AC
  Filled 2021-04-30: qty 2

## 2021-04-30 MED ORDER — METHYLPREDNISOLONE SODIUM SUCC 125 MG IJ SOLR
60.0000 mg | Freq: Once | INTRAMUSCULAR | Status: AC
  Administered 2021-04-30: 60 mg via INTRAMUSCULAR

## 2021-04-30 MED ORDER — FAMOTIDINE 20 MG PO TABS: 20.0000 mg | ORAL_TABLET | Freq: Every day | ORAL | 0 refills | Status: DC

## 2021-04-30 MED ORDER — CETIRIZINE HCL 10 MG PO CAPS: 10.0000 mg | ORAL_CAPSULE | Freq: Every day | ORAL | 0 refills | Status: DC

## 2021-04-30 NOTE — ED Triage Notes (Signed)
Pt presents with c/o a bee sting. States she was bitten last night and states it has swelled and c/o redness spreading all around her right arm.

## 2021-04-30 NOTE — ED Provider Notes (Signed)
[facility]    CSN: [id number] Arrival date & time: 04/30/21  1131      History   Chief Complaint Chief Complaint  Patient presents with   Insect Bite    Bee     HPI [name] is a 75 y.o. female.   Patient here for evaluation of right arm redness and swelling following a bee sting that occurred yesterday.  Reports taking Benadryl last night.  Reports having similar symptoms with bee stings in the past most recently about a month ago.  Reports arm is itchy.  Denies any trauma, injury, or other precipitating event.  Denies any specific alleviating or aggravating factors.  Denies any fevers, chest pain, shortness of breath, N/V/D, numbness, tingling, weakness, abdominal pain, or headaches.     The history is provided by the patient.   Past Medical History:  Diagnosis Date   COPD (chronic obstructive pulmonary disease) ([place])    Glaucoma    History of kidney disease    History of kidney disease 06/11/2019   History of recurrent UTIs    Hyperlipidemia    Hypertension    Lichen sclerosus January 2017   vulva/perianal region   Non-melanoma skin cancer 08/2015   right leg   Osteoporosis    Post-menopausal     Prediabetes     Patient Active Problem List   Diagnosis Date Noted   Vaso vagal episode 12/12/2019   Closed posterior dislocation of right elbow 12/12/2019   Essential hypertension 12/12/2019   History of kidney disease 06/11/2019   Prediabetes    Status post foot surgery 06/27/2013   Other hammer toe (acquired) 06/10/2013   Pain in foot 06/10/2013   Onychomycosis 06/10/2013    Past Surgical History:  Procedure Laterality Date   CLOSED REDUCTION ELBOW FRACTURE Right 12/12/2019   Procedure: CLOSED REDUCTION RIGHT ELBOW;  Surgeon: Shona Needles, MD;  Location: [facility];  Service: Orthopedics;  Laterality: Right;   EYE SURGERY Right    Stent placement   FOOT SURGERY Right 06/2013   right 2nd toe   HEMATOMA EVACUATION Left 2007   left calf -  excision of hemangioma with thrombus    OB History     Gravida  6   Para  1   Term  1   Preterm  0   AB  5   Living  1      SAB  5   IAB  0   Ectopic  0   Multiple  0   Live Births  1            Home Medications    Prior to Admission medications   Medication Sig Start Date End Date Taking? Authorizing Provider  famotidine (PEPCID) 20 MG tablet Take 1 tablet (20 mg total) by mouth daily for 5 days. 04/30/21 05/05/21 Yes Pearson Forster, NP  albuterol (VENTOLIN HFA) 108 (90 Base) MCG/ACT inhaler Inhale 2 puffs into the lungs every 6 (six) hours as needed for wheezing or shortness of breath.    [provider]  amLODipine (NORVASC) 5 MG tablet Take 5 mg by mouth daily. 06/23/20   [provider]  aspirin 81 MG tablet Take 81 mg by mouth daily.    [provider]  aspirin-acetaminophen-caffeine (EXCEDRIN MIGRAINE) [phone] MG tablet Take 1 tablet by mouth as needed for headache.    [provider]  atorvastatin (LIPITOR) 20 MG tablet Take 1 tablet by mouth at bedtime. 06/26/15  [provider]  budesonide-formoterol (SYMBICORT) 160-4.5 MCG/ACT inhaler Inhale 2 puffs into the lungs daily.     [provider]  calcium carbonate (OS-CAL) 600 MG TABS tablet Take 600 mg by mouth 2 (two) times daily with a meal.    [provider]  Cetirizine HCl 10 MG CAPS Take 1 capsule (10 mg total) by mouth daily for 5 days. 04/30/21 05/05/21  Pearson Forster, NP  Cholecalciferol (VITAMIN D-3) 1000 UNITS CAPS Take 1,000 Units by mouth daily.     [provider]  clobetasol ointment (TEMOVATE) 0.05 % Apply a small amount 2 x a week 04/20/21   Salvadore Dom, MD  Coenzyme Q10 (COQ-10 PO) Take 1 tablet by mouth daily.    [provider]  diclofenac sodium (VOLTAREN) 1 % GEL Apply 2 g topically 4 (four) times daily.    [provider]  fluconazole (DIFLUCAN) 150 MG tablet Take one now and repeat in  72 hours if needed. 04/01/21   Salvadore Dom, MD  gabapentin (NEURONTIN) 100 MG capsule Take 1 capsule (100 mg total) by mouth at bedtime. 12/04/19   Star Age, MD  lidocaine (XYLOCAINE) 5 % ointment Apply 1 application topically 4 (four) times daily as needed. 04/20/21   Salvadore Dom, MD  losartan (COZAAR) 100 MG tablet Take 100 mg by mouth daily.  10/03/17   [provider]  Misc Natural Products (FOCUSED MIND PO) Take 1 tablet by mouth daily.     [provider]  Multiple Vitamins-Minerals (ZINC PO) Take 1 tablet by mouth daily.     [provider]  polyethylene glycol (MIRALAX / GLYCOLAX) 17 g packet Take 17 g by mouth daily.     [provider]  senna-docusate (SENOKOT-S) 8.6-50 MG tablet Take 2 tablets by mouth at bedtime as needed for mild constipation or moderate constipation. 12/13/19   Amin, Jeanella Flattery, MD  traMADol (ULTRAM) 50 MG tablet Take 1 tablet (50 mg total) by mouth at bedtime as needed. 11/03/20   Magnant, Charles L, PA-C  Ubrogepant (UBRELVY) 50 MG TABS Take 50 mg by mouth as needed (may repeat once in 2 hours. no more than 2 pills in 24 h.). 11/26/19   Star Age, MD  vitamin C (ASCORBIC ACID) 250 MG tablet Take 250 mg by mouth daily.    [provider]  Vitamins A & D (VITAMIN A & D) ointment Apply 1 application topically as needed for dry skin.    [provider]    Family History Family History  Problem Relation Age of Onset   Breast cancer Mother        deceased -7's   Heart attack Father    Heart attack Brother     Social History Social History   Tobacco Use   Smoking status: Never   Smokeless tobacco: Never  Vaping Use   Vaping Use: Never used  Substance Use Topics   Alcohol use: Not Currently   Drug use: No     Allergies   Contrast media [iodinated diagnostic agents], Ether, and Iohexol   Review of Systems Review of Systems  Musculoskeletal:  Positive for arthralgias and joint  swelling.  All other systems reviewed and are negative.   Physical Exam Triage Vital Signs ED Triage Vitals  Enc Vitals Group     BP 04/30/21 1246 139/82     Pulse Rate 04/30/21 1246 60     Resp 04/30/21 1246 16  Temp 04/30/21 1247 97.8 F (36.6 C)     Temp Source 04/30/21 1247 Oral     SpO2 04/30/21 1246 94 %     Weight --      Height --      Head Circumference --      Peak Flow --      Pain Score 04/30/21 1245 6     Pain Loc --      Pain Edu? --      Excl. in [place]? --    No data found.  Updated Vital Signs BP 139/82 (BP Location: Left Arm)   Pulse 60   Temp 97.8 F (36.6 C) (Oral)   Resp 16   LMP 08/14/1985 (Approximate)   SpO2 94%   Visual Acuity Right Eye Distance:   Left Eye Distance:   Bilateral Distance:    Right Eye Near:   Left Eye Near:    Bilateral Near:     Physical Exam Vitals and nursing note reviewed.  Constitutional:      General: She is not in acute distress.    Appearance: Normal appearance. She is not ill-appearing, toxic-appearing or diaphoretic.  HENT:     Head: Normocephalic and atraumatic.  Eyes:     Conjunctiva/sclera: Conjunctivae normal.  Cardiovascular:     Rate and Rhythm: Normal rate.     Pulses: Normal pulses.     Heart sounds: Normal heart sounds.  Pulmonary:     Effort: Pulmonary effort is normal.     Breath sounds: Normal breath sounds.  Abdominal:     General: Abdomen is flat.  Musculoskeletal:        General: Normal range of motion.     Right forearm: Swelling (redness and swelling surrounding sting) present. No tenderness or bony tenderness.     Cervical back: Normal range of motion.  Skin:    General: Skin is warm and dry.  Neurological:     General: No focal deficit present.     Mental Status: She is alert and oriented to person, place, and time.  Psychiatric:        Mood and Affect: Mood normal.     UC Treatments / Results  Labs (all labs ordered are listed, but only abnormal results are  displayed) Labs Reviewed - No data to display  EKG   Radiology No results found.  Procedures Procedures (including critical care time)  Medications Ordered in UC Medications  methylPREDNISolone sodium succinate (SOLU-MEDROL) 125 mg/2 mL injection 60 mg (has no administration in time range)    Initial Impression / Assessment and Plan / UC Course  I have reviewed the triage vital signs and the nursing notes.  Pertinent labs & imaging results that were available during my care of the patient were reviewed by me and considered in my medical decision making (see chart for details).    Assessment negative for red flags or concerns.  No signs of respiratory distress or anaphylaxis.  Likely allergic reaction to bee sting.  Solu-Medrol IM administered in office.  Will prescribe cetirizine and famotidine daily for the next 5 days.  May use cool compresses or ice to help with swelling.  May use Benadryl cream or other anti-itch cream to help with itching.  Follow-up with primary care for reevaluation of soon as possible.  Strict ED follow-up for any red flag symptoms. Final Clinical Impressions(s) / UC Diagnoses   Final diagnoses:  Allergic reaction, initial encounter  Bee sting, accidental or unintentional, initial encounter  Discharge Instructions      Take the cetirizine daily for the next 5 days. Take the famotidine daily for the next 5 days.  You can use ice or a cool compress to help with swelling.  You can use Benadryl cream or another anti-itch cream as needed for itching.  Follow-up with your primary care provider as soon as possible for reevaluation. Call 911 or go to the emergency room for any worsening swelling, pain, shortness of breath, or difficulty breathing.     ED Prescriptions     Medication Sig Dispense Auth. Provider   Cetirizine HCl 10 MG CAPS Take 1 capsule (10 mg total) by mouth daily for 5 days. 5 capsule Pearson Forster, NP   famotidine (PEPCID) 20  MG tablet Take 1 tablet (20 mg total) by mouth daily for 5 days. 5 tablet Pearson Forster, NP      PDMP not reviewed this encounter.   Pearson Forster, NP 04/30/21 1317

## 2021-04-30 NOTE — Discharge Instructions (Signed)
Take the cetirizine daily for the next 5 days. Take the famotidine daily for the next 5 days.  You can use ice or a cool compress to help with swelling.  You can use Benadryl cream or another anti-itch cream as needed for itching.  Follow-up with your primary care provider as soon as possible for reevaluation. Call 911 or go to the emergency room for any worsening swelling, pain, shortness of breath, or difficulty breathing.

## 2021-05-17 ENCOUNTER — Telehealth: Payer: Self-pay | Admitting: Orthopedic Surgery

## 2021-05-17 NOTE — Telephone Encounter (Signed)
Pt calling asking if she can get a cortisone shot since she is not eligable for the synvisc until 08/02/21. Still having pain in that right knee and cramping. The best call back number is [phone].

## 2021-05-18 NOTE — Telephone Encounter (Signed)
Appt scheduled

## 2021-05-18 NOTE — Telephone Encounter (Signed)
Okay for this

## 2021-05-24 ENCOUNTER — Other Ambulatory Visit: Payer: Self-pay | Admitting: Internal Medicine

## 2021-05-24 ENCOUNTER — Ambulatory Visit
Admission: RE | Admit: 2021-05-24 | Discharge: 2021-05-24 | Disposition: A | Discharge status: Home / Self Care | Payer: Medicare Other | Source: Ambulatory Visit | Attending: Internal Medicine | Admitting: Internal Medicine

## 2021-05-24 DIAGNOSIS — R52 Pain, unspecified: Secondary | ICD-10-CM

## 2021-05-25 ENCOUNTER — Encounter: Payer: Self-pay | Admitting: Obstetrics and Gynecology

## 2021-05-25 ENCOUNTER — Other Ambulatory Visit: Payer: Self-pay

## 2021-05-25 ENCOUNTER — Ambulatory Visit (INDEPENDENT_AMBULATORY_CARE_PROVIDER_SITE_OTHER): Payer: Medicare Other | Admitting: Obstetrics and Gynecology

## 2021-05-25 VITALS — BP 120/70 | HR 82 | Ht 66.0 in | Wt 187.0 lb

## 2021-05-25 DIAGNOSIS — L309 Dermatitis, unspecified: Secondary | ICD-10-CM | POA: Diagnosis not present

## 2021-05-25 DIAGNOSIS — L900 Lichen sclerosus et atrophicus: Secondary | ICD-10-CM | POA: Diagnosis not present

## 2021-05-25 DIAGNOSIS — Z01411 Encounter for gynecological examination (general) (routine) with abnormal findings: Secondary | ICD-10-CM

## 2021-05-25 NOTE — Patient Instructions (Signed)

## 2021-05-25 NOTE — Progress Notes (Signed)
75 y.o. [id number] Widowed White or Caucasian Not Hispanic or Latino female here for breast and pelvic.    She c/o some cramping in her right groin.   Patient states that her lichen is feeling much better.   No vaginal bleeding.    She has issues with chronic vulvitis, lichen sclerosis and perianal pruritus. Currently stable. She is using steroid ointment 2 x a week.   H/O anal fissure, using stool softeners. Her anal fissure is better.   She fell up the stair last week and hurt her knee.   Patient's last menstrual period was 08/14/1985 (approximate).          Sexually active: No.  The current method of family planning is post menopausal status.    Exercising: Yes.     Does yard work  Smoker:  no  Health Maintenance: Pap:  07/28/2016 WNL,  08/15/2012  Negative with neg HR HPV History of abnormal Pap:  no MMG:  05/18/20 density B Bi-rads 1 neg  BMD:   07/18/2018 Osteoporosis. She was previously on prolia, had side effects. She will f/u with her primary Colonoscopy: 2020 normal no f/u needed  TDaP:  2019 with pcp  Gardasil: n/a   reports that she has never smoked. She has never used smokeless tobacco. She reports that she does not currently use alcohol. She reports that she does not use drugs.  Past Medical History:  Diagnosis Date   COPD (chronic obstructive pulmonary disease) ([facility])    Glaucoma    History of kidney disease    History of kidney disease 06/11/2019   History of recurrent UTIs    Hyperlipidemia    Hypertension    Lichen sclerosus January 2017   vulva/perianal region   Non-melanoma skin cancer 08/2015   right leg   Osteoporosis    Post-menopausal     Prediabetes     Past Surgical History:  Procedure Laterality Date   CLOSED REDUCTION ELBOW FRACTURE Right 12/12/2019   Procedure: CLOSED REDUCTION RIGHT ELBOW;  Surgeon: Shona Needles, MD;  Location: [facility];  Service: Orthopedics;  Laterality: Right;   EYE SURGERY Right    Stent placement   FOOT SURGERY Right  06/2013   right 2nd toe   HEMATOMA EVACUATION Left 2007   left calf - excision of hemangioma with thrombus    Current Outpatient Medications  Medication Sig Dispense Refill   albuterol (VENTOLIN HFA) 108 (90 Base) MCG/ACT inhaler Inhale 2 puffs into the lungs every 6 (six) hours as needed for wheezing or shortness of breath.     amLODipine (NORVASC) 5 MG tablet Take 5 mg by mouth daily.     aspirin 81 MG tablet Take 81 mg by mouth daily.     aspirin-acetaminophen-caffeine (EXCEDRIN MIGRAINE) 250-250-65 MG tablet Take 1 tablet by mouth as needed for headache.     atorvastatin (LIPITOR) 20 MG tablet Take 1 tablet by mouth at bedtime.  11   budesonide-formoterol (SYMBICORT) 160-4.5 MCG/ACT inhaler Inhale 2 puffs into the lungs daily.      calcium carbonate (OS-CAL) 600 MG TABS tablet Take 600 mg by mouth 2 (two) times daily with a meal.     Cholecalciferol (VITAMIN D-3) 1000 UNITS CAPS Take 1,000 Units by mouth daily.      clobetasol ointment (TEMOVATE) 0.05 % Apply a small amount 2 x a week 60 g 0   Coenzyme Q10 (COQ-10 PO) Take 1 tablet by mouth daily.     diclofenac sodium (VOLTAREN) 1 %  GEL Apply 2 g topically 4 (four) times daily.     fluconazole (DIFLUCAN) 150 MG tablet Take one now and repeat in 72 hours if needed. 2 tablet 0   gabapentin (NEURONTIN) 100 MG capsule Take 1 capsule (100 mg total) by mouth at bedtime. 7 capsule 0   lidocaine (XYLOCAINE) 5 % ointment Apply 1 application topically 4 (four) times daily as needed. 30 g 1   losartan (COZAAR) 100 MG tablet Take 100 mg by mouth daily.      Misc Natural Products (FOCUSED MIND PO) Take 1 tablet by mouth daily.      Multiple Vitamins-Minerals (ZINC PO) Take 1 tablet by mouth daily.      polyethylene glycol (MIRALAX / GLYCOLAX) 17 g packet Take 17 g by mouth daily.      senna-docusate (SENOKOT-S) 8.6-50 MG tablet Take 2 tablets by mouth at bedtime as needed for mild constipation or moderate constipation. 15 tablet 0   traMADol  (ULTRAM) 50 MG tablet Take 1 tablet (50 mg total) by mouth at bedtime as needed. 30 tablet 0   Ubrogepant (UBRELVY) 50 MG TABS Take 50 mg by mouth as needed (may repeat once in 2 hours. no more than 2 pills in 24 h.). 10 tablet 3   vitamin C (ASCORBIC ACID) 250 MG tablet Take 250 mg by mouth daily.     Vitamins A & D (VITAMIN A & D) ointment Apply 1 application topically as needed for dry skin.     Cetirizine HCl 10 MG CAPS Take 1 capsule (10 mg total) by mouth daily for 5 days. 5 capsule 0   famotidine (PEPCID) 20 MG tablet Take 1 tablet (20 mg total) by mouth daily for 5 days. 5 tablet 0   No current facility-administered medications for this visit.    Family History  Problem Relation Age of Onset   Breast cancer Mother        deceased -42's   Heart attack Father    Heart attack Brother     Review of Systems  Exam:   BP 120/70   Pulse 82   Ht 5\' 6"  (1.676 m)   Wt 187 lb (84.8 kg)   LMP 08/14/1985 (Approximate)   SpO2 98%   BMI 30.18 kg/m   Weight change: @WEIGHTCHANGE @ Height:   Height: 5\' 6"  (167.6 cm)  Ht Readings from Last 3 Encounters:  05/25/21 5\' 6"  (1.676 m)  04/20/21 5\' 6"  (1.676 m)  04/08/21 5\' 6"  (1.676 m)    General appearance: alert, cooperative and appears stated age Head: Normocephalic, without obvious abnormality, atraumatic Neck: no adenopathy, supple, symmetrical, trachea midline and thyroid normal to inspection and palpation Breasts: normal appearance, no masses or tenderness Abdomen: soft, non-tender; non distended,  no masses,  no organomegaly Extremities: extremities normal, atraumatic, no cyanosis or edema Skin: Skin color, texture, turgor normal. No rashes or lesions Lymph nodes: Cervical, supraclavicular, and axillary nodes normal. No abnormal inguinal nodes palpated Neurologic: Grossly normal   Pelvic: External genitalia:  diffuse whitening and thinning of the skin, agglutination of labia 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 No fissures                Urethra:  normal appearing urethra with no masses, tenderness or lesions              Bartholins and Skenes: normal                 Vagina: mildly atrophic appearing vagina  with normal color and discharge, no lesions              Cervix: no lesions               Bimanual Exam:  Uterus:  normal size, contour, position, consistency, mobility, non-tender              Adnexa: no mass, fullness, tenderness               Rectovaginal: Confirms               Anus:  normal sphincter tone, no lesions  Perianal skin: diffuse whitening  Gae Dry chaperoned for the exam.  1. Encounter for gynecological examination with abnormal finding Discussed breast self exam Discussed calcium and vit D intake Mammogram due She will get her DEXA and lab work with her primary  2. Lichen sclerosus Stable Continue with 2 x a week steroid ointment (she will call when she needs a refill F/U in 3 months  3. Perianal dermatitis Stable Aware of skin care

## 2021-05-26 ENCOUNTER — Ambulatory Visit: Payer: Self-pay

## 2021-05-26 ENCOUNTER — Ambulatory Visit: Payer: Medicare Other | Admitting: Orthopedic Surgery

## 2021-05-26 DIAGNOSIS — M7989 Other specified soft tissue disorders: Secondary | ICD-10-CM | POA: Diagnosis not present

## 2021-05-26 DIAGNOSIS — M1711 Unilateral primary osteoarthritis, right knee: Secondary | ICD-10-CM

## 2021-05-26 DIAGNOSIS — M25559 Pain in unspecified hip: Secondary | ICD-10-CM | POA: Diagnosis not present

## 2021-05-27 ENCOUNTER — Ambulatory Visit (HOSPITAL_COMMUNITY)
Admission: RE | Admit: 2021-05-27 | Discharge: 2021-05-27 | Disposition: A | Discharge status: Home / Self Care | Payer: Medicare Other | Source: Ambulatory Visit | Attending: Orthopedic Surgery | Admitting: Orthopedic Surgery

## 2021-05-27 ENCOUNTER — Encounter: Payer: Self-pay | Admitting: Orthopedic Surgery

## 2021-05-27 ENCOUNTER — Other Ambulatory Visit: Payer: Self-pay

## 2021-05-27 DIAGNOSIS — M7989 Other specified soft tissue disorders: Secondary | ICD-10-CM

## 2021-05-27 MED ORDER — BUPIVACAINE HCL 0.25 % IJ SOLN
4.0000 mL | INTRAMUSCULAR | Status: AC | PRN
  Administered 2021-05-26: 4 mL via INTRA_ARTICULAR

## 2021-05-27 MED ORDER — METHYLPREDNISOLONE ACETATE 40 MG/ML IJ SUSP
40.0000 mg | INTRAMUSCULAR | Status: AC | PRN
  Administered 2021-05-26: 40 mg via INTRA_ARTICULAR

## 2021-05-27 MED ORDER — LIDOCAINE HCL 1 % IJ SOLN
5.0000 mL | INTRAMUSCULAR | Status: AC | PRN
  Administered 2021-05-26: 5 mL

## 2021-05-27 NOTE — Progress Notes (Signed)
Lower extremity venous has been completed.   Preliminary results in CV Proc.   Tanicia Wolaver Eupha Lobb 05/27/2021 11:12 AM

## 2021-05-27 NOTE — Progress Notes (Signed)
Office Visit Note   Patient: [name]           Date of Birth: 1946/01/07           MRN: [id number] Visit Date: 05/26/2021 Requested by: Jilda Panda, MD [id number],  [place] [id number] PCP: Jilda Panda, MD  Subjective: Chief Complaint  Patient presents with   Right Knee - Pain    HPI: Marijean is a 75 year old patient with bilateral leg cramping and right knee pain.  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  She has tried over-the-counter medication for the cramping issue.  Reports cramping both in the groin and leg as well as the calf.  Tends to happen at night.  She also has a known history of right knee arthritis and is requesting injection today.  She has been ambulating.              ROS: All systems reviewed are negative as they relate to the chief complaint within the history of present illness.  Patient denies  fevers or chills.   Assessment & Plan: Visit Diagnoses:  1. Leg swelling   2. Hip pain     Plan: Impression is bilateral leg swelling and right knee arthritis.  Right knee injection performed today.  Negative Homans but she does have mild swelling in both legs.  No groin pain with internal or external rotation of the leg.  Plan is right knee cortisone injection today with ultrasound bilateral lower extremities to rule out DVT.  I can call her with those results.  Also suggested that she try coconut water for the cramping issue.  She has had lab work which demonstrates normal metabolic parameters.  Follow-Up Instructions: No follow-ups on file.   Orders:  Orders Placed This Encounter  Procedures   VAS [place] LOWER EXTREMITY VENOUS (DVT)   No orders of the defined types were placed in this encounter.     Procedures: Large Joint Inj: R knee on 05/26/2021 7:20 AM Indications: diagnostic evaluation, joint swelling and pain Details: 18 G 1.5 in needle, superolateral approach  Arthrogram: No  Medications: 5 mL  lidocaine 1 %; 40 mg methylPREDNISolone acetate 40 MG/ML; 4 mL bupivacaine 0.25 % Outcome: tolerated well, no immediate complications Procedure, treatment alternatives, risks and benefits explained, specific risks discussed. Consent was given by the patient. Immediately prior to procedure a time out was called to verify the correct patient, procedure, equipment, support staff and site/side marked as required. Patient was prepped and draped in the usual sterile fashion.      Clinical Data: No additional findings.  Objective: Vital Signs: LMP 08/14/1985 (Approximate)   Physical Exam:   Constitutional: Patient appears well-developed HEENT:  Head: Normocephalic Eyes:EOM are normal Neck: Normal range of motion Cardiovascular: Normal rate Pulmonary/chest: Effort normal Neurologic: Patient is alert Skin: Skin is warm Psychiatric: Patient has normal mood and affect   Ortho Exam: Ortho exam demonstrates palpable pedal pulses.  No effusion in either knee.  Negative Homans.  Mild swelling bilateral lower extremities.  No groin pain with internal and external rotation of the right or left leg.  Patient has 5 out of 5 ankle dorsiflexion plantarflexion quad hamstring strength.  No masses lymphadenopathy or skin changes noted in that right knee region.  Specialty Comments:  No specialty comments available.  Imaging: No results found.   PMFS History: Patient Active Problem List   Diagnosis Date Noted   Vaso vagal episode  12/12/2019   Closed posterior dislocation of right elbow 12/12/2019   Essential hypertension 12/12/2019   History of kidney disease 06/11/2019   Prediabetes    Status post foot surgery 06/27/2013   Other hammer toe (acquired) 06/10/2013   Pain in foot 06/10/2013   Onychomycosis 06/10/2013   Past Medical History:  Diagnosis Date   COPD (chronic obstructive pulmonary disease) ([facility])    Glaucoma    History of kidney disease    History of kidney disease 06/11/2019    History of recurrent UTIs    Hyperlipidemia    Hypertension    Lichen sclerosus January 2017   vulva/perianal region   Non-melanoma skin cancer 08/2015   right leg   Osteoporosis    Post-menopausal     Prediabetes     Family History  Problem Relation Age of Onset   Breast cancer Mother        deceased -62's   Heart attack Father    Heart attack Brother     Past Surgical History:  Procedure Laterality Date   CLOSED REDUCTION ELBOW FRACTURE Right 12/12/2019   Procedure: CLOSED REDUCTION RIGHT ELBOW;  Surgeon: Shona Needles, MD;  Location: [facility];  Service: Orthopedics;  Laterality: Right;   EYE SURGERY Right    Stent placement   FOOT SURGERY Right 06/2013   right 2nd toe   HEMATOMA EVACUATION Left 2007   left calf - excision of hemangioma with thrombus   Social History   Occupational History   Not on file  Tobacco Use   Smoking status: Never   Smokeless tobacco: Never  Vaping Use   Vaping Use: Never used  Substance and Sexual Activity   Alcohol use: Not Currently   Drug use: No   Sexual activity: Not Currently    Birth control/protection: Post-menopausal

## 2021-06-02 ENCOUNTER — Other Ambulatory Visit: Payer: Self-pay | Admitting: Internal Medicine

## 2021-06-02 DIAGNOSIS — Z1231 Encounter for screening mammogram for malignant neoplasm of breast: Secondary | ICD-10-CM

## 2021-06-06 ENCOUNTER — Telehealth: Payer: Self-pay | Admitting: *Deleted

## 2021-06-06 NOTE — Telephone Encounter (Signed)
Patient called Triage requesting [place] to give her a call. No details was left pertaining to why she needs [place] to call her. I left her a message requesting a call back to Triage.

## 2021-06-06 NOTE — Telephone Encounter (Addendum)
Pt states she called to discuss why she was referred to [facility]. I explained it was because of Urinary incontinence

## 2021-06-09 ENCOUNTER — Other Ambulatory Visit: Payer: Self-pay

## 2021-06-09 ENCOUNTER — Ambulatory Visit
Admission: RE | Admit: 2021-06-09 | Discharge: 2021-06-09 | Disposition: A | Discharge status: Home / Self Care | Payer: Medicare Other | Source: Ambulatory Visit | Attending: Internal Medicine | Admitting: Internal Medicine

## 2021-06-09 DIAGNOSIS — Z1231 Encounter for screening mammogram for malignant neoplasm of breast: Secondary | ICD-10-CM

## 2021-06-14 ENCOUNTER — Other Ambulatory Visit: Payer: Self-pay | Admitting: Internal Medicine

## 2021-06-14 DIAGNOSIS — R928 Other abnormal and inconclusive findings on diagnostic imaging of breast: Secondary | ICD-10-CM

## 2021-07-01 ENCOUNTER — Other Ambulatory Visit: Payer: Self-pay

## 2021-07-01 ENCOUNTER — Ambulatory Visit: Payer: Medicare Other

## 2021-07-01 ENCOUNTER — Ambulatory Visit
Admission: RE | Admit: 2021-07-01 | Discharge: 2021-07-01 | Disposition: A | Discharge status: Home / Self Care | Payer: Medicare Other | Source: Ambulatory Visit | Attending: Internal Medicine | Admitting: Internal Medicine

## 2021-07-01 DIAGNOSIS — R928 Other abnormal and inconclusive findings on diagnostic imaging of breast: Secondary | ICD-10-CM

## 2021-07-01 IMAGING — MG MM DIGITAL DIAGNOSTIC UNILAT*R* W/ TOMO W/ CAD
4 series · 4 of 12 positions shown · non-contrast
Comparison: Previous exam(s).

CLINICAL DATA: 74-year-old female recalled from screening mammogram
dated [DATE] for a possible right breast asymmetry.

EXAM:
DIGITAL DIAGNOSTIC UNILATERAL RIGHT MAMMOGRAM WITH TOMOSYNTHESIS AND
CAD
TECHNIQUE: Right digital diagnostic mammography and breast tomosynthesis was
performed. The images were evaluated with computer-aided detection.

[R ML synth-2D]
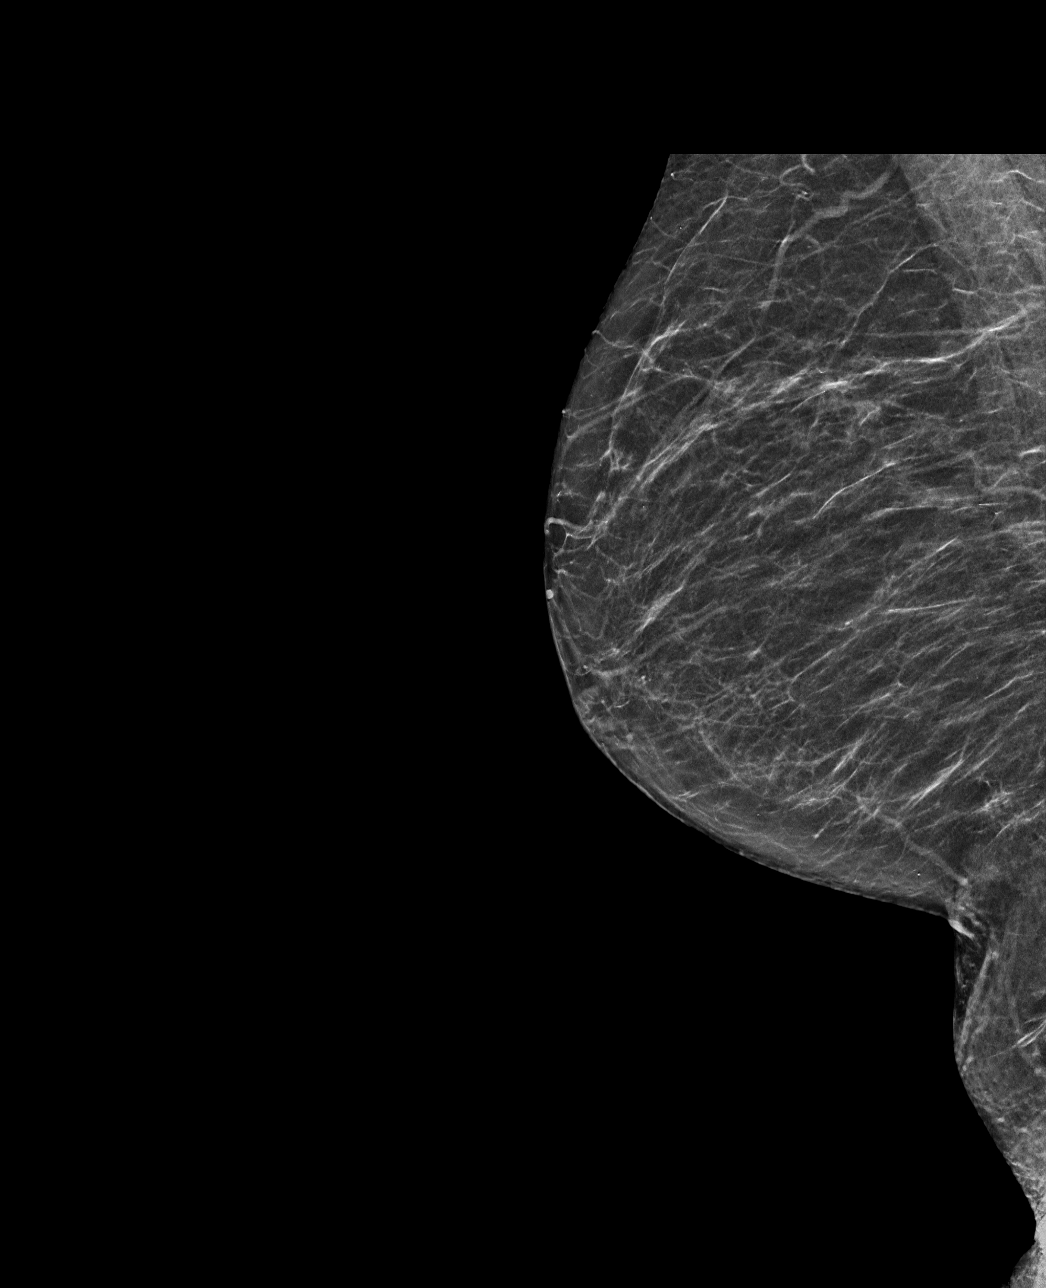

[R CC synth-2D]
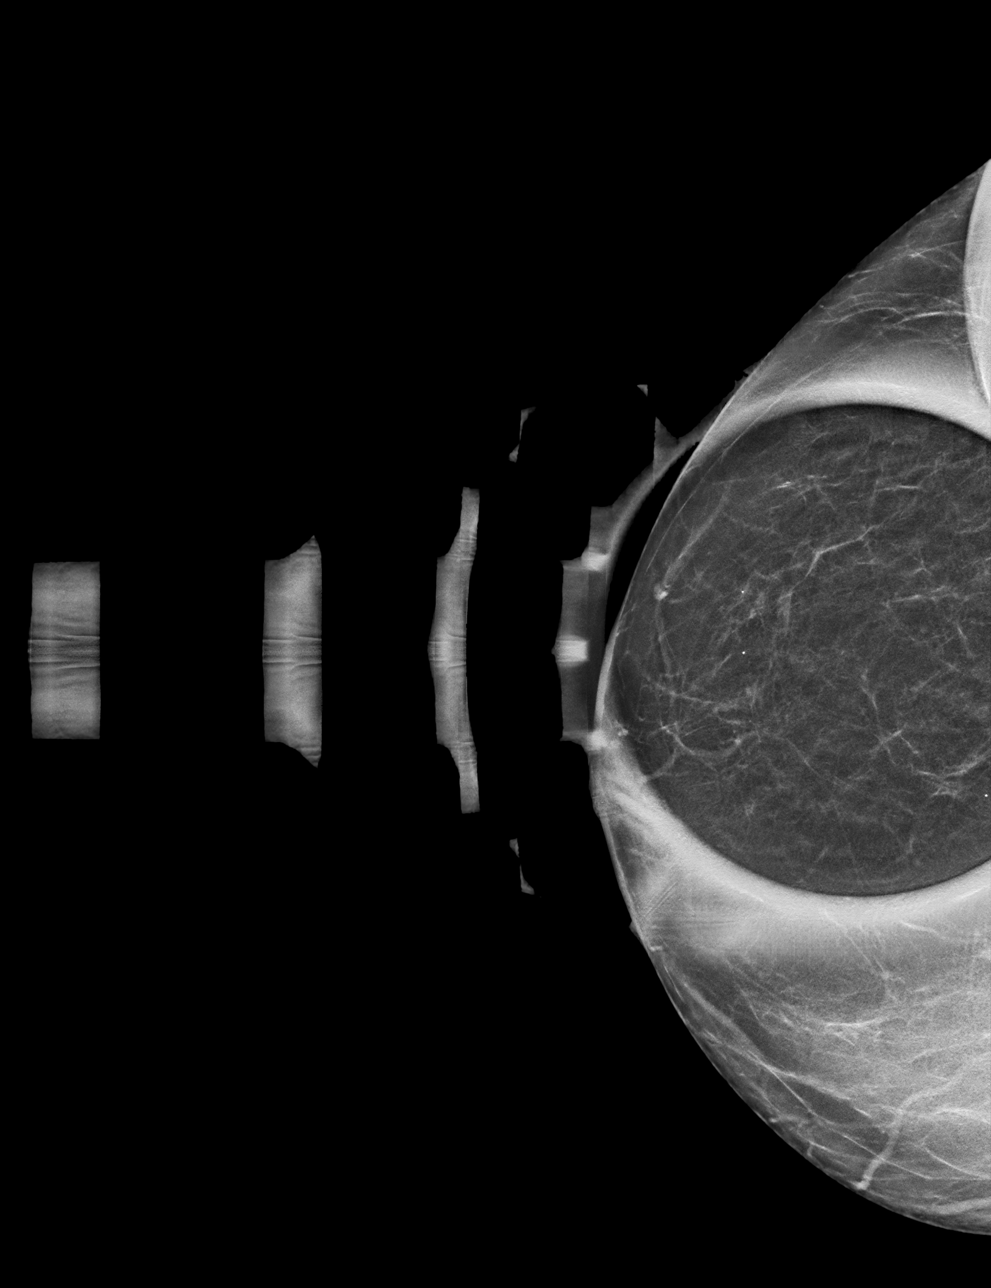

[R ML tomo · tomo slice 27/53.0]
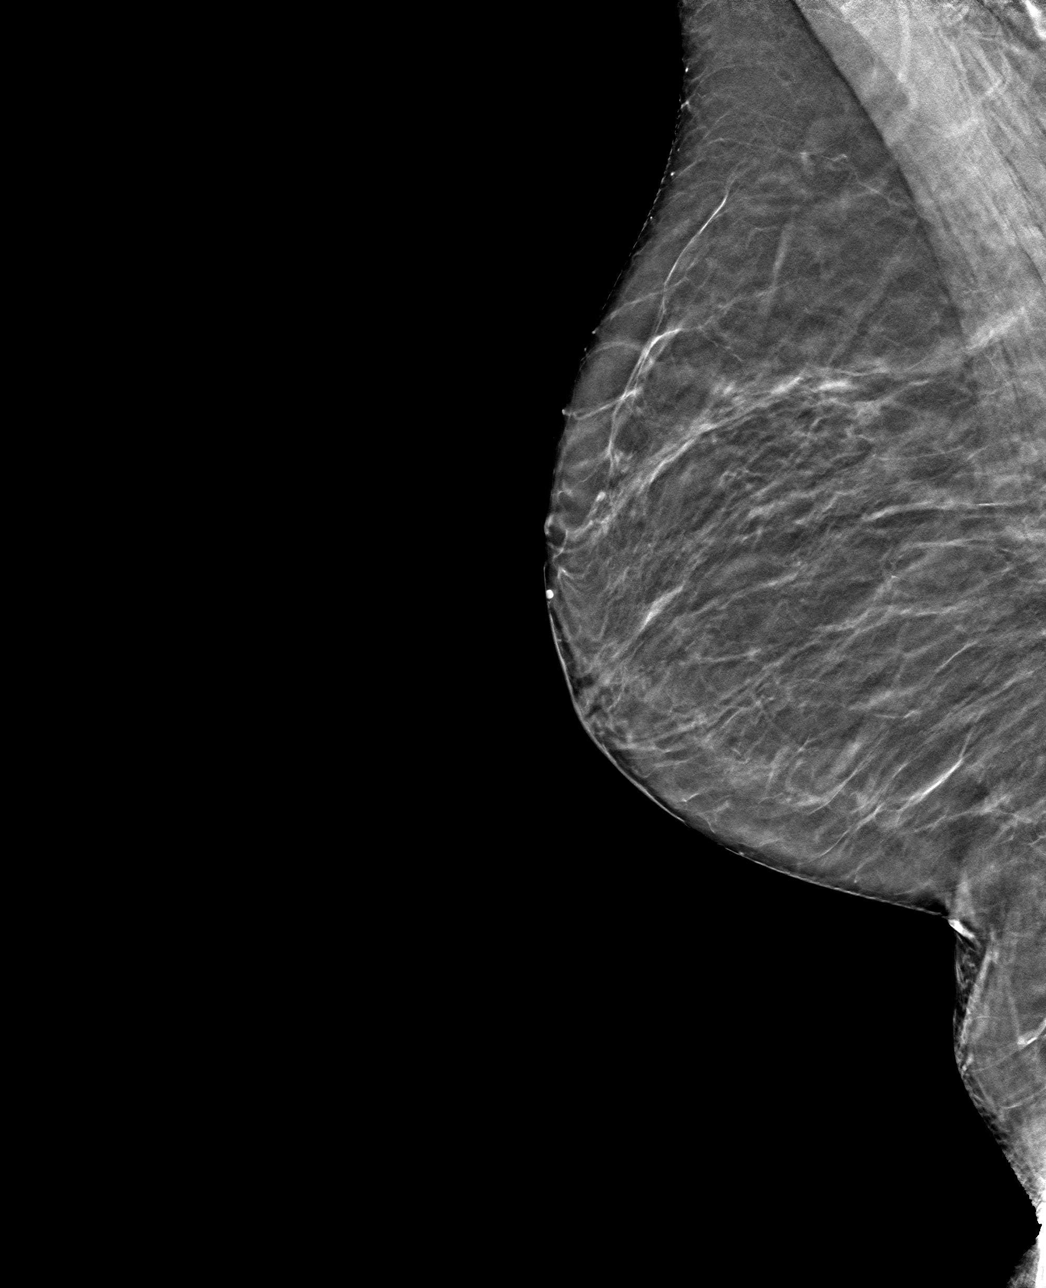

[R CC tomo · tomo slice 23/45.0]
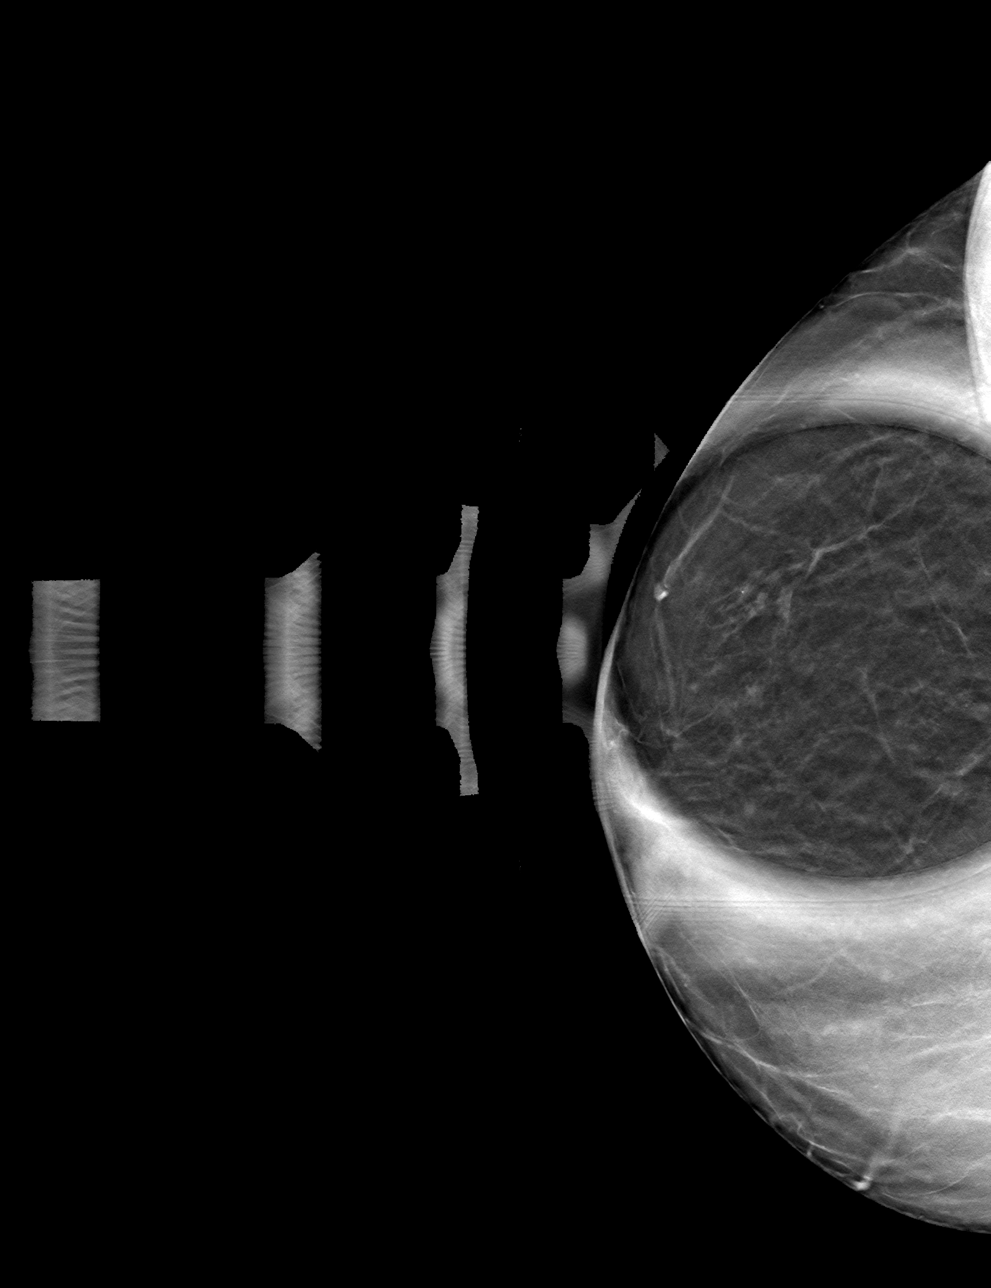

[4 of 12 positions shown; findings below may reference images not displayed]

ACR Breast Density Category b: There are scattered areas of
fibroglandular density.
FINDINGS: Previously described, possible asymmetry in the lateral right breast
at anterior depth is seen on the cc projection resolves into well
dispersed fibroglandular tissue on today's additional views. No
suspicious findings are identified.
IMPRESSION: No mammographic evidence of malignancy.

RECOMMENDATION:
Screening mammogram in one year.(Code:[XO])

I have discussed the findings and recommendations with the patient.
If applicable, a reminder letter will be sent to the patient
regarding the next appointment.

BI-RADS CATEGORY  1: Negative.

## 2021-08-01 ENCOUNTER — Telehealth: Payer: Self-pay | Admitting: *Deleted

## 2021-08-01 NOTE — Telephone Encounter (Signed)
Patient aware,message sent to appointment to put her schedule.

## 2021-08-01 NOTE — Telephone Encounter (Signed)
Please reassure her that I don't think she has vulvar cancer. She was last seen 2 months ago. She does have thin tissue and can easily bruise, I suspect that is what she is seeing. Unfortunately today is packed, but you can squeeze her in at 10:45 on Wednesday.

## 2021-08-01 NOTE — Telephone Encounter (Signed)
Patient called has Lichen sclerosus reports the clobetasol ointment has been helping with discomfort. Patient reports for the last 2 months she wears a pad to help with urine leakage while running errands. Patient said yesterday she noticed vulva pain, she went to bathroom and used a mirror to look at this area and reports this aware was black like it has been bruised. Patient is very worried about having cancer( she researched on google) and would like to be seen today. Please advise if you want her worked in today or okay for this week sometime?

## 2021-08-03 ENCOUNTER — Telehealth: Payer: Self-pay | Admitting: Orthopedic Surgery

## 2021-08-03 ENCOUNTER — Encounter: Payer: Self-pay | Admitting: Obstetrics and Gynecology

## 2021-08-03 ENCOUNTER — Other Ambulatory Visit: Payer: Self-pay

## 2021-08-03 ENCOUNTER — Ambulatory Visit: Payer: Medicare Other | Admitting: Obstetrics and Gynecology

## 2021-08-03 VITALS — BP 120/70 | HR 73 | Ht 66.0 in | Wt 186.0 lb

## 2021-08-03 DIAGNOSIS — N763 Subacute and chronic vulvitis: Secondary | ICD-10-CM

## 2021-08-03 NOTE — Progress Notes (Signed)
GYNECOLOGY  VISIT   HPI: 75 y.o.   Widowed White or Caucasian Not Hispanic or Latino  female   [phone] with Patient's last menstrual period was 08/14/1985 (approximate).   here for Vulvar check up.  She states that she has a black area on her vulva that has reduced in size by half in the last few days. No trauma. She has a h/o chronic vulvitis, lichen sclerosis and perianal dermatitis. Occasional use of steroids.   GYNECOLOGIC HISTORY: Patient's last menstrual period was 08/14/1985 (approximate). Contraception:pmp  Menopausal hormone therapy: none         OB History     Gravida  6   Para  1   Term  1   Preterm  0   AB  5   Living  1      SAB  5   IAB  0   Ectopic  0   Multiple  0   Live Births  1              Patient Active Problem List   Diagnosis Date Noted   Vaso vagal episode 12/12/2019   Closed posterior dislocation of right elbow 12/12/2019   Essential hypertension 12/12/2019   History of kidney disease 06/11/2019   Prediabetes    Status post foot surgery 06/27/2013   Other hammer toe (acquired) 06/10/2013   Pain in foot 06/10/2013   Onychomycosis 06/10/2013    Past Medical History:  Diagnosis Date   COPD (chronic obstructive pulmonary disease) ([facility])    Glaucoma    History of kidney disease    History of kidney disease 06/11/2019   History of recurrent UTIs    Hyperlipidemia    Hypertension    Lichen sclerosus January 2017   vulva/perianal region   Non-melanoma skin cancer 08/2015   right leg   Osteoporosis    Post-menopausal     Prediabetes     Past Surgical History:  Procedure Laterality Date   CLOSED REDUCTION ELBOW FRACTURE Right 12/12/2019   Procedure: CLOSED REDUCTION RIGHT ELBOW;  Surgeon: Shona Needles, MD;  Location: [facility];  Service: Orthopedics;  Laterality: Right;   EYE SURGERY Right    Stent placement   FOOT SURGERY Right 06/2013   right 2nd toe   HEMATOMA EVACUATION Left 2007   left calf - excision of hemangioma  with thrombus    Current Outpatient Medications  Medication Sig Dispense Refill   albuterol (VENTOLIN HFA) 108 (90 Base) MCG/ACT inhaler Inhale 2 puffs into the lungs every 6 (six) hours as needed for wheezing or shortness of breath.     amLODipine (NORVASC) 5 MG tablet Take 5 mg by mouth daily.     aspirin 81 MG tablet Take 81 mg by mouth daily.     aspirin-acetaminophen-caffeine (EXCEDRIN MIGRAINE) 250-250-65 MG tablet Take 1 tablet by mouth as needed for headache.     atorvastatin (LIPITOR) 20 MG tablet Take 1 tablet by mouth at bedtime.  11   budesonide-formoterol (SYMBICORT) 160-4.5 MCG/ACT inhaler Inhale 2 puffs into the lungs daily.      calcium carbonate (OS-CAL) 600 MG TABS tablet Take 600 mg by mouth 2 (two) times daily with a meal.     Cetirizine HCl 10 MG CAPS Take 1 capsule (10 mg total) by mouth daily for 5 days. 5 capsule 0   Cholecalciferol (VITAMIN D-3) 1000 UNITS CAPS Take 1,000 Units by mouth daily.      clobetasol ointment (TEMOVATE) 0.05 % Apply a  small amount 2 x a week 60 g 0   Coenzyme Q10 (COQ-10 PO) Take 1 tablet by mouth daily.     diclofenac sodium (VOLTAREN) 1 % GEL Apply 2 g topically 4 (four) times daily.     famotidine (PEPCID) 20 MG tablet Take 1 tablet (20 mg total) by mouth daily for 5 days. 5 tablet 0   fluconazole (DIFLUCAN) 150 MG tablet Take one now and repeat in 72 hours if needed. 2 tablet 0   gabapentin (NEURONTIN) 100 MG capsule Take 1 capsule (100 mg total) by mouth at bedtime. 7 capsule 0   lidocaine (XYLOCAINE) 5 % ointment Apply 1 application topically 4 (four) times daily as needed. 30 g 1   losartan (COZAAR) 100 MG tablet Take 100 mg by mouth daily.      Misc Natural Products (FOCUSED MIND PO) Take 1 tablet by mouth daily.      Multiple Vitamins-Minerals (ZINC PO) Take 1 tablet by mouth daily.      polyethylene glycol (MIRALAX / GLYCOLAX) 17 g packet Take 17 g by mouth daily.      senna-docusate (SENOKOT-S) 8.6-50 MG tablet Take 2 tablets by  mouth at bedtime as needed for mild constipation or moderate constipation. 15 tablet 0   traMADol (ULTRAM) 50 MG tablet Take 1 tablet (50 mg total) by mouth at bedtime as needed. 30 tablet 0   Ubrogepant (UBRELVY) 50 MG TABS Take 50 mg by mouth as needed (may repeat once in 2 hours. no more than 2 pills in 24 h.). 10 tablet 3   vitamin C (ASCORBIC ACID) 250 MG tablet Take 250 mg by mouth daily.     Vitamins A & D (VITAMIN A & D) ointment Apply 1 application topically as needed for dry skin.     No current facility-administered medications for this visit.     ALLERGIES: Contrast media [iodinated diagnostic agents], Ether, and Iohexol  Family History  Problem Relation Age of Onset   Heart attack Father    Heart attack Brother     Social History   Socioeconomic History   Marital status: Widowed    Spouse name: Not on file   Number of children: Not on file   Years of education: Not on file   Highest education level: Not on file  Occupational History   Not on file  Tobacco Use   Smoking status: Never   Smokeless tobacco: Never  Vaping Use   Vaping Use: Never used  Substance and Sexual Activity   Alcohol use: Not Currently   Drug use: No   Sexual activity: Not Currently    Birth control/protection: Post-menopausal  Other Topics Concern   Not on file  Social History Narrative   Not on file   Social Determinants of Health   Financial Resource Strain: Not on file  Food Insecurity: Not on file  Transportation Needs: Not on file  Physical Activity: Not on file  Stress: Not on file  Social Connections: Not on file  Intimate Partner Violence: Not on file    Review of Systems  All other systems reviewed and are negative.  PHYSICAL EXAMINATION:    LMP 08/14/1985 (Approximate)     General appearance: alert, cooperative and appears stated age   Pelvic: External genitalia: diffuse whitening and thinning of the vulvar and perianal skin. Some loss of vulvar architecture. On  the left vulva is a several cm area of ecchymosis/hematoma.  Urethra:  normal appearing urethra with no masses, tenderness or lesions              Bartholins and Skenes: normal                  Chaperone was present for exam.  1. Chronic vulvitis Area of concern is an area of ecchymosis.  Patient reassured

## 2021-08-03 NOTE — Telephone Encounter (Signed)
Ok to renew handicap placard? For how long?

## 2021-08-03 NOTE — Telephone Encounter (Signed)
Pt called asking for a renewal for handicap placard. Please call when ready for pick up. Pt phone number is [phone].

## 2021-08-03 NOTE — Telephone Encounter (Signed)
Yes 6 mos thx

## 2021-08-04 NOTE — Telephone Encounter (Signed)
IC advised could pick up at front desk.  

## 2021-08-29 ENCOUNTER — Other Ambulatory Visit: Payer: Self-pay

## 2021-08-29 ENCOUNTER — Ambulatory Visit: Payer: Medicare Other | Admitting: Orthopedic Surgery

## 2021-08-29 ENCOUNTER — Ambulatory Visit: Payer: Self-pay

## 2021-08-29 DIAGNOSIS — M79672 Pain in left foot: Secondary | ICD-10-CM | POA: Diagnosis not present

## 2021-08-31 ENCOUNTER — Encounter: Payer: Self-pay | Admitting: Orthopedic Surgery

## 2021-08-31 NOTE — Progress Notes (Signed)
Office Visit Note   Patient: [name]           Date of Birth: 1946/01/17           MRN: [id number] Visit Date: 08/29/2021 Requested by: Melissa Panda, MD [address] [place],  [place] [address] PCP: Melissa Panda, MD  Subjective: Chief Complaint  Patient presents with   Left Foot - Follow-up    HPI: [name] is a 76 year old patient with left foot pain.  She describes having constant aching and sore midfoot on the left-hand side.  She has been ambulating with a cane.  Takes occasional Tylenol for her symptoms.  That has not been helping much.  No prior surgeries on the left foot.  She uses a support sleeve around the midfoot.  Hurts her to walk long distances.  Symptoms have been ongoing now for longer than 2 months.              ROS: All systems reviewed are negative as they relate to the chief complaint within the history of present illness.  Patient denies  fevers or chills.   Assessment & Plan: Visit Diagnoses:  1. Pain in left foot     Plan: Impression is left-sided midfoot pain primarily along the medial column.  Anterior tib tendon is functional and intact.  I think this could be arthritis or stress fracture/stress reaction.  Based on the amount of symptoms she having this could also be an occult fracture.  Plan MRI scan of the left foot to evaluate midfoot arthritis versus occult fracture along the first ray region.  See her back after that study.  Follow-Up Instructions: Return for after MRI.   Orders:  Orders Placed This Encounter  Procedures   XR Foot Complete Left   MR Foot Left w/o contrast   No orders of the defined types were placed in this encounter.     Procedures: No procedures performed   Clinical Data: No additional findings.  Objective: Vital Signs: LMP 08/14/1985 (Approximate)   Physical Exam:   Constitutional: Patient appears well-developed HEENT:  Head: Normocephalic Eyes:EOM are normal Neck: Normal range of  motion Cardiovascular: Normal rate Pulmonary/chest: Effort normal Neurologic: Patient is alert Skin: Skin is warm Psychiatric: Patient has normal mood and affect   Ortho Exam: Ortho exam demonstrates that the patient has very nice shoes that she is wearing to have good support.  She does have palpable pedal pulses in both feet.  Palpable intact nontender anterior to posterior to peroneal and Achilles tendons.  No midfoot collapse with standing.  She is able to stand on her toes.  Mild pain with pronation supination of the forefoot on the left-hand side.  No discrete swelling.  Most of her pain localizes around the talonavicular and first tarsometatarsal joint.  Flexor and extensor tendon function to the toes intact.  Specialty Comments:  No specialty comments available.  Imaging: No results found.   PMFS History: Patient Active Problem List   Diagnosis Date Noted   Vaso vagal episode 12/12/2019   Closed posterior dislocation of right elbow 12/12/2019   Essential hypertension 12/12/2019   History of kidney disease 06/11/2019   Prediabetes    Status post foot surgery 06/27/2013   Other hammer toe (acquired) 06/10/2013   Pain in foot 06/10/2013   Onychomycosis 06/10/2013   Past Medical History:  Diagnosis Date   COPD (chronic obstructive pulmonary disease) (HCC)    Glaucoma    History of kidney disease  History of kidney disease 06/11/2019   History of recurrent UTIs    Hyperlipidemia    Hypertension    Lichen sclerosus January 2017   vulva/perianal region   Non-melanoma skin cancer 08/2015   right leg   Osteoporosis    Post-menopausal     Prediabetes     Family History  Problem Relation Age of Onset   Heart attack Father    Heart attack Brother     Past Surgical History:  Procedure Laterality Date   CLOSED REDUCTION ELBOW FRACTURE Right 12/12/2019   Procedure: CLOSED REDUCTION RIGHT ELBOW;  Surgeon: Shona Needles, MD;  Location: [facility];  Service: Orthopedics;   Laterality: Right;   EYE SURGERY Right    Stent placement   FOOT SURGERY Right 06/2013   right 2nd toe   HEMATOMA EVACUATION Left 2007   left calf - excision of hemangioma with thrombus   Social History   Occupational History   Not on file  Tobacco Use   Smoking status: Never   Smokeless tobacco: Never  Vaping Use   Vaping Use: Never used  Substance and Sexual Activity   Alcohol use: Not Currently   Drug use: No   Sexual activity: Not Currently    Birth control/protection: Post-menopausal

## 2021-09-07 ENCOUNTER — Ambulatory Visit
Admission: RE | Admit: 2021-09-07 | Discharge: 2021-09-07 | Disposition: A | Discharge status: Home / Self Care | Payer: Medicare Other | Source: Ambulatory Visit | Attending: Orthopedic Surgery | Admitting: Orthopedic Surgery

## 2021-09-07 ENCOUNTER — Other Ambulatory Visit: Payer: Self-pay

## 2021-09-07 DIAGNOSIS — M79672 Pain in left foot: Secondary | ICD-10-CM

## 2021-09-07 IMAGING — MR MR FOOT*L* W/O CM
5 series · 37 of 40 positions shown · non-contrast
Comparison: X-ray foot [DATE].

CLINICAL DATA: Left mid foot/medial metatarsal pain for years.
Clinical concern for osteoarthritis versus occult fracture.

EXAM:
MRI OF THE LEFT FOOT WITHOUT CONTRAST
TECHNIQUE: Multiplanar, multisequence MR imaging of the left hindfoot was
performed. No intravenous contrast was administered.

[Series 4: T2 fat-sat · axial · 3.0mm · 0.50mm/px · z∈[-85,+43]mm · 8 of 34 slices shown (1 of 2)]
[im 1/34]
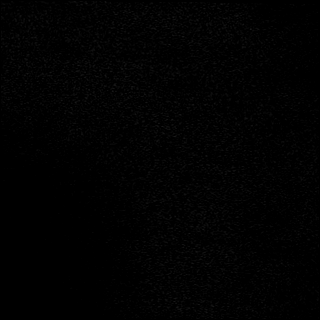
[im 4/34]
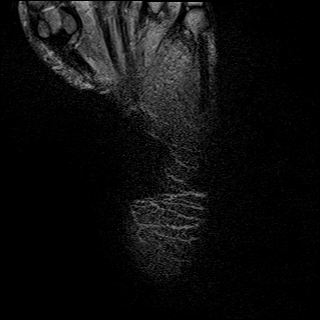
[im 12/34]
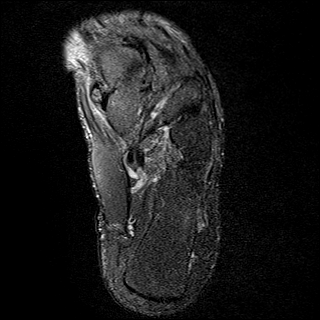
[im 15/34]
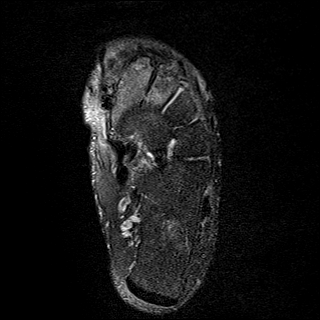
[im 19/34]
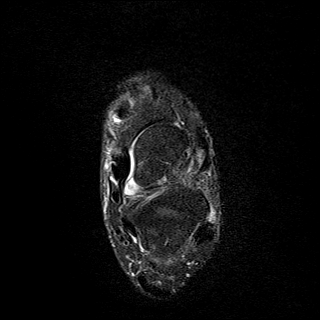
[im 23/34]
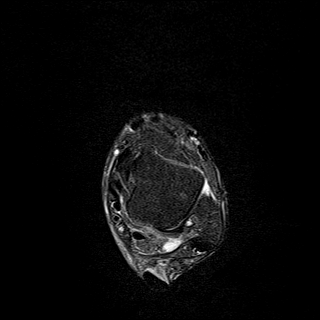
[im 30/34]
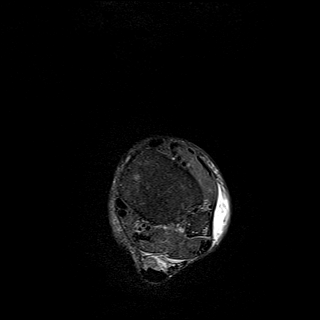
[im 34/34]
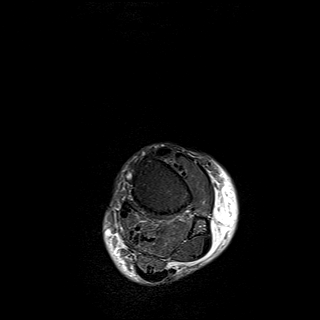

[Series 5: PD fat-sat · axial · 3.0mm · 0.42mm/px · z∈[-85,+43]mm · 9 of 34 slices shown]
[im 1/34]
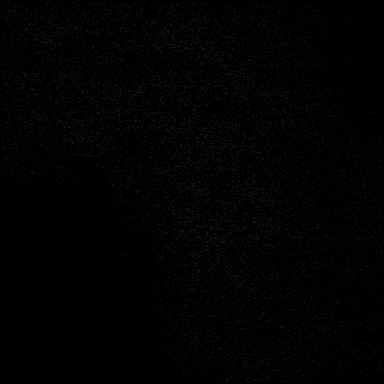
[im 5/34]
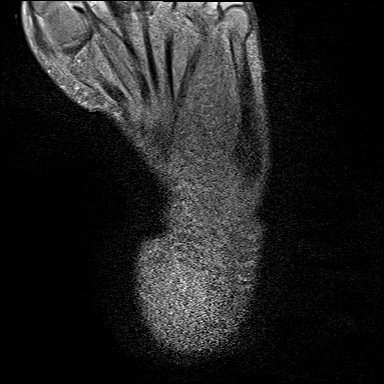
[im 9/34]
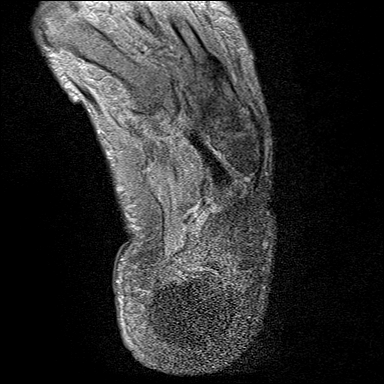
[im 13/34]
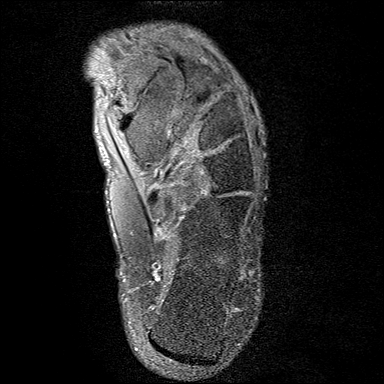
[im 17/34]
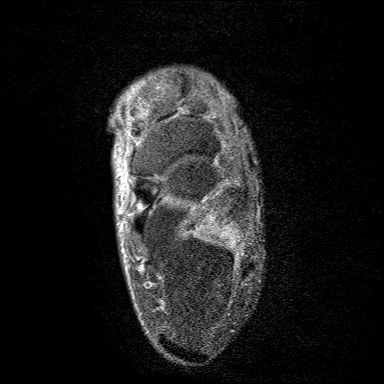
[im 21/34]
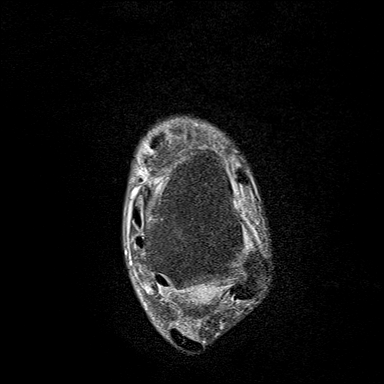
[im 25/34]
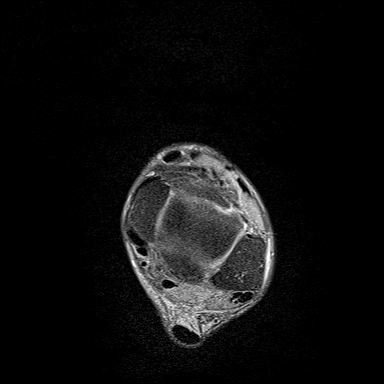
[im 29/34]
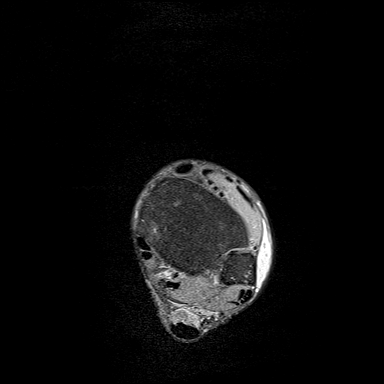
[im 34/34]
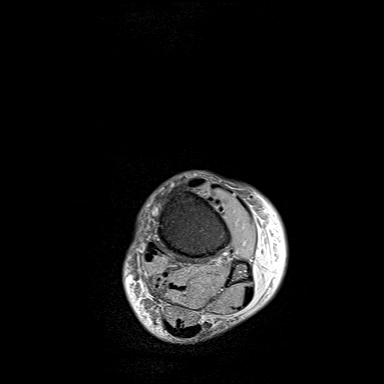

[Series 6: T1 · sagittal · 4.0mm · 0.56mm/px · 5 of 20 slices shown]
[im 1/20]
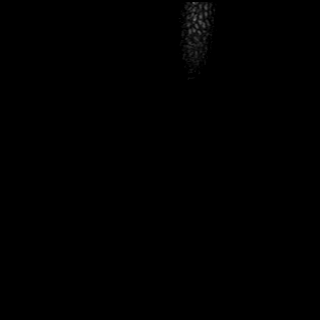
[im 5/20]
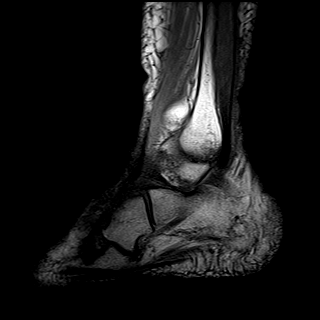
[im 10/20]
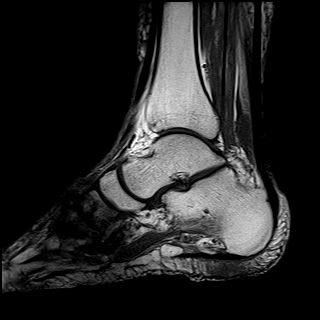
[im 15/20]
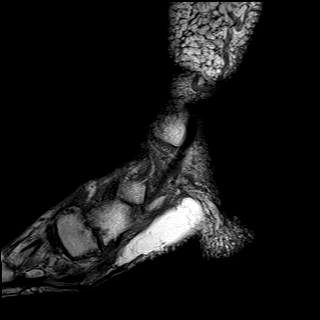
[im 20/20]
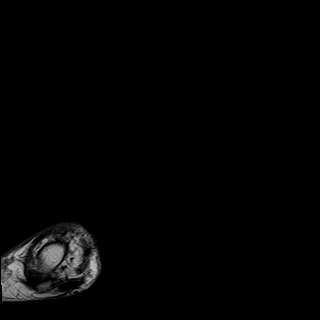

[Series 7: STIR · sagittal · 4.0mm · 0.35mm/px · 4 of 20 slices shown]
[im 1/20]
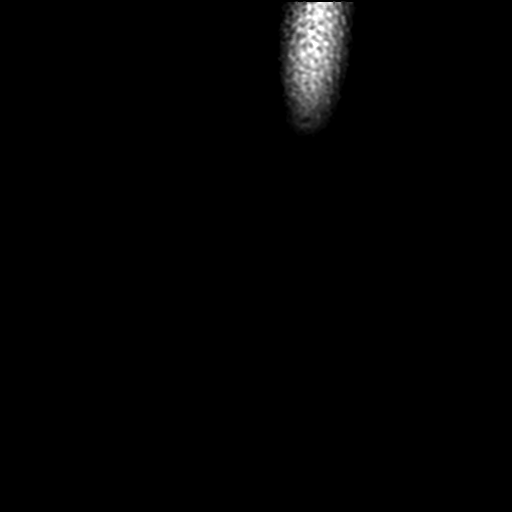
[im 5/20]
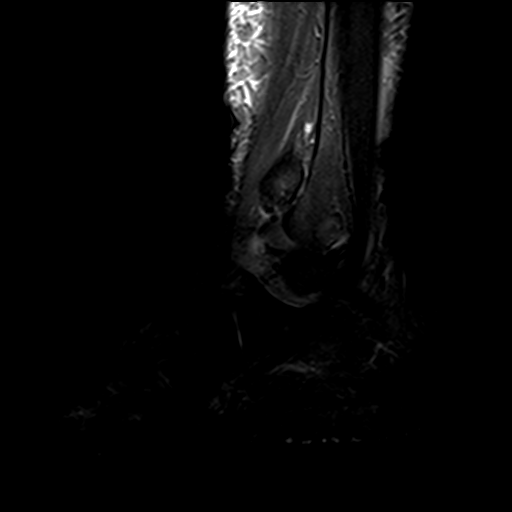
[im 10/20]
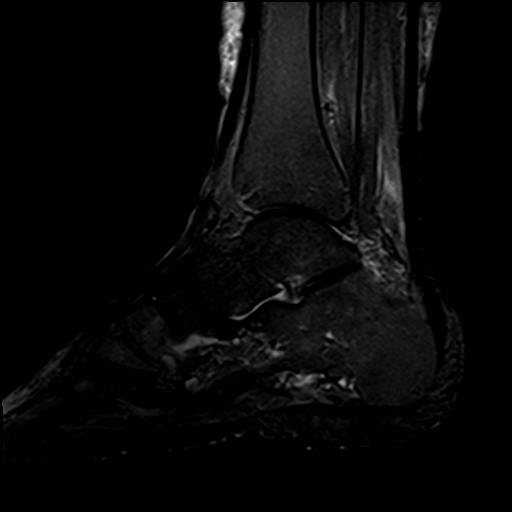
[im 15/20]
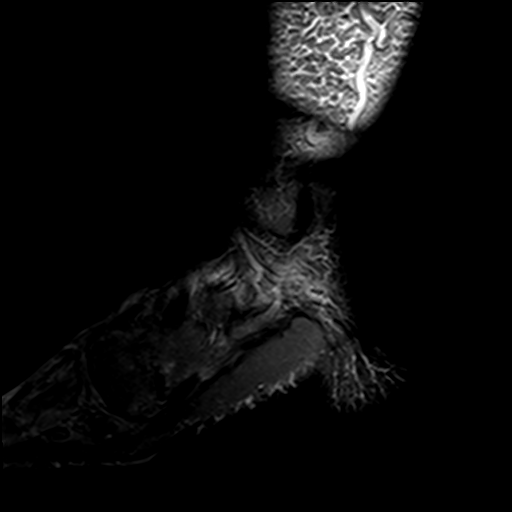

[Series 8: T2 fat-sat · coronal · 3.0mm · 0.50mm/px · 11 of 40 slices shown (2 of 2)]
[im 1/40]
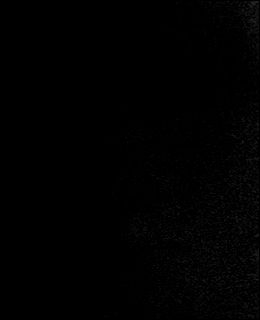
[im 4/40]
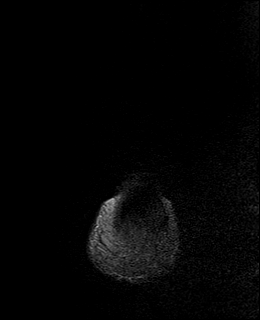
[im 8/40]
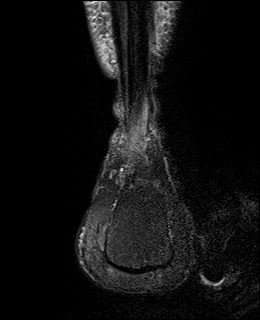
[im 12/40]
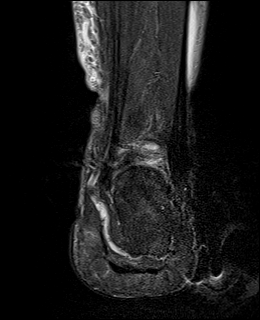
[im 16/40]
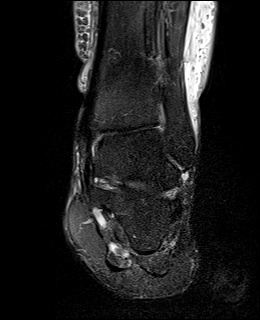
[im 20/40]
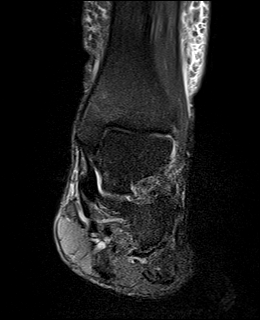
[im 24/40]
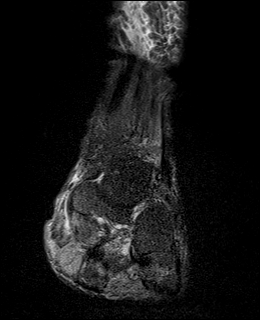
[im 28/40]
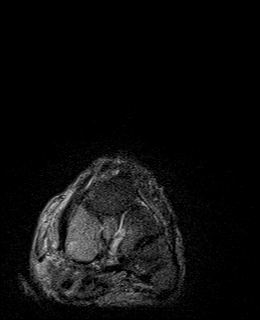
[im 32/40]
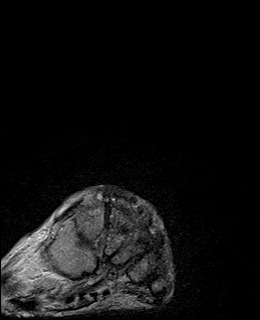
[im 36/40]
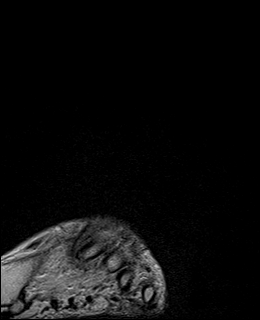
[im 40/40]
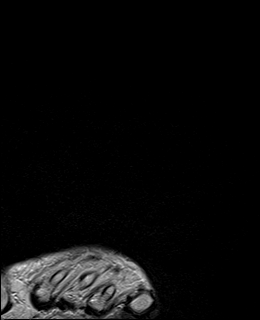

[37 of 40 positions shown; findings below may reference images not displayed]

FINDINGS: TENDONS

Peroneal: Peroneus longus and brevis tendons are intact and normally
positioned.

Posteromedial: Tibialis posterior, flexor hallucis longus, and
flexor digitorum longus tendons are intact and normally positioned.

Anterior: Tibialis anterior, extensor hallucis longus, and extensor
digitorum longus tendons are intact and normally positioned.

Achilles: Intact.

Plantar Fascia: Intact.

LIGAMENTS

Lateral: Intact tibiofibular ligaments. The anterior and posterior
talofibular ligaments are intact. Intact calcaneofibular ligament.

Medial: The deltoid and visualized portions of the spring ligament
appear intact.

CARTILAGE AND BONES

Ankle Joint: No significant ankle joint effusion. The talar dome and
tibial plafond are intact.

Subtalar Joints/Sinus Tarsi: Mild subtalar joint space narrowing. No
effusion. Preservation of the anatomic fat within the sinus tarsi.

Bones: Metatarsus primus varus alignment. Severe osteoarthritis of
the first and second TMT joints with complete joint space loss,
subchondral cystic changes and marginal osteophytes.
Mild-to-moderate arthropathy of the third through fifth TMT joints.
Mild hallux valgus alignment. No acute fracture. No dislocation. No
focal bone marrow edema or periostitis. Small plantar calcaneal
spur.

Other: Subcutaneous edema of the visualized lower leg. No
ulceration. No organized fluid collection.
IMPRESSION: 1. No acute osseous abnormality.
2. Severe osteoarthritis of the first and second TMT joints.
3. Mild-to-moderate arthropathy of the third through fifth TMT
joints.
4. Metatarsus primus varus and hallux valgus alignment.

## 2021-09-08 ENCOUNTER — Telehealth: Payer: Self-pay | Admitting: Orthopedic Surgery

## 2021-09-08 NOTE — Telephone Encounter (Signed)
Pt called requesting a call with MRI results. Pt states her boyfriend past away this morning and she has to drive to [place] to take care of his arrangements. Please call pt at [phone].

## 2021-09-08 NOTE — Telephone Encounter (Signed)
Patient called back and also wants to know what she can take for pain

## 2021-09-09 ENCOUNTER — Other Ambulatory Visit: Payer: Self-pay | Admitting: Orthopedic Surgery

## 2021-09-09 MED ORDER — TRAMADOL HCL 50 MG PO TABS: 50.0000 mg | ORAL_TABLET | Freq: Every evening | ORAL | 0 refills | Status: DC | PRN

## 2021-09-09 MED ORDER — MELOXICAM 7.5 MG PO TABS: 7.5000 mg | ORAL_TABLET | Freq: Every day | ORAL | 0 refills | Status: DC

## 2021-09-09 NOTE — Telephone Encounter (Signed)
I called -she has arthritis in the midfoot primarily in the tarsometatarsal joints.  No fracture.  She is going to delay her trip.  Going to call in Mobic seven-point 5 in the morning and tramadol to take at night for at least 2 to 3 weeks to hopefully help.

## 2021-09-18 NOTE — Progress Notes (Signed)
I called and we discussed the MRI scan report.

## 2021-09-26 ENCOUNTER — Telehealth: Payer: Self-pay | Admitting: Orthopedic Surgery

## 2021-09-26 NOTE — Telephone Encounter (Signed)
Patient called advised she has Covid-19 Virus and still tested positive as of yesterday. I read note from Dr. Marlou Sa to patient. The number to contact patient is [phone]

## 2021-09-26 NOTE — Telephone Encounter (Signed)
tyvm

## 2021-10-17 ENCOUNTER — Other Ambulatory Visit: Payer: Self-pay | Admitting: Internal Medicine

## 2021-10-17 ENCOUNTER — Other Ambulatory Visit: Payer: Self-pay

## 2021-10-17 ENCOUNTER — Ambulatory Visit
Admission: RE | Admit: 2021-10-17 | Discharge: 2021-10-17 | Disposition: A | Discharge status: Home / Self Care | Payer: Medicare Other | Source: Ambulatory Visit | Attending: Internal Medicine | Admitting: Internal Medicine

## 2021-10-17 DIAGNOSIS — R058 Other specified cough: Secondary | ICD-10-CM

## 2021-10-17 IMAGING — CR DG CHEST 2V
2 series · 2 of 2 positions shown · non-contrast
Comparison: Chest radiograph [DATE]

CLINICAL DATA: Cough, shortness of breath, chest pain for 1 week

EXAM:
CHEST - 2 VIEW

[w chest pa]
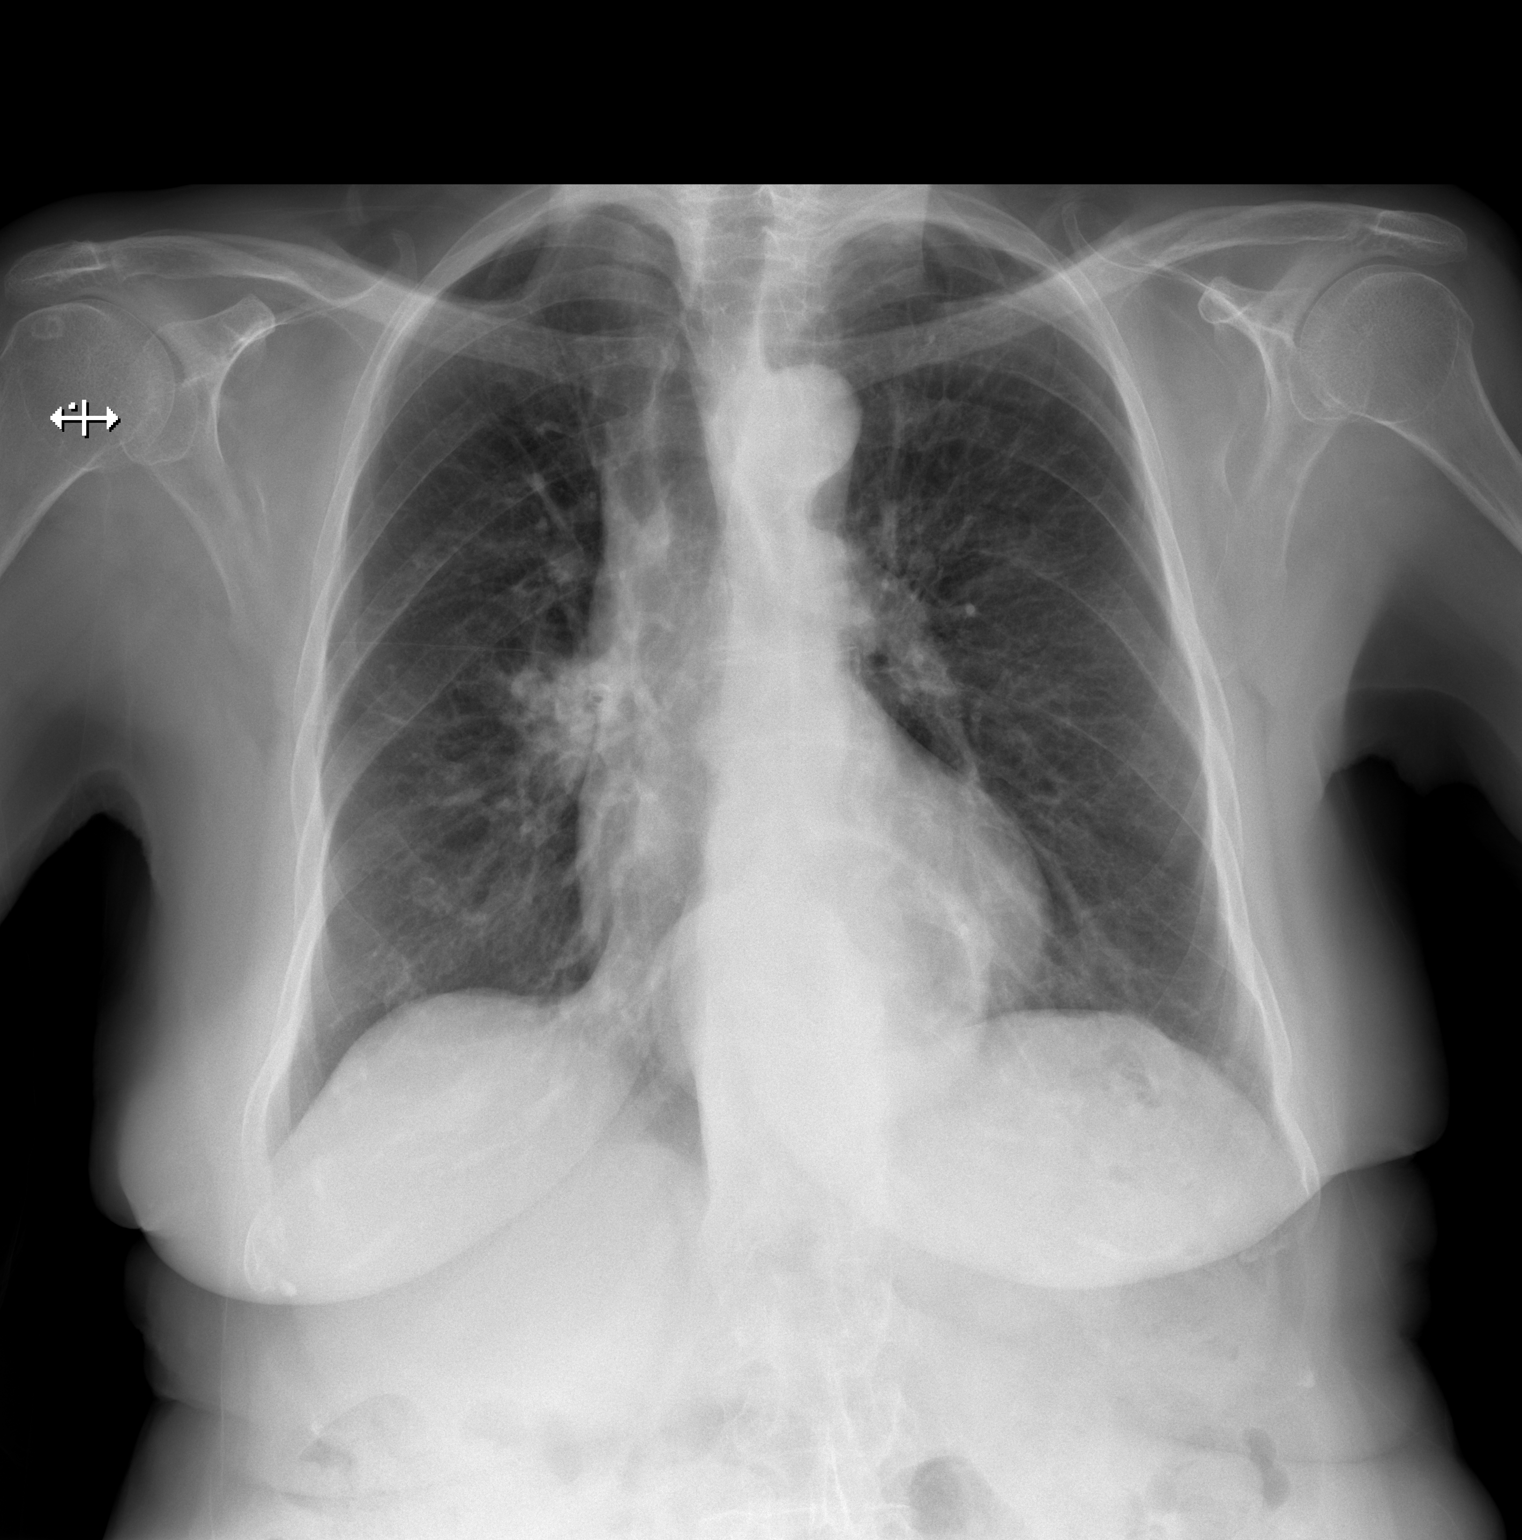

[w chest lat]
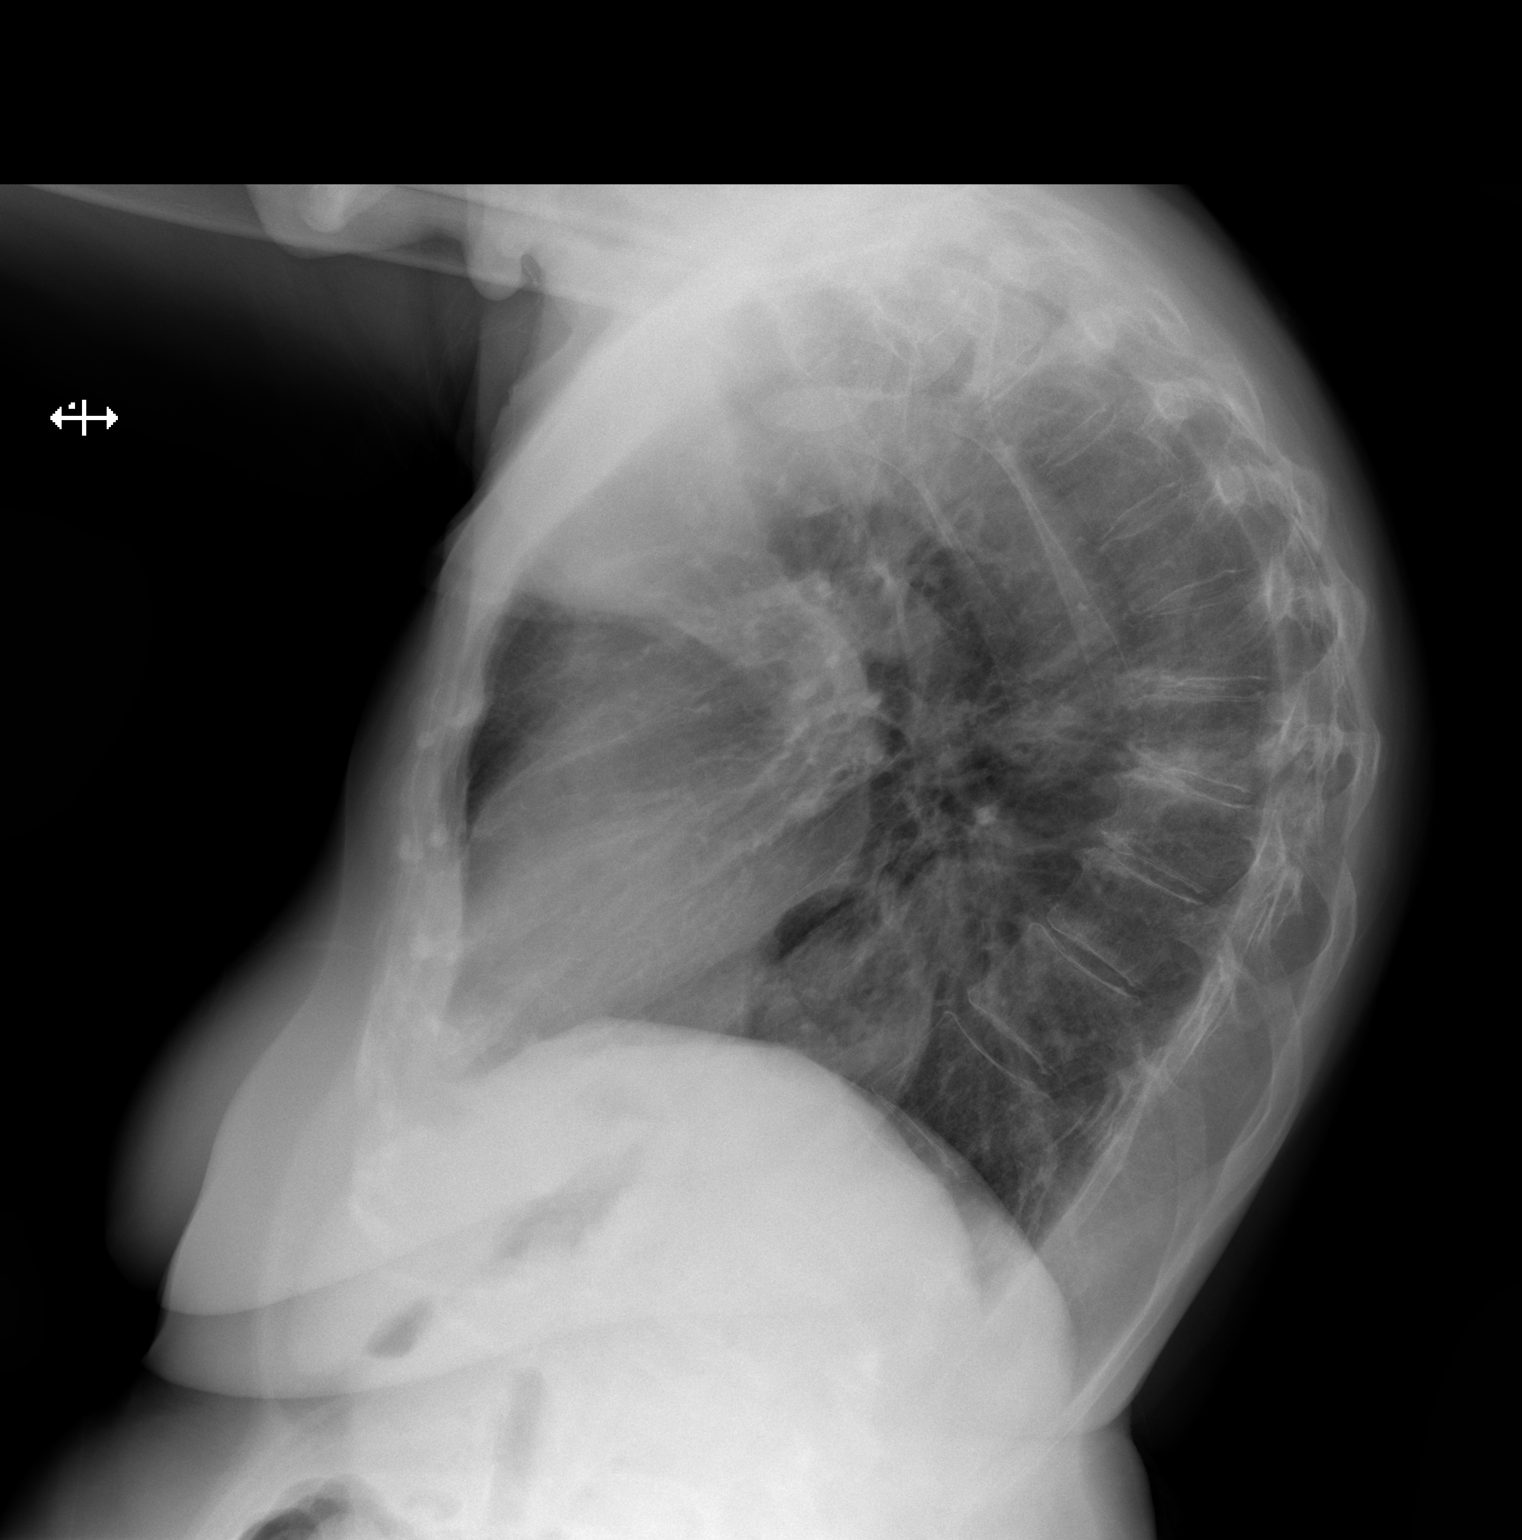

[2 of 2 positions shown; findings below may reference images not displayed]

FINDINGS: The cardiomediastinal silhouette is stable and within normal limits.

There is fullness of the right hilum which is increased compared to
the prior study from [19]. There is no focal consolidation or
pulmonary edema. There is no pleural effusion or pneumothorax. There
is a moderate size hiatal hernia, unchanged.

There is no acute osseous abnormality.
IMPRESSION: 1. Fullness of the right hilum which is new since [19]. Recommend CT
chest with contrast to exclude underlying mass lesion.
2. No focal consolidation or pleural effusion.
3. Moderate size hiatal hernia, unchanged.

## 2021-10-18 ENCOUNTER — Telehealth: Payer: Self-pay | Admitting: Orthopedic Surgery

## 2021-10-18 NOTE — Telephone Encounter (Signed)
IC discussed with patient--I advised did not see anything regarding contrast but could reach out to [facility].  ?

## 2021-10-18 NOTE — Telephone Encounter (Signed)
Pt called requesting a call back from [name] states she has medical questions. Pt states she is allergic to whatever they put in pt body before having and MRI. Pt states she need to name of contrast used so she can tell the MRI facility she need to make an appt for. Please call pt at [phone]. ?

## 2021-10-26 ENCOUNTER — Other Ambulatory Visit: Payer: Self-pay

## 2021-10-26 ENCOUNTER — Ambulatory Visit: Payer: Medicare Other | Admitting: Pulmonary Disease

## 2021-10-26 ENCOUNTER — Encounter: Payer: Self-pay | Admitting: Pulmonary Disease

## 2021-10-26 VITALS — BP 132/64 | HR 61 | Temp 97.9°F | Ht 66.0 in | Wt 180.4 lb

## 2021-10-26 DIAGNOSIS — R059 Cough, unspecified: Secondary | ICD-10-CM | POA: Diagnosis not present

## 2021-10-26 DIAGNOSIS — R9389 Abnormal findings on diagnostic imaging of other specified body structures: Secondary | ICD-10-CM

## 2021-10-26 LAB — CBC WITH DIFFERENTIAL/PLATELET
Basophils Absolute: 0 10*3/uL (ref 0.0–0.1)
Basophils Relative: 0.6 % (ref 0.0–3.0)
Eosinophils Absolute: 0.2 10*3/uL (ref 0.0–0.7)
Eosinophils Relative: 2.6 % (ref 0.0–5.0)
HCT: 36.2 % (ref 36.0–46.0)
Hemoglobin: 11.8 g/dL — ABNORMAL LOW (ref 12.0–15.0)
Lymphocytes Relative: 20 % (ref 12.0–46.0)
Lymphs Abs: 1.2 10*3/uL (ref 0.7–4.0)
MCHC: 32.5 g/dL (ref 30.0–36.0)
MCV: 85.6 fl (ref 78.0–100.0)
Monocytes Absolute: 0.5 10*3/uL (ref 0.1–1.0)
Monocytes Relative: 9.4 % (ref 3.0–12.0)
Neutro Abs: 4 10*3/uL (ref 1.4–7.7)
Neutrophils Relative %: 67.4 % (ref 43.0–77.0)
Platelets: 253 10*3/uL (ref 150.0–400.0)
RBC: 4.23 Mil/uL (ref 3.87–5.11)
RDW: 15.7 % — ABNORMAL HIGH (ref 11.5–15.5)
WBC: 5.9 10*3/uL (ref 4.0–10.5)

## 2021-10-26 NOTE — Patient Instructions (Signed)
I am glad you are stable with regard to your breathing ?We will check labs today including CBC differential, IgE ?Schedule high-res CT for evaluation of the lung ?Return to clinic in 2 to 4 weeks ?

## 2021-10-26 NOTE — Progress Notes (Signed)
? ?      ?[name]    [id number]    [birth year] ? ?Primary Care Physician:Moreira, Carloyn Manner, MD ? ?Referring Physician: Jilda Panda, MD ?[address] ?[place],  [place] [address] ? ?Chief complaint: Consult for cough ? ?HPI: ?76 year old with history of hypertension, asthma, hyperlipidemia, COPD ?Developed COVID infection in January 2023.  Complains of persistent cough for the past 2 months, nonproductive in nature.  Associated with dyspnea on exertion ? ?She has history of COPD but was never smoker but had been exposed to secondhand smoke.  Maintained on Symbicort. ? ?Pets: Cats ?Occupation: Used to work in administration ?Exposures: No exposures ?Smoking history: Never smoker.  Exposed to secondhand ?Travel history: No significant travel ?Relevant family history: No family history of lung disease ? ? ?Outpatient Encounter Medications as of 10/26/2021  ?Medication Sig  ? albuterol (VENTOLIN HFA) 108 (90 Base) MCG/ACT inhaler Inhale 2 puffs into the lungs every 6 (six) hours as needed for wheezing or shortness of breath.  ? amLODipine (NORVASC) 5 MG tablet Take 5 mg by mouth daily.  ? aspirin 81 MG tablet Take 81 mg by mouth daily.  ? atorvastatin (LIPITOR) 20 MG tablet Take 1 tablet by mouth at bedtime.  ? budesonide-formoterol (SYMBICORT) 160-4.5 MCG/ACT inhaler Inhale 2 puffs into the lungs daily.   ? calcium carbonate (OS-CAL) 600 MG TABS tablet Take 600 mg by mouth 2 (two) times daily with a meal.  ? Cholecalciferol (VITAMIN D-3) 1000 UNITS CAPS Take 1,000 Units by mouth daily.   ? doxycycline (VIBRAMYCIN) 100 MG capsule Take 100 mg by mouth 2 (two) times daily.  ? Cetirizine HCl 10 MG CAPS Take 1 capsule (10 mg total) by mouth daily for 5 days. (Patient not taking: Reported on 10/26/2021)  ? Coenzyme Q10 (COQ-10 PO) Take 1 tablet by mouth daily. (Patient not taking: Reported on 10/26/2021)  ? famotidine (PEPCID) 20 MG tablet Take 1 tablet (20 mg total) by mouth daily for 5 days.  ? gabapentin (NEURONTIN)  100 MG capsule Take 1 capsule (100 mg total) by mouth at bedtime. (Patient not taking: Reported on 10/26/2021)  ? lidocaine (XYLOCAINE) 5 % ointment Apply 1 application topically 4 (four) times daily as needed. (Patient not taking: Reported on 10/26/2021)  ? losartan (COZAAR) 100 MG tablet Take 100 mg by mouth daily.  (Patient not taking: Reported on 10/26/2021)  ? meloxicam (MOBIC) 7.5 MG tablet Take 1 tablet (7.5 mg total) by mouth daily. (Patient not taking: Reported on 10/26/2021)  ? Misc Natural Products (FOCUSED MIND PO) Take 1 tablet by mouth daily.  (Patient not taking: Reported on 10/26/2021)  ? polyethylene glycol (MIRALAX / GLYCOLAX) 17 g packet Take 17 g by mouth daily.  (Patient not taking: Reported on 10/26/2021)  ? senna-docusate (SENOKOT-S) 8.6-50 MG tablet Take 2 tablets by mouth at bedtime as needed for mild constipation or moderate constipation. (Patient not taking: Reported on 10/26/2021)  ? traMADol (ULTRAM) 50 MG tablet Take 1 tablet (50 mg total) by mouth at bedtime as needed. (Patient not taking: Reported on 10/26/2021)  ? Ubrogepant (UBRELVY) 50 MG TABS Take 50 mg by mouth as needed (may repeat once in 2 hours. no more than 2 pills in 24 h.). (Patient not taking: Reported on 10/26/2021)  ? Vitamins A & D (VITAMIN A & D) ointment Apply 1 application topically as needed for dry skin. (Patient not taking: Reported on 10/26/2021)  ? [DISCONTINUED] aspirin-acetaminophen-caffeine (EXCEDRIN MIGRAINE) 250-250-65 MG tablet Take 1 tablet by mouth  as needed for headache.  ? [DISCONTINUED] clobetasol ointment (TEMOVATE) 0.05 % Apply a small amount 2 x a week  ? [DISCONTINUED] diclofenac sodium (VOLTAREN) 1 % GEL Apply 2 g topically 4 (four) times daily.  ? [DISCONTINUED] Multiple Vitamins-Minerals (ZINC PO) Take 1 tablet by mouth daily.   ? [DISCONTINUED] traMADol (ULTRAM) 50 MG tablet Take 1 tablet (50 mg total) by mouth at bedtime as needed.  ? [DISCONTINUED] vitamin C (ASCORBIC ACID) 250 MG tablet Take 250 mg  by mouth daily. (Patient not taking: Reported on 10/26/2021)  ? ?No facility-administered encounter medications on file as of 10/26/2021.  ? ? ?Allergies as of 10/26/2021 - Review Complete 10/26/2021  ?Allergen Reaction Noted  ? Contrast media [iodinated contrast media] Hives 06/19/2002  ? Ether Other (See Comments) 02/12/2014  ? Iohexol  04/13/2006  ? ? ?Past Medical History:  ?Diagnosis Date  ? COPD (chronic obstructive pulmonary disease) ([facility])   ? Glaucoma   ? History of kidney disease   ? History of kidney disease 06/11/2019  ? History of recurrent UTIs   ? Hyperlipidemia   ? Hypertension   ? Lichen sclerosus January 2017  ? vulva/perianal region  ? Non-melanoma skin cancer 08/2015  ? right leg  ? Osteoporosis   ? Post-menopausal    ? Prediabetes   ? ? ?Past Surgical History:  ?Procedure Laterality Date  ? CLOSED REDUCTION ELBOW FRACTURE Right 12/12/2019  ? Procedure: CLOSED REDUCTION RIGHT ELBOW;  Surgeon: Shona Needles, MD;  Location: [facility];  Service: Orthopedics;  Laterality: Right;  ? EYE SURGERY Right   ? Stent placement  ? FOOT SURGERY Right 06/2013  ? right 2nd toe  ? HEMATOMA EVACUATION Left 2007  ? left calf - excision of hemangioma with thrombus  ? ? ?Family History  ?Problem Relation Age of Onset  ? Heart attack Father   ? Heart attack Brother   ? ? ?Social History  ? ?Socioeconomic History  ? Marital status: Widowed  ?  Spouse name: Not on file  ? Number of children: Not on file  ? Years of education: Not on file  ? Highest education level: Not on file  ?Occupational History  ? Not on file  ?Tobacco Use  ? Smoking status: Never  ? Smokeless tobacco: Never  ?Vaping Use  ? Vaping Use: Never used  ?Substance and Sexual Activity  ? Alcohol use: Not Currently  ? Drug use: No  ? Sexual activity: Not Currently  ?  Birth control/protection: Post-menopausal  ?Other Topics Concern  ? Not on file  ?Social History Narrative  ? Not on file  ? ?Social Determinants of Health  ? ?Financial Resource Strain: Not on file   ?Food Insecurity: Not on file  ?Transportation Needs: Not on file  ?Physical Activity: Not on file  ?Stress: Not on file  ?Social Connections: Not on file  ?Intimate Partner Violence: Not on file  ? ? ?Review of systems: ?Review of Systems  ?Constitutional: Negative for fever and chills.  ?HENT: Negative.   ?Eyes: Negative for blurred vision.  ?Respiratory: as per HPI  ?Cardiovascular: Negative for chest pain and palpitations.  ?Gastrointestinal: Negative for vomiting, diarrhea, blood per rectum. ?Genitourinary: Negative for dysuria, urgency, frequency and hematuria.  ?Musculoskeletal: Negative for myalgias, back pain and joint pain.  ?Skin: Negative for itching and rash.  ?Neurological: Negative for dizziness, tremors, focal weakness, seizures and loss of consciousness.  ?Endo/Heme/Allergies: Negative for environmental allergies.  ?Psychiatric/Behavioral: Negative for depression, suicidal ideas and hallucinations.  ?  All other systems reviewed and are negative. ? ?Physical Exam: ?Blood pressure 132/64, pulse 61, temperature 97.9 ?F (36.6 ?C), temperature source Oral, height '5\' 6"'$  (1.676 m), weight 180 lb 6.4 oz (81.8 kg), last menstrual period 08/14/1985, SpO2 97 %. ?Gen:      No acute distress ?HEENT:  EOMI, sclera anicteric ?Neck:     No masses; no thyromegaly ?Lungs:    Clear to auscultation bilaterally; normal respiratory effort ?CV:         Regular rate and rhythm; no murmurs ?Abd:      + bowel sounds; soft, non-tender; no palpable masses, no distension ?Ext:    No edema; adequate peripheral perfusion ?Skin:      Warm and dry; no rash ?Neuro: alert and oriented x 3 ?Psych: normal mood and affect ? ?Data Reviewed: ?Imaging: ?Chest x-ray 10/18/2021-fullness of the right hilum.  Moderate hiatal hernia ?I reviewed the images personally ? ?PFTs: ? ?Labs: ? ?Assessment:  ?Evaluation for cough ?Chest x-ray shows hilar fullness which is concerning.  She is a non-smoker with history of COPD in the chart and is maintained  on Symbicort ?Notable for recent COVID infection in January 2023 ? ?We will get CT chest for better evaluation.  Further follow-up and plan after review of imaging. ? ?Continue Symbicort for now.

## 2021-10-27 LAB — IGE: IgE (Immunoglobulin E), Serum: 114 kU/L (ref <=114)

## 2021-11-17 ENCOUNTER — Ambulatory Visit
Admission: RE | Admit: 2021-11-17 | Discharge: 2021-11-17 | Disposition: A | Discharge status: Home / Self Care | Payer: Medicare Other | Source: Ambulatory Visit | Attending: Pulmonary Disease | Admitting: Pulmonary Disease

## 2021-11-17 DIAGNOSIS — R9389 Abnormal findings on diagnostic imaging of other specified body structures: Secondary | ICD-10-CM

## 2021-11-17 IMAGING — CT CT CHEST HIGH RESOLUTION
1 of 5 series · 15 of 33 positions shown, 19 images · non-contrast
Comparison: Chest radiographs, [DATE]

CLINICAL DATA: Abnormal chest radiographs, history of COVID, right
hilar fullness



[Series 8: super d · axial · 0.73mm/px · z∈[-159,+120]mm · 15 of 522 slices shown, 19 images]
[im 29/522  mediastinal]
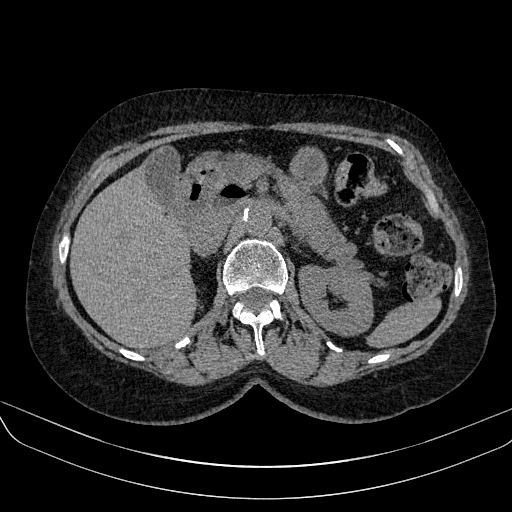
[im 29/522  lung]
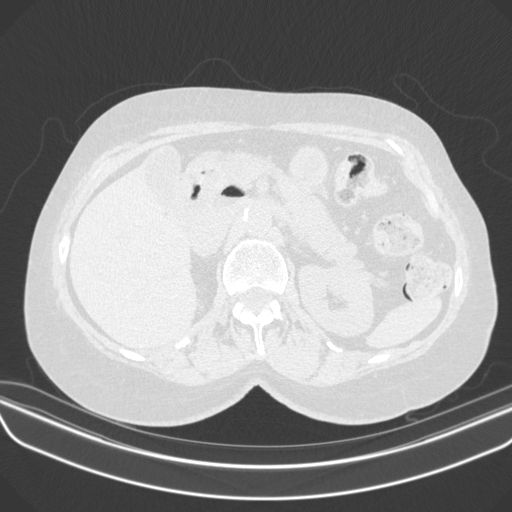
[im 58/522  lung]
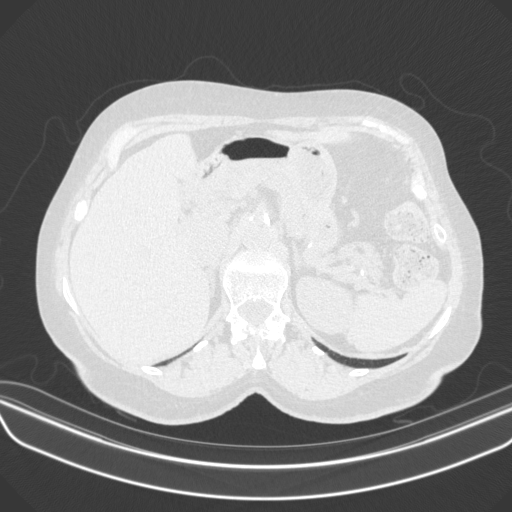
[im 116/522  lung]
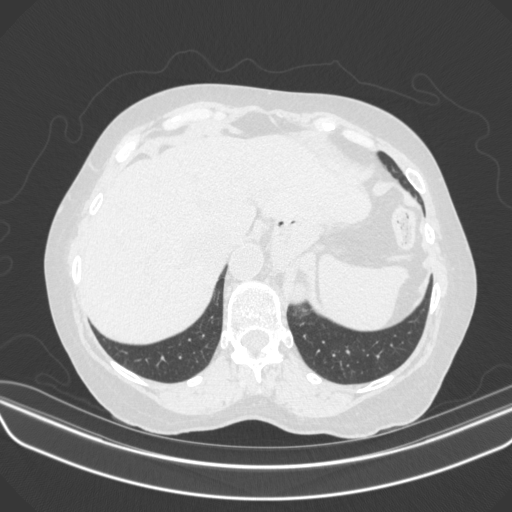
[im 145/522  lung]
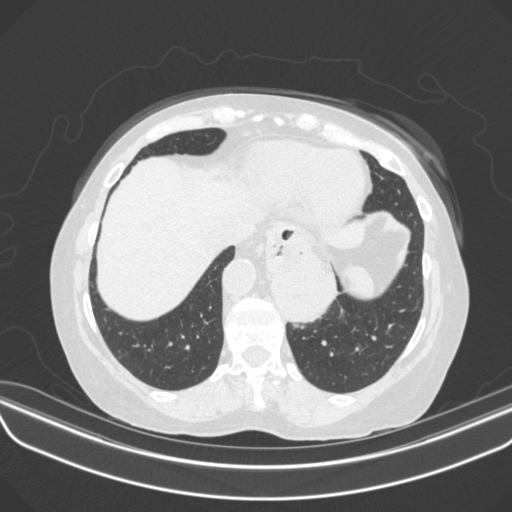
[im 174/522  mediastinal]
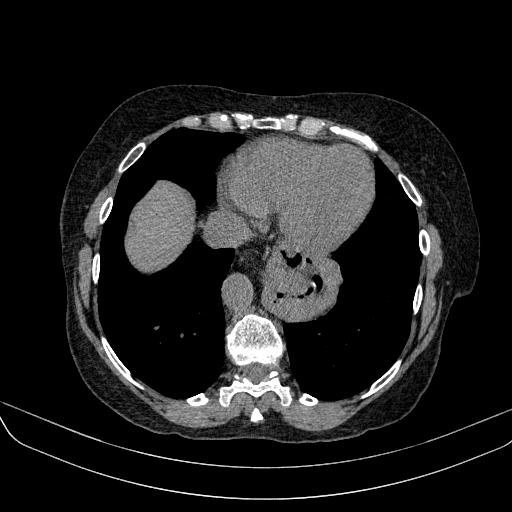
[im 174/522  lung]
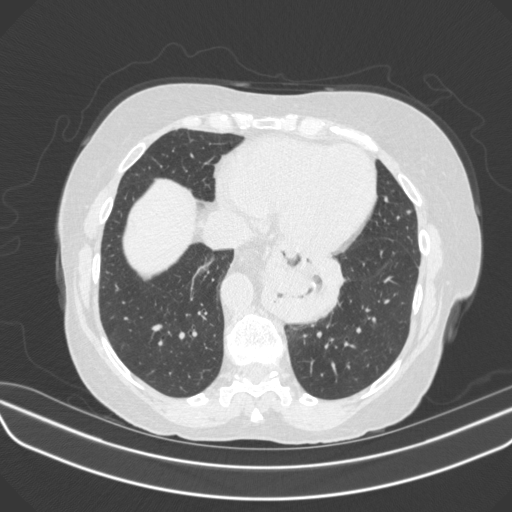
[im 203/522  lung]
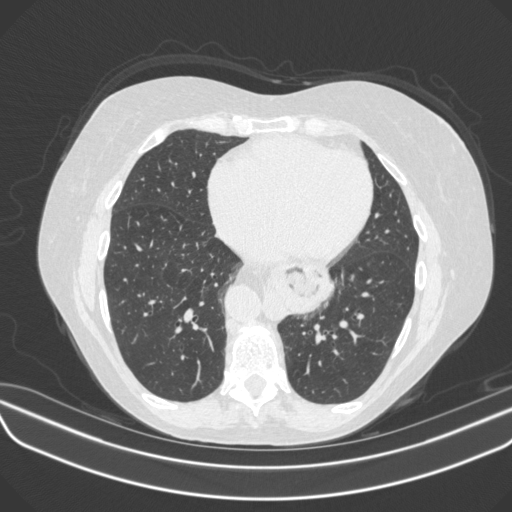
[im 232/522  lung]
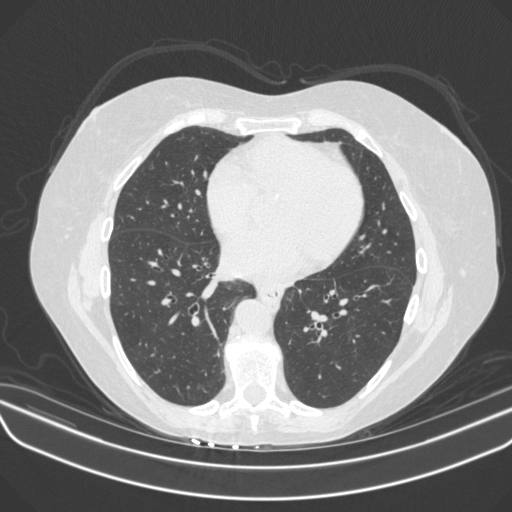
[im 261/522  lung]
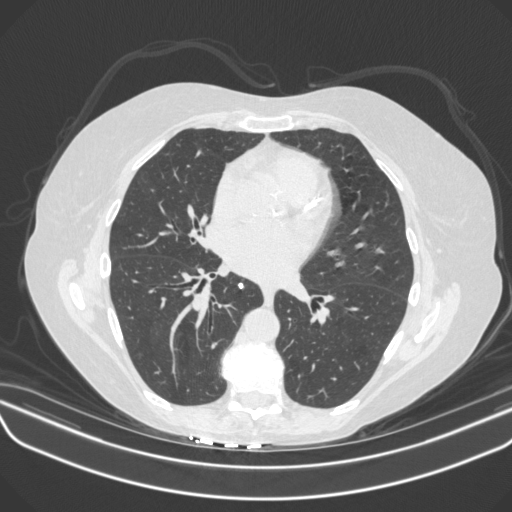
[im 290/522  mediastinal]
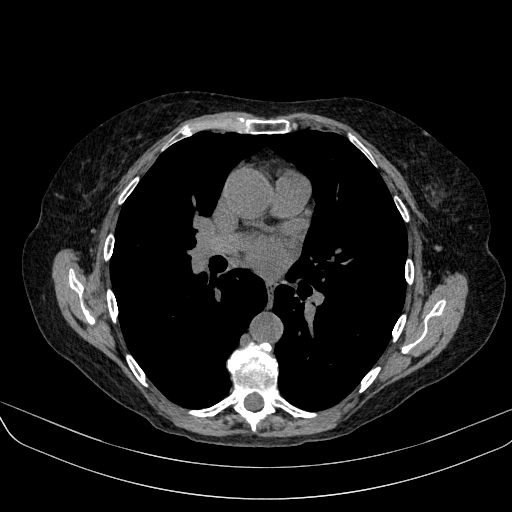
[im 290/522  lung]
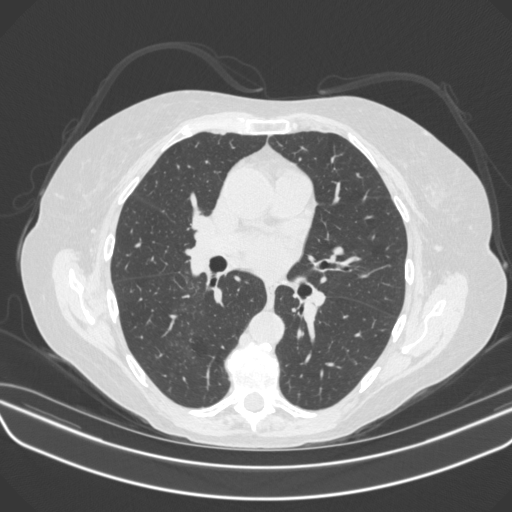
[im 319/522  lung]
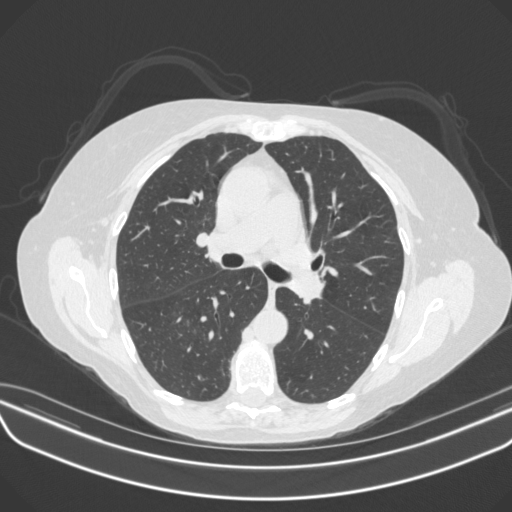
[im 348/522  lung]
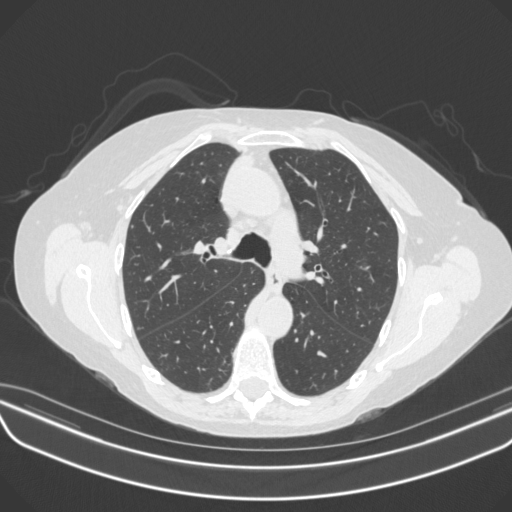
[im 377/522  lung]
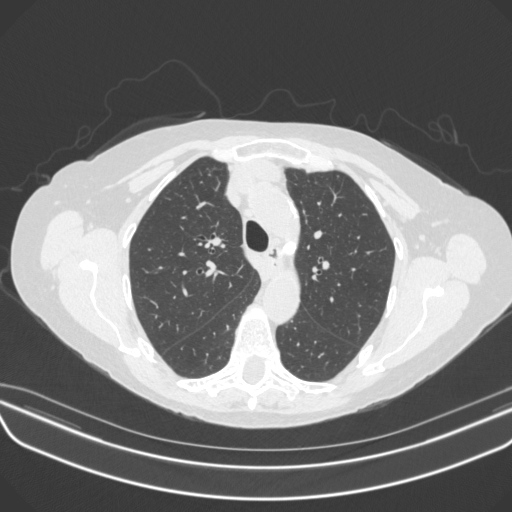
[im 435/522  mediastinal]
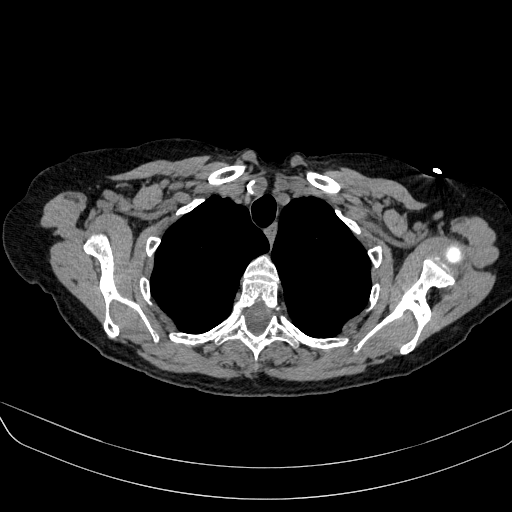
[im 435/522  lung]
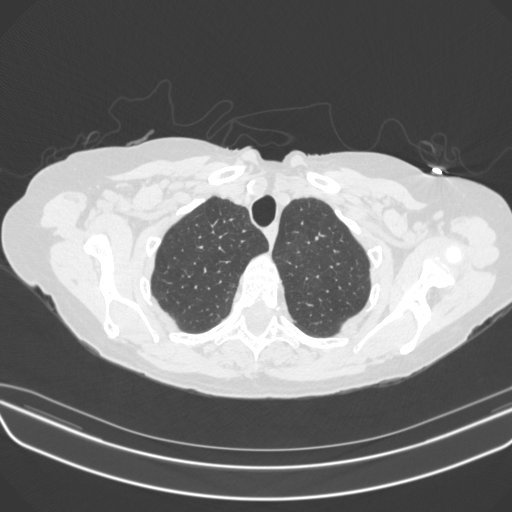
[im 464/522  lung]
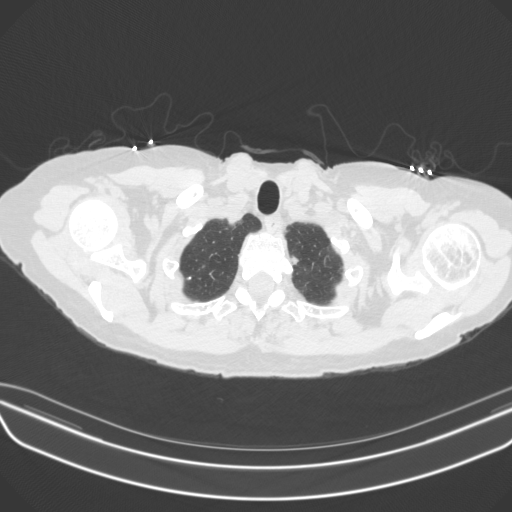
[im 493/522  lung]
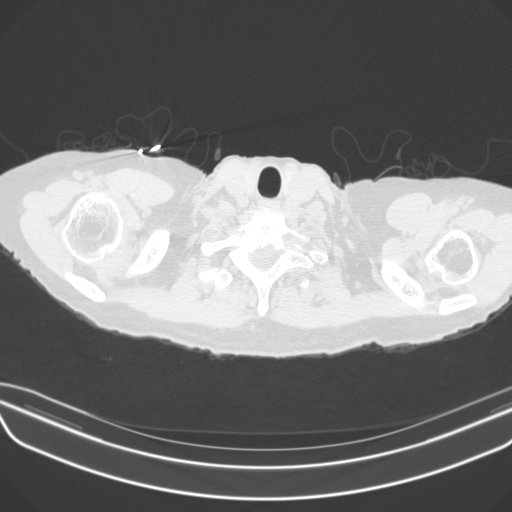

[15 of 33 positions shown; findings below may reference images not displayed]

FINDINGS: Cardiovascular: Aortic atherosclerosis. Normal heart size. Left and
right coronary artery calcifications. No pericardial effusion.

Mediastinum/Nodes: No enlarged mediastinal, hilar, or axillary lymph
nodes. Moderate hiatal hernia with intrathoracic position of the
gastric fundus. Thyroid gland, trachea, and esophagus demonstrate no
significant findings.

Lungs/Pleura: Lungs are clear. Lobular air trapping on expiratory
phase imaging. No pleural effusion or pneumothorax.

Upper Abdomen: No acute abnormality.

Musculoskeletal: No chest wall abnormality. No suspicious osseous
lesions identified. Exaggerated thoracic kyphosis.
IMPRESSION: 1. No noncontrast abnormality of the right hilum. No evidence of
mass or lymphadenopathy.

2. Lobular air trapping on expiratory phase imaging, suggesting
small airways disease.

3.  Hiatal hernia.

4.  Coronary artery disease.

Aortic Atherosclerosis ([8P]-[8P]).

## 2021-12-02 ENCOUNTER — Ambulatory Visit: Payer: Medicare Other | Admitting: Pulmonary Disease

## 2021-12-02 ENCOUNTER — Encounter: Payer: Self-pay | Admitting: Pulmonary Disease

## 2021-12-02 VITALS — BP 140/70 | HR 69 | Temp 98.2°F | Ht 66.0 in | Wt 177.0 lb

## 2021-12-02 DIAGNOSIS — R059 Cough, unspecified: Secondary | ICD-10-CM | POA: Diagnosis not present

## 2021-12-02 DIAGNOSIS — R9389 Abnormal findings on diagnostic imaging of other specified body structures: Secondary | ICD-10-CM

## 2021-12-02 MED ORDER — BUDESONIDE-FORMOTEROL FUMARATE 160-4.5 MCG/ACT IN AERO: 2.0000 | INHALATION_SPRAY | Freq: Every day | RESPIRATORY_TRACT | 5 refills | Status: AC

## 2021-12-02 NOTE — Addendum Note (Signed)
Addended by: Elton Sin on: 12/02/2021 12:09 PM ? ? Modules accepted: Orders ? ?

## 2021-12-02 NOTE — Patient Instructions (Signed)
I reviewed his CT scan which looks good with no abnormalities ?Continue the Symbicort.  We will call in renewal for it ?Schedule PFTs in 6 months.  Follow-up in clinic in 6 months after PFTs ?

## 2021-12-02 NOTE — Progress Notes (Signed)
? ?      ?[name]    [id number]    [birth year] ? ?Primary Care Physician:Moreira, Carloyn Manner, MD ? ?Referring Physician: Jilda Panda, MD ?[address] ?[place],  [place] [address] ? ?Chief complaint: Follow-up for mild persistent asthma, cough ? ?HPI: ?76 year old with history of hypertension, asthma, hyperlipidemia, COPD ?Developed COVID infection in January 2023.  Complains of persistent cough for the past 2 months, nonproductive in nature.  Associated with dyspnea on exertion ? ?She has history of COPD but was never smoker but had been exposed to secondhand smoke.  Maintained on Symbicort. ? ?Pets: Cats ?Occupation: Used to work in administration ?Exposures: No exposures ?Smoking history: Never smoker.  Exposed to secondhand ?Travel history: No significant travel ?Relevant family history: No family history of lung disease ? ?Interim history: ?She is doing well with regard to her breathing.  Continues on Symbicort ?Cough is improved ?Here for review of CT scan ? ? ?Outpatient Encounter Medications as of 12/02/2021  ?Medication Sig  ? albuterol (VENTOLIN HFA) 108 (90 Base) MCG/ACT inhaler Inhale 2 puffs into the lungs every 6 (six) hours as needed for wheezing or shortness of breath.  ? amLODipine (NORVASC) 5 MG tablet Take 5 mg by mouth daily.  ? aspirin 81 MG tablet Take 81 mg by mouth daily.  ? atorvastatin (LIPITOR) 20 MG tablet Take 1 tablet by mouth at bedtime.  ? budesonide-formoterol (SYMBICORT) 160-4.5 MCG/ACT inhaler Inhale 2 puffs into the lungs daily.   ? calcium carbonate (OS-CAL) 600 MG TABS tablet Take 600 mg by mouth 2 (two) times daily with a meal.  ? Cholecalciferol (VITAMIN D-3) 1000 UNITS CAPS Take 1,000 Units by mouth daily.   ? gabapentin (NEURONTIN) 100 MG capsule Take 1 capsule (100 mg total) by mouth at bedtime.  ? lidocaine (XYLOCAINE) 5 % ointment Apply 1 application topically 4 (four) times daily as needed.  ? losartan (COZAAR) 100 MG tablet Take 100 mg by mouth daily.  ?  meloxicam (MOBIC) 7.5 MG tablet Take 1 tablet (7.5 mg total) by mouth daily.  ? Misc Natural Products (FOCUSED MIND PO) Take 1 tablet by mouth daily.  ? polyethylene glycol (MIRALAX / GLYCOLAX) 17 g packet Take 17 g by mouth daily.  ? senna-docusate (SENOKOT-S) 8.6-50 MG tablet Take 2 tablets by mouth at bedtime as needed for mild constipation or moderate constipation.  ? Ubrogepant (UBRELVY) 50 MG TABS Take 50 mg by mouth as needed (may repeat once in 2 hours. no more than 2 pills in 24 h.).  ? Vitamins A & D (VITAMIN A & D) ointment Apply 1 application. topically as needed for dry skin.  ? [DISCONTINUED] doxycycline (VIBRAMYCIN) 100 MG capsule Take 100 mg by mouth 2 (two) times daily.  ? [DISCONTINUED] traMADol (ULTRAM) 50 MG tablet Take 1 tablet (50 mg total) by mouth at bedtime as needed.  ? Cetirizine HCl 10 MG CAPS Take 1 capsule (10 mg total) by mouth daily for 5 days. (Patient not taking: Reported on 10/26/2021)  ? famotidine (PEPCID) 20 MG tablet Take 1 tablet (20 mg total) by mouth daily for 5 days.  ? [DISCONTINUED] Coenzyme Q10 (COQ-10 PO) Take 1 tablet by mouth daily. (Patient not taking: Reported on 12/02/2021)  ? ?No facility-administered encounter medications on file as of 12/02/2021.  ? ? ?Physical Exam: ?Gen:      No acute distress ?HEENT:  EOMI, sclera anicteric ?Neck:     No masses; no thyromegaly ?Lungs:    Clear to auscultation  bilaterally; normal respiratory effort ?CV:         Regular rate and rhythm; no murmurs ?Abd:      + bowel sounds; soft, non-tender; no palpable masses, no distension ?Ext:    No edema; adequate peripheral perfusion ?Skin:      Warm and dry; no rash ?Neuro: alert and oriented x 3 ?Psych: normal mood and affect  ? ?Data Reviewed: ?Imaging: ?Chest x-ray 10/18/2021- fullness of the right hilum.  Moderate hiatal hernia ? ?HRCT 11/17/2021-no abnormality to correspond to chest x-ray findings.  At trapping. ?I reviewed the images personally ? ?PFTs: ? ?Labs: ?CBC 10/26/2021- WBC 5.9,  eos 2.6%, absolute eosinophil count 153 ?IgE 10/26/2021- 114 ? ?Assessment:  ?Asthma, cough ?Notable for recent COVID infection in January 2023 ?Chest x-ray shows hilar fullness but CT looks normal with no corresponding hilar abnormality or interstitial lung disease ? ?She has a diagnosis of asthma and is on Symbicort.  We will continue it for now as it seems to be helping. ?Schedule PFTs and follow-up in 6 months ? ?Plan/Recommendations: ?PFTs in 6 months ?Continue Symbicort ? ?Marshell Garfinkel MD ?[facility] ?12/02/2021, 11:47 AM ? ?CC: Jilda Panda, MD ? ?  ?

## 2021-12-07 ENCOUNTER — Ambulatory Visit (INDEPENDENT_AMBULATORY_CARE_PROVIDER_SITE_OTHER): Payer: Medicare Other

## 2021-12-07 ENCOUNTER — Ambulatory Visit: Payer: Self-pay

## 2021-12-07 ENCOUNTER — Ambulatory Visit: Payer: Medicare Other | Admitting: Surgical

## 2021-12-07 DIAGNOSIS — M170 Bilateral primary osteoarthritis of knee: Secondary | ICD-10-CM

## 2021-12-07 DIAGNOSIS — M1711 Unilateral primary osteoarthritis, right knee: Secondary | ICD-10-CM

## 2021-12-07 DIAGNOSIS — G8929 Other chronic pain: Secondary | ICD-10-CM

## 2021-12-07 DIAGNOSIS — M19079 Primary osteoarthritis, unspecified ankle and foot: Secondary | ICD-10-CM

## 2021-12-07 DIAGNOSIS — M25562 Pain in left knee: Secondary | ICD-10-CM | POA: Diagnosis not present

## 2021-12-07 DIAGNOSIS — M1712 Unilateral primary osteoarthritis, left knee: Secondary | ICD-10-CM

## 2021-12-07 DIAGNOSIS — M19072 Primary osteoarthritis, left ankle and foot: Secondary | ICD-10-CM | POA: Diagnosis not present

## 2021-12-07 DIAGNOSIS — M25561 Pain in right knee: Secondary | ICD-10-CM

## 2021-12-07 DIAGNOSIS — M79672 Pain in left foot: Secondary | ICD-10-CM

## 2021-12-08 ENCOUNTER — Encounter: Payer: Self-pay | Admitting: Surgical

## 2021-12-08 MED ORDER — BUPIVACAINE HCL 0.25 % IJ SOLN
1.0000 mL | INTRAMUSCULAR | Status: AC | PRN
  Administered 2021-12-07: 1 mL via INTRA_ARTICULAR

## 2021-12-08 MED ORDER — BUPIVACAINE HCL 0.25 % IJ SOLN
4.0000 mL | INTRAMUSCULAR | Status: AC | PRN
  Administered 2021-12-07: 4 mL via INTRA_ARTICULAR

## 2021-12-08 MED ORDER — METHYLPREDNISOLONE ACETATE 40 MG/ML IJ SUSP
13.0000 mg | INTRAMUSCULAR | Status: AC | PRN
  Administered 2021-12-07: 13 mg via INTRA_ARTICULAR

## 2021-12-08 MED ORDER — LIDOCAINE HCL 1 % IJ SOLN
5.0000 mL | INTRAMUSCULAR | Status: AC | PRN
  Administered 2021-12-07: 5 mL

## 2021-12-08 MED ORDER — METHYLPREDNISOLONE ACETATE 40 MG/ML IJ SUSP
40.0000 mg | INTRAMUSCULAR | Status: AC | PRN
  Administered 2021-12-07: 40 mg via INTRA_ARTICULAR

## 2021-12-08 NOTE — Progress Notes (Signed)
? ?Office Visit Note ?  ?Patient: [name]           ?Date of Birth: 1946/02/15           ?MRN: [id number] ?Visit Date: 12/07/2021 ?Requested by: Jilda Panda, MD ?[address] ?[place],  [place] [address] ?PCP: Jilda Panda, MD ? ?Subjective: ?Chief Complaint  ?Patient presents with  ? Left Knee - Pain  ? Right Knee - Pain  ? Left Foot - Pain  ? ? ?HPI: [name] is a 76 y.o. female who presents to the office complaining of bilateral knee pain and left foot pain.  Patient states she has had flaring up of her knee pain.  She has history of bilateral knee osteoarthritis.  She wears a brace on her right knee.  Both knees feel weak like they want to give out on her but she has no frank instability.  No locking symptoms.  No new falls or injuries.  Denies any groin pain in either leg.  Last injection was in her right knee in October 2022. ? ?She also complains of continued left foot pain that she localizes to the midfoot.  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  Her left midfoot pain bothers her more than her knee pain.  Marland Kitchen   ?             ?ROS: All systems reviewed are negative as they relate to the chief complaint within the history of present illness.  Patient denies fevers or chills. ? ?Assessment & Plan: ?Visit Diagnoses:  ?1. Chronic pain of both knees   ?2. Pain in left foot   ? ? ?Plan: Patient is a 76 year old female who presents for evaluation of bilateral knee pain and left foot pain.  She has worse pain in her right knee and left foot.  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  Plan is right knee injection with left foot injection.  Right knee was injected and aspirated of about 25 cc of nonpurulent synovial fluid.  She tolerated this procedure  well.  Under ultrasound guidance, left foot injection was delivered into the first TMT joint with only partial amount of the injection entering the TMT joint with some of the injection residing in the subcutaneous tissue superficial to the joint.  Patient did have excellent relief of symptoms during the anesthetic portion of the procedure.  She walked with less of a limp after procedure was completed.  She will follow-up with the office as needed if she would like to try repeat injection in the future or return for left knee injection. ? ?Follow-Up Instructions: No follow-ups on file.  ? ?Orders:  ?Orders Placed This Encounter  ?Procedures  ? XR Knee 1-2 Views Right  ? XR Knee 1-2 Views Left  ? US Guided Needle Placement - No Linked Charges  ? ?No orders of the defined types were placed in this encounter. ? ? ? ? Procedures: ?Large Joint Inj: R knee on 12/07/2021 8:00 AM ?Indications: diagnostic evaluation, joint swelling and pain ?Details: 18 G 1.5 in needle, superolateral approach ? ?Arthrogram: No ? ?Medications: 5 mL lidocaine 1 %; 40 mg methylPREDNISolone acetate 40 MG/ML; 4 mL bupivacaine 0.25 % ?Aspirate: 25 mL ?Outcome: tolerated well, no immediate complications ?  Procedure, treatment alternatives, risks and benefits explained, specific risks discussed. Consent was given by the patient. Immediately prior to procedure a time out was called to verify the correct patient, procedure, equipment, support staff and site/side marked as required. Patient was prepped and draped in the usual sterile fashion.  ? ? ?Small Joint Inj on 12/07/2021 8:00 AM ?Indications: pain and joint swelling ?Details: 25 G needle, ultrasound-guided medial approach ?Medications: 5 mL lidocaine 1 %; 1 mL bupivacaine 0.25 %; 13 mg methylPREDNISolone acetate 40 MG/ML ? ?First TMT joint ?Procedure, treatment alternatives, risks and benefits explained, specific risks discussed. Consent was given by the patient. Immediately prior to procedure a  time out was called to verify the correct patient, procedure, equipment, support staff and site/side marked as required. Patient was prepped and draped in the usual sterile fashion.  ? ? ? ? ?Clinical Data: ?No additional findings. ? ?Objective: ?Vital Signs: LMP 08/14/1985 (Approximate)  ? ?Physical Exam:  ?Constitutional: Patient appears well-developed ?HEENT:  ?Head: Normocephalic ?Eyes:EOM are normal ?Neck: Normal range of motion ?Cardiovascular: Normal rate ?Pulmonary/chest: Effort normal ?Neurologic: Patient is alert ?Skin: Skin is warm ?Psychiatric: Patient has normal mood and affect ? ?Ortho Exam: Ortho exam demonstrates bilateral knees with no cellulitis or skin changes noted.  Able to perform straight leg raise without extensor lag.  Tenderness over the medial joint lines of both knees with tenderness over the lateral joint line of the right knee.  No calf tenderness.  Negative Homans' sign.  No pain with hip range of motion.  Positive effusion in the right knee.  Negative effusion in the left knee.  3 degrees extension of the right knee with 115 degrees of knee flexion.  0 degrees extension of the left knee with 120 degrees of knee flexion. ? ?Left foot with no cellulitis or skin changes noted.  Patient ambulates with slight antalgia.  She has moderate to severe tenderness over the midfoot, primarily on the medial side of the foot.  Intact ankle dorsiflexion, plantarflexion, inversion, eversion. ? ?Specialty Comments:  ?No specialty comments available. ? ?Imaging: ?No results found. ? ? ?PMFS History: ?Patient Active Problem List  ? Diagnosis Date Noted  ? Vaso vagal episode 12/12/2019  ? Closed posterior dislocation of right elbow 12/12/2019  ? Essential hypertension 12/12/2019  ? History of kidney disease 06/11/2019  ? Prediabetes   ? Status post foot surgery 06/27/2013  ? Other hammer toe (acquired) 06/10/2013  ? Pain in foot 06/10/2013  ? Onychomycosis 06/10/2013  ? ?Past Medical History:  ?Diagnosis  Date  ? COPD (chronic obstructive pulmonary disease) ([place])   ? Glaucoma   ? History of kidney disease   ? History of kidney disease 06/11/2019  ? History of recurrent UTIs   ? Hyperlipidemia   ? Hypertension   ? Lichen sclerosus January 2017  ? vulva/perianal region  ? Non-melanoma skin cancer 08/2015  ? right leg  ? Osteoporosis   ? Post-menopausal    ? Prediabetes   ?  ?Family History  ?Problem Relation Age of Onset  ? Heart attack Father   ? Heart attack Brother   ?  ?Past Surgical History:  ?Procedure Laterality Date  ? CLOSED REDUCTION ELBOW FRACTURE Right 12/12/2019  ? Procedure: CLOSED REDUCTION RIGHT ELBOW;  Surgeon: Shona Needles, MD;  Location: [facility];  Service: Orthopedics;  Laterality: Right;  ? EYE SURGERY Right   ? Stent placement  ? FOOT SURGERY Right 06/2013  ? right 2nd toe  ? HEMATOMA EVACUATION  Left 2007  ? left calf - excision of hemangioma with thrombus  ? ?Social History  ? ?Occupational History  ? Not on file  ?Tobacco Use  ? Smoking status: Never  ? Smokeless tobacco: Never  ?Vaping Use  ? Vaping Use: Never used  ?Substance and Sexual Activity  ? Alcohol use: Not Currently  ? Drug use: No  ? Sexual activity: Not Currently  ?  Birth control/protection: Post-menopausal  ? ? ? ? ?  ?

## 2022-01-02 ENCOUNTER — Encounter: Payer: Self-pay | Admitting: Cardiology

## 2022-01-02 ENCOUNTER — Ambulatory Visit: Payer: Medicare Other | Admitting: Cardiology

## 2022-01-02 VITALS — BP 142/84 | HR 57 | Temp 98.3°F | Resp 17 | Ht 66.0 in | Wt 177.6 lb

## 2022-01-02 DIAGNOSIS — I2510 Atherosclerotic heart disease of native coronary artery without angina pectoris: Secondary | ICD-10-CM

## 2022-01-02 DIAGNOSIS — I10 Essential (primary) hypertension: Secondary | ICD-10-CM

## 2022-01-02 DIAGNOSIS — E7800 Pure hypercholesterolemia, unspecified: Secondary | ICD-10-CM

## 2022-01-02 DIAGNOSIS — I350 Nonrheumatic aortic (valve) stenosis: Secondary | ICD-10-CM

## 2022-01-02 MED ORDER — OLMESARTAN MEDOXOMIL-HCTZ 40-25 MG PO TABS: 1.0000 | ORAL_TABLET | ORAL | 2 refills | Status: DC

## 2022-01-02 NOTE — Progress Notes (Signed)
Primary Physician/Referring:  Jilda Panda, MD  Patient ID: [name], female    DOB: August 25, 1945, 76 y.o.   MRN: [id number]  Chief Complaint  Patient presents with   New Patient (Initial Visit)   Coronary Artery Disease   HPI:    [name]  is a 76 y.o.  fairly active Caucasian female patient with longstanding hypertension, hyperlipidemia, referred to me for evaluation of coronary calcium noted on the CT scan of the chest.  Patient is very active, takes care of her 1 acre lot including moving, she just put up a fence around her vegetable band and has not had any chest pain or dyspnea.    Past Medical History:  Diagnosis Date   COPD (chronic obstructive pulmonary disease) ([place])    Glaucoma    History of kidney disease    History of kidney disease 06/11/2019   History of recurrent UTIs    Hyperlipidemia    Hypertension    Lichen sclerosus January 2017   vulva/perianal region   Non-melanoma skin cancer 08/2015   right leg   Osteoporosis    Post-menopausal     Prediabetes    Past Surgical History:  Procedure Laterality Date   CLOSED REDUCTION ELBOW FRACTURE Right 12/12/2019   Procedure: CLOSED REDUCTION RIGHT ELBOW;  Surgeon: Shona Needles, MD;  Location: [facility];  Service: Orthopedics;  Laterality: Right;   EYE SURGERY Right    Stent placement   FOOT SURGERY Right 06/2013   right 2nd toe   HEMATOMA EVACUATION Left 2007   left calf - excision of hemangioma with thrombus   Family History  Problem Relation Age of Onset   Lung cancer Mother 57   Heart attack Father 86       4 heart attacks   Heart attack Brother 42    Social History   Tobacco Use   Smoking status: Never   Smokeless tobacco: Never  Substance Use Topics   Alcohol use: Not Currently   Marital Status: Widowed  ROS  Review of Systems  Cardiovascular:  Positive for leg swelling (chronic). Negative for chest pain and dyspnea on exertion.  Objective  Blood pressure (!) 142/84, pulse  (!) 57, temperature 98.3 F (36.8 C), temperature source Temporal, resp. rate 17, height 5' 6"  (1.676 m), weight 177 lb 9.6 oz (80.6 kg), last menstrual period 08/14/1985, SpO2 96 %. Body mass index is 28.67 kg/m.     01/02/2022   11:14 AM 12/02/2021   11:36 AM 10/26/2021   10:04 AM  Vitals with BMI  Height 5' 6"  5' 6"  5' 6"   Weight 177 lbs 10 oz 177 lbs 180 lbs 6 oz  BMI 28.68 40.98 11.91  Systolic 478 295 621  Diastolic 84 70 64  Pulse 57 69 61    Physical Exam Neck:     Vascular: No carotid bruit or JVD.  Cardiovascular:     Rate and Rhythm: Normal rate and regular rhythm.     Pulses: Intact distal pulses.     Heart sounds: Murmur heard.  Midsystolic murmur is present with a grade of 2/6 at the upper right sternal border radiating to the apex.    No gallop.  Pulmonary:     Effort: Pulmonary effort is normal.     Breath sounds: Normal breath sounds.  Abdominal:     General: Bowel sounds are normal.     Palpations: Abdomen is soft.  Musculoskeletal:     Right lower leg: Edema (  2+ pitting) present.     Left lower leg: Edema (2+ pitting) present.    Medications and allergies   Allergies  Allergen Reactions   Contrast Media [Iodinated Contrast Media] Hives   Ether Other (See Comments)    hallucinations   Iohexol      Code: HIVES, Desc: pt had iv contrast for the first time today. doing a test dose w/ an miroi, 20cc contrast, pt immediately broke out in hives, itching, swelling of lips and tongue.  Check w/ rad concerning recommendations.50 mg of benedryl given po per dr Tery Sanfilippo., Onset Date: [id number]      Medication list after today's encounter   Current Outpatient Medications:    albuterol (VENTOLIN HFA) 108 (90 Base) MCG/ACT inhaler, Inhale 2 puffs into the lungs every 6 (six) hours as needed for wheezing or shortness of breath., Disp: , Rfl:    amLODipine (NORVASC) 5 MG tablet, Take 5 mg by mouth daily. Hold leg edema trial to see if leg swelling imporves 01/02/22,  Disp: , Rfl:    aspirin 81 MG tablet, Take 81 mg by mouth daily., Disp: , Rfl:    atorvastatin (LIPITOR) 20 MG tablet, Take 1 tablet by mouth at bedtime., Disp: , Rfl: 11   budesonide-formoterol (SYMBICORT) 160-4.5 MCG/ACT inhaler, Inhale 2 puffs into the lungs daily., Disp: 10.2 g, Rfl: 5   calcium carbonate (OS-CAL) 600 MG TABS tablet, Take 600 mg by mouth 2 (two) times daily with a meal., Disp: , Rfl:    Cholecalciferol (VITAMIN D-3) 1000 UNITS CAPS, Take 1,000 Units by mouth daily. , Disp: , Rfl:    famotidine (PEPCID) 20 MG tablet, Take 1 tablet by mouth daily., Disp: , Rfl:    furosemide (LASIX) 20 MG tablet, Take 20 mg by mouth daily., Disp: , Rfl:    lidocaine (XYLOCAINE) 5 % ointment, Apply 1 application topically 4 (four) times daily as needed., Disp: 30 g, Rfl: 1   Misc Natural Products (FOCUSED MIND PO), Take 1 tablet by mouth daily., Disp: , Rfl:    Multiple Vitamin (MULTIVITAMIN WITH MINERALS) TABS tablet, Take 1 tablet by mouth daily., Disp: , Rfl:    NON FORMULARY, Take 2 tablets by mouth daily. MOVE FREE ADVANCED, Disp: , Rfl:    olmesartan-hydrochlorothiazide (BENICAR HCT) 40-25 MG tablet, Take 1 tablet by mouth every morning., Disp: 30 tablet, Rfl: 2   polyethylene glycol (MIRALAX / GLYCOLAX) 17 g packet, Take 17 g by mouth daily., Disp: , Rfl:    vitamin B-12 (CYANOCOBALAMIN) 500 MCG tablet, Take 500 mcg by mouth daily., Disp: , Rfl:    vitamin C (ASCORBIC ACID) 500 MG tablet, Take 500 mg by mouth daily., Disp: , Rfl:    zinc gluconate 50 MG tablet, Take 50 mg by mouth daily., Disp: , Rfl:   Laboratory examination:       Latest Ref Rng & Units 10/26/2021   10:40 AM 12/13/2019    2:01 AM 12/11/2019   10:11 PM  CBC  WBC 4.0 - 10.5 K/uL 5.9   3.7   4.0    Hemoglobin 12.0 - 15.0 g/dL 11.8   10.3   11.1    Hematocrit 36.0 - 46.0 % 36.2   33.2   36.0    Platelets 150.0 - 400.0 K/uL 253.0   142   186     External labs:   Labs 12/09/2021:  Total cholesterol 08/14/1928,  triglycerides 33, HDL 73, LDL 48.  Non-HDL cholesterol 57.  BUN 19, creatinine 0.80,  EGFR 76 mm, potassium 4.2, LFTs normal.  Hb 12.6/HCT 39.2, platelets 208, normal indicis.  Radiology:   High-resolution CT scan of the chest 11/17/2021: Cardiovascular: Aortic atherosclerosis. Normal heart size. Left and right coronary artery calcifications. No pericardial effusion. Lobular air trapping on expiratory phase imaging, suggest small airway disease. Hiatal hernia.  Cardiac Studies:   Echocardiogram 12/12/2019:  1. Left ventricular ejection fraction, by estimation, is 60 to 65%. The left ventricle has normal function. The left ventricle has no regional  wall motion abnormalities. There is moderate left ventricular hypertrophy. Left ventricular diastolic parameters are consistent with age-related delayed relaxation (normal).   2. Right ventricular systolic function is normal. The right ventricular size is normal.   3. The mitral valve is normal in structure. Trivial mitral valve  regurgitation. No evidence of mitral stenosis.   4. The aortic valve is tricuspid. Aortic valve regurgitation is not visualized. Mild to moderate aortic valve sclerosis/calcification is present, without any evidence of aortic stenosis.   EKG:   EKG 01/02/2022: Normal sinus rhythm with rate of 57 bpm, leftward axis.  LVH.  No evidence of ischemia.  Early repolarization.  No significant change from 12/08/2021. No change in EKG from 12/12/2019.  Assessment     ICD-10-CM   1. Coronary artery calcification seen on CAT scan  I25.10 EKG 12-Lead    PCV ECHOCARDIOGRAM COMPLETE    2. Nonrheumatic aortic valve stenosis  I35.0 PCV ECHOCARDIOGRAM COMPLETE    3. Primary hypertension  I10 olmesartan-hydrochlorothiazide (BENICAR HCT) 40-25 MG tablet    Basic metabolic panel    4. Pure hypercholesterolemia  E78.00        Medications Discontinued During This Encounter  Medication Reason   famotidine (PEPCID) 20 MG tablet  Duplicate   losartan (COZAAR) 100 MG tablet    gabapentin (NEURONTIN) 100 MG capsule    Cetirizine HCl 10 MG CAPS    meloxicam (MOBIC) 7.5 MG tablet    senna-docusate (SENOKOT-S) 8.6-50 MG tablet    Ubrogepant (UBRELVY) 50 MG TABS    Vitamins A & D (VITAMIN A & D) ointment     Meds ordered this encounter  Medications   olmesartan-hydrochlorothiazide (BENICAR HCT) 40-25 MG tablet    Sig: Take 1 tablet by mouth every morning.    Dispense:  30 tablet    Refill:  2   Orders Placed This Encounter  Procedures   Basic metabolic panel   EKG 52-DPOE   PCV ECHOCARDIOGRAM COMPLETE    Standing Status:   Future    Standing Expiration Date:   01/03/2023   Recommendations:   [name] is a 76 y.o. fairly active Caucasian female patient with longstanding hypertension, hyperlipidemia, referred to me for evaluation of coronary calcium noted on the CT scan of the chest.  Patient is very active, takes care of her 1 acre lot including moving, she just put up a fence around her vegetable band and has not had any chest pain or dyspnea.  Her physical examination is consistent with mild aortic stenosis, she has had an echocardiogram 2-1/2 years ago that had revealed aortic sclerosis.  In view of this, she does not need stress testing as her lipids are under excellent control and risk factors are well controlled and she is asymptomatic.  Continue statins.  External labs reviewed.  I will repeat echocardiogram.  Her blood pressure is almost at goal, however she does have chronic bilateral leg edema and presently on amlodipine, I would like to try to discontinue  this and switch her to olmesartan HCT 40/25 mg in the morning and obtain a BMP in 2 weeks to see if leg edema would improve and blood pressure would be at goal.  We will see her back in 6 weeks for follow-up, if she remains stable on a as needed basis.    Adrian Prows, MD, [facility] 01/02/2022, 12:49 PM Office: [phone]

## 2022-01-02 NOTE — Patient Instructions (Signed)
Please go to the [facility], there is a location 1 close by to you

## 2022-01-10 ENCOUNTER — Telehealth: Payer: Self-pay

## 2022-01-10 NOTE — Telephone Encounter (Signed)
Called pt to inform her about the message above. Pt understood and will start taking the Olmesartan Thursday due that tomorrow she has a trip to go and will last the whole day.

## 2022-01-10 NOTE — Telephone Encounter (Signed)
Hope she was not taking losartan also. If she was that explains. Otherwise, I prefer her to stop Amlodipine and start Olmesartan  HCT 40/25 but take 1/2 tablet only

## 2022-01-10 NOTE — Telephone Encounter (Signed)
Pt called to inform us that on the 05/23 you stopped amlodipine '5mg'$  and start olmesartan/HCTZ 40/'25mg'$ . Pt mention her bp drop to 93/54. Pt mention she almost passed out. Her friend had to drive her home. Pt mention she has not had the new medication since 05/24. Pt mention she called her PCP a few days after her episode of low bp  and was told to start her amlodipine '5mg'$  again. Pt bp this morning was 119/72

## 2022-01-12 ENCOUNTER — Other Ambulatory Visit: Payer: Medicare Other

## 2022-01-17 ENCOUNTER — Other Ambulatory Visit: Payer: Medicare Other

## 2022-01-19 ENCOUNTER — Other Ambulatory Visit: Payer: Medicare Other

## 2022-01-19 LAB — BASIC METABOLIC PANEL
BUN/Creatinine Ratio: 22 (ref 12–28)
BUN: 17 mg/dL (ref 8–27)
CO2: 23 mmol/L (ref 20–29)
Calcium: 9.5 mg/dL (ref 8.7–10.3)
Chloride: 105 mmol/L (ref 96–106)
Creatinine, Ser: 0.78 mg/dL (ref 0.57–1.00)
Glucose: 80 mg/dL (ref 70–99)
Potassium: 4.3 mmol/L (ref 3.5–5.2)
Sodium: 142 mmol/L (ref 134–144)
eGFR: 78 mL/min/{1.73_m2} (ref >59)

## 2022-01-31 ENCOUNTER — Ambulatory Visit: Payer: Medicare Other

## 2022-01-31 DIAGNOSIS — I350 Nonrheumatic aortic (valve) stenosis: Secondary | ICD-10-CM

## 2022-01-31 DIAGNOSIS — I2510 Atherosclerotic heart disease of native coronary artery without angina pectoris: Secondary | ICD-10-CM

## 2022-02-10 ENCOUNTER — Telehealth: Payer: Self-pay | Admitting: Surgical

## 2022-02-10 NOTE — Progress Notes (Unsigned)
Primary Physician/Referring:  Jilda Panda, MD  Patient ID: [name], female    DOB: 1946/06/06, 76 y.o.   MRN: [id number]  No chief complaint on file.  HPI:    [name]  is a 76 y.o.  fairly active Caucasian female patient with longstanding hypertension, hyperlipidemia, referred to me for evaluation of coronary calcium noted on the CT scan of the chest.  Patient was last seen in the office 01/02/2022 at which time due to leg edema and uncontrolled hypertension switched her from amlodipine to olmesartan/hydrochlorothiazide, repeat BMP remained stable.  Also last office visit ordered repeat echocardiogram to follow-up on aortic stenosis.  Echocardiogram noted preserved LVEF with mild MR and TR, no evidence of aortic stenosis.  Patient now presents for 6-week follow-up. ***  ***PRN is stable.   Patient is very active, takes care of her 1 acre lot including moving, she just put up a fence around her vegetable band and has not had any chest pain or dyspnea.    Past Medical History:  Diagnosis Date   COPD (chronic obstructive pulmonary disease) ([place])    Glaucoma    History of kidney disease    History of kidney disease 06/11/2019   History of recurrent UTIs    Hyperlipidemia    Hypertension    Lichen sclerosus January 2017   vulva/perianal region   Non-melanoma skin cancer 08/2015   right leg   Osteoporosis    Post-menopausal     Prediabetes    Past Surgical History:  Procedure Laterality Date   CLOSED REDUCTION ELBOW FRACTURE Right 12/12/2019   Procedure: CLOSED REDUCTION RIGHT ELBOW;  Surgeon: Shona Needles, MD;  Location: [facility];  Service: Orthopedics;  Laterality: Right;   EYE SURGERY Right    Stent placement   FOOT SURGERY Right 06/2013   right 2nd toe   HEMATOMA EVACUATION Left 2007   left calf - excision of hemangioma with thrombus   Family History  Problem Relation Age of Onset   Lung cancer Mother 12   Heart attack Father 45       4 heart  attacks   Heart attack Brother 78    Social History   Tobacco Use   Smoking status: Never   Smokeless tobacco: Never  Substance Use Topics   Alcohol use: Not Currently   Marital Status: Widowed  ROS  Review of Systems  Cardiovascular:  Positive for leg swelling (chronic). Negative for chest pain and dyspnea on exertion.   Objective  Last menstrual period 08/14/1985. There is no height or weight on file to calculate BMI.     01/02/2022   11:14 AM 12/02/2021   11:36 AM 10/26/2021   10:04 AM  Vitals with BMI  Height 5' 6"  5' 6"  5' 6"   Weight 177 lbs 10 oz 177 lbs 180 lbs 6 oz  BMI 28.68 99.37 16.96  Systolic 789 381 017  Diastolic 84 70 64  Pulse 57 69 61    Physical Exam Neck:     Vascular: No carotid bruit or JVD.  Cardiovascular:     Rate and Rhythm: Normal rate and regular rhythm.     Pulses: Intact distal pulses.     Heart sounds: Murmur heard.     Midsystolic murmur is present with a grade of 2/6 at the upper right sternal border radiating to the apex.     No gallop.  Pulmonary:     Effort: Pulmonary effort is normal.  Breath sounds: Normal breath sounds.  Abdominal:     General: Bowel sounds are normal.     Palpations: Abdomen is soft.  Musculoskeletal:     Right lower leg: Edema (2+ pitting) present.     Left lower leg: Edema (2+ pitting) present.     Medications and allergies   Allergies  Allergen Reactions   Contrast Media [Iodinated Contrast Media] Hives   Ether Other (See Comments)    hallucinations   Iohexol      Code: HIVES, Desc: pt had iv contrast for the first time today. doing a test dose w/ an miroi, 20cc contrast, pt immediately broke out in hives, itching, swelling of lips and tongue.  Check w/ rad concerning recommendations.50 mg of benedryl given po per dr Tery Sanfilippo., Onset Date: [id number]      Medication list after today's encounter   Current Outpatient Medications:    albuterol (VENTOLIN HFA) 108 (90 Base) MCG/ACT inhaler, Inhale 2  puffs into the lungs every 6 (six) hours as needed for wheezing or shortness of breath., Disp: , Rfl:    amLODipine (NORVASC) 5 MG tablet, Take 5 mg by mouth daily. Hold leg edema trial to see if leg swelling imporves 01/02/22, Disp: , Rfl:    aspirin 81 MG tablet, Take 81 mg by mouth daily., Disp: , Rfl:    atorvastatin (LIPITOR) 20 MG tablet, Take 1 tablet by mouth at bedtime., Disp: , Rfl: 11   budesonide-formoterol (SYMBICORT) 160-4.5 MCG/ACT inhaler, Inhale 2 puffs into the lungs daily., Disp: 10.2 g, Rfl: 5   calcium carbonate (OS-CAL) 600 MG TABS tablet, Take 600 mg by mouth 2 (two) times daily with a meal., Disp: , Rfl:    Cholecalciferol (VITAMIN D-3) 1000 UNITS CAPS, Take 1,000 Units by mouth daily. , Disp: , Rfl:    famotidine (PEPCID) 20 MG tablet, Take 1 tablet by mouth daily., Disp: , Rfl:    furosemide (LASIX) 20 MG tablet, Take 20 mg by mouth daily., Disp: , Rfl:    lidocaine (XYLOCAINE) 5 % ointment, Apply 1 application topically 4 (four) times daily as needed., Disp: 30 g, Rfl: 1   Misc Natural Products (FOCUSED MIND PO), Take 1 tablet by mouth daily., Disp: , Rfl:    Multiple Vitamin (MULTIVITAMIN WITH MINERALS) TABS tablet, Take 1 tablet by mouth daily., Disp: , Rfl:    NON FORMULARY, Take 2 tablets by mouth daily. MOVE FREE ADVANCED, Disp: , Rfl:    olmesartan-hydrochlorothiazide (BENICAR HCT) 40-25 MG tablet, Take 1 tablet by mouth every morning., Disp: 30 tablet, Rfl: 2   polyethylene glycol (MIRALAX / GLYCOLAX) 17 g packet, Take 17 g by mouth daily., Disp: , Rfl:    vitamin B-12 (CYANOCOBALAMIN) 500 MCG tablet, Take 500 mcg by mouth daily., Disp: , Rfl:    vitamin C (ASCORBIC ACID) 500 MG tablet, Take 500 mg by mouth daily., Disp: , Rfl:    zinc gluconate 50 MG tablet, Take 50 mg by mouth daily., Disp: , Rfl:   Laboratory examination:       Latest Ref Rng & Units 10/26/2021   10:40 AM 12/13/2019    2:01 AM 12/11/2019   10:11 PM  CBC  WBC 4.0 - 10.5 K/uL 5.9  3.7  4.0    Hemoglobin 12.0 - 15.0 g/dL 11.8  10.3  11.1   Hematocrit 36.0 - 46.0 % 36.2  33.2  36.0   Platelets 150.0 - 400.0 K/uL 253.0  142  186    External labs:  Labs 12/09/2021:  Total cholesterol 08/14/1928, triglycerides 33, HDL 73, LDL 48.  Non-HDL cholesterol 57.  BUN 19, creatinine 0.80, EGFR 76 mm, potassium 4.2, LFTs normal.  Hb 12.6/HCT 39.2, platelets 208, normal indicis.  Radiology:   High-resolution CT scan of the chest 11/17/2021: Cardiovascular: Aortic atherosclerosis. Normal heart size. Left and right coronary artery calcifications. No pericardial effusion. Lobular air trapping on expiratory phase imaging, suggest small airway disease. Hiatal hernia.  Cardiac Studies:   [facility]V ECHOCARDIOGRAM COMPLETE 01/31/2022 Left ventricle cavity is normal in size and wall thickness. Normal global wall motion. Normal LV systolic function with EF 60%. Normal diastolic filling pattern. Left atrial cavity is mildly dilated. Aneurysmal interatrial septum without 2D or color Doppler evidence of shunting. Mild (Grade I) mitral regurgitation. Mild tricuspid regurgitation. No evidence of pulmonary hypertension.   EKG:   EKG 01/02/2022: Normal sinus rhythm with rate of 57 bpm, leftward axis.  LVH.  No evidence of ischemia.  Early repolarization.  No significant change from 12/08/2021. No change in EKG from 12/12/2019.  Assessment   No diagnosis found.    There are no discontinued medications.   No orders of the defined types were placed in this encounter.  No orders of the defined types were placed in this encounter.  Recommendations:   [name] is a 76 y.o. fairly active Caucasian female patient with longstanding hypertension, hyperlipidemia, referred to me for evaluation of coronary calcium noted on the CT scan of the chest.  Patient was last seen in the office 01/02/2022 at which time due to leg edema and uncontrolled hypertension switched her from amlodipine to  olmesartan/hydrochlorothiazide, repeat BMP remained stable.  Also last office visit ordered repeat echocardiogram to follow-up on aortic stenosis.  Echocardiogram noted preserved LVEF with mild MR and TR, no evidence of aortic stenosis.  Patient now presents for 6-week follow-up. ***  ***PRN is stable.   Patient is very active, takes care of her 1 acre lot including moving, she just put up a fence around her vegetable band and has not had any chest pain or dyspnea.  Her physical examination is consistent with mild aortic stenosis, she has had an echocardiogram 2-1/2 years ago that had revealed aortic sclerosis.  In view of this, she does not need stress testing as her lipids are under excellent control and risk factors are well controlled and she is asymptomatic.  Continue statins.  External labs reviewed.  I will repeat echocardiogram.  Her blood pressure is almost at goal, however she does have chronic bilateral leg edema and presently on amlodipine, I would like to try to discontinue this and switch her to olmesartan HCT 40/25 mg in the morning and obtain a BMP in 2 weeks to see if leg edema would improve and blood pressure would be at goal.  We will see her back in 6 weeks for follow-up, if she remains stable on a as needed basis.    Alethia Berthold, MD, [facility] 02/10/2022, 10:33 AM Office: [phone]

## 2022-02-10 NOTE — Telephone Encounter (Signed)
Pt called asking for pain med for foot pain. Pt last visit 4/23. Please send to pharmacy on file. Pt phone number is [phone].

## 2022-02-11 ENCOUNTER — Other Ambulatory Visit: Payer: Self-pay | Admitting: Surgical

## 2022-02-11 MED ORDER — DICLOFENAC SODIUM 1 % EX GEL: 2.0000 g | Freq: Four times a day (QID) | CUTANEOUS | 2 refills | Status: AC

## 2022-02-11 MED ORDER — ACETAMINOPHEN ER 650 MG PO TBCR: 650.0000 mg | EXTENDED_RELEASE_TABLET | Freq: Three times a day (TID) | ORAL | 2 refills | Status: AC | PRN

## 2022-02-11 NOTE — Telephone Encounter (Signed)
Sent in prescription for Tylenol Extra Strength and topical diclofenac gel.  Let me know if this does not help her pain and we can try some else.

## 2022-02-13 ENCOUNTER — Encounter: Payer: Self-pay | Admitting: Student

## 2022-02-13 ENCOUNTER — Ambulatory Visit: Payer: Medicare Other | Admitting: Student

## 2022-02-13 VITALS — BP 132/62 | HR 61 | Temp 98.0°F | Resp 16 | Ht 66.0 in | Wt 177.0 lb

## 2022-02-13 DIAGNOSIS — I2510 Atherosclerotic heart disease of native coronary artery without angina pectoris: Secondary | ICD-10-CM

## 2022-02-13 DIAGNOSIS — I10 Essential (primary) hypertension: Secondary | ICD-10-CM

## 2022-02-13 MED ORDER — CHLORTHALIDONE 25 MG PO TABS: 25.0000 mg | ORAL_TABLET | Freq: Every day | ORAL | 3 refills | Status: AC

## 2022-02-25 LAB — BASIC METABOLIC PANEL
BUN/Creatinine Ratio: 17 (ref 12–28)
BUN: 16 mg/dL (ref 8–27)
CO2: 25 mmol/L (ref 20–29)
Calcium: 9.7 mg/dL (ref 8.7–10.3)
Chloride: 101 mmol/L (ref 96–106)
Creatinine, Ser: 0.92 mg/dL (ref 0.57–1.00)
Glucose: 104 mg/dL — ABNORMAL HIGH (ref 70–99)
Potassium: 3.7 mmol/L (ref 3.5–5.2)
Sodium: 140 mmol/L (ref 134–144)
eGFR: 64 mL/min/{1.73_m2} (ref >59)

## 2022-02-27 ENCOUNTER — Ambulatory Visit: Payer: Medicare Other | Admitting: Student

## 2022-02-27 DIAGNOSIS — I10 Essential (primary) hypertension: Secondary | ICD-10-CM

## 2022-02-27 NOTE — Progress Notes (Signed)
Patient returned our call she is aware of results

## 2022-02-27 NOTE — Progress Notes (Signed)
Patient presented to office for blood pressure check. She reports she has not taken her antihypertensive medications today.  She brings with her a written log of blood pressure readings.  Blood pressure is under excellent control at home, however was still elevated in the office today.  Advised patient to follow-up with PCP as recommended at previous office visit.  Will not make changes to medications at this time.  Recommend patient have blood pressure machine checked for accuracy of readings.   Alethia Berthold, PA-C 02/27/2022, 9:23 AM Office: [phone]

## 2022-02-27 NOTE — Progress Notes (Signed)
Called pt no answer, left a vm

## 2022-03-09 ENCOUNTER — Other Ambulatory Visit: Payer: Self-pay | Admitting: Internal Medicine

## 2022-03-09 ENCOUNTER — Telehealth: Payer: Self-pay | Admitting: Orthopedic Surgery

## 2022-03-09 DIAGNOSIS — Z1231 Encounter for screening mammogram for malignant neoplasm of breast: Secondary | ICD-10-CM

## 2022-03-09 NOTE — Telephone Encounter (Signed)
Patient called asked if she can have her mailbox moved closer to her house because she can barely walk. Patient said her mailbox will be moved to her front door. Patient said if she get a letter she can take it to the post office. 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 Patient said she also have a right knee problem. The number to contact patient is [phone]

## 2022-03-09 NOTE — Telephone Encounter (Signed)
An addition to previous note from [place] patient called in stating she has to add its a [place] situation in the letter so it can be approved, being her walking situation.

## 2022-03-12 NOTE — Telephone Encounter (Signed)
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  Her walking endurance is severely limited by these conditions.  Having the mailbox closer to her door would allow her to have less pain when dealing with mail issues.  Please not hesitate if you have any questions.  Thank you

## 2022-03-13 NOTE — Telephone Encounter (Signed)
Patient aware note at front desk
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
## 2022-03-24 ENCOUNTER — Ambulatory Visit: Payer: Medicare Other | Admitting: Obstetrics and Gynecology

## 2022-03-24 ENCOUNTER — Encounter: Payer: Self-pay | Admitting: Obstetrics and Gynecology

## 2022-03-24 VITALS — BP 110/70 | HR 80 | Ht 66.0 in | Wt 187.0 lb

## 2022-03-24 DIAGNOSIS — R1032 Left lower quadrant pain: Secondary | ICD-10-CM

## 2022-03-24 DIAGNOSIS — L900 Lichen sclerosus et atrophicus: Secondary | ICD-10-CM

## 2022-03-24 DIAGNOSIS — F4321 Adjustment disorder with depressed mood: Secondary | ICD-10-CM | POA: Diagnosis not present

## 2022-03-24 MED ORDER — CLOBETASOL PROPIONATE 0.05 % EX OINT: TOPICAL_OINTMENT | CUTANEOUS | 1 refills | Status: DC

## 2022-03-24 NOTE — Patient Instructions (Addendum)
Adductor Muscle Strain  An adductor muscle strain, also called a groin strain or pull, is an injury to the muscles or tendons on the upper, inner part of the thigh. These muscles are called the adductor or groin muscles. They are responsible for moving the legs across the body or pulling the legs together. A muscle strain occurs when a muscle is overstretched and some muscle fibers are torn. The severity of an adductor muscle strain is rated as Grade 1, 2, or 3. A Grade 3 strain has the most tearing and pain. What are the causes? Adductor muscle strains usually occur during exercise or while participating in sports. This condition may be caused by: A sudden, violent force placed on the muscle, stretching it too far. Stretching the muscles too far or too suddenly, often during side-to-side motion with a sudden change in direction. Putting repeated stress on the adductor muscles over a long period of time. Performing vigorous activity without properly stretching or warming up the adductor muscles beforehand. Not being properly conditioned. What are the signs or symptoms? Symptoms of this condition include: Pain and tenderness in the groin area. This begins as sharp pain and persists as a dull ache. A popping or snapping feeling when the injury occurs (for severe strains). Swelling or bruising. Muscle spasms. Weakness in the leg. Stiffness in the groin area with decreased ability to move the affected muscles. How is this diagnosed? This condition may be diagnosed based on: A physical exam. Your medical history. How well you can do certain range of motion exercises. Imaging tests, such as MRI, ultrasound, or X-rays. Your strain may be rated based on how severe it is. The ratings are: Grade 1 strain (mild). Muscles are overstretched. There may be very small muscle tears. This type of strain generally heals in about one week. Grade 2 strain (moderate). Muscles are partially torn. This may take  one to two months to heal. Grade 3 strain (severe). Muscles are completely torn. A severe strain can take more than three months to heal. Grade 3 gluteal strains are rare. How is this treated? An adductor strain will often heal on its own. If needed, this condition may be treated with: PRICE therapy. PRICE stands for protection of the injured area, rest, ice, pressure (compression), and elevation. Medicines to help manage pain and swelling (anti-inflammatory medicines). Crutches. You may be directed to use these for the first few days to minimize your pain. Depending on the severity of the muscle strain, recovery time may vary from a few weeks to several months. Severe injuries often require 4-6 weeks for recovery. In those cases, complete healing can take 4-5 months. Follow these instructions at home: PRICE Therapy  Protect the muscle from being injured again. Rest. Do not use the strained muscle if it causes pain. If directed, put ice on the injured area: Put ice in a plastic bag. Place a towel between your skin and the bag. Leave the ice on for 20 minutes, 2-3 times a day. Do this for the first 2 days after the injury. Apply compression by wrapping the injured area with an elastic bandage as told by your health care provider. Raise (elevate) the injured area above the level of your heart while you are sitting or lying down. Activity Do not drive or use heavy machinery while taking prescription pain medicine. Walk, stretch, and do exercises as told by your health care provider. Only do these activities if you can do so without any pain. General instructions   Take over-the-counter and prescription medicines only as told by your health care provider. Follow your treatment plan as told by your health care provider. This may include: Physical therapy. Massage. Local electrical stimulation (transcutaneous electrical nerve stimulation, TENS). Keep all follow-up visits. This is important. How  is this prevented? Warm up and stretch before being active. Cool down and stretch after being active. Give your body time to rest between periods of activity. Make sure to use equipment that fits you. Be safe and responsible while being active to avoid slips and falls. Maintain physical fitness, including: Proper conditioning in the adductor muscles. Overall strength, flexibility, and endurance. Contact a health care provider if: You have increased pain or swelling in the affected area. Your symptoms are not improving or they are getting worse. Summary An adductor muscle strain, also called a groin strain or pull, is an injury to the muscles or tendons on the upper, inner part of the thigh. A muscle strain occurs when a muscle is overstretched and some muscle fibers are torn. Depending on the severity of the muscle strain, recovery time may vary from a few weeks to several months. This information is not intended to replace advice given to you by your health care provider. Make sure you discuss any questions you have with your health care provider. Document Revised: 12/29/2020 Document Reviewed: 12/29/2020 Elsevier Patient Education  [place] Loss, Adult People experience loss in many different ways throughout their lives. Events such as moving, changing jobs, and losing friends can create a sense of loss. The loss may be as serious as a major health change, divorce, death of a pet, or death of a loved one. All of these types of loss are likely to create a physical and emotional reaction known as grief. Grief is the result of a major change or an absence of something or someone that you count on. Grief is a normal reaction to loss. A variety of factors can affect your grieving experience, including: The nature of your loss. Your relationship to what or whom you lost. Your understanding of grief and how to manage it. Your support system. Be aware that when grief becomes  extreme, it can lead to more severe issues like isolation, depression, anxiety, or suicidal thoughts. Talk with your health care provider if you have any of these issues. How to manage lifestyle changes Keep to your normal routine as much as possible. If you have trouble focusing or doing normal activities, it is acceptable to take some time away from your normal routine. Spend time with friends and loved ones. Eat a healthy diet, get plenty of sleep, and rest when you feel tired. How to recognize changes  The way that you deal with your grief will affect your ability to function as you normally do. When grieving, you may experience these changes: Numbness, shock, sadness, anxiety, anger, denial, and guilt. Thoughts about death. Unexpected crying. A physical sensation of emptiness in your stomach. Problems sleeping and eating. Tiredness (fatigue). Loss of interest in normal activities. Dreaming about or imagining seeing the person who died. A need to remember what or whom you lost. Difficulty thinking about anything other than your loss for a period of time. Relief. If you have been expecting the loss for a while, you may feel a sense of relief when it happens. Follow these instructions at home: Activity Express your feelings in healthy ways, such as: Talking with others about your loss. It may be helpful to find  others who have had a similar loss, such as a support group. Writing down your feelings in a journal. Doing physical activities to release stress and emotional energy. Doing creative activities like painting, sculpting, or playing or listening to music. Practicing resilience. This is the ability to recover and adjust after facing challenges. Reading some resources that encourage resilience may help you to learn ways to practice those behaviors.  General instructions Be patient with yourself and others. Allow the grieving process to happen, and remember that grieving takes  time. It is likely that you may never feel completely done with some grief. You may find a way to move on while still cherishing memories and feelings about your loss. Accepting your loss is a process. It can take months or longer to adjust. Keep all follow-up visits. This is important. Where to find support To get support for managing loss: Ask your health care provider for help and recommendations, such as grief counseling or therapy. Think about joining a support group for people who are managing a loss. Where to find more information You can find more information about managing loss from: American Society of Clinical Oncology: www.cancer.net American Psychological Association: TVStereos.ch Contact a health care provider if: Your grief is extreme and keeps getting worse. You have ongoing grief that does not improve. Your body shows symptoms of grief, such as illness. You feel depressed, anxious, or hopeless. Get help right away if: You have thoughts about hurting yourself or others. Get help right away if you feel like you may hurt yourself or others, or have thoughts about taking your own life. Go to your nearest emergency room or: Call 911. Call the [place] [facility] at (872) 173-1449 or 988. This is open 24 hours a day. Text the Crisis Text Line at 628-060-7905. Summary Grief is the result of a major change or an absence of someone or something that you count on. Grief is a normal reaction to loss. The depth of grief and the period of recovery depend on the type of loss and your ability to adjust to the change and process your feelings. Processing grief requires patience and a willingness to accept your feelings and talk about your loss with people who are supportive. It is important to find resources that work for you and to realize that people experience grief differently. There is not one grieving process that works for everyone in the same way. Be aware that when  grief becomes extreme, it can lead to more severe issues like isolation, depression, anxiety, or suicidal thoughts. Talk with your health care provider if you have any of these issues. This information is not intended to replace advice given to you by your health care provider. Make sure you discuss any questions you have with your health care provider. Document Revised: 03/21/2021 Document Reviewed: 03/21/2021 Elsevier Patient Education  [place].

## 2022-04-12 ENCOUNTER — Encounter (HOSPITAL_COMMUNITY): Payer: Self-pay | Admitting: Emergency Medicine

## 2022-04-12 ENCOUNTER — Ambulatory Visit (HOSPITAL_COMMUNITY)
Admission: EM | Admit: 2022-04-12 | Discharge: 2022-04-12 | Disposition: A | Discharge status: Home / Self Care | Payer: Medicare Other | Attending: Sports Medicine | Admitting: Sports Medicine

## 2022-04-12 DIAGNOSIS — S60561A Insect bite (nonvenomous) of right hand, initial encounter: Secondary | ICD-10-CM

## 2022-04-12 DIAGNOSIS — W57XXXA Bitten or stung by nonvenomous insect and other nonvenomous arthropods, initial encounter: Secondary | ICD-10-CM

## 2022-04-12 MED ORDER — TRIAMCINOLONE ACETONIDE 0.1 % EX CREA: 1.0000 | TOPICAL_CREAM | Freq: Two times a day (BID) | CUTANEOUS | 0 refills | Status: AC

## 2022-04-12 NOTE — Discharge Instructions (Addendum)
Your ant bites will heal with time Pick up the Triamcinolone cream from your pharmacy and apply it to your hand up to 2x per day  Ice will help it feel better Take a zyrtec (the little white pill for insect bites) which will help it feel better too

## 2022-04-12 NOTE — ED Triage Notes (Signed)
Reports when leaving the dermatologist office was picking some of their flowers to take home to plant and was bit on right hand by little red ants and bit by bee on right arm.

## 2022-04-12 NOTE — ED Provider Notes (Signed)
[facility]   [id number] 04/12/22 Arrival Time: 7616  ASSESSMENT & PLAN:  1. Insect bite of right hand, initial encounter    -Prescription for Kenalog cream provided to apply topically twice per day for symptomatic relief.  I recommended ice and oral antihistamines to with swelling.  All questions answered.  Reassurance provided.  Follow-up with primary if worsening.  Meds ordered this encounter  Medications   triamcinolone cream (KENALOG) 0.1 %    Sig: Apply 1 Application topically 2 (two) times daily.    Dispense:  30 g    Refill:  0     Discharge Instructions      Your ant bites will heal with time Pick up the Triamcinolone cream from your pharmacy and apply it to your hand up to 2x per day  Ice will help it feel better Take a zyrtec (the little white pill for insect bites) which will help it feel better too      Follow-up Information     Jilda Panda, MD In 1 week.   Specialty: Internal Medicine Why: If symptoms worsen Contact information: [phone] [place] [phone]                  Reviewed expectations re: course of current medical issues. Questions answered. Outlined signs and symptoms indicating need for more acute intervention. Patient verbalized understanding. After Visit Summary given.   SUBJECTIVE: Pleasant 76 year old female who comes to urgent care to be evaluated for ant bites on her right hand.  She was at her dermatologist office earlier today when she bent down to pick some flowers from their flower bed.  When she got in the ground her right hand was swarmed by fire aunts.  She reports that bit her multiple times on the dorsum of the right hand.  She has not taking any antihistamine or anti-inflammatory medicines for this but has been icing it since the event happened.  She called her primary doctor who is out sick today so they recommended she come to urgent care for evaluation.  This is not the first  time this has happened as she is a frequent gardener and has been stung by bees and bitten by ants many times in the past.  She denies ever having history of an anaphylactic reaction to insect bites.  She denies any chest pain, shortness of breath, throat swelling or lip swelling today.  Denies any redness or numbness proximally up the arm.  Patient's last menstrual period was 08/14/1985 (approximate). Past Surgical History:  Procedure Laterality Date   CLOSED REDUCTION ELBOW FRACTURE Right 12/12/2019   Procedure: CLOSED REDUCTION RIGHT ELBOW;  Surgeon: Shona Needles, MD;  Location: [facility];  Service: Orthopedics;  Laterality: Right;   EYE SURGERY Right    Stent placement   FOOT SURGERY Right 06/2013   right 2nd toe   HEMATOMA EVACUATION Left 2007   left calf - excision of hemangioma with thrombus     OBJECTIVE:  Vitals:   04/12/22 1205  BP: (!) 170/80  Pulse: (!) 50  Resp: 18  Temp: (!) 97.5 F (36.4 C)  TempSrc: Oral  SpO2: 100%     Physical Exam Vitals reviewed.  Constitutional:      General: She is not in acute distress.    Appearance: She is not ill-appearing.  Pulmonary:     Effort: Pulmonary effort is normal. No respiratory distress.     Breath sounds: No wheezing.  Musculoskeletal:  General: Normal range of motion.  Skin:      Neurological:     Mental Status: She is alert.      Labs:  Labs Reviewed - No data to display  Imaging: No results found.   Allergies  Allergen Reactions   Contrast Media [Iodinated Contrast Media] Hives   Ether Other (See Comments)    hallucinations   Iohexol      Code: HIVES, Desc: pt had iv contrast for the first time today. doing a test dose w/ an miroi, 20cc contrast, pt immediately broke out in hives, itching, swelling of lips and tongue.  Check w/ rad concerning recommendations.50 mg of benedryl given po per dr Tery Sanfilippo., Onset Date: [id number]                                                Past Medical  History:  Diagnosis Date   COPD (chronic obstructive pulmonary disease) ([place])    Glaucoma    History of kidney disease    History of kidney disease 06/11/2019   History of recurrent UTIs    Hyperlipidemia    Hypertension    Lichen sclerosus January 2017   vulva/perianal region   Non-melanoma skin cancer 08/2015   right leg   Osteoporosis    Post-menopausal     Prediabetes     Social History   Socioeconomic History   Marital status: Widowed    Spouse name: Not on file   Number of children: 1   Years of education: Not on file   Highest education level: Not on file  Occupational History   Not on file  Tobacco Use   Smoking status: Never   Smokeless tobacco: Never  Vaping Use   Vaping Use: Never used  Substance and Sexual Activity   Alcohol use: Not Currently   Drug use: No   Sexual activity: Not Currently    Birth control/protection: Post-menopausal  Other Topics Concern   Not on file  Social History Narrative   Not on file   Social Determinants of Health   Financial Resource Strain: Not on file  Food Insecurity: Not on file  Transportation Needs: Not on file  Physical Activity: Not on file  Stress: Not on file  Social Connections: Not on file  Intimate Partner Violence: Not on file    Family History  Problem Relation Age of Onset   Lung cancer Mother 56   Heart attack Father 6       4 heart attacks   Heart attack Brother 29      Colbin Jovel, Dorian Pod, MD 04/12/22 1334

## 2022-04-12 NOTE — ED Triage Notes (Signed)
Bee sting to arm with redness, no distress noted.

## 2022-04-30 ENCOUNTER — Ambulatory Visit (HOSPITAL_COMMUNITY)
Admission: EM | Admit: 2022-04-30 | Discharge: 2022-04-30 | Disposition: A | Discharge status: Home / Self Care | Payer: Medicare Other

## 2022-04-30 ENCOUNTER — Encounter (HOSPITAL_COMMUNITY): Payer: Self-pay

## 2022-04-30 DIAGNOSIS — R1032 Left lower quadrant pain: Secondary | ICD-10-CM | POA: Diagnosis not present

## 2022-04-30 LAB — POCT URINALYSIS DIPSTICK, ED / UC
Bilirubin Urine: NEGATIVE
Glucose, UA: NEGATIVE mg/dL
Hgb urine dipstick: NEGATIVE
Ketones, ur: NEGATIVE mg/dL
Leukocytes,Ua: NEGATIVE
Nitrite: NEGATIVE
Protein, ur: NEGATIVE mg/dL
Specific Gravity, Urine: 1.015 (ref 1.005–1.030)
Urobilinogen, UA: 0.2 mg/dL (ref 0.0–1.0)
pH: 5.5 (ref 5.0–8.0)

## 2022-04-30 NOTE — ED Triage Notes (Signed)
Reports left-sided groin pain since May 2023. Pt reports a scan was performed but never received her results.

## 2022-04-30 NOTE — Discharge Instructions (Signed)
Use Voltaren gel to the area of greatest tenderness in your groin as directed.   You may take Tylenol 1000 mg (extra strength) every 6 hours as needed for pain.  Call your primary care provider to further discuss your elevated blood pressure and for medication management.  Call your primary care provider to schedule an appointment to discuss possible physical therapy related to your groin pain as I believe this would greatly help you achieve the results you are seeking with working out.  If you develop any new or worsening symptoms or do not improve in the next 2 to 3 days, please return.  If your symptoms are severe, please go to the emergency room.  Follow-up with your primary care provider for further evaluation and management of your symptoms as well as ongoing wellness visits.  I hope you feel better!

## 2022-04-30 NOTE — ED Provider Notes (Signed)
[facility]    CSN: [id number] Arrival date & time: 04/30/22  1003      History   Chief Complaint Chief Complaint  Patient presents with  . Groin Pain    HPI [name] is a 76 y.o. female.    Groin Pain   Past Medical History:  Diagnosis Date  . COPD (chronic obstructive pulmonary disease) ([place])   . Glaucoma   . History of kidney disease   . History of kidney disease 06/11/2019  . History of recurrent UTIs   . Hyperlipidemia   . Hypertension   . Lichen sclerosus January 2017   vulva/perianal region  . Non-melanoma skin cancer 08/2015   right leg  . Osteoporosis   . Post-menopausal    . Prediabetes     Patient Active Problem List   Diagnosis Date Noted  . Vaso vagal episode 12/12/2019  . Closed posterior dislocation of right elbow 12/12/2019  . Essential hypertension 12/12/2019  . History of kidney disease 06/11/2019  . Prediabetes   . Status post foot surgery 06/27/2013  . Other hammer toe (acquired) 06/10/2013  . Pain in foot 06/10/2013  . Onychomycosis 06/10/2013    Past Surgical History:  Procedure Laterality Date  . CLOSED REDUCTION ELBOW FRACTURE Right 12/12/2019   Procedure: CLOSED REDUCTION RIGHT ELBOW;  Surgeon: Shona Needles, MD;  Location: [facility];  Service: Orthopedics;  Laterality: Right;  . EYE SURGERY Right    Stent placement  . FOOT SURGERY Right 06/2013   right 2nd toe  . HEMATOMA EVACUATION Left 2007   left calf - excision of hemangioma with thrombus    OB History     Gravida  6   Para  1   Term  1   Preterm  0   AB  5   Living  1      SAB  5   IAB  0   Ectopic  0   Multiple  0   Live Births  1            Home Medications    Prior to Admission medications   Medication Sig Start Date End Date Taking? Authorizing Provider  acetaminophen (TYLENOL 8 HOUR) 650 MG CR tablet Take 1 tablet (650 mg total) by mouth every 8 (eight) hours as needed for pain. 02/11/22   Magnant, Charles L, PA-C   albuterol (VENTOLIN HFA) 108 (90 Base) MCG/ACT inhaler Inhale 2 puffs into the lungs every 6 (six) hours as needed for wheezing or shortness of breath.    [provider]  alendronate (FOSAMAX) 70 MG tablet Take 70 mg by mouth once a week. 02/11/22   [provider]  amLODipine (NORVASC) 5 MG tablet Take 5 mg by mouth daily. 04/25/22   [provider]  aspirin 81 MG tablet Take 81 mg by mouth daily.    [provider]  atorvastatin (LIPITOR) 20 MG tablet Take 1 tablet by mouth at bedtime. 06/26/15   [provider]  budesonide-formoterol (SYMBICORT) 160-4.5 MCG/ACT inhaler Inhale 2 puffs into the lungs daily. 12/02/21   Mannam, Hart Robinsons, MD  calcium carbonate (OS-CAL) 600 MG TABS tablet Take 600 mg by mouth 2 (two) times daily with a meal.    [provider]  chlorthalidone (HYGROTON) 25 MG tablet Take 1 tablet (25 mg total) by mouth daily. 02/13/22 06/13/22  Cantwell, Celeste C, PA-C  Cholecalciferol (VITAMIN D-3) 1000 UNITS CAPS Take 1,000 Units by mouth daily.  [provider]  clobetasol ointment (TEMOVATE) 0.05 % Use a pea sized amount 2 x a week. Increase to 2 x a day for up to 2 weeks as needed 03/24/22   Salvadore Dom, MD  diclofenac Sodium (VOLTAREN) 1 % GEL Apply 2 g topically 4 (four) times daily. 02/11/22   Magnant, Charles L, PA-C  famotidine (PEPCID) 20 MG tablet Take 1 tablet by mouth daily. 07/23/18   [provider]  lidocaine (XYLOCAINE) 5 % ointment Apply 1 application topically 4 (four) times daily as needed. 04/20/21   Salvadore Dom, MD  Misc Natural Products (FOCUSED MIND PO) Take 1 tablet by mouth daily.    [provider]  Multiple Vitamin (MULTIVITAMIN WITH MINERALS) TABS tablet Take 1 tablet by mouth daily.    [provider]  NON FORMULARY Take 2 tablets by mouth daily. MOVE FREE ADVANCED    [provider]  polyethylene glycol (MIRALAX / GLYCOLAX) 17 g packet Take 17 g  by mouth daily.    [provider]  triamcinolone cream (KENALOG) 0.1 % Apply 1 Application topically 2 (two) times daily. 04/12/22   Rafoth, Dorian Pod, MD  vitamin B-12 (CYANOCOBALAMIN) 500 MCG tablet Take 500 mcg by mouth daily.    [provider]  zinc gluconate 50 MG tablet Take 50 mg by mouth daily.    [provider]    Family History Family History  Problem Relation Age of Onset  . Lung cancer Mother 21  . Heart attack Father 72       4 heart attacks  . Heart attack Brother 55    Social History Social History   Tobacco Use  . Smoking status: Never  . Smokeless tobacco: Never  Vaping Use  . Vaping Use: Never used  Substance Use Topics  . Alcohol use: Not Currently  . Drug use: No     Allergies   Contrast media [iodinated contrast media], Ether, and Iohexol   Review of Systems Review of Systems   Physical Exam Triage Vital Signs ED Triage Vitals [04/30/22 1021]  Enc Vitals Group     BP (!) 169/78     Pulse Rate 63     Resp 18     Temp 97.7 F (36.5 C)     Temp Source Oral     SpO2 98 %     Weight      Height      Head Circumference      Peak Flow      Pain Score      Pain Loc      Pain Edu?      Excl. in [place]?    No data found.  Updated Vital Signs BP (!) 169/78 (BP Location: Left Arm)   Pulse 63   Temp 97.7 F (36.5 C) (Oral)   Resp 18   LMP 08/14/1985 (Approximate)   SpO2 98%   Visual Acuity Right Eye Distance:   Left Eye Distance:   Bilateral Distance:    Right Eye Near:   Left Eye Near:    Bilateral Near:     Physical Exam   UC Treatments / Results  Labs (all labs ordered are listed, but only abnormal results are displayed) Labs Reviewed  POCT URINALYSIS DIPSTICK, ED / UC    EKG   Radiology No results found.  Procedures Procedures (including critical care time)  Medications Ordered in UC Medications - No data to display  Initial Impression / Assessment  and Plan / UC Course  I have  reviewed the triage vital signs and the nursing notes.  Pertinent labs & imaging results that were available during my care of the patient were reviewed by me and considered in my medical decision making (see chart for details).     *** Final Clinical Impressions(s) / UC Diagnoses   Final diagnoses:  None   Discharge Instructions   None    ED Prescriptions   None    PDMP not reviewed this encounter.

## 2022-05-30 ENCOUNTER — Ambulatory Visit: Payer: Medicare Other | Admitting: Obstetrics and Gynecology

## 2022-05-31 NOTE — Progress Notes (Deleted)
76 y.o. [id number] Widowed White or Caucasian Not Hispanic or Latino female here for annual exam.      Patient's last menstrual period was 08/14/1985 (approximate).          Sexually active: {yes no:314532}  The current method of family planning is {contraception:315051}.    Exercising: {yes no:314532}  {types:19826} Smoker:  {YES P5382123  Health Maintenance: Pap:  07/28/2016 WNL,  08/15/2012  Negative with neg HR HPV History of abnormal Pap:  no MMG:  07/01/21 right tomo Asymmetry density B Bi-rads 1 neg  BMD:   07/18/2018 Osteoporosis. She was previously on prolia, had side effects. She will f/u with her primary Colonoscopy: 2020 normal no f/u needed  TDaP:  2019 with pcp  Gardasil: n/a   reports that she has never smoked. She has never used smokeless tobacco. She reports that she does not currently use alcohol. She reports that she does not use drugs.  Past Medical History:  Diagnosis Date   COPD (chronic obstructive pulmonary disease) ([place])    Glaucoma    History of kidney disease    History of kidney disease 06/11/2019   History of recurrent UTIs    Hyperlipidemia    Hypertension    Lichen sclerosus January 2017   vulva/perianal region   Non-melanoma skin cancer 08/2015   right leg   Osteoporosis    Post-menopausal     Prediabetes     Past Surgical History:  Procedure Laterality Date   CLOSED REDUCTION ELBOW FRACTURE Right 12/12/2019   Procedure: CLOSED REDUCTION RIGHT ELBOW;  Surgeon: Shona Needles, MD;  Location: [facility];  Service: Orthopedics;  Laterality: Right;   EYE SURGERY Right    Stent placement   FOOT SURGERY Right 06/2013   right 2nd toe   HEMATOMA EVACUATION Left 2007   left calf - excision of hemangioma with thrombus    Current Outpatient Medications  Medication Sig Dispense Refill   acetaminophen (TYLENOL 8 HOUR) 650 MG CR tablet Take 1 tablet (650 mg total) by mouth every 8 (eight) hours as needed for pain. 60 tablet 2   albuterol (VENTOLIN HFA) 108  (90 Base) MCG/ACT inhaler Inhale 2 puffs into the lungs every 6 (six) hours as needed for wheezing or shortness of breath.     alendronate (FOSAMAX) 70 MG tablet Take 70 mg by mouth once a week.     amLODipine (NORVASC) 5 MG tablet Take 5 mg by mouth daily.     aspirin 81 MG tablet Take 81 mg by mouth daily.     atorvastatin (LIPITOR) 20 MG tablet Take 1 tablet by mouth at bedtime.  11   budesonide-formoterol (SYMBICORT) 160-4.5 MCG/ACT inhaler Inhale 2 puffs into the lungs daily. 10.2 g 5   calcium carbonate (OS-CAL) 600 MG TABS tablet Take 600 mg by mouth 2 (two) times daily with a meal.     chlorthalidone (HYGROTON) 25 MG tablet Take 1 tablet (25 mg total) by mouth daily. 30 tablet 3   Cholecalciferol (VITAMIN D-3) 1000 UNITS CAPS Take 1,000 Units by mouth daily.      clobetasol ointment (TEMOVATE) 0.05 % Use a pea sized amount 2 x a week. Increase to 2 x a day for up to 2 weeks as needed 60 g 1   diclofenac Sodium (VOLTAREN) 1 % GEL Apply 2 g topically 4 (four) times daily. 100 g 2   famotidine (PEPCID) 20 MG tablet Take 1 tablet by mouth daily.     lidocaine (XYLOCAINE) 5 %  ointment Apply 1 application topically 4 (four) times daily as needed. 30 g 1   Misc Natural Products (FOCUSED MIND PO) Take 1 tablet by mouth daily.     Multiple Vitamin (MULTIVITAMIN WITH MINERALS) TABS tablet Take 1 tablet by mouth daily.     NON FORMULARY Take 2 tablets by mouth daily. MOVE FREE ADVANCED     polyethylene glycol (MIRALAX / GLYCOLAX) 17 g packet Take 17 g by mouth daily.     triamcinolone cream (KENALOG) 0.1 % Apply 1 Application topically 2 (two) times daily. 30 g 0   vitamin B-12 (CYANOCOBALAMIN) 500 MCG tablet Take 500 mcg by mouth daily.     zinc gluconate 50 MG tablet Take 50 mg by mouth daily.     No current facility-administered medications for this visit.    Family History  Problem Relation Age of Onset   Lung cancer Mother 14   Heart attack Father 39       4 heart attacks   Heart  attack Brother 52    Review of Systems  Exam:   LMP 08/14/1985 (Approximate)   Weight change: '@WEIGHTCHANGE'$ @ Height:      Ht Readings from Last 3 Encounters:  03/24/22 '5\' 6"'$  (1.676 m)  02/13/22 '5\' 6"'$  (1.676 m)  01/02/22 '5\' 6"'$  (1.676 m)    General appearance: alert, cooperative and appears stated age Head: Normocephalic, without obvious abnormality, atraumatic Neck: no adenopathy, supple, symmetrical, trachea midline and thyroid {CHL AMB PHY EX THYROID NORM [phone]::"normal to inspection and palpation"} Lungs: clear to auscultation bilaterally Cardiovascular: regular rate and rhythm Breasts: {Exam; breast:13139::"normal appearance, no masses or tenderness"} Abdomen: soft, non-tender; non distended,  no masses,  no organomegaly Extremities: extremities normal, atraumatic, no cyanosis or edema Skin: Skin color, texture, turgor normal. No rashes or lesions Lymph nodes: Cervical, supraclavicular, and axillary nodes normal. No abnormal inguinal nodes palpated Neurologic: Grossly normal   Pelvic: External genitalia:  no lesions              Urethra:  normal appearing urethra with no masses, tenderness or lesions              Bartholins and Skenes: normal                 Vagina: normal appearing vagina with normal color and discharge, no lesions              Cervix: {CHL AMB PHY EX CERVIX NORM DEFAULT:[id number]::"no lesions"}               Bimanual Exam:  Uterus:  {CHL AMB PHY EX UTERUS NORM [phone]::"normal size, contour, position, consistency, mobility, non-tender"}              Adnexa: {CHL AMB PHY EX ADNEXA NO MASS DEFAULT:[phone]::"no mass, fullness, tenderness"}               Rectovaginal: Confirms               Anus:  normal sphincter tone, no lesions  *** chaperoned for the exam.  A:  Well Woman with normal exam  P:

## 2022-06-02 ENCOUNTER — Telehealth: Payer: Self-pay | Admitting: *Deleted

## 2022-06-02 NOTE — Telephone Encounter (Signed)
Patient was transferred from appointments to triage because she received a reminder call for her annual exam on 06/05/22 medicare will only pay every 2 years. I explained to patient medicare high risk protocol. Patient asked me to cancel visit and she will wait for next year. Appointment canceled.

## 2022-06-05 ENCOUNTER — Ambulatory Visit: Payer: Medicare Other | Admitting: Obstetrics and Gynecology

## 2022-07-03 ENCOUNTER — Ambulatory Visit
Admission: RE | Admit: 2022-07-03 | Discharge: 2022-07-03 | Disposition: A | Discharge status: Home / Self Care | Payer: Medicare Other | Source: Ambulatory Visit | Attending: Internal Medicine | Admitting: Internal Medicine

## 2022-07-03 DIAGNOSIS — Z1231 Encounter for screening mammogram for malignant neoplasm of breast: Secondary | ICD-10-CM

## 2022-08-13 ENCOUNTER — Encounter (HOSPITAL_COMMUNITY): Payer: Self-pay

## 2022-08-13 ENCOUNTER — Ambulatory Visit (HOSPITAL_COMMUNITY)
Admission: EM | Admit: 2022-08-13 | Discharge: 2022-08-13 | Disposition: A | Discharge status: Home / Self Care | Payer: Medicare Other | Attending: Family Medicine | Admitting: Family Medicine

## 2022-08-13 DIAGNOSIS — S51812A Laceration without foreign body of left forearm, initial encounter: Secondary | ICD-10-CM

## 2022-08-13 MED ORDER — AZITHROMYCIN 250 MG PO TABS: ORAL_TABLET | ORAL | 0 refills | Status: DC

## 2022-08-13 MED ORDER — TETANUS-DIPHTH-ACELL PERTUSSIS 5-2.5-18.5 LF-MCG/0.5 IM SUSY
0.5000 mL | PREFILLED_SYRINGE | Freq: Once | INTRAMUSCULAR | Status: AC
Start: 2022-08-13 — End: 2022-08-13
  Administered 2022-08-13: 0.5 mL via INTRAMUSCULAR

## 2022-08-13 MED ORDER — TETANUS-DIPHTH-ACELL PERTUSSIS 5-2.5-18.5 LF-MCG/0.5 IM SUSY
PREFILLED_SYRINGE | INTRAMUSCULAR | Status: AC
  Filled 2022-08-13: qty 0.5

## 2022-08-13 NOTE — Discharge Instructions (Signed)
Azithromycin 250 mg--take 2 orally the first day, then 1 daily for 4 more days.  You are given a Tdap shot to boost your tetanus immunity  Clean the wounds twice daily with soapy water or peroxide, and put new Neosporin on the

## 2022-08-13 NOTE — ED Provider Notes (Signed)
[facility]    CSN: [id number] Arrival date & time: 08/13/22  1234      History   Chief Complaint Chief Complaint  Patient presents with   Animal Bite    HPI [name] is a 76 y.o. female.    Animal Bite  Here for a cat scratch injury to her left forearm.  One of her younger cats was playing with an older cat that was in the patient's arms and lap.  She went to put the younger cat in another area and the cats back foot clawed her left forearm.  The cat is up-to-date on vaccinations.  It has been over 10 years since she had a tetanus shot.  She is not allergic to any antibiotics  Past Medical History:  Diagnosis Date   COPD (chronic obstructive pulmonary disease) ([place])    Glaucoma    History of kidney disease    History of kidney disease 06/11/2019   History of recurrent UTIs    Hyperlipidemia    Hypertension    Lichen sclerosus January 2017   vulva/perianal region   Non-melanoma skin cancer 08/2015   right leg   Osteoporosis    Post-menopausal     Prediabetes     Patient Active Problem List   Diagnosis Date Noted   Vaso vagal episode 12/12/2019   Closed posterior dislocation of right elbow 12/12/2019   Essential hypertension 12/12/2019   History of kidney disease 06/11/2019   Prediabetes    Status post foot surgery 06/27/2013   Other hammer toe (acquired) 06/10/2013   Pain in foot 06/10/2013   Onychomycosis 06/10/2013    Past Surgical History:  Procedure Laterality Date   CLOSED REDUCTION ELBOW FRACTURE Right 12/12/2019   Procedure: CLOSED REDUCTION RIGHT ELBOW;  Surgeon: Shona Needles, MD;  Location: [facility];  Service: Orthopedics;  Laterality: Right;   EYE SURGERY Right    Stent placement   FOOT SURGERY Right 06/2013   right 2nd toe   HEMATOMA EVACUATION Left 2007   left calf - excision of hemangioma with thrombus    OB History     Gravida  6   Para  1   Term  1   Preterm  0   AB  5   Living  1      SAB   5   IAB  0   Ectopic  0   Multiple  0   Live Births  1            Home Medications    Prior to Admission medications   Medication Sig Start Date End Date Taking? Authorizing Provider  azithromycin (ZITHROMAX) 250 MG tablet Take first 2 tablets together, then 1 every day until finished. 08/13/22  Yes Barrett Henle, MD  acetaminophen (TYLENOL 8 HOUR) 650 MG CR tablet Take 1 tablet (650 mg total) by mouth every 8 (eight) hours as needed for pain. 02/11/22   Magnant, Charles L, PA-C  albuterol (VENTOLIN HFA) 108 (90 Base) MCG/ACT inhaler Inhale 2 puffs into the lungs every 6 (six) hours as needed for wheezing or shortness of breath.    [provider]  alendronate (FOSAMAX) 70 MG tablet Take 70 mg by mouth once a week. 02/11/22   [provider]  amLODipine (NORVASC) 5 MG tablet Take 5 mg by mouth daily. 04/25/22   [provider]  aspirin 81 MG tablet Take 81 mg by mouth daily.    [provider]  atorvastatin (LIPITOR) 20 MG tablet Take 1 tablet by mouth at bedtime. 06/26/15   [provider]  budesonide-formoterol (SYMBICORT) 160-4.5 MCG/ACT inhaler Inhale 2 puffs into the lungs daily. 12/02/21   Mannam, Hart Robinsons, MD  calcium carbonate (OS-CAL) 600 MG TABS tablet Take 600 mg by mouth 2 (two) times daily with a meal.    [provider]  chlorthalidone (HYGROTON) 25 MG tablet Take 1 tablet (25 mg total) by mouth daily. 02/13/22 06/13/22  Cantwell, Celeste C, PA-C  Cholecalciferol (VITAMIN D-3) 1000 UNITS CAPS Take 1,000 Units by mouth daily.     [provider]  clobetasol ointment (TEMOVATE) 0.05 % Use a pea sized amount 2 x a week. Increase to 2 x a day for up to 2 weeks as needed 03/24/22   Salvadore Dom, MD  diclofenac Sodium (VOLTAREN) 1 % GEL Apply 2 g topically 4 (four) times daily. 02/11/22   Magnant, Charles L, PA-C  famotidine (PEPCID) 20 MG tablet Take 1 tablet by mouth daily. 07/23/18   [provider]   lidocaine (XYLOCAINE) 5 % ointment Apply 1 application topically 4 (four) times daily as needed. 04/20/21   Salvadore Dom, MD  Misc Natural Products (FOCUSED MIND PO) Take 1 tablet by mouth daily.    [provider]  Multiple Vitamin (MULTIVITAMIN WITH MINERALS) TABS tablet Take 1 tablet by mouth daily.    [provider]  NON FORMULARY Take 2 tablets by mouth daily. MOVE FREE ADVANCED    [provider]  polyethylene glycol (MIRALAX / GLYCOLAX) 17 g packet Take 17 g by mouth daily.    [provider]  triamcinolone cream (KENALOG) 0.1 % Apply 1 Application topically 2 (two) times daily. 04/12/22   Rafoth, Dorian Pod, MD  vitamin B-12 (CYANOCOBALAMIN) 500 MCG tablet Take 500 mcg by mouth daily.    [provider]  zinc gluconate 50 MG tablet Take 50 mg by mouth daily.    [provider]    Family History Family History  Problem Relation Age of Onset   Lung cancer Mother 10   Heart attack Father 43       4 heart attacks   Heart attack Brother 17    Social History Social History   Tobacco Use   Smoking status: Never   Smokeless tobacco: Never  Vaping Use   Vaping Use: Never used  Substance Use Topics   Alcohol use: Not Currently   Drug use: No     Allergies   Contrast media [iodinated contrast media], Ether, and Iohexol   Review of Systems Review of Systems   Physical Exam Triage Vital Signs ED Triage Vitals [08/13/22 1431]  Enc Vitals Group     BP (!) 169/97     Pulse Rate 65     Resp 12     Temp 98 F (36.7 C)     Temp Source Oral     SpO2 96 %     Weight      Height      Head Circumference      Peak Flow      Pain Score      Pain Loc      Pain Edu?      Excl. in [place]?    No data found.  Updated Vital Signs BP (!) 169/97 (BP Location: Left Arm)   Pulse 65   Temp 98 F (36.7 C) (Oral)   Resp 12   LMP 08/14/1985 (Approximate)  SpO2 96%   Visual Acuity Right Eye Distance:   Left Eye  Distance:   Bilateral Distance:    Right Eye Near:   Left Eye Near:    Bilateral Near:     Physical Exam Vitals reviewed.  Constitutional:      General: She is not in acute distress.    Appearance: She is not ill-appearing, toxic-appearing or diaphoretic.  Skin:    Coloration: Skin is not jaundiced or pale.     Comments: There is a laceration in a V shape about 2 cm in total length.  It is on her dorsal surface of her left forearm.  The central part of the laceration is adherent to the wound bed.  There is a linear laceration medial to the V shaped one, that is about 1 cm diameter.  There is no active bleeding.  Neurological:     General: No focal deficit present.     Mental Status: She is alert and oriented to person, place, and time.  Psychiatric:        Behavior: Behavior normal.      UC Treatments / Results  Labs (all labs ordered are listed, but only abnormal results are displayed) Labs Reviewed - No data to display  EKG   Radiology No results found.  Procedures Procedures (including critical care time)  Medications Ordered in UC Medications  Tdap (BOOSTRIX) injection 0.5 mL (has no administration in time range)    Initial Impression / Assessment and Plan / UC Course  I have reviewed the triage vital signs and the nursing notes.  Pertinent labs & imaging results that were available during my care of the patient were reviewed by me and considered in my medical decision making (see chart for details).        Z-Pak is sent to treat and prevent cat scratch infection.  The wound was cleansed with Hibiclens and Povidine.  Bacitracin and bandage was applied.  Wound care is explained.  Tdap is given today Final Clinical Impressions(s) / UC Diagnoses   Final diagnoses:  Laceration of left forearm, initial encounter     Discharge Instructions      Azithromycin 250 mg--take 2 orally the first day, then 1 daily for 4 more days.  You are given a Tdap shot to  boost your tetanus immunity  Clean the wounds twice daily with soapy water or peroxide, and put new Neosporin on the     ED Prescriptions     Medication Sig Dispense Auth. Provider   azithromycin (ZITHROMAX) 250 MG tablet Take first 2 tablets together, then 1 every day until finished. 6 tablet Windy Carina Gwenlyn Perking, MD      PDMP not reviewed this encounter.   Barrett Henle, MD 08/13/22 [phone]

## 2022-08-13 NOTE — ED Triage Notes (Signed)
Pt is here for a cat scratch on the left fore arm today. As per pt stated cat is vaccinated for rabbies

## 2022-11-02 ENCOUNTER — Encounter: Payer: Self-pay | Admitting: Surgical

## 2022-11-02 ENCOUNTER — Telehealth: Payer: Self-pay

## 2022-11-02 ENCOUNTER — Ambulatory Visit: Payer: Medicare Other | Admitting: Surgical

## 2022-11-02 DIAGNOSIS — M1711 Unilateral primary osteoarthritis, right knee: Secondary | ICD-10-CM | POA: Diagnosis not present

## 2022-11-02 DIAGNOSIS — M170 Bilateral primary osteoarthritis of knee: Secondary | ICD-10-CM | POA: Diagnosis not present

## 2022-11-02 MED ORDER — BUPIVACAINE HCL 0.25 % IJ SOLN
4.0000 mL | INTRAMUSCULAR | Status: AC | PRN
  Administered 2022-11-02: 4 mL via INTRA_ARTICULAR

## 2022-11-02 MED ORDER — METHYLPREDNISOLONE ACETATE 40 MG/ML IJ SUSP
40.0000 mg | INTRAMUSCULAR | Status: AC | PRN
  Administered 2022-11-02: 40 mg via INTRA_ARTICULAR

## 2022-11-02 MED ORDER — LIDOCAINE HCL 1 % IJ SOLN
5.0000 mL | INTRAMUSCULAR | Status: AC | PRN
  Administered 2022-11-02: 5 mL

## 2022-11-02 NOTE — Telephone Encounter (Signed)
VOB submitted for Durolane, right knee.  

## 2022-11-02 NOTE — Progress Notes (Signed)
Office Visit Note   Patient: [name]           Date of Birth: [birth year]           MRN: [id number] Visit Date: 11/02/2022 Requested by: Jilda Panda, MD [id number] [place],  [place] [address] PCP: Jilda Panda, MD  Subjective: Chief Complaint  Patient presents with   Right Knee - Pain    HPI: [name] is a 77 y.o. female who presents to the office reporting right knee pain.  Patient states that she has had increased right knee pain over the last several weeks.  She has not been seen in the office in about a year but she has been doing well with her typical knee pain through exercising.  She has joined a Paramedic class that is near her house.  She has been quite pleased with her progress.  Over the last several weeks, she has developed some right knee pain that is not improving with exercise.  Difficult with bending and going up stairs as well as putting pressure on the leg.  She denies any acute injury.  Localizes pain primarily to the medial aspect of the knee as well as the anterior aspect.  No groin pain.  No radicular pain down the leg..                ROS: All systems reviewed are negative as they relate to the chief complaint within the history of present illness.  Patient denies fevers or chills.  Assessment & Plan: Visit Diagnoses:  1. Bilateral primary osteoarthritis of knee     Plan: Patient is a 77 year old female who presents complaining of right knee pain.  She has had increasing pain over the last several weeks and has kept her from exercising.  She has history of right knee osteoarthritis that has historically done well from cortisone injections.  She would like to repeat this today.  She had about 20 cc aspirated from the right knee of nonpurulent synovial fluid.  Tolerated injection well.  We will preapproved her for right knee gel injection which is also done well in the past for her.  Follow-up for gel injection  This  patient is diagnosed with osteoarthritis of the knee(s).    Radiographs show evidence of joint space narrowing, osteophytes, subchondral sclerosis and/or subchondral cysts.  This patient has knee pain which interferes with functional and activities of daily living.    This patient has experienced inadequate response, adverse effects and/or intolerance with conservative treatments such as acetaminophen, NSAIDS, topical creams, physical therapy or regular exercise, knee bracing and/or weight loss.   This patient has experienced inadequate response or has a contraindication to intra articular steroid injections for at least 3 months.   This patient is not scheduled to have a total knee replacement within 6 months of starting treatment with viscosupplementation.   Follow-Up Instructions: No follow-ups on file.   Orders:  No orders of the defined types were placed in this encounter.  No orders of the defined types were placed in this encounter.     Procedures: Large Joint Inj: R knee on 11/02/2022 5:57 PM Indications: diagnostic evaluation, joint swelling and pain Details: 18 G 1.5 in needle, superolateral approach  Arthrogram: No  Medications: 5 mL lidocaine 1 %; 40 mg methylPREDNISolone acetate 40 MG/ML; 4 mL bupivacaine 0.25 % Aspirate: 20 mL Outcome: tolerated well, no immediate complications Procedure, treatment alternatives, risks and benefits explained, specific risks discussed.  Consent was given by the patient. Immediately prior to procedure a time out was called to verify the correct patient, procedure, equipment, support staff and site/side marked as required. Patient was prepped and draped in the usual sterile fashion.       Clinical Data: No additional findings.  Objective: Vital Signs: LMP 08/14/1985 (Approximate)   Physical Exam:  Constitutional: Patient appears well-developed HEENT:  Head: Normocephalic Eyes:EOM are normal Neck: Normal range of  motion Cardiovascular: Normal rate Pulmonary/chest: Effort normal Neurologic: Patient is alert Skin: Skin is warm Psychiatric: Patient has normal mood and affect  Ortho Exam: Ortho exam demonstrates right knee with 3 degrees extension.  Small effusion noted.  She has range of motion that is well-preserved with flexion to 115 degrees.  No calf tenderness.  Negative Homans' sign.  She is able to perform straight leg raise without extensor lag.  Excellent quad strength rated 5/5.  No pain with hip range of motion.  Negative FADIR sign.  Negative Stinchfield sign.  Negative straight leg raise.  Specialty Comments:  No specialty comments available.  Imaging: No results found.   PMFS History: Patient Active Problem List   Diagnosis Date Noted   Vaso vagal episode 12/12/2019   Closed posterior dislocation of right elbow 12/12/2019   Essential hypertension 12/12/2019   History of kidney disease 06/11/2019   Prediabetes    Status post foot surgery 06/27/2013   Other hammer toe (acquired) 06/10/2013   Pain in foot 06/10/2013   Onychomycosis 06/10/2013   Past Medical History:  Diagnosis Date   COPD (chronic obstructive pulmonary disease) ([facility])    Glaucoma    History of kidney disease    History of kidney disease 06/11/2019   History of recurrent UTIs    Hyperlipidemia    Hypertension    Lichen sclerosus January 2017   vulva/perianal region   Non-melanoma skin cancer 08/2015   right leg   Osteoporosis    Post-menopausal     Prediabetes     Family History  Problem Relation Age of Onset   Lung cancer Mother 61   Heart attack Father 23       4 heart attacks   Heart attack Brother 60    Past Surgical History:  Procedure Laterality Date   CLOSED REDUCTION ELBOW FRACTURE Right 12/12/2019   Procedure: CLOSED REDUCTION RIGHT ELBOW;  Surgeon: Shona Needles, MD;  Location: [facility];  Service: Orthopedics;  Laterality: Right;   EYE SURGERY Right    Stent placement   FOOT SURGERY  Right 06/2013   right 2nd toe   HEMATOMA EVACUATION Left 2007   left calf - excision of hemangioma with thrombus   Social History   Occupational History   Not on file  Tobacco Use   Smoking status: Never   Smokeless tobacco: Never  Vaping Use   Vaping Use: Never used  Substance and Sexual Activity   Alcohol use: Not Currently   Drug use: No   Sexual activity: Not Currently    Birth control/protection: Post-menopausal

## 2023-03-15 ENCOUNTER — Other Ambulatory Visit: Payer: Self-pay | Admitting: Internal Medicine

## 2023-03-15 DIAGNOSIS — Z1231 Encounter for screening mammogram for malignant neoplasm of breast: Secondary | ICD-10-CM

## 2023-03-16 NOTE — Progress Notes (Unsigned)
GYNECOLOGY  VISIT   HPI: 77 y.o.   Widowed  Caucasian  female   [phone] with Patient's last menstrual period was 08/14/1985 (approximate).   here for  med check.  Patient is followed for chronic vulvitis and lichen sclerosus, perianal pruritus.  Uses Clobetasol ointment with a flare of pain or itching.  Uses it at least a couple of times a year.   She is avoiding potatoes, rice and starches, which helps her symptoms and reduces pain.   Has constipation.   Retired from [employer].   Raised 6 children.   GYNECOLOGIC HISTORY: Patient's last menstrual period was 08/14/1985 (approximate). Contraception:  PMP Menopausal hormone therapy:  n/a Last mammogram:  07/03/22 Breast Density Cat B, BI-RADS CAT 1 neg Last pap smear:   07/28/16 WNL        OB History     Gravida  6   Para  1   Term  1   Preterm  0   AB  5   Living  1      SAB  5   IAB  0   Ectopic  0   Multiple  0   Live Births  1              Patient Active Problem List   Diagnosis Date Noted   Vaso vagal episode 12/12/2019   Closed posterior dislocation of right elbow 12/12/2019   Essential hypertension 12/12/2019   History of kidney disease 06/11/2019   Prediabetes    Status post foot surgery 06/27/2013   Other hammer toe (acquired) 06/10/2013   Pain in foot 06/10/2013   Onychomycosis 06/10/2013    Past Medical History:  Diagnosis Date   COPD (chronic obstructive pulmonary disease) (HCC)    Glaucoma    History of kidney disease    History of kidney disease 06/11/2019   History of recurrent UTIs    Hyperlipidemia    Hypertension    Lichen sclerosus January 2017   vulva/perianal region   Non-melanoma skin cancer 08/2015   right leg   Osteoporosis    Post-menopausal     Prediabetes     Past Surgical History:  Procedure Laterality Date   CLOSED REDUCTION ELBOW FRACTURE Right 12/12/2019   Procedure: CLOSED REDUCTION RIGHT ELBOW;  Surgeon: Roby Lofts, MD;  Location: [facility] OR;   Service: Orthopedics;  Laterality: Right;   EYE SURGERY Right    Stent placement   FOOT SURGERY Right 06/2013   right 2nd toe   HEMATOMA EVACUATION Left 2007   left calf - excision of hemangioma with thrombus    Current Outpatient Medications  Medication Sig Dispense Refill   acetaminophen (TYLENOL 8 HOUR) 650 MG CR tablet Take 1 tablet (650 mg total) by mouth every 8 (eight) hours as needed for pain. 60 tablet 2   albuterol (VENTOLIN HFA) 108 (90 Base) MCG/ACT inhaler Inhale 2 puffs into the lungs every 6 (six) hours as needed for wheezing or shortness of breath.     alendronate (FOSAMAX) 70 MG tablet Take 70 mg by mouth once a week.     amLODipine (NORVASC) 5 MG tablet Take 5 mg by mouth daily.     aspirin 81 MG tablet Take 81 mg by mouth daily.     atorvastatin (LIPITOR) 20 MG tablet Take 1 tablet by mouth at bedtime.  11   betamethasone valerate lotion (VALISONE) 0.1 % Apply 1 Application topically 2 (two) times daily.     budesonide-formoterol (SYMBICORT)  160-4.5 MCG/ACT inhaler Inhale 2 puffs into the lungs daily. 10.2 g 5   calcium carbonate (OS-CAL) 600 MG TABS tablet Take 600 mg by mouth 2 (two) times daily with a meal.     Cholecalciferol (VITAMIN D-3) 1000 UNITS CAPS Take 1,000 Units by mouth daily.      diclofenac Sodium (VOLTAREN) 1 % GEL Apply 2 g topically 4 (four) times daily. 100 g 2   famotidine (PEPCID) 20 MG tablet Take 1 tablet by mouth daily.     lidocaine (XYLOCAINE) 5 % ointment Apply 1 application topically 4 (four) times daily as needed. 30 g 1   Misc Natural Products (FOCUSED MIND PO) Take 1 tablet by mouth daily.     Multiple Vitamin (MULTIVITAMIN WITH MINERALS) TABS tablet Take 1 tablet by mouth daily.     NON FORMULARY Take 2 tablets by mouth daily. MOVE FREE ADVANCED     nystatin cream (MYCOSTATIN) Apply topically 2 (two) times daily.     triamcinolone cream (KENALOG) 0.1 % Apply 1 Application topically 2 (two) times daily. 30 g 0   vitamin B-12  (CYANOCOBALAMIN) 500 MCG tablet Take 500 mcg by mouth daily.     zinc gluconate 50 MG tablet Take 50 mg by mouth daily.     chlorthalidone (HYGROTON) 25 MG tablet Take 1 tablet (25 mg total) by mouth daily. 30 tablet 3   clobetasol ointment (TEMOVATE) 0.05 % Place on the affected areas twice daily for 2 - 3 weeks and then twice a week at bedtime for maintenance dosing. 60 g 1   No current facility-administered medications for this visit.     ALLERGIES: Contrast media [iodinated contrast media], Ether, and Iohexol  Family History  Problem Relation Age of Onset   Lung cancer Mother 2   Heart attack Father 15       4 heart attacks   Heart attack Brother 58    Social History   Socioeconomic History   Marital status: Widowed    Spouse name: Not on file   Number of children: 1   Years of education: Not on file   Highest education level: Not on file  Occupational History   Not on file  Tobacco Use   Smoking status: Never   Smokeless tobacco: Never  Vaping Use   Vaping status: Never Used  Substance and Sexual Activity   Alcohol use: Not Currently   Drug use: No   Sexual activity: Not Currently    Birth control/protection: Post-menopausal  Other Topics Concern   Not on file  Social History Narrative   Not on file   Social Determinants of Health   Financial Resource Strain: Not on file  Food Insecurity: Not on file  Transportation Needs: Not on file  Physical Activity: Not on file  Stress: Not on file  Social Connections: Unknown (12/16/2021)   Received from [facility]   Social Network    Social Network: Not on file  Intimate Partner Violence: Unknown (12/16/2021)   Received from [facility]   HITS    Physically Hurt: Not on file    Insult or Talk Down To: Not on file    Threaten Physical Harm: Not on file    Scream or Curse: Not on file    Review of Systems  All other systems reviewed and are negative.   PHYSICAL EXAMINATION:     BP 120/84 (BP Location: Right Arm, Patient Position: Sitting, Cuff Size: Normal)   Pulse  63   Ht 5\' 6"  (1.676 m)   Wt 175 lb (79.4 kg)   LMP 08/14/1985 (Approximate)   SpO2 97%   BMI 28.25 kg/m     General appearance: alert, cooperative and appears stated age   Pelvic: External genitalia:  hypopigmentation of the labia minora, perineum and perianal region.  Ecchymoses of the labia minora.  Thickened perianal tissue.               Urethra:  normal appearing urethra with no masses, tenderness or lesions              Bartholins and Skenes: normal                 Vagina: normal appearing vagina with normal color and discharge, no lesions              Cervix: no lesions                Bimanual Exam:  Uterus:  normal size, contour, position, consistency, mobility, non-tender              Adnexa: no mass, fullness, tenderness              Rectal exam: yes.  Confirms.              Anus:  normal sphincter tone, Thickened perianal tissue with skin breakdown.   Chaperone was present for exam:  Warren Lacy, CMA  ASSESSMENT  Lichen sclerosus.  Vulvar and perianal lesions.  Constipation.   PLAN  Patient showed with a mirror the vulvar change she has. Lichen sclerosus discussed.  Clobetasol ointment 0.05% to vulva and perianal region twice daily for 3 weeks and then twice weekly for maintenance dosing.  Return in 3 weeks for vulvar/perianal biopsy.   I discussed Metamucil for constipation.    30 min  total time was spent for this patient encounter, including preparation, face-to-face counseling with the patient, coordination of care, and documentation of the encounter.

## 2023-03-21 ENCOUNTER — Ambulatory Visit (INDEPENDENT_AMBULATORY_CARE_PROVIDER_SITE_OTHER): Payer: Medicare Other | Admitting: Obstetrics and Gynecology

## 2023-03-21 ENCOUNTER — Encounter: Payer: Self-pay | Admitting: Obstetrics and Gynecology

## 2023-03-21 VITALS — BP 120/84 | HR 63 | Ht 66.0 in | Wt 175.0 lb

## 2023-03-21 DIAGNOSIS — K5900 Constipation, unspecified: Secondary | ICD-10-CM

## 2023-03-21 DIAGNOSIS — K629 Disease of anus and rectum, unspecified: Secondary | ICD-10-CM

## 2023-03-21 DIAGNOSIS — N9089 Other specified noninflammatory disorders of vulva and perineum: Secondary | ICD-10-CM

## 2023-03-21 DIAGNOSIS — L900 Lichen sclerosus et atrophicus: Secondary | ICD-10-CM

## 2023-03-21 MED ORDER — CLOBETASOL PROPIONATE 0.05 % EX OINT: TOPICAL_OINTMENT | CUTANEOUS | 1 refills | Status: AC

## 2023-03-21 NOTE — Patient Instructions (Signed)
Vulva Biopsy  A vulva biopsy is a procedure in which a sample of tissue from the vulva is removed and examined under a microscope. The vulva is the outside part of the female genitals. The vulva includes the outside folds of skin (labia majora), the inner lips (labia minora), the clitoris, and the openings of the urethra and vagina. Your health care provider may order this procedure to diagnose a lesion, growth, rash, blister, or other tissue changes. This procedure may also be done to remove a mole or wart. Tell a health care provider about: Any allergies you have. All medicines you are taking, including vitamins, herbs, eye drops, creams, and over-the-counter medicines. Any bleeding problems you have. Any surgeries you have had. Any medical conditions you have. Whether you are pregnant or may be pregnant. What are the risks? Generally, this is a safe procedure. However, problems may occur, including: Infection. Bleeding. Allergic reactions to medicines. Damage to other nearby structures or organs. What happens before the procedure? Medicines You may be given an over-the-counter pain medicine to take right before the procedure. Ask your health care provider about: Changing or stopping your regular medicines. This is especially important if you are taking diabetes medicines or blood thinners. Taking over-the-counter medicines, vitamins, herbs, and supplements. General instructions Do not douche, have sex, use tampons, or put any medicines in your vagina as told by your health care provider. Follow instructions from your health care provider about eating or drinking restrictions. What happens during the procedure? You will undress from the waist down. You will lie on an examining table and put your feet in stirrups. The biopsy site will be cleaned. You will be given a medicine to numb the area (local anesthetic). A small sample of tissue will be removed. This tissue will be sent to a  lab for testing. A medicine may be put on the biopsy site to help stop the bleeding. The biopsy site may be closed with stitches (sutures). The procedure may vary among health care providers and hospitals. What happens after the procedure? You may be given pain medicine. You may be given an antibiotic medicine. It is up to you to get the results of your procedure. Ask your health care provider, or the department that is doing the procedure, when your results will be ready. Summary A vulva biopsy is a procedure in which a sample of tissue from the vulva is removed and examined under a microscope. Your health care provider may order this procedure to diagnose a lesion, growth, rash, blister, or other tissue changes. Generally, this is a safe procedure. However, problems may occur, including infection, bleeding, or damage to nearby structures or organs. Ask your health care provider, or the department that is doing the procedure, when your results will be ready. This information is not intended to replace advice given to you by your health care provider. Make sure you discuss any questions you have with your health care provider. Document Revised: 04/19/2021 Document Reviewed: 04/19/2021 Elsevier Patient Education  2024 [employer].

## 2023-03-27 NOTE — Progress Notes (Signed)
GYNECOLOGY  VISIT   HPI: 77 y.o.   Widowed  Caucasian  female   [phone] with Patient's last menstrual period was 08/14/1985 (approximate).   here for   vulvar bx; feels like cream has helped itching. Using clobetasol ointment.    GYNECOLOGIC HISTORY: Patient's last menstrual period was 08/14/1985 (approximate). Contraception:  PMP Menopausal hormone therapy:  n/a Last mammogram:  07/03/22 Breast Density Cat B, BI-RADS CAT 1 neg Last pap smear:   07/28/16 neg        OB History     Gravida  6   Para  1   Term  1   Preterm  0   AB  5   Living  1      SAB  5   IAB  0   Ectopic  0   Multiple  0   Live Births  1              Patient Active Problem List   Diagnosis Date Noted   Vaso vagal episode 12/12/2019   Closed posterior dislocation of right elbow 12/12/2019   Essential hypertension 12/12/2019   History of kidney disease 06/11/2019   Prediabetes    Status post foot surgery 06/27/2013   Other hammer toe (acquired) 06/10/2013   Pain in foot 06/10/2013   Onychomycosis 06/10/2013    Past Medical History:  Diagnosis Date   COPD (chronic obstructive pulmonary disease) (HCC)    Glaucoma    History of kidney disease    History of kidney disease 06/11/2019   History of recurrent UTIs    Hyperlipidemia    Hypertension    Lichen sclerosus January 2017   vulva/perianal region   Non-melanoma skin cancer 08/2015   right leg   Osteoporosis    Post-menopausal     Prediabetes     Past Surgical History:  Procedure Laterality Date   CLOSED REDUCTION ELBOW FRACTURE Right 12/12/2019   Procedure: CLOSED REDUCTION RIGHT ELBOW;  Surgeon: Roby Lofts, MD;  Location: [facility] OR;  Service: Orthopedics;  Laterality: Right;   EYE SURGERY Right    Stent placement   FOOT SURGERY Right 06/2013   right 2nd toe   HEMATOMA EVACUATION Left 2007   left calf - excision of hemangioma with thrombus    Current Outpatient Medications  Medication Sig Dispense Refill    acetaminophen (TYLENOL 8 HOUR) 650 MG CR tablet Take 1 tablet (650 mg total) by mouth every 8 (eight) hours as needed for pain. 60 tablet 2   albuterol (VENTOLIN HFA) 108 (90 Base) MCG/ACT inhaler Inhale 2 puffs into the lungs every 6 (six) hours as needed for wheezing or shortness of breath.     alendronate (FOSAMAX) 70 MG tablet Take 70 mg by mouth once a week.     amLODipine (NORVASC) 5 MG tablet Take 5 mg by mouth daily.     aspirin 81 MG tablet Take 81 mg by mouth daily.     atorvastatin (LIPITOR) 20 MG tablet Take 1 tablet by mouth at bedtime.  11   betamethasone valerate lotion (VALISONE) 0.1 % Apply 1 Application topically 2 (two) times daily.     budesonide-formoterol (SYMBICORT) 160-4.5 MCG/ACT inhaler Inhale 2 puffs into the lungs daily. 10.2 g 5   calcium carbonate (OS-CAL) 600 MG TABS tablet Take 600 mg by mouth 2 (two) times daily with a meal.     Cholecalciferol (VITAMIN D-3) 1000 UNITS CAPS Take 1,000 Units by mouth daily.  clobetasol ointment (TEMOVATE) 0.05 % Place on the affected areas twice daily for 2 - 3 weeks and then twice a week at bedtime for maintenance dosing. 60 g 1   diclofenac Sodium (VOLTAREN) 1 % GEL Apply 2 g topically 4 (four) times daily. 100 g 2   famotidine (PEPCID) 20 MG tablet Take 1 tablet by mouth daily.     lidocaine (XYLOCAINE) 5 % ointment Apply 1 application topically 4 (four) times daily as needed. 30 g 1   Misc Natural Products (FOCUSED MIND PO) Take 1 tablet by mouth daily.     Multiple Vitamin (MULTIVITAMIN WITH MINERALS) TABS tablet Take 1 tablet by mouth daily.     NON FORMULARY Take 2 tablets by mouth daily. MOVE FREE ADVANCED     nystatin cream (MYCOSTATIN) Apply topically 2 (two) times daily.     triamcinolone cream (KENALOG) 0.1 % Apply 1 Application topically 2 (two) times daily. 30 g 0   vitamin B-12 (CYANOCOBALAMIN) 500 MCG tablet Take 500 mcg by mouth daily.     zinc gluconate 50 MG tablet Take 50 mg by mouth daily.      chlorthalidone (HYGROTON) 25 MG tablet Take 1 tablet (25 mg total) by mouth daily. 30 tablet 3   No current facility-administered medications for this visit.     ALLERGIES: Contrast media [iodinated contrast media], Ether, and Iohexol  Family History  Problem Relation Age of Onset   Lung cancer Mother 58   Heart attack Father 75       4 heart attacks   Heart attack Brother 74    Social History   Socioeconomic History   Marital status: Widowed    Spouse name: Not on file   Number of children: 1   Years of education: Not on file   Highest education level: Not on file  Occupational History   Not on file  Tobacco Use   Smoking status: Never   Smokeless tobacco: Never  Vaping Use   Vaping status: Never Used  Substance and Sexual Activity   Alcohol use: Not Currently   Drug use: No   Sexual activity: Not Currently    Birth control/protection: Post-menopausal  Other Topics Concern   Not on file  Social History Narrative   Not on file   Social Determinants of Health   Financial Resource Strain: Not on file  Food Insecurity: Not on file  Transportation Needs: Not on file  Physical Activity: Not on file  Stress: Not on file  Social Connections: Unknown (12/16/2021)   Received from [facility], [facility]   Social Network    Social Network: Not on file  Intimate Partner Violence: Unknown (12/16/2021)   Received from [facility], [facility]   HITS    Physically Hurt: Not on file    Insult or Talk Down To: Not on file    Threaten Physical Harm: Not on file    Scream or Curse: Not on file    Review of Systems  See HPI.  PHYSICAL EXAMINATION:    Ht 5\' 6"  (1.676 m)   Wt 175 lb (79.4 kg)   LMP 08/14/1985 (Approximate)   BMI 28.25 kg/m     General appearance: alert, cooperative and appears stated age Head: Normocephalic, without obvious abnormality, atraumatic   Vulva with hypopigmentation of labia minora and ecchymoses noted.  Hypopigmentation of labia  majora bilaterally with scattered ecchymoses.  Perianal region with thickened epithelium at 6:00.    Biopsy of right labia minor  and perianal region at 6:00.  Consent done.  Hibiclens prep.  Local 1% lidocaine, lot [id number], exp Jul 2026. 3 mm punch biopsy to each area, specimens to pathology separately.  Silver nitrate to both areas and single suture to right labia minora.  No complications.  Minimal EBL.  Chaperone was present for exam:  Warren Lacy, CMA  ASSESSMENT  Lichen sclerosus.    PLAN  Fu biopsy results.  Anticipate continuation of clobetasol ointment.  Fu for routine exams and prn.

## 2023-04-10 ENCOUNTER — Other Ambulatory Visit (HOSPITAL_COMMUNITY)
Admission: RE | Admit: 2023-04-10 | Discharge: 2023-04-10 | Disposition: A | Discharge status: Home / Self Care | Payer: Medicare Other | Source: Ambulatory Visit | Attending: Obstetrics and Gynecology | Admitting: Obstetrics and Gynecology

## 2023-04-10 ENCOUNTER — Ambulatory Visit: Payer: Medicare Other | Admitting: Obstetrics and Gynecology

## 2023-04-10 ENCOUNTER — Encounter: Payer: Self-pay | Admitting: Obstetrics and Gynecology

## 2023-04-10 VITALS — BP 132/76 | HR 61 | Ht 66.0 in | Wt 175.0 lb

## 2023-04-10 DIAGNOSIS — N9089 Other specified noninflammatory disorders of vulva and perineum: Secondary | ICD-10-CM | POA: Diagnosis present

## 2023-04-10 DIAGNOSIS — K629 Disease of anus and rectum, unspecified: Secondary | ICD-10-CM | POA: Insufficient documentation

## 2023-04-10 NOTE — Patient Instructions (Signed)
Vulva Biopsy, Care After After a vulva biopsy, it is common to have: Slight bleeding from the biopsy site. Soreness or slight pain at the biopsy site. Follow these instructions at home: Your doctor may give you more instructions. If you have problems, contact your doctor. Biopsy site care  Follow instructions from your doctor about how to take care of your biopsy site. Make sure you: Clean the area using water and mild soap two times a day or as told by your doctor. Gently pat the area dry. You may shower 24 hours after the procedure. If you were prescribed an antibiotic ointment, apply it as told by your doctor. Do not stop using the antibiotic even if your condition gets better. If told by your doctor, take a warm water bath (sitz bath) to help with pain and soreness. A sitz bath is taken while you are sitting down. Do this as often as told by your doctor. The water should only come up to your hips and cover your butt. You may pat the area dry with a soft, clean towel. Leave stitches (sutures) or skin glue in place for at least 2 weeks. Leave tape strips alone unless you are told to take them off. You may trim the edges of the tape strips if they curl up. Check your biopsy area every day for signs of infection. It may be helpful to use a handheld mirror to do this. Check for: Redness, swelling, or more pain. More fluid or blood. Warmth. Pus or a bad smell. Do not rub the biopsy area after peeing (urinating). Gently pat the area dry, or use a bottle filled with warm water (peri bottle) to clean the area. Gently wipe from front to back. Lifestyle Wear loose, cotton underwear. Do not wear tight pants. For at least 1 week or until your doctor says it is okay: Do not use a tampon, douche, or put anything in your vagina. Do not have sex. Until your doctor says it is okay: Do not exercise. Do not swim or use a hot tub. General instructions Take over-the-counter and prescription  medicines only as told by your doctor. Drink enough fluid to keep your pee (urine) pale yellow. Use a sanitary pad until the bleeding stops. If told, put ice on the biopsy site. To do this: Place ice in a plastic bag. Place a towel between your skin and the bag. Leave the ice on for 20 minutes, 2-3 times a day. Take off the ice if your skin turns bright red. This is very important. If you cannot feel pain, heat, or cold, you have a greater risk of damage to the area. Keep all follow-up visits. Contact a doctor if: You have redness, swelling, or more pain around your biopsy site. You have more fluid or blood coming from your biopsy site. Your biopsy site feels warm when you touch it. Medicines or ice packs do not help with your pain. You have a fever or chills. Get help right away if: You have a lot of bleeding from the vulva. You have pus or a bad smell coming from your biopsy site. You have pain in your belly (abdomen). Summary After the procedure, it is common to have slight bleeding and soreness at the biopsy site. Follow all instructions as told by your doctor. Take sitz baths as told by your doctor. Leave any stitches in place. Check your biopsy site for infection. Signs include redness, swelling, more pain, more fluid or blood, or warmth. Get help  right away if you have a lot of bleeding, pus or a bad smell, or pain in your belly. This information is not intended to replace advice given to you by your health care provider. Make sure you discuss any questions you have with your health care provider. Document Revised: 04/19/2021 Document Reviewed: 04/19/2021 Elsevier Patient Education  2024 [employer].

## 2023-04-19 ENCOUNTER — Telehealth: Payer: Self-pay | Admitting: *Deleted

## 2023-04-19 NOTE — Telephone Encounter (Signed)
Patient left message requesting f/u call regarding 04/10/23 pathology.    Call placed to patient, left message stating message received, our office will f/u once results received.    Spoke with [place] at [facility]. Was advised pathology was sent to pathologist at [facility] to review.  Routing to Dr. Marjorie Smolder.

## 2023-04-20 LAB — SURGICAL PATHOLOGY

## 2023-04-22 NOTE — Telephone Encounter (Signed)
Please see result note 

## 2023-04-23 NOTE — Telephone Encounter (Signed)
Spoke with patient, advised of results. Patient states she experienced redness and pain after applying the clobetasol ointment. States she noticed painful red raised areas on the perineum and vulva after medication was applied. Stopped using medication on Friday, states symptoms have improved. Asking for alternative Rx. Will review with Dr. Edward Jolly and return call. Patient agreeable.

## 2023-04-23 NOTE — Telephone Encounter (Signed)
Spoke with with patient. Patient states symptoms started at the same time and is unsure if the medication was helping. She will  hold off on restarting Clobetasol until OV. OV scheduled for 9/17 at 1600. Patient aware to call if any additional questions/concerns.   Routing to provider for final review. Patient is agreeable to disposition. Will close encounter.

## 2023-04-23 NOTE — Telephone Encounter (Signed)
Is is possible that the areas were not healed enough from the biopsies before she applied the ointment? Prior to the biopsies, it seems to be helping her symptoms.   I agree with waiting for healing before restarting Clobetasol or other ointment.   How about an office visit with me this week to recheck the areas and plan with her?

## 2023-04-23 NOTE — Telephone Encounter (Signed)
Encounter closed

## 2023-05-01 ENCOUNTER — Encounter: Payer: Self-pay | Admitting: Obstetrics and Gynecology

## 2023-05-01 ENCOUNTER — Ambulatory Visit: Payer: Medicare Other | Admitting: Obstetrics and Gynecology

## 2023-05-01 VITALS — BP 136/78 | HR 62 | Ht 66.0 in | Wt 175.0 lb

## 2023-05-01 DIAGNOSIS — L900 Lichen sclerosus et atrophicus: Secondary | ICD-10-CM | POA: Diagnosis not present

## 2023-05-01 DIAGNOSIS — K5900 Constipation, unspecified: Secondary | ICD-10-CM | POA: Diagnosis not present

## 2023-05-01 DIAGNOSIS — K6289 Other specified diseases of anus and rectum: Secondary | ICD-10-CM

## 2023-05-01 LAB — WET PREP FOR TRICH, YEAST, CLUE

## 2023-05-01 MED ORDER — FLUCONAZOLE 150 MG PO TABS: 150.0000 mg | ORAL_TABLET | Freq: Once | ORAL | 0 refills | Status: AC

## 2023-05-01 NOTE — Progress Notes (Signed)
GYNECOLOGY  VISIT   HPI: 77 y.o.   Widowed  Caucasian  female   [phone] with Patient's last menstrual period was 08/14/1985 (approximate).   here for   2 week follow up.   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   She used the Valisone ointment which caused burning.   No hx HSV I or II.  Not sexually active.   Having constipation.  Took laxative last week and this week.  Tried Miralax and Senakot.  She is having small bowel movements and passing gas but not much result.  States she has a lot going on.  Her cat had a stroke.   GYNECOLOGIC HISTORY: Patient's last menstrual period was 08/14/1985 (approximate). Contraception:  PMP Menopausal hormone therapy:  n/a Last mammogram:   07/03/22 Breast Density Cat B, BI-RADS CAT 1 neg  Last pap smear:   07/28/16 neg         OB History     Gravida  6   Para  1   Term  1   Preterm  0   AB  5   Living  1      SAB  5   IAB  0   Ectopic  0   Multiple  0   Live Births  1              Patient Active Problem List   Diagnosis Date Noted   Vaso vagal episode 12/12/2019   Closed posterior dislocation of right elbow 12/12/2019   Essential hypertension 12/12/2019   History of kidney disease 06/11/2019   Prediabetes    Status post foot surgery 06/27/2013   Other hammer toe (acquired) 06/10/2013   Pain in foot 06/10/2013   Onychomycosis 06/10/2013    Past Medical History:  Diagnosis Date   COPD (chronic obstructive pulmonary disease) (HCC)    Glaucoma    History of kidney disease    History of kidney disease 06/11/2019   History of recurrent UTIs    Hyperlipidemia    Hypertension    Lichen sclerosus January 2017   vulva/perianal region   Non-melanoma skin cancer 08/2015   right leg   Osteoporosis    Post-menopausal     Prediabetes     Past Surgical History:  Procedure Laterality Date   CLOSED REDUCTION ELBOW FRACTURE  Right 12/12/2019   Procedure: CLOSED REDUCTION RIGHT ELBOW;  Surgeon: Roby Lofts, MD;  Location: [facility] OR;  Service: Orthopedics;  Laterality: Right;   EYE SURGERY Right    Stent placement   FOOT SURGERY Right 06/2013   right 2nd toe   HEMATOMA EVACUATION Left 2007   left calf - excision of hemangioma with thrombus    Current Outpatient Medications  Medication Sig Dispense Refill   acetaminophen (TYLENOL 8 HOUR) 650 MG CR tablet Take 1 tablet (650 mg total) by mouth every 8 (eight) hours as needed for pain. 60 tablet 2   albuterol (VENTOLIN HFA) 108 (90 Base) MCG/ACT inhaler Inhale 2 puffs into the lungs every 6 (six) hours as needed for wheezing or shortness of breath.     alendronate (FOSAMAX) 70 MG tablet Take 70 mg by mouth once a week.     amLODipine (NORVASC) 5 MG tablet Take 5 mg by mouth daily.     aspirin 81 MG tablet Take 81 mg by mouth daily.     atorvastatin (LIPITOR) 20 MG tablet Take 1  tablet by mouth at bedtime.  11   betamethasone valerate lotion (VALISONE) 0.1 % Apply 1 Application topically 2 (two) times daily.     budesonide-formoterol (SYMBICORT) 160-4.5 MCG/ACT inhaler Inhale 2 puffs into the lungs daily. 10.2 g 5   calcium carbonate (OS-CAL) 600 MG TABS tablet Take 600 mg by mouth 2 (two) times daily with a meal.     Cholecalciferol (VITAMIN D-3) 1000 UNITS CAPS Take 1,000 Units by mouth daily.      clobetasol ointment (TEMOVATE) 0.05 % Place on the affected areas twice daily for 2 - 3 weeks and then twice a week at bedtime for maintenance dosing. 60 g 1   diclofenac Sodium (VOLTAREN) 1 % GEL Apply 2 g topically 4 (four) times daily. 100 g 2   famotidine (PEPCID) 20 MG tablet Take 1 tablet by mouth daily.     lidocaine (XYLOCAINE) 5 % ointment Apply 1 application topically 4 (four) times daily as needed. 30 g 1   Misc Natural Products (FOCUSED MIND PO) Take 1 tablet by mouth daily.     Multiple Vitamin (MULTIVITAMIN WITH MINERALS) TABS tablet Take 1 tablet by mouth  daily.     NON FORMULARY Take 2 tablets by mouth daily. MOVE FREE ADVANCED     nystatin cream (MYCOSTATIN) Apply topically 2 (two) times daily.     triamcinolone cream (KENALOG) 0.1 % Apply 1 Application topically 2 (two) times daily. 30 g 0   vitamin B-12 (CYANOCOBALAMIN) 500 MCG tablet Take 500 mcg by mouth daily.     zinc gluconate 50 MG tablet Take 50 mg by mouth daily.     chlorthalidone (HYGROTON) 25 MG tablet Take 1 tablet (25 mg total) by mouth daily. 30 tablet 3   No current facility-administered medications for this visit.     ALLERGIES: Contrast media [iodinated contrast media], Ether, and Iohexol  Family History  Problem Relation Age of Onset   Lung cancer Mother 16   Heart attack Father 74       4 heart attacks   Heart attack Brother 79    Social History   Socioeconomic History   Marital status: Widowed    Spouse name: Not on file   Number of children: 1   Years of education: Not on file   Highest education level: Not on file  Occupational History   Not on file  Tobacco Use   Smoking status: Never   Smokeless tobacco: Never  Vaping Use   Vaping status: Never Used  Substance and Sexual Activity   Alcohol use: Not Currently   Drug use: No   Sexual activity: Not Currently    Birth control/protection: Post-menopausal  Other Topics Concern   Not on file  Social History Narrative   Not on file   Social Determinants of Health   Financial Resource Strain: Not on file  Food Insecurity: Not on file  Transportation Needs: Not on file  Physical Activity: Not on file  Stress: Not on file  Social Connections: Unknown (12/16/2021)   Received from [facility], [facility]   Social Network    Social Network: Not on file  Intimate Partner Violence: Unknown (12/16/2021)   Received from [facility], [facility]   HITS    Physically Hurt: Not on file    Insult or Talk Down To: Not on file    Threaten Physical Harm: Not on file    Scream or Curse: Not on file     Review of Systems  All other systems reviewed and are negative.   PHYSICAL EXAMINATION:    Ht 5\' 6"  (1.676 m)   Wt 175 lb (79.4 kg)   LMP 08/14/1985 (Approximate)   BMI 28.25 kg/m     General appearance: alert, cooperative and appears stated age  Pelvic:   Vulva with hypopigmentation of labia minora and ecchymoses noted.  Hypopigmentation of labia majora bilaterally with scattered ecchymoses.  Perianal region with ulcerated area at 6:00 at site of prior biopsy.  Generalized erythema of the perianal region.   Chaperone was present for exam:  Warren Lacy, CMA  ASSESSMENT  Lichen sclerosus.  Perianal inflammation.  I suspect some component of yeast.  Constipation.   PLAN  Pathology report  reviewed.  Wet prep of perianal region:  clue cells?  No yeast.  Diflucan 150 mg po x 1.  May repeat in 72 hours prn.  Sitz baths.  Ok to restart clobetasol in 2 weeks.   We will see if this provides more relief than the Valisone, which may have been applied too soon after her biopsies were done.  She will follow up if she is not improved.  We discussed dietary changes to help with her constipation.  She will try milk of magnesia and follow up with her PCP if she does not get results.   25 min  total time was spent for this patient encounter, including preparation, face-to-face counseling with the patient, coordination of care, and documentation of the encounter.

## 2023-05-27 ENCOUNTER — Ambulatory Visit (HOSPITAL_COMMUNITY)
Admission: EM | Admit: 2023-05-27 | Discharge: 2023-05-27 | Disposition: A | Discharge status: Home / Self Care | Payer: Medicare Other | Attending: Internal Medicine | Admitting: Internal Medicine

## 2023-05-27 ENCOUNTER — Encounter (HOSPITAL_COMMUNITY): Payer: Self-pay

## 2023-05-27 DIAGNOSIS — N3001 Acute cystitis with hematuria: Secondary | ICD-10-CM

## 2023-05-27 LAB — POCT URINALYSIS DIP (MANUAL ENTRY)
Glucose, UA: 100 mg/dL — AB
Ketones, POC UA: NEGATIVE mg/dL
Nitrite, UA: POSITIVE — AB
Protein Ur, POC: 100 mg/dL — AB
Spec Grav, UA: <=1.005 — AB (ref 1.010–1.025)
Urobilinogen, UA: 2 U/dL — AB
pH, UA: 6 (ref 5.0–8.0)

## 2023-05-27 MED ORDER — CEPHALEXIN 500 MG PO CAPS: 500.0000 mg | ORAL_CAPSULE | Freq: Two times a day (BID) | ORAL | 0 refills | Status: AC

## 2023-05-27 NOTE — ED Triage Notes (Signed)
Patient having painful urination, urgency with decreased output onset this past Friday.

## 2023-05-27 NOTE — Discharge Instructions (Signed)

## 2023-05-27 NOTE — ED Provider Notes (Signed)
[facility]    CSN: [id number] Arrival date & time: 05/27/23  1001      History   Chief Complaint Chief Complaint  Patient presents with   Urinary Tract Infection    HPI [name] is a 77 y.o. female.   Patient presents to urgent care for evaluation of urinary frequency, urinary urgency, dysuria, and bladder pressure that started 2 days ago on Friday, May 25, 2023. Reports symptoms started suddenly.  History of recurrent urinary tract infections.  Denies nausea, vomiting, diarrhea, constipation, fever/chills, dizziness, flank pain, lower back pain, and rash. No recent antibiotic/to reduce.  Denies use of SGLT2 inhibitor. Using AZO without relief of symptoms.    Urinary Tract Infection   Past Medical History:  Diagnosis Date   COPD (chronic obstructive pulmonary disease) (HCC)    Glaucoma    History of kidney disease    History of kidney disease 06/11/2019   History of recurrent UTIs    Hyperlipidemia    Hypertension    Lichen sclerosus January 2017   vulva/perianal region   Non-melanoma skin cancer 08/2015   right leg   Osteoporosis    Post-menopausal     Prediabetes     Patient Active Problem List   Diagnosis Date Noted   Vaso vagal episode 12/12/2019   Closed posterior dislocation of right elbow 12/12/2019   Essential hypertension 12/12/2019   History of kidney disease 06/11/2019   Prediabetes    Status post foot surgery 06/27/2013   Other hammer toe (acquired) 06/10/2013   Pain in foot 06/10/2013   Onychomycosis 06/10/2013    Past Surgical History:  Procedure Laterality Date   CLOSED REDUCTION ELBOW FRACTURE Right 12/12/2019   Procedure: CLOSED REDUCTION RIGHT ELBOW;  Surgeon: Roby Lofts, MD;  Location: [facility] OR;  Service: Orthopedics;  Laterality: Right;   EYE SURGERY Right    Stent placement   FOOT SURGERY Right 06/2013   right 2nd toe   HEMATOMA EVACUATION Left 2007   left calf - excision of hemangioma with thrombus     OB History     Gravida  6   Para  1   Term  1   Preterm  0   AB  5   Living  1      SAB  5   IAB  0   Ectopic  0   Multiple  0   Live Births  1            Home Medications    Prior to Admission medications   Medication Sig Start Date End Date Taking? Authorizing Provider  albuterol (VENTOLIN HFA) 108 (90 Base) MCG/ACT inhaler Inhale 2 puffs into the lungs every 6 (six) hours as needed for wheezing or shortness of breath.   Yes [provider]  alendronate (FOSAMAX) 70 MG tablet Take 70 mg by mouth once a week. 02/11/22  Yes [provider]  amLODipine (NORVASC) 5 MG tablet Take 5 mg by mouth daily. 04/25/22  Yes [provider]  aspirin 81 MG tablet Take 81 mg by mouth daily.   Yes [provider]  atorvastatin (LIPITOR) 20 MG tablet Take 1 tablet by mouth at bedtime. 06/26/15  Yes [provider]  betamethasone valerate lotion (VALISONE) 0.1 % Apply 1 Application topically 2 (two) times daily.   Yes [provider]  budesonide-formoterol (SYMBICORT) 160-4.5 MCG/ACT inhaler Inhale 2 puffs into the lungs daily. 12/02/21  Yes Mannam, Colbert Coyer, MD  calcium carbonate (  OS-CAL) 600 MG TABS tablet Take 600 mg by mouth 2 (two) times daily with a meal.   Yes [provider]  cephALEXin (KEFLEX) 500 MG capsule Take 1 capsule (500 mg total) by mouth 2 (two) times daily for 7 days. 05/27/23 06/03/23 Yes Carlisle Beers, FNP  Cholecalciferol (VITAMIN D-3) 1000 UNITS CAPS Take 1,000 Units by mouth daily.    Yes [provider]  clobetasol ointment (TEMOVATE) 0.05 % Place on the affected areas twice daily for 2 - 3 weeks and then twice a week at bedtime for maintenance dosing. 03/21/23  Yes Patton Salles, MD  diclofenac Sodium (VOLTAREN) 1 % GEL Apply 2 g topically 4 (four) times daily. 02/11/22  Yes Magnant, Charles L, PA-C  famotidine (PEPCID) 20 MG tablet Take 1 tablet by mouth daily. 07/23/18   Yes [provider]  lidocaine (XYLOCAINE) 5 % ointment Apply 1 application topically 4 (four) times daily as needed. 04/20/21  Yes Romualdo Bolk, MD  Misc Natural Products (FOCUSED MIND PO) Take 1 tablet by mouth daily.   Yes [provider]  Multiple Vitamin (MULTIVITAMIN WITH MINERALS) TABS tablet Take 1 tablet by mouth daily.   Yes [provider]  NON FORMULARY Take 2 tablets by mouth daily. MOVE FREE ADVANCED   Yes [provider]  nystatin cream (MYCOSTATIN) Apply topically 2 (two) times daily. 03/11/23  Yes [provider]  triamcinolone cream (KENALOG) 0.1 % Apply 1 Application topically 2 (two) times daily. 04/12/22  Yes Rafoth, Baldemar Friday, MD  vitamin B-12 (CYANOCOBALAMIN) 500 MCG tablet Take 500 mcg by mouth daily.   Yes [provider]  zinc gluconate 50 MG tablet Take 50 mg by mouth daily.   Yes [provider]  acetaminophen (TYLENOL 8 HOUR) 650 MG CR tablet Take 1 tablet (650 mg total) by mouth every 8 (eight) hours as needed for pain. 02/11/22   Magnant, Charles L, PA-C  chlorthalidone (HYGROTON) 25 MG tablet Take 1 tablet (25 mg total) by mouth daily. 02/13/22 06/13/22  Cantwell, Renne Musca, PA-C    Family History Family History  Problem Relation Age of Onset   Lung cancer Mother 30   Heart attack Father 66       4 heart attacks   Heart attack Brother 60    Social History Social History   Tobacco Use   Smoking status: Never   Smokeless tobacco: Never  Vaping Use   Vaping status: Never Used  Substance Use Topics   Alcohol use: Not Currently   Drug use: No     Allergies   Contrast media [iodinated contrast media], Ether, and Iohexol   Review of Systems Review of Systems Per HPI  Physical Exam Triage Vital Signs ED Triage Vitals  Encounter Vitals Group     BP 05/27/23 1015 (!) 142/76     Systolic BP Percentile --      Diastolic BP Percentile --      Pulse Rate 05/27/23 1015 70     Resp  05/27/23 1015 18     Temp 05/27/23 1015 98.6 F (37 C)     Temp Source 05/27/23 1015 Oral     SpO2 05/27/23 1015 93 %     Weight 05/27/23 1015 175 lb 0.7 oz (79.4 kg)     Height 05/27/23 1015 5\' 6"  (1.676 m)     Head Circumference --      Peak Flow --      Pain Score  05/27/23 1014 10     Pain Loc --      Pain Education --      Exclude from Growth Chart --    No data found.  Updated Vital Signs BP (!) 142/76 (BP Location: Right Arm)   Pulse 70   Temp 98.6 F (37 C) (Oral)   Resp 18   Ht 5\' 6"  (1.676 m)   Wt 175 lb 0.7 oz (79.4 kg)   LMP 08/14/1985 (Approximate)   SpO2 93%   BMI 28.25 kg/m   Visual Acuity Right Eye Distance:   Left Eye Distance:   Bilateral Distance:    Right Eye Near:   Left Eye Near:    Bilateral Near:     Physical Exam Vitals and nursing note reviewed.  Constitutional:      Appearance: She is not ill-appearing or toxic-appearing.  HENT:     Head: Normocephalic and atraumatic.     Right Ear: Hearing and external ear normal.     Left Ear: Hearing and external ear normal.     Nose: Nose normal.     Mouth/Throat:     Lips: Pink.  Eyes:     General: Lids are normal. Vision grossly intact. Gaze aligned appropriately.     Extraocular Movements: Extraocular movements intact.     Conjunctiva/sclera: Conjunctivae normal.  Cardiovascular:     Rate and Rhythm: Normal rate and regular rhythm.     Heart sounds: Normal heart sounds, S1 normal and S2 normal.  Pulmonary:     Effort: Pulmonary effort is normal. No respiratory distress.     Breath sounds: Normal breath sounds and air entry.  Abdominal:     General: Bowel sounds are normal.     Palpations: Abdomen is soft.     Tenderness: There is no abdominal tenderness. There is no right CVA tenderness, left CVA tenderness or guarding.  Musculoskeletal:     Cervical back: Neck supple.  Skin:    General: Skin is warm and dry.     Capillary Refill: Capillary refill takes less than 2 seconds.      Findings: No rash.  Neurological:     General: No focal deficit present.     Mental Status: She is alert and oriented to person, place, and time. Mental status is at baseline.     Cranial Nerves: No dysarthria or facial asymmetry.  Psychiatric:        Mood and Affect: Mood normal.        Speech: Speech normal.        Behavior: Behavior normal.        Thought Content: Thought content normal.        Judgment: Judgment normal.      UC Treatments / Results  Labs (all labs ordered are listed, but only abnormal results are displayed) Labs Reviewed  POCT URINALYSIS DIP (MANUAL ENTRY) - Abnormal; Notable for the following components:      Result Value   Color, UA orange (*)    Clarity, UA cloudy (*)    Glucose, UA =100 (*)    Bilirubin, UA small (*)    Spec Grav, UA <=1.005 (*)    Blood, UA moderate (*)    Protein Ur, POC =100 (*)    Urobilinogen, UA 2.0 (*)    Nitrite, UA Positive (*)    Leukocytes, UA Large (3+) (*)    All other components within normal limits  URINE CULTURE    EKG   Radiology  No results found.  Procedures Procedures (including critical care time)  Medications Ordered in UC Medications - No data to display  Initial Impression / Assessment and Plan / UC Course  I have reviewed the triage vital signs and the nursing notes.  Pertinent labs & imaging results that were available during my care of the patient were reviewed by me and considered in my medical decision making (see chart for details).   1.  Acute cystitis with hematuria Presentation is consistent with acute uncomplicated cystitis.   Will treat with Keflex antibiotic as prescribed.  Reviewed most recent basic metabolic panel findings showing normal renal function. Urine culture pending. Low suspicion for acute pyelonephritis, kidney stone or infected stone. Appears well hydrated, therefore will defer labs/imaging.  Patient to push fluids to stay well hydrated and reduce intake of known  urinary irritants. Discussed methods of preventing future UTI.   Counseled patient on potential for adverse effects with medications prescribed/recommended today, strict ER and return-to-clinic precautions discussed, patient verbalized understanding.    Final Clinical Impressions(s) / UC Diagnoses   Final diagnoses:  Acute cystitis with hematuria     Discharge Instructions      Your urine shows you likely have a urinary tract infection.  I have sent your urine for culture to confirm this.  We will call you if we need to change your antibiotic when we find out the type of bacteria growing in your bladder.  Take antibiotic as directed with a snack/food to avoid stomach upset. To avoid GI upset please take this medication with food.   Avoid drinking beverages that irritate the urinary tract like sodas, tea, coffee, or juice. Drink plenty of water to stay well hydrated and prevent severe infection.  If you develop any new or worsening symptoms or if your symptoms do not start to improve, pleases return here or follow-up with your primary care provider. If your symptoms are severe, please go to the emergency room.    ED Prescriptions     Medication Sig Dispense Auth. Provider   cephALEXin (KEFLEX) 500 MG capsule Take 1 capsule (500 mg total) by mouth 2 (two) times daily for 7 days. 14 capsule Carlisle Beers, FNP      PDMP not reviewed this encounter.   Carlisle Beers, [place] 05/27/23 1133

## 2023-06-14 ENCOUNTER — Encounter: Payer: Self-pay | Admitting: Surgical

## 2023-06-14 ENCOUNTER — Ambulatory Visit: Payer: Medicare Other | Admitting: Surgical

## 2023-06-14 DIAGNOSIS — M1711 Unilateral primary osteoarthritis, right knee: Secondary | ICD-10-CM | POA: Diagnosis not present

## 2023-06-14 DIAGNOSIS — M170 Bilateral primary osteoarthritis of knee: Secondary | ICD-10-CM

## 2023-06-14 MED ORDER — BUPIVACAINE HCL 0.25 % IJ SOLN
4.0000 mL | INTRAMUSCULAR | Status: AC | PRN
  Administered 2023-06-14: 4 mL via INTRA_ARTICULAR

## 2023-06-14 MED ORDER — LIDOCAINE HCL 1 % IJ SOLN
5.0000 mL | INTRAMUSCULAR | Status: AC | PRN
  Administered 2023-06-14: 5 mL

## 2023-06-14 MED ORDER — METHYLPREDNISOLONE ACETATE 40 MG/ML IJ SUSP
40.0000 mg | INTRAMUSCULAR | Status: AC | PRN
  Administered 2023-06-14: 40 mg via INTRA_ARTICULAR

## 2023-06-14 NOTE — Progress Notes (Signed)
Office Visit Note   Patient: [name]           Date of Birth: [birth year]           MRN: [id number] Visit Date: 06/14/2023 Requested by: Ralene Ok, MD [id number] [place],  [place] [address] PCP: Ralene Ok, MD  Subjective: Chief Complaint  Patient presents with   Right Knee - Pain    HPI: [name] is a 77 y.o. female who presents to the office reporting knee pain.  Has history of knee arthritis.  No new falls or injuries.  No fevers or chills.  Previous injections have provided good relief and they are here today to repeat injection.  Mostly localizes pain to medial aspect of the right knee..                ROS: All systems reviewed are negative as they relate to the chief complaint within the history of present illness.  Patient denies fevers or chills.  Assessment & Plan: Visit Diagnoses:  1. Bilateral primary osteoarthritis of knee     Plan: Patient is a 77 year old female who presents for evaluation of right knee pain.  Has history of right knee osteoarthritis.  Recently had a trip to [place] where she did a lot of walking and this is flared up her knee pain.  She would like to try injection.  This is administered for her after aspiration of 5 cc of nonpurulent synovial fluid.  Plan for her to follow-up with the office as needed if the pain does not improve.  Follow-Up Instructions: No follow-ups on file.   Orders:  No orders of the defined types were placed in this encounter.  No orders of the defined types were placed in this encounter.     Procedures: Large Joint Inj: R knee on 06/14/2023 4:58 PM Indications: diagnostic evaluation, joint swelling and pain Details: 18 G 1.5 in needle, superolateral approach  Arthrogram: No  Medications: 5 mL lidocaine 1 %; 40 mg methylPREDNISolone acetate 40 MG/ML; 4 mL bupivacaine 0.25 % Aspirate: 5 mL Outcome: tolerated well, no immediate complications Procedure, treatment alternatives, risks and  benefits explained, specific risks discussed. Consent was given by the patient. Immediately prior to procedure a time out was called to verify the correct patient, procedure, equipment, support staff and site/side marked as required. Patient was prepped and draped in the usual sterile fashion.       Clinical Data: No additional findings.  Objective: Vital Signs: LMP 08/14/1985 (Approximate)   Physical Exam:  Constitutional: Patient appears well-developed HEENT:  Head: Normocephalic Eyes:EOM are normal Neck: Normal range of motion Cardiovascular: Normal rate Pulmonary/chest: Effort normal Neurologic: Patient is alert Skin: Skin is warm Psychiatric: Patient has normal mood and affect  Ortho Exam: Ortho exam demonstrates knees without cellulitis or skin changes.  Effusion mild.  No calf tenderness.  Negative Homans' sign.  No pain with hip range of motion.  Able to perform straight leg raise with both lower extremities.  Lower extremities warm and well-perfused.  Tenderness over the medial joint line moderately and lateral joint line mildly.  Specialty Comments:  No specialty comments available.  Imaging: No results found.   PMFS History: Patient Active Problem List   Diagnosis Date Noted   Vaso vagal episode 12/12/2019   Closed posterior dislocation of right elbow 12/12/2019   Essential hypertension 12/12/2019   History of kidney disease 06/11/2019   Prediabetes    Status post foot surgery 06/27/2013  Other hammer toe (acquired) 06/10/2013   Pain in foot 06/10/2013   Onychomycosis 06/10/2013   Past Medical History:  Diagnosis Date   COPD (chronic obstructive pulmonary disease) (HCC)    Glaucoma    History of kidney disease    History of kidney disease 06/11/2019   History of recurrent UTIs    Hyperlipidemia    Hypertension    Lichen sclerosus January 2017   vulva/perianal region   Non-melanoma skin cancer 08/2015   right leg   Osteoporosis    Post-menopausal      Prediabetes     Family History  Problem Relation Age of Onset   Lung cancer Mother 37   Heart attack Father 62       4 heart attacks   Heart attack Brother 14    Past Surgical History:  Procedure Laterality Date   CLOSED REDUCTION ELBOW FRACTURE Right 12/12/2019   Procedure: CLOSED REDUCTION RIGHT ELBOW;  Surgeon: Roby Lofts, MD;  Location: [facility];  Service: Orthopedics;  Laterality: Right;   EYE SURGERY Right    Stent placement   FOOT SURGERY Right 06/2013   right 2nd toe   HEMATOMA EVACUATION Left 2007   left calf - excision of hemangioma with thrombus   Social History   Occupational History   Not on file  Tobacco Use   Smoking status: Never   Smokeless tobacco: Never  Vaping Use   Vaping status: Never Used  Substance and Sexual Activity   Alcohol use: Not Currently   Drug use: No   Sexual activity: Not Currently    Birth control/protection: Post-menopausal

## 2023-07-05 ENCOUNTER — Ambulatory Visit
Admission: RE | Admit: 2023-07-05 | Discharge: 2023-07-05 | Disposition: A | Discharge status: Home / Self Care | Payer: Medicare Other | Source: Ambulatory Visit | Attending: Internal Medicine | Admitting: Internal Medicine

## 2023-07-05 DIAGNOSIS — Z1231 Encounter for screening mammogram for malignant neoplasm of breast: Secondary | ICD-10-CM

## 2023-08-10 ENCOUNTER — Ambulatory Visit (HOSPITAL_COMMUNITY)
Admission: EM | Admit: 2023-08-10 | Discharge: 2023-08-10 | Disposition: A | Discharge status: Home / Self Care | Payer: Medicare Other | Attending: Emergency Medicine | Admitting: Emergency Medicine

## 2023-08-10 ENCOUNTER — Encounter (HOSPITAL_COMMUNITY): Payer: Self-pay | Admitting: Emergency Medicine

## 2023-08-10 DIAGNOSIS — J441 Chronic obstructive pulmonary disease with (acute) exacerbation: Secondary | ICD-10-CM | POA: Diagnosis not present

## 2023-08-10 DIAGNOSIS — J069 Acute upper respiratory infection, unspecified: Secondary | ICD-10-CM

## 2023-08-10 LAB — POC COVID19/FLU A&B COMBO
Covid Antigen, POC: NEGATIVE
Influenza A Antigen, POC: NEGATIVE
Influenza B Antigen, POC: NEGATIVE

## 2023-08-10 MED ORDER — DEXAMETHASONE SODIUM PHOSPHATE 10 MG/ML IJ SOLN
INTRAMUSCULAR | Status: AC
  Filled 2023-08-10: qty 1

## 2023-08-10 MED ORDER — DEXAMETHASONE SODIUM PHOSPHATE 10 MG/ML IJ SOLN
10.0000 mg | Freq: Once | INTRAMUSCULAR | Status: AC
  Administered 2023-08-10: 10 mg via INTRAMUSCULAR

## 2023-08-10 MED ORDER — AZITHROMYCIN 250 MG PO TABS: 250.0000 mg | ORAL_TABLET | Freq: Every day | ORAL | 0 refills | Status: AC

## 2023-08-10 NOTE — ED Provider Notes (Signed)
[facility]    CSN: [id number] Arrival date & time: 08/10/23  4098      History   Chief Complaint Chief Complaint  Patient presents with   Hoarse   Cough   Sore Throat    HPI [name] is a 77 y.o. female.   Patient presents to clinic for complaints of sore throat, congestion, voice loss, wheezing, increased sputum production and a cough that have been present since Christmas.  On Sunday she went caroling out in the cold.  Tuesday she went to deliver presents to the nursing home with her church.   She felt cold chills last night, tried to take her temperature but her thermometer is dead and needs batteries.   Has been taking her Symbicort daily for her COPD.  Feels like she is having exacerbation with her wheezing, shortness of breath and increased sputum production.   The history is provided by the patient and medical records.  Cough Sore Throat    Past Medical History:  Diagnosis Date   COPD (chronic obstructive pulmonary disease) (HCC)    Glaucoma    History of kidney disease    History of kidney disease 06/11/2019   History of recurrent UTIs    Hyperlipidemia    Hypertension    Lichen sclerosus January 2017   vulva/perianal region   Non-melanoma skin cancer 08/2015   right leg   Osteoporosis    Post-menopausal     Prediabetes     Patient Active Problem List   Diagnosis Date Noted   Vaso vagal episode 12/12/2019   Closed posterior dislocation of right elbow 12/12/2019   Essential hypertension 12/12/2019   History of kidney disease 06/11/2019   Prediabetes    Status post foot surgery 06/27/2013   Other hammer toe (acquired) 06/10/2013   Pain in foot 06/10/2013   Onychomycosis 06/10/2013    Past Surgical History:  Procedure Laterality Date   CLOSED REDUCTION ELBOW FRACTURE Right 12/12/2019   Procedure: CLOSED REDUCTION RIGHT ELBOW;  Surgeon: Roby Lofts, MD;  Location: [facility] OR;  Service: Orthopedics;  Laterality: Right;    EYE SURGERY Right    Stent placement   FOOT SURGERY Right 06/2013   right 2nd toe   HEMATOMA EVACUATION Left 2007   left calf - excision of hemangioma with thrombus    OB History     Gravida  6   Para  1   Term  1   Preterm  0   AB  5   Living  1      SAB  5   IAB  0   Ectopic  0   Multiple  0   Live Births  1            Home Medications    Prior to Admission medications   Medication Sig Start Date End Date Taking? Authorizing Provider  azithromycin (ZITHROMAX) 250 MG tablet Take 1 tablet (250 mg total) by mouth daily. Take first 2 tablets together, then 1 every day until finished. 08/10/23  Yes Rinaldo Ratel, [place] N, FNP  acetaminophen (TYLENOL 8 HOUR) 650 MG CR tablet Take 1 tablet (650 mg total) by mouth every 8 (eight) hours as needed for pain. 02/11/22   Magnant, Charles L, PA-C  albuterol (VENTOLIN HFA) 108 (90 Base) MCG/ACT inhaler Inhale 2 puffs into the lungs every 6 (six) hours as needed for wheezing or shortness of breath.    [provider]  alendronate (FOSAMAX) 70 MG tablet  Take 70 mg by mouth once a week. 02/11/22   [provider]  amLODipine (NORVASC) 5 MG tablet Take 5 mg by mouth daily. 04/25/22   [provider]  aspirin 81 MG tablet Take 81 mg by mouth daily.    [provider]  atorvastatin (LIPITOR) 20 MG tablet Take 1 tablet by mouth at bedtime. 06/26/15   [provider]  betamethasone valerate lotion (VALISONE) 0.1 % Apply 1 Application topically 2 (two) times daily.    [provider]  budesonide-formoterol (SYMBICORT) 160-4.5 MCG/ACT inhaler Inhale 2 puffs into the lungs daily. 12/02/21   Mannam, Colbert Coyer, MD  calcium carbonate (OS-CAL) 600 MG TABS tablet Take 600 mg by mouth 2 (two) times daily with a meal.    [provider]  chlorthalidone (HYGROTON) 25 MG tablet Take 1 tablet (25 mg total) by mouth daily. 02/13/22 06/13/22  Cantwell, Celeste C, PA-C  Cholecalciferol (VITAMIN D-3)  1000 UNITS CAPS Take 1,000 Units by mouth daily.     [provider]  clobetasol ointment (TEMOVATE) 0.05 % Place on the affected areas twice daily for 2 - 3 weeks and then twice a week at bedtime for maintenance dosing. 03/21/23   Patton Salles, MD  diclofenac Sodium (VOLTAREN) 1 % GEL Apply 2 g topically 4 (four) times daily. 02/11/22   Magnant, Charles L, PA-C  famotidine (PEPCID) 20 MG tablet Take 1 tablet by mouth daily. 07/23/18   [provider]  lidocaine (XYLOCAINE) 5 % ointment Apply 1 application topically 4 (four) times daily as needed. 04/20/21   Romualdo Bolk, MD  Misc Natural Products (FOCUSED MIND PO) Take 1 tablet by mouth daily.    [provider]  Multiple Vitamin (MULTIVITAMIN WITH MINERALS) TABS tablet Take 1 tablet by mouth daily.    [provider]  NON FORMULARY Take 2 tablets by mouth daily. MOVE FREE ADVANCED    [provider]  nystatin cream (MYCOSTATIN) Apply topically 2 (two) times daily. 03/11/23   [provider]  triamcinolone cream (KENALOG) 0.1 % Apply 1 Application topically 2 (two) times daily. 04/12/22   Rafoth, Baldemar Friday, MD  vitamin B-12 (CYANOCOBALAMIN) 500 MCG tablet Take 500 mcg by mouth daily.    [provider]  zinc gluconate 50 MG tablet Take 50 mg by mouth daily.    [provider]    Family History Family History  Problem Relation Age of Onset   Lung cancer Mother 49   Heart attack Father 22       4 heart attacks   Heart attack Brother 84    Social History Social History   Tobacco Use   Smoking status: Never   Smokeless tobacco: Never  Vaping Use   Vaping status: Never Used  Substance Use Topics   Alcohol use: Not Currently   Drug use: No     Allergies   Contrast media [iodinated contrast media], Ether, and Iohexol   Review of Systems Review of Systems  Per HPI   Physical Exam Triage Vital Signs ED Triage Vitals  Encounter Vitals Group      BP 08/10/23 1032 (!) 147/75     Systolic BP Percentile --      Diastolic BP Percentile --      Pulse Rate 08/10/23 1032 60     Resp 08/10/23 1032 18     Temp 08/10/23 1032 (!) 97.3 F (36.3 C)     Temp Source 08/10/23 1032 Oral  SpO2 08/10/23 1032 95 %     Weight --      Height --      Head Circumference --      Peak Flow --      Pain Score 08/10/23 1030 5     Pain Loc --      Pain Education --      Exclude from Growth Chart --    No data found.  Updated Vital Signs BP (!) 147/75 (BP Location: Left Arm)   Pulse 60   Temp (!) 97.3 F (36.3 C) (Oral)   Resp 18   LMP 08/14/1985 (Approximate)   SpO2 95%   Visual Acuity Right Eye Distance:   Left Eye Distance:   Bilateral Distance:    Right Eye Near:   Left Eye Near:    Bilateral Near:     Physical Exam Vitals and nursing note reviewed.  Constitutional:      Appearance: Normal appearance. She is well-developed.  HENT:     Head: Normocephalic and atraumatic.     Right Ear: External ear normal.     Left Ear: External ear normal.     Nose: Nose normal.     Mouth/Throat:     Mouth: Mucous membranes are moist.     Tonsils: No tonsillar exudate or tonsillar abscesses.  Eyes:     Conjunctiva/sclera: Conjunctivae normal.  Cardiovascular:     Rate and Rhythm: Normal rate and regular rhythm.     Heart sounds: Normal heart sounds. No murmur heard. Pulmonary:     Effort: Pulmonary effort is normal. No respiratory distress.     Breath sounds: Normal breath sounds.  Skin:    General: Skin is warm and dry.  Neurological:     General: No focal deficit present.     Mental Status: She is alert and oriented to person, place, and time.  Psychiatric:        Mood and Affect: Mood normal.        Behavior: Behavior normal. Behavior is cooperative.      UC Treatments / Results  Labs (all labs ordered are listed, but only abnormal results are displayed) Labs Reviewed  POC COVID19/FLU A&B COMBO     EKG   Radiology No results found.  Procedures Procedures (including critical care time)  Medications Ordered in UC Medications  dexamethasone (DECADRON) injection 10 mg (has no administration in time range)    Initial Impression / Assessment and Plan / UC Course  I have reviewed the triage vital signs and the nursing notes.  Pertinent labs & imaging results that were available during my care of the patient were reviewed by me and considered in my medical decision making (see chart for details).  Vitals and triage reviewed, patient is hemodynamically stable.  Lungs are vesicular, heart with regular rate and rhythm.  POC COVID and flu testing is negative.  Patient having increased sputum production, subjective shortness of breath and wheezing.  Will cover with IM steroid and azithromycin for COPD exacerbation.  Discussed that symptoms are most likely viral in nature and should improve as URI passes.  Encouraged PCP follow-up if symptoms persist despite treatment.  Plan of care, follow-up care return precautions given, no questions at this time.     Final Clinical Impressions(s) / UC Diagnoses   Final diagnoses:  Viral URI with cough  COPD exacerbation [facility])     Discharge Instructions      I believe your symptoms of voice loss, sore  throat and cough are due to a viral illness.  You tested negative for COVID and flu today in clinic.  Due to your shortness of breath, wheezing and increased sputum production we will cover you for COPD exacerbation with a intramuscular steroid and oral antibiotics.  Take the antibiotics with food.  If no improvement despite these interventions please follow-up with your primary care provider for further evaluation.      ED Prescriptions     Medication Sig Dispense Auth. Provider   azithromycin (ZITHROMAX) 250 MG tablet Take 1 tablet (250 mg total) by mouth daily. Take first 2 tablets together, then 1 every day until finished. 6 tablet  Jerolyn Flenniken, [place] N, [place]      PDMP not reviewed this encounter.   Leaman Abe, [place] N, [place] 08/10/23 [phone]

## 2023-08-10 NOTE — Discharge Instructions (Signed)
I believe your symptoms of voice loss, sore throat and cough are due to a viral illness.  You tested negative for COVID and flu today in clinic.  Due to your shortness of breath, wheezing and increased sputum production we will cover you for COPD exacerbation with a intramuscular steroid and oral antibiotics.  Take the antibiotics with food.  If no improvement despite these interventions please follow-up with your primary care provider for further evaluation.

## 2023-08-10 NOTE — ED Triage Notes (Signed)
Pt started getting sick on 12/25. Called PCP yesterday and was told that doctor will be out of office until after new year. Pt has cough, lost voice and has a sore throat. Reports hx bronchitis and PNA. Usually gets Vit C and PCN shot from doctor that makes her better. Took # mucinex and took one left over antibiotic pill when had covid.

## 2023-10-10 ENCOUNTER — Ambulatory Visit: Payer: Medicare Other | Admitting: Orthopedic Surgery

## 2023-10-10 ENCOUNTER — Other Ambulatory Visit (INDEPENDENT_AMBULATORY_CARE_PROVIDER_SITE_OTHER): Payer: Medicare Other

## 2023-10-10 ENCOUNTER — Telehealth: Payer: Self-pay

## 2023-10-10 DIAGNOSIS — M170 Bilateral primary osteoarthritis of knee: Secondary | ICD-10-CM | POA: Diagnosis not present

## 2023-10-10 NOTE — Telephone Encounter (Signed)
VOB submitted for Durolane, bilateral knee  

## 2023-10-11 ENCOUNTER — Encounter: Payer: Self-pay | Admitting: Orthopedic Surgery

## 2023-10-11 NOTE — Progress Notes (Signed)
 Office Visit Note   Patient: [name]           Date of Birth: 06-29-1946           MRN: [id number] Visit Date: 10/10/2023 Requested by: Melissa Ok, MD [id number] [place],  [place] [address] PCP: Melissa Ok, MD  Subjective: Chief Complaint  Patient presents with   Right Knee - Pain    HPI: [name] is a 78 y.o. female who presents to the office reporting right knee pain.  Generally this is the same type of pain she has had in the past.  She does do Silver sneakers but has trouble doing the stepper machine.  She does exercise at least 3 times a week.  Uses a brace as well.  Also uses a cane.  Is not interested in knee replacement..                ROS: All systems reviewed are negative as they relate to the chief complaint within the history of present illness.  Patient denies fevers or chills.  Assessment & Plan: Visit Diagnoses:  1. Bilateral primary osteoarthritis of knee     Plan: Impression is severe right knee arthritis.  Patient has some degree of left knee arthritis as well but not quite as radiographically severe or clinically severe as the right-hand side.  We will try gel injection for temporary pain relief.  Come back in 3 weeks for that injection. This patient is diagnosed with osteoarthritis of the knee(s).    Radiographs show evidence of joint space narrowing, osteophytes, subchondral sclerosis and/or subchondral cysts.  This patient has knee pain which interferes with functional and activities of daily living.    This patient has experienced inadequate response, adverse effects and/or intolerance with conservative treatments such as acetaminophen, NSAIDS, topical creams, physical therapy or regular exercise, knee bracing and/or weight loss.   This patient has experienced inadequate response or has a contraindication to intra articular steroid injections for at least 3 months.   This patient is not scheduled to have a total knee replacement  within 6 months of starting treatment with viscosupplementation.   Follow-Up Instructions: No follow-ups on file.   Orders:  Orders Placed This Encounter  Procedures   XR KNEE 3 VIEW RIGHT   No orders of the defined types were placed in this encounter.     Procedures: No procedures performed   Clinical Data: No additional findings.  Objective: Vital Signs: LMP 08/14/1985 (Approximate)   Physical Exam:  Constitutional: Patient appears well-developed HEENT:  Head: Normocephalic Eyes:EOM are normal Neck: Normal range of motion Cardiovascular: Normal rate Pulmonary/chest: Effort normal Neurologic: Patient is alert Skin: Skin is warm Psychiatric: Patient has normal mood and affect  Ortho Exam: Ortho exam demonstrates slightly antalgic gait to the right.  No effusion in the right knee.  Does have about a 5 to 10 degree flexion contracture with good patella mobility and extensor mechanism.  No groin pain with internal/external rotation of the leg.  No other masses lymphadenopathy or skin changes noted in that right knee region.  Specialty Comments:  No specialty comments available.  Imaging: No results found.   PMFS History: Patient Active Problem List   Diagnosis Date Noted   Vaso vagal episode 12/12/2019   Closed posterior dislocation of right elbow 12/12/2019   Essential hypertension 12/12/2019   History of kidney disease 06/11/2019   Prediabetes    Status post foot surgery 06/27/2013   Other hammer  toe (acquired) 06/10/2013   Pain in foot 06/10/2013   Onychomycosis 06/10/2013   Past Medical History:  Diagnosis Date   COPD (chronic obstructive pulmonary disease) (HCC)    Glaucoma    History of kidney disease    History of kidney disease 06/11/2019   History of recurrent UTIs    Hyperlipidemia    Hypertension    Lichen sclerosus January 2017   vulva/perianal region   Non-melanoma skin cancer 08/2015   right leg   Osteoporosis    Post-menopausal      Prediabetes     Family History  Problem Relation Age of Onset   Lung cancer Mother 53   Heart attack Father 33       4 heart attacks   Heart attack Brother 22    Past Surgical History:  Procedure Laterality Date   CLOSED REDUCTION ELBOW FRACTURE Right 12/12/2019   Procedure: CLOSED REDUCTION RIGHT ELBOW;  Surgeon: Roby Lofts, MD;  Location: [facility];  Service: Orthopedics;  Laterality: Right;   EYE SURGERY Right    Stent placement   FOOT SURGERY Right 06/2013   right 2nd toe   HEMATOMA EVACUATION Left 2007   left calf - excision of hemangioma with thrombus   Social History   Occupational History   Not on file  Tobacco Use   Smoking status: Never   Smokeless tobacco: Never  Vaping Use   Vaping status: Never Used  Substance and Sexual Activity   Alcohol use: Not Currently   Drug use: No   Sexual activity: Not Currently    Birth control/protection: Post-menopausal

## 2023-11-01 DIAGNOSIS — D649 Anemia, unspecified: Secondary | ICD-10-CM

## 2023-11-01 HISTORY — DX: Anemia, unspecified: D64.9

## 2023-11-02 LAB — LAB REPORT - SCANNED: EGFR (Non-African Amer.): 76

## 2023-11-28 ENCOUNTER — Other Ambulatory Visit: Payer: Self-pay

## 2023-11-28 DIAGNOSIS — M170 Bilateral primary osteoarthritis of knee: Secondary | ICD-10-CM

## 2023-12-03 ENCOUNTER — Encounter: Payer: Self-pay | Admitting: Obstetrics and Gynecology

## 2023-12-14 ENCOUNTER — Ambulatory Visit: Admitting: Orthopedic Surgery

## 2023-12-14 ENCOUNTER — Encounter: Payer: Self-pay | Admitting: Orthopedic Surgery

## 2023-12-14 DIAGNOSIS — M170 Bilateral primary osteoarthritis of knee: Secondary | ICD-10-CM

## 2023-12-14 MED ORDER — LIDOCAINE HCL 1 % IJ SOLN
5.0000 mL | INTRAMUSCULAR | Status: AC | PRN
  Administered 2023-12-14: 5 mL

## 2023-12-14 MED ORDER — SODIUM HYALURONATE 60 MG/3ML IX PRSY
60.0000 mg | PREFILLED_SYRINGE | INTRA_ARTICULAR | Status: AC | PRN
  Administered 2023-12-14: 60 mg via INTRA_ARTICULAR

## 2023-12-14 NOTE — Progress Notes (Signed)
   Procedure Note  Patient: [name]             Date of Birth: 01/08/1946           MRN: [id number]             Visit Date: 12/14/2023  Procedures: Visit Diagnoses:  1. Bilateral primary osteoarthritis of knee     Large Joint Inj: R knee on 12/14/2023 8:40 AM Indications: diagnostic evaluation, joint swelling and pain Details: 18 G 1.5 in needle, superolateral approach  Arthrogram: No  Medications: 5 mL lidocaine  1 %; 60 mg Sodium Hyaluronate 60 MG/3ML Outcome: tolerated well, no immediate complications Procedure, treatment alternatives, risks and benefits explained, specific risks discussed. Consent was given by the patient. Immediately prior to procedure a time out was called to verify the correct patient, procedure, equipment, support staff and site/side marked as required. Patient was prepped and draped in the usual sterile fashion.    Large Joint Inj: L knee on 12/14/2023 8:40 AM Indications: diagnostic evaluation, joint swelling and pain Details: 18 G 1.5 in needle, superolateral approach  Arthrogram: No  Medications: 5 mL lidocaine  1 %; 60 mg Sodium Hyaluronate 60 MG/3ML Outcome: tolerated well, no immediate complications Procedure, treatment alternatives, risks and benefits explained, specific risks discussed. Consent was given by the patient. Immediately prior to procedure a time out was called to verify the correct patient, procedure, equipment, support staff and site/side marked as required. Patient was prepped and draped in the usual sterile fashion.     [id number] lot

## 2023-12-19 ENCOUNTER — Encounter: Payer: Self-pay | Admitting: Internal Medicine

## 2023-12-21 ENCOUNTER — Telehealth: Payer: Self-pay | Admitting: Radiology

## 2023-12-21 NOTE — Telephone Encounter (Signed)
 Patient called and lmovm of Triage, she states that she was in the office on 12/14/23 to get injections in her knees, she states that she is hurting worse now than she was in the office, she would like a call back to advise on what Dr. Rozelle Corning would like her to do about this pain Call back either her home phone @ [phone] or cell at [phone]

## 2023-12-21 NOTE — Telephone Encounter (Signed)
 Called and advised pt. Appointment made

## 2023-12-21 NOTE — Telephone Encounter (Signed)
 Come in Monday with luke for toradol  injection thx

## 2023-12-24 ENCOUNTER — Ambulatory Visit: Admitting: Surgical

## 2023-12-24 DIAGNOSIS — M170 Bilateral primary osteoarthritis of knee: Secondary | ICD-10-CM

## 2023-12-26 ENCOUNTER — Encounter: Payer: Self-pay | Admitting: Surgical

## 2023-12-26 MED ORDER — LIDOCAINE HCL 1 % IJ SOLN
5.0000 mL | INTRAMUSCULAR | Status: AC | PRN
  Administered 2023-12-24: 5 mL

## 2023-12-26 MED ORDER — BUPIVACAINE HCL 0.25 % IJ SOLN
4.0000 mL | INTRAMUSCULAR | Status: AC | PRN
  Administered 2023-12-24: 4 mL via INTRA_ARTICULAR

## 2023-12-26 NOTE — Progress Notes (Signed)
   Procedure Note  Patient: [name]             Date of Birth: 1945-08-20           MRN: [id number]             Visit Date: 12/24/2023  Procedures: Visit Diagnoses:  1. Bilateral primary osteoarthritis of knee     Large Joint Inj: R knee on 12/24/2023 9:33 AM Indications: diagnostic evaluation, joint swelling and pain Details: 18 G 1.5 in needle, superolateral approach  Arthrogram: No  Medications: 5 mL lidocaine  1 %; 4 mL bupivacaine  0.25 % Outcome: tolerated well, no immediate complications  4 cc of Marcaine  mixed with 1 cc Toradol   Patient is here today for Toradol  injection after right knee gel injection caused increased pain in the knee.  Left knee is doing a lot better after gel injection. Procedure, treatment alternatives, risks and benefits explained, specific risks discussed. Consent was given by the patient. Immediately prior to procedure a time out was called to verify the correct patient, procedure, equipment, support staff and site/side marked as required. Patient was prepped and draped in the usual sterile fashion.

## 2024-01-21 ENCOUNTER — Telehealth: Payer: Self-pay | Admitting: Surgical

## 2024-01-21 NOTE — Telephone Encounter (Signed)
 Pt states she was told by [place] to give him a call if the pain in the back of the right knee still persistent after giving her the injection and he would get her in for an xray. Pt states the pain is very unbearable.

## 2024-01-21 NOTE — Telephone Encounter (Signed)
 I would start with XR and re-eval. Right knee OA is a lot worse than left so it's likely this could just be arthritis that didn't improve from injection like the left did.  I don't think MRI would necessarily change anything we do

## 2024-01-24 NOTE — Telephone Encounter (Signed)
 Pt has appt with Norma Beckers on 01/28/2024.

## 2024-01-28 ENCOUNTER — Encounter: Payer: Self-pay | Admitting: Physician Assistant

## 2024-01-28 ENCOUNTER — Other Ambulatory Visit (INDEPENDENT_AMBULATORY_CARE_PROVIDER_SITE_OTHER)

## 2024-01-28 ENCOUNTER — Ambulatory Visit: Admitting: Physician Assistant

## 2024-01-28 DIAGNOSIS — M170 Bilateral primary osteoarthritis of knee: Secondary | ICD-10-CM

## 2024-01-28 DIAGNOSIS — M1711 Unilateral primary osteoarthritis, right knee: Secondary | ICD-10-CM | POA: Insufficient documentation

## 2024-01-28 MED ORDER — METHYLPREDNISOLONE ACETATE 40 MG/ML IJ SUSP
40.0000 mg | INTRAMUSCULAR | Status: AC | PRN
  Administered 2024-01-28: 40 mg via INTRA_ARTICULAR

## 2024-01-28 MED ORDER — LIDOCAINE HCL 1 % IJ SOLN
4.0000 mL | INTRAMUSCULAR | Status: AC | PRN
  Administered 2024-01-28: 4 mL

## 2024-01-28 NOTE — Progress Notes (Signed)
 Office Visit Note   Patient: [name]           Date of Birth: 06-16-1946           MRN: [id number] Visit Date: 01/28/2024              Requested by: [name] [id number] [facility] [name] [place],  [place] [address] PCP: [name]  Right knee pain    HPI: [name] is a pleasant 78 year old woman who normally sees [name] or [name].  She has a history of bilateral osteoarthritis in her knees.  Right is worse than the left.  She had steroid injection late last year as she thought that gave her some relief.  She has also had gel injections which did not seem to help her very much.  More recently on the right knee she had a Toradol  injection.  She comes in today complaining of right knee pain and pain in the back of the knee.  Denies any fever and chills pain is directly behind the kneecap knee no calf pain  Assessment & Plan: Visit Diagnoses:  1. Bilateral primary osteoarthritis of knee   2. Unilateral primary osteoarthritis, right knee     Plan: Findings consistent with osteoarthritis I talked about her options.  Will go forward that the steroid injection today could then follow-up with [name] or [name].  Briefly discussed joint replacement surgery which she has a little bit more consideration than she has in the past  Follow-Up Instructions: Return if symptoms worsen or fail to improve.   Ortho Exam  Patient is alert, oriented, no adenopathy, well-dressed, normal affect, normal respiratory effort. Right knee no erythema no effusion compartments are soft and compressible she is neurovascularly intact no pain with palpation of her calf.  Cannot palpate a cystic structure in the popliteal fossa.  Negative Homans' sign.    Imaging: XR KNEE 3 VIEW RIGHT Result Date: 01/28/2024 Radiographs of her right knee were taken today she has advanced degenerative changes tricompartmental with sclerotic changes especially of the medial compartment with bone-on-bone changes  No images are  attached to the encounter.  Labs: Lab Results  Component Value Date   REPTSTATUS 03/10/2008 FINAL 03/08/2008   CULT  03/08/2008    Multiple bacterial morphotypes present, none predominant. Suggest appropriate recollection if clinically indicated.   LABORGA NO GROWTH 10/06/2015     Lab Results  Component Value Date   ALBUMIN 4.4 10/30/2019    Lab Results  Component Value Date   MG 1.8 12/13/2019   MG 1.9 12/12/2019   Lab Results  Component Value Date   VD25OH 56.06 12/12/2019    No results found for: PREALBUMIN    Latest Ref Rng & Units 10/26/2021   10:40 AM 12/13/2019    2:01 AM 12/11/2019   10:11 PM  CBC EXTENDED  WBC 4.0 - 10.5 K/uL 5.9  3.7  4.0   RBC 3.87 - 5.11 Mil/uL 4.23  3.58  3.86   Hemoglobin 12.0 - 15.0 g/dL 19.1  47.8  29.5   HCT 36.0 - 46.0 % 36.2  33.2  36.0   Platelets 150.0 - 400.0 K/uL 253.0  142  186   NEUT# 1.4 - 7.7 K/uL 4.0   2.2   Lymph# 0.7 - 4.0 K/uL 1.2   1.1      There is no height or weight on file to calculate BMI.  Orders:  Orders Placed This Encounter  Procedures  . XR KNEE 3  VIEW RIGHT   No orders of the defined types were placed in this encounter.    Procedures: Large Joint Inj on 01/28/2024 1:43 PM Indications: pain and diagnostic evaluation Details: 25 G 1.5 in needle, anteromedial approach  Arthrogram: No  Medications: 40 mg methylPREDNISolone  acetate 40 MG/ML; 4 mL lidocaine  1 % Outcome: tolerated well, no immediate complications Procedure, treatment alternatives, risks and benefits explained, specific risks discussed. Consent was given by the patient.    Clinical Data: No additional findings.  ROS:  All other systems negative, except as noted in the HPI. Review of Systems  Objective: Vital Signs: LMP 08/14/1985 (Approximate)   Specialty Comments:  No specialty comments available.  PMFS History: Patient Active Problem List   Diagnosis Date Noted  . Unilateral primary osteoarthritis, right knee  01/28/2024  . Vaso vagal episode 12/12/2019  . Closed posterior dislocation of right elbow 12/12/2019  . Essential hypertension 12/12/2019  . History of kidney disease 06/11/2019  . Prediabetes   . Status post foot surgery 06/27/2013  . Other hammer toe (acquired) 06/10/2013  . Pain in foot 06/10/2013  . Onychomycosis 06/10/2013   Past Medical History:  Diagnosis Date  . Anemia 11/01/2023   Hgb 8.7 - PCP - [facility]  . COPD (chronic obstructive pulmonary disease) (HCC)   . Glaucoma   . History of kidney disease   . History of kidney disease 06/11/2019  . History of recurrent UTIs   . Hyperlipidemia   . Hypertension   . Lichen sclerosus 08/2015   vulva/perianal region  . Non-melanoma skin cancer 08/2015   right leg  . Osteoporosis   . Post-menopausal    . Prediabetes     Family History  Problem Relation Age of Onset  . Lung cancer Mother 72  . Heart attack Father 69       4 heart attacks  . Heart attack Brother 91    Past Surgical History:  Procedure Laterality Date  . CLOSED REDUCTION ELBOW FRACTURE Right 12/12/2019   Procedure: CLOSED REDUCTION RIGHT ELBOW;  Surgeon: Melissa Pintos, [name];  Location: [facility] OR;  Service: Orthopedics;  Laterality: Right;  . EYE SURGERY Right    Stent placement  . FOOT SURGERY Right 06/2013   right 2nd toe  . HEMATOMA EVACUATION Left 2007   left calf - excision of hemangioma with thrombus   Social History   Occupational History  . Not on file  Tobacco Use  . Smoking status: Never  . Smokeless tobacco: Never  Vaping Use  . Vaping status: Never Used  Substance and Sexual Activity  . Alcohol use: Not Currently  . Drug use: No  . Sexual activity: Not Currently    Birth control/protection: Post-menopausal

## 2024-04-20 ENCOUNTER — Other Ambulatory Visit: Payer: Self-pay

## 2024-04-20 ENCOUNTER — Emergency Department (HOSPITAL_BASED_OUTPATIENT_CLINIC_OR_DEPARTMENT_OTHER)
Admission: EM | Admit: 2024-04-20 | Discharge: 2024-04-20 | Discharge status: Against Medical Advice | Attending: Emergency Medicine | Admitting: Emergency Medicine

## 2024-04-20 ENCOUNTER — Encounter (HOSPITAL_BASED_OUTPATIENT_CLINIC_OR_DEPARTMENT_OTHER): Payer: Self-pay

## 2024-04-20 DIAGNOSIS — L299 Pruritus, unspecified: Secondary | ICD-10-CM | POA: Insufficient documentation

## 2024-04-20 DIAGNOSIS — Z5321 Procedure and treatment not carried out due to patient leaving prior to being seen by health care provider: Secondary | ICD-10-CM | POA: Diagnosis not present

## 2024-04-20 DIAGNOSIS — M79662 Pain in left lower leg: Secondary | ICD-10-CM | POA: Diagnosis not present

## 2024-04-20 DIAGNOSIS — M7989 Other specified soft tissue disorders: Secondary | ICD-10-CM | POA: Diagnosis present

## 2024-04-20 NOTE — ED Triage Notes (Signed)
 Pt noticed left lower leg with redness and swelling x 1 day. Pt also report that the area itches and was oozing earlier today. Denies specific injury and/or bite.

## 2024-04-20 NOTE — ED Notes (Signed)
 Per registration, pt left department.

## 2024-04-26 ENCOUNTER — Encounter (HOSPITAL_COMMUNITY): Payer: Self-pay | Admitting: Emergency Medicine

## 2024-04-26 ENCOUNTER — Ambulatory Visit (HOSPITAL_COMMUNITY)
Admission: EM | Admit: 2024-04-26 | Discharge: 2024-04-26 | Disposition: A | Discharge status: Home / Self Care | Attending: Emergency Medicine | Admitting: Emergency Medicine

## 2024-04-26 DIAGNOSIS — L03116 Cellulitis of left lower limb: Secondary | ICD-10-CM | POA: Diagnosis not present

## 2024-04-26 MED ORDER — CEPHALEXIN 500 MG PO CAPS: 500.0000 mg | ORAL_CAPSULE | Freq: Three times a day (TID) | ORAL | 0 refills | Status: AC

## 2024-04-26 NOTE — ED Triage Notes (Signed)
 Pt reports for a week having LLE swelling, redness and heat. Reports last SUnday had oozing on lateral side of leg. Reports went to ED last weekend but left due to wait time. Denies any known injury.  Pt reports had pain Wed and Thursday and had to prob up due to swelling so bad, but pain not that bad today. Reports her preacher told her it looks like cellulitis.  Ppt repots was given Gabapentin  to help with pain so took 3 of them while traveling on bus to [place] .

## 2024-04-26 NOTE — ED Provider Notes (Signed)
 [facility]    CSN: [id number] Arrival date & time: 04/26/24  1224      History   Chief Complaint Chief Complaint  Patient presents with   Leg Swelling    HPI [name] is a 78 y.o. female.   Patient presents with left lower leg swelling, redness, and heat that she began to notice the morning of 9/7.  Patient states that she is unsure if she had a wound or bug bite to this area, but states that she did notice some oozing on the lateral side of her left lower leg on 9/7 as well.  Patient states on 9/7 she did feel like she had a fever as well, denies any fever since.  Patient states that she did go to the emergency department on 9/7 but left due to wait times.   Patient states on 9/8 she traveled to [place]  with her church group for a trip and states that she noticed her swelling had become a little worse over the next few days, but has subsided some over the last few days.  Patient states that one of her friends on the trip gave her gabapentin  which she took with relief of some of her pain.  Denies persistent fever, weakness, body aches, chills, confusion, and difficulty walking.  The history is provided by the patient and medical records.    Past Medical History:  Diagnosis Date   Anemia 11/01/2023   Hgb 8.7 - PCP - [facility]   COPD (chronic obstructive pulmonary disease) (HCC)    Glaucoma    History of kidney disease    History of kidney disease 06/11/2019   History of recurrent UTIs    Hyperlipidemia    Hypertension    Lichen sclerosus 08/2015   vulva/perianal region   Non-melanoma skin cancer 08/2015   right leg   Osteoporosis    Post-menopausal     Prediabetes     Patient Active Problem List   Diagnosis Date Noted   Unilateral primary osteoarthritis, right knee 01/28/2024   Vaso vagal episode 12/12/2019   Closed posterior dislocation of right elbow 12/12/2019   Essential hypertension 12/12/2019   History of kidney disease 06/11/2019    Prediabetes    Status post foot surgery 06/27/2013   Other hammer toe (acquired) 06/10/2013   Pain in foot 06/10/2013   Onychomycosis 06/10/2013    Past Surgical History:  Procedure Laterality Date   CLOSED REDUCTION ELBOW FRACTURE Right 12/12/2019   Procedure: CLOSED REDUCTION RIGHT ELBOW;  Surgeon: Kendal Franky SQUIBB, MD;  Location: [facility];  Service: Orthopedics;  Laterality: Right;   EYE SURGERY Right    Stent placement   FOOT SURGERY Right 06/2013   right 2nd toe   HEMATOMA EVACUATION Left 2007   left calf - excision of hemangioma with thrombus    OB History     Gravida  6   Para  1   Term  1   Preterm  0   AB  5   Living  1      SAB  5   IAB  0   Ectopic  0   Multiple  0   Live Births  1            Home Medications    Prior to Admission medications   Medication Sig Start Date End Date Taking? Authorizing Provider  cephALEXin  (KEFLEX ) 500 MG capsule Take 1 capsule (500 mg total) by mouth 3 (three) times daily  for 5 days. 04/26/24 05/01/24 Yes Johnie, Dorian Duval A, NP  acetaminophen  (TYLENOL  8 HOUR) 650 MG CR tablet Take 1 tablet (650 mg total) by mouth every 8 (eight) hours as needed for pain. 02/11/22   Magnant, Charles L, PA-C  albuterol  (VENTOLIN  HFA) 108 (90 Base) MCG/ACT inhaler Inhale 2 puffs into the lungs every 6 (six) hours as needed for wheezing or shortness of breath.    [provider]  alendronate (FOSAMAX) 70 MG tablet Take 70 mg by mouth once a week. 02/11/22   [provider]  amLODipine (NORVASC) 5 MG tablet Take 5 mg by mouth daily. 04/25/22   [provider]  aspirin 81 MG tablet Take 81 mg by mouth daily.    [provider]  atorvastatin  (LIPITOR) 20 MG tablet Take 1 tablet by mouth at bedtime. 06/26/15   [provider]  azithromycin  (ZITHROMAX ) 250 MG tablet Take 1 tablet (250 mg total) by mouth daily. Take first 2 tablets together, then 1 every day until finished. 08/10/23   Dreama, [place]  N,  FNP  betamethasone  valerate lotion (VALISONE ) 0.1 % Apply 1 Application topically 2 (two) times daily.    [provider]  budesonide -formoterol  (SYMBICORT ) 160-4.5 MCG/ACT inhaler Inhale 2 puffs into the lungs daily. 12/02/21   Mannam, Praveen, MD  calcium  carbonate (OS-CAL) 600 MG TABS tablet Take 600 mg by mouth 2 (two) times daily with a meal.    [provider]  chlorthalidone  (HYGROTON ) 25 MG tablet Take 1 tablet (25 mg total) by mouth daily. 02/13/22 06/13/22  Cantwell, Celeste C, PA-C  Cholecalciferol (VITAMIN D-3) 1000 UNITS CAPS Take 1,000 Units by mouth daily.     [provider]  clobetasol  ointment (TEMOVATE ) 0.05 % Place on the affected areas twice daily for 2 - 3 weeks and then twice a week at bedtime for maintenance dosing. 03/21/23   Cathlyn JAYSON Nikki Bobie FORBES, MD  diclofenac  Sodium (VOLTAREN ) 1 % GEL Apply 2 g topically 4 (four) times daily. 02/11/22   Magnant, Carlin CROME, PA-C  famotidine  (PEPCID ) 20 MG tablet Take 1 tablet by mouth daily. 07/23/18   [provider]  lidocaine  (XYLOCAINE ) 5 % ointment Apply 1 application topically 4 (four) times daily as needed. 04/20/21   Jertson, Jill Evelyn, MD  Misc Natural Products (FOCUSED MIND PO) Take 1 tablet by mouth daily.    [provider]  Multiple Vitamin (MULTIVITAMIN WITH MINERALS) TABS tablet Take 1 tablet by mouth daily.    [provider]  NON FORMULARY Take 2 tablets by mouth daily. MOVE FREE ADVANCED    [provider]  nystatin  cream (MYCOSTATIN ) Apply topically 2 (two) times daily. 03/11/23   [provider]  triamcinolone  cream (KENALOG ) 0.1 % Apply 1 Application topically 2 (two) times daily. 04/12/22   Rafoth, Katrinka ORN, MD  vitamin B-12 (CYANOCOBALAMIN) 500 MCG tablet Take 500 mcg by mouth daily.    [provider]  zinc gluconate 50 MG tablet Take 50 mg by mouth daily.    [provider]    Family History Family History  Problem Relation  Age of Onset   Lung cancer Mother 75   Heart attack Father 78       4 heart attacks   Heart attack Brother 42    Social History Social History   Tobacco Use   Smoking status: Never   Smokeless tobacco: Never  Vaping Use   Vaping status: Never Used  Substance Use Topics   Alcohol  use: Not Currently   Drug use: No     Allergies   Contrast media [iodinated contrast media], Ether, and Iohexol   Review of Systems Review of Systems  Per HPI  Physical Exam Triage Vital Signs ED Triage Vitals  Encounter Vitals Group     BP 04/26/24 1324 (!) 171/82     Girls Systolic BP Percentile --      Girls Diastolic BP Percentile --      Boys Systolic BP Percentile --      Boys Diastolic BP Percentile --      Pulse Rate 04/26/24 1324 61     Resp 04/26/24 1324 19     Temp 04/26/24 1324 97.7 F (36.5 C)     Temp src --      SpO2 04/26/24 1324 96 %     Weight --      Height --      Head Circumference --      Peak Flow --      Pain Score 04/26/24 1322 0     Pain Loc --      Pain Education --      Exclude from Growth Chart --    No data found.  Updated Vital Signs BP (!) 171/82 (BP Location: Left Arm)   Pulse 61   Temp 97.7 F (36.5 C)   Resp 19   LMP 08/14/1985 (Approximate)   SpO2 96%   Visual Acuity Right Eye Distance:   Left Eye Distance:   Bilateral Distance:    Right Eye Near:   Left Eye Near:    Bilateral Near:     Physical Exam Vitals and nursing note reviewed.  Constitutional:      General: She is awake. She is not in acute distress.    Appearance: Normal appearance. She is well-developed and well-groomed. She is not ill-appearing.  Cardiovascular:     Rate and Rhythm: Normal rate and regular rhythm.     Pulses:          Popliteal pulses are 2+ on the left side.       Dorsalis pedis pulses are 2+ on the left side.       Posterior tibial pulses are 2+ on the left side.  Pulmonary:     Effort: Pulmonary effort is normal.     Breath sounds: Normal  breath sounds.  Musculoskeletal:     Left lower leg: 1+ Edema present.  Skin:    Findings: Erythema present.     Comments: Erythema and warmth noted to anterior left lower leg.  +1 nonpitting edema noted to left lower leg  Neurological:     Mental Status: She is alert.  Psychiatric:        Behavior: Behavior is cooperative.      UC Treatments / Results  Labs (all labs ordered are listed, but only abnormal results are displayed) Labs Reviewed - No data to display  EKG   Radiology No results found.  Procedures Procedures (including critical care time)  Medications Ordered in UC Medications - No data to display  Initial Impression / Assessment and Plan / UC Course  I have reviewed the triage vital signs and the nursing notes.  Pertinent labs & imaging results that were available during my care of the patient were reviewed by me and considered in my medical decision making (see chart for details).     Patient is overall well-appearing.  Vitals are stable.  Exam findings appear to be  consistent with cellulitis.  -2 on Wells criteria for DVT due to lack of significant calf swelling, pitting edema, and no previous history of DVT.  Prescribed cephalexin  for cellulitis coverage.  Discussed follow-up, return, and strict ER precautions. Final Clinical Impressions(s) / UC Diagnoses   Final diagnoses:  Cellulitis of left lower leg     Discharge Instructions      Start taking cephalexin  3 times daily for 5 days for cellulitis coverage. Rest elevate your legs throughout the day. You can take 500 to 1000 mg of Tylenol  every 6-8 hours as needed for pain.  Do not exceed 4000 mg in 1 day. Follow-up with your primary care provider or return here as needed If you develop worsening swelling, pain, spreading of redness, develop fever, weakness, or confusion please seek immediate medical treatment in the emergency department.   ED Prescriptions     Medication Sig Dispense Auth.  Provider   cephALEXin  (KEFLEX ) 500 MG capsule Take 1 capsule (500 mg total) by mouth 3 (three) times daily for 5 days. 15 capsule Johnie Flaming A, NP      PDMP not reviewed this encounter.   Johnie Flaming A, NP 04/26/24 1406

## 2024-04-26 NOTE — Discharge Instructions (Signed)
 Start taking cephalexin  3 times daily for 5 days for cellulitis coverage. Rest elevate your legs throughout the day. You can take 500 to 1000 mg of Tylenol  every 6-8 hours as needed for pain.  Do not exceed 4000 mg in 1 day. Follow-up with your primary care provider or return here as needed If you develop worsening swelling, pain, spreading of redness, develop fever, weakness, or confusion please seek immediate medical treatment in the emergency department.

## 2024-06-03 ENCOUNTER — Other Ambulatory Visit: Payer: Self-pay | Admitting: Internal Medicine

## 2024-06-03 DIAGNOSIS — Z1231 Encounter for screening mammogram for malignant neoplasm of breast: Secondary | ICD-10-CM

## 2024-07-07 ENCOUNTER — Ambulatory Visit
Admission: RE | Admit: 2024-07-07 | Discharge: 2024-07-07 | Disposition: A | Discharge status: Home / Self Care | Source: Ambulatory Visit | Attending: Internal Medicine | Admitting: Internal Medicine

## 2024-07-07 DIAGNOSIS — Z1231 Encounter for screening mammogram for malignant neoplasm of breast: Secondary | ICD-10-CM

## 2024-07-08 ENCOUNTER — Other Ambulatory Visit: Payer: Self-pay

## 2024-07-08 DIAGNOSIS — L900 Lichen sclerosus et atrophicus: Secondary | ICD-10-CM

## 2024-07-08 NOTE — Telephone Encounter (Signed)
 Med refill request: clobetasol  ointment and nystatin  cream Last AEX: 05/25/21 ML Next AEX: not scheduled Last MMG (if hormonal med) 07/07/24 Refill authorized:  Last Rx (clobetasol ) sent #60g with 1 refill on 03/21/23 BS. Please Advise?

## 2024-07-20 ENCOUNTER — Other Ambulatory Visit: Payer: Self-pay | Admitting: Family Medicine

## 2024-09-09 NOTE — Progress Notes (Unsigned)
 "  79 y.o. [id number] postmenopausal female here for annual exam. Widowed. PCP: Valma Carwin, MD   She reports ***. Urine sample provided: ***  Postmenopausal bleeding: *** Pelvic discharge or pain: *** Breast mass, nipple discharge or skin changes : *** Sexually active: ***   Last PAP: No results found for: DIAGPAP, HPVHIGH, ADEQPAP Last mammogram: 07/07/24 BI-RADS 1, Density B Last DXA: 07/18/18 *** Last colonoscopy: 07/29/09 ***  Exercising: *** Smoker:***       GYN HISTORY: ***  OB History  Gravida Para Term Preterm AB Living  6 1 1  0 5 1  SAB IAB Ectopic Multiple Live Births  5 0 0 0 1    # Outcome Date GA Lbr Len/2nd Weight Sex Type Anes PTL Lv  6 Term 90    M Vag-Spont   LIV  5 SAB           4 SAB           3 SAB           2 SAB           1 SAB            Past Medical History:  Diagnosis Date   Anemia 11/01/2023   Hgb 8.7 - PCP - [facility]   COPD (chronic obstructive pulmonary disease) (HCC)    Glaucoma    History of kidney disease    History of kidney disease 06/11/2019   History of recurrent UTIs    Hyperlipidemia    Hypertension    Lichen sclerosus 08/2015   vulva/perianal region   Non-melanoma skin cancer 08/2015   right leg   Osteoporosis    Post-menopausal     Prediabetes    Past Surgical History:  Procedure Laterality Date   CLOSED REDUCTION ELBOW FRACTURE Right 12/12/2019   Procedure: CLOSED REDUCTION RIGHT ELBOW;  Surgeon: Kendal Franky SQUIBB, MD;  Location: [facility];  Service: Orthopedics;  Laterality: Right;   EYE SURGERY Right    Stent placement   FOOT SURGERY Right 06/2013   right 2nd toe   HEMATOMA EVACUATION Left 2007   left calf - excision of hemangioma with thrombus   Medications Ordered Prior to Encounter[1] Social History   Socioeconomic History   Marital status: Widowed    Spouse name: Not on file   Number of children: 1   Years of education: Not on file   Highest education level: Not on file  Occupational History    Not on file  Tobacco Use   Smoking status: Never   Smokeless tobacco: Never  Vaping Use   Vaping status: Never Used  Substance and Sexual Activity   Alcohol use: Not Currently   Drug use: No   Sexual activity: Not Currently    Birth control/protection: Post-menopausal  Other Topics Concern   Not on file  Social History Narrative   Not on file   Social Drivers of Health   Tobacco Use: Low Risk (04/20/2024)   Patient History    Smoking Tobacco Use: Never    Smokeless Tobacco Use: Never    Passive Exposure: Not on file  Financial Resource Strain: Not on file  Food Insecurity: Not on file  Transportation Needs: Not on file  Physical Activity: Not on file  Stress: Not on file  Social Connections: Unknown (12/16/2021)   [facility]   Social Network    Social Network: Not on file  Intimate Partner Violence: Unknown (12/16/2021)   Received  from [facility]   HITS    Physically Hurt: Not on file    Insult or Talk Down To: Not on file    Threaten Physical Harm: Not on file    Scream or Curse: Not on file  Depression (PHQ2-9): Not on file  Alcohol Screen: Not on file  Housing: Not on file  Utilities: Not on file  Health Literacy: Not on file   Family History  Problem Relation Age of Onset   Lung cancer Mother 12   Heart attack Father 74       4 heart attacks   Heart attack Brother 9   Allergies[2]    PE There were no vitals filed for this visit. There is no height or weight on file to calculate BMI.  Physical Exam    Assessment and Plan:        There are no diagnoses linked to this encounter. Clotilda FORBES Pa, CMA      [1]  Current Outpatient Medications on File Prior to Visit  Medication Sig Dispense Refill   acetaminophen  (TYLENOL  8 HOUR) 650 MG CR tablet Take 1 tablet (650 mg total) by mouth every 8 (eight) hours as needed for pain. 60 tablet 2   albuterol  (VENTOLIN  HFA) 108 (90 Base) MCG/ACT inhaler Inhale 2 puffs into the lungs every 6  (six) hours as needed for wheezing or shortness of breath.     alendronate (FOSAMAX) 70 MG tablet Take 70 mg by mouth once a week.     amLODipine (NORVASC) 5 MG tablet Take 5 mg by mouth daily.     aspirin 81 MG tablet Take 81 mg by mouth daily.     atorvastatin  (LIPITOR) 20 MG tablet Take 1 tablet by mouth at bedtime.  11   azithromycin  (ZITHROMAX ) 250 MG tablet Take 1 tablet (250 mg total) by mouth daily. Take first 2 tablets together, then 1 every day until finished. 6 tablet 0   betamethasone  valerate lotion (VALISONE ) 0.1 % Apply 1 Application topically 2 (two) times daily.     budesonide -formoterol  (SYMBICORT ) 160-4.5 MCG/ACT inhaler Inhale 2 puffs into the lungs daily. 10.2 g 5   calcium  carbonate (OS-CAL) 600 MG TABS tablet Take 600 mg by mouth 2 (two) times daily with a meal.     chlorthalidone  (HYGROTON ) 25 MG tablet Take 1 tablet (25 mg total) by mouth daily. 30 tablet 3   Cholecalciferol (VITAMIN D-3) 1000 UNITS CAPS Take 1,000 Units by mouth daily.      clobetasol  ointment (TEMOVATE ) 0.05 % Place on the affected areas twice daily for 2 - 3 weeks and then twice a week at bedtime for maintenance dosing. 60 g 1   diclofenac  Sodium (VOLTAREN ) 1 % GEL Apply 2 g topically 4 (four) times daily. 100 g 2   famotidine  (PEPCID ) 20 MG tablet Take 1 tablet by mouth daily.     lidocaine  (XYLOCAINE ) 5 % ointment Apply 1 application topically 4 (four) times daily as needed. 30 g 1   Misc Natural Products (FOCUSED MIND PO) Take 1 tablet by mouth daily.     Multiple Vitamin (MULTIVITAMIN WITH MINERALS) TABS tablet Take 1 tablet by mouth daily.     NON FORMULARY Take 2 tablets by mouth daily. MOVE FREE ADVANCED     nystatin  cream (MYCOSTATIN ) Apply topically 2 (two) times daily.     triamcinolone  cream (KENALOG ) 0.1 % Apply 1 Application topically 2 (two) times daily. 30 g 0   vitamin B-12 (CYANOCOBALAMIN) 500 MCG  tablet Take 500 mcg by mouth daily.     zinc gluconate 50 MG tablet Take 50 mg by mouth  daily.     No current facility-administered medications on file prior to visit.  [2]  Allergies Allergen Reactions   Contrast Media [Iodinated Contrast Media] Hives   Ether Other (See Comments)    hallucinations   Iohexol      Code: HIVES, Desc: pt had iv contrast for the first time today. doing a test dose w/ an miroi, 20cc contrast, pt immediately broke out in hives, itching, swelling of lips and tongue.  Check w/ rad concerning recommendations.50 mg of benedryl given po per dr minus., Onset Date: [id number]    "

## 2024-09-10 ENCOUNTER — Ambulatory Visit: Admitting: Orthopedic Surgery

## 2024-09-10 ENCOUNTER — Encounter: Payer: Self-pay | Admitting: Obstetrics and Gynecology

## 2024-09-10 DIAGNOSIS — M170 Bilateral primary osteoarthritis of knee: Secondary | ICD-10-CM

## 2024-09-10 NOTE — Progress Notes (Signed)
 "  Office Visit Note   Patient: [name]           Date of Birth: February 13, 1946           MRN: [id number] Visit Date: 09/10/2024 Requested by: Valma Carwin, MD [id number] [place],  [place] [address] PCP: Valma Carwin, MD  Subjective: Chief Complaint  Patient presents with   Right Knee - Pain   Left Knee - Pain    HPI: RAMONIA [name] is a 79 y.o. female who presents to the office reporting left knee pain.  Patient has known severe left knee arthritis.  Also reports right knee pain and has arthritis in that knee as well.  Patient is not diabetic.  Pain comes and goes.  Has history of positive result with knee injections in the past..                ROS: All systems reviewed are negative as they relate to the chief complaint within the history of present illness.  Patient denies fevers or chills.  Assessment & Plan: Visit Diagnoses:  1. Bilateral primary osteoarthritis of knee     Plan: Impression bilateral knee arthritis.  Bilateral cortisone injections performed today.  We will see her back in 4 months for clinical recheck.  Not really interested in knee replacement.  Follow-Up Instructions: No follow-ups on file.   Orders:  No orders of the defined types were placed in this encounter.  No orders of the defined types were placed in this encounter.     Procedures: Large Joint Inj: bilateral knee on 09/10/2024 2:10 PM Indications: diagnostic evaluation, joint swelling and pain Details: 18 G 1.5 in needle, superolateral approach  Arthrogram: No  Medications (Right): 5 mL lidocaine  1 %; 4 mL bupivacaine  0.25 %; 40 mg triamcinolone  acetonide 40 MG/ML Medications (Left): 5 mL lidocaine  1 %; 4 mL bupivacaine  0.25 %; 40 mg triamcinolone  acetonide 40 MG/ML Outcome: tolerated well, no immediate complications Procedure, treatment alternatives, risks and benefits explained, specific risks discussed. Consent was given by the patient. Immediately prior to procedure a time  out was called to verify the correct patient, procedure, equipment, support staff and site/side marked as required. Patient was prepped and draped in the usual sterile fashion.       Clinical Data: No additional findings.  Objective: Vital Signs: LMP 08/14/1985   Physical Exam:  Constitutional: Patient appears well-developed HEENT:  Head: Normocephalic Eyes:EOM are normal Neck: Normal range of motion Cardiovascular: Normal rate Pulmonary/chest: Effort normal Neurologic: Patient is alert Skin: Skin is warm Psychiatric: Patient has normal mood and affect  Ortho Exam: Ortho exam demonstrates range of motion of both knees of 10-100.  No effusion in either knee.  Has a little bit of increased fluid in the lower extremities.  Pedal pulses palpable.  Ankle dorsiflexion intact.  No other masses lymphadenopathy or skin changes noted in either knee region.  Specialty Comments:  No specialty comments available.  Imaging: No results found.   PMFS History: Patient Active Problem List   Diagnosis Date Noted   Unilateral primary osteoarthritis, right knee 01/28/2024   Vaso vagal episode 12/12/2019   Closed posterior dislocation of right elbow 12/12/2019   Essential hypertension 12/12/2019   History of kidney disease 06/11/2019   Prediabetes    Status post foot surgery 06/27/2013   Other hammer toe (acquired) 06/10/2013   Pain in foot 06/10/2013   Onychomycosis 06/10/2013   Past Medical History:  Diagnosis Date   Anemia 11/01/2023  Hgb 8.7 - PCP - [facility]   COPD (chronic obstructive pulmonary disease) (HCC)    Glaucoma    History of kidney disease    History of kidney disease 06/11/2019   History of recurrent UTIs    Hyperlipidemia    Hypertension    Lichen sclerosus 08/2015   vulva/perianal region   Non-melanoma skin cancer 08/2015   right leg   Osteoporosis    Post-menopausal     Prediabetes     Family History  Problem Relation Age of Onset   Lung cancer  Mother 66   Heart attack Father 108       4 heart attacks   Heart attack Brother 71    Past Surgical History:  Procedure Laterality Date   CLOSED REDUCTION ELBOW FRACTURE Right 12/12/2019   Procedure: CLOSED REDUCTION RIGHT ELBOW;  Surgeon: Kendal Franky SQUIBB, MD;  Location: [facility] OR;  Service: Orthopedics;  Laterality: Right;   EYE SURGERY Right    Stent placement   FOOT SURGERY Right 06/2013   right 2nd toe   HEMATOMA EVACUATION Left 2007   left calf - excision of hemangioma with thrombus   Social History   Occupational History   Not on file  Tobacco Use   Smoking status: Never   Smokeless tobacco: Never  Vaping Use   Vaping status: Never Used  Substance and Sexual Activity   Alcohol use: Not Currently   Drug use: No   Sexual activity: Not Currently    Birth control/protection: Post-menopausal        "

## 2024-09-12 ENCOUNTER — Encounter: Payer: Self-pay | Admitting: Obstetrics and Gynecology

## 2024-09-12 ENCOUNTER — Ambulatory Visit: Payer: Self-pay | Admitting: Obstetrics and Gynecology

## 2024-09-12 ENCOUNTER — Encounter: Payer: Self-pay | Admitting: Orthopedic Surgery

## 2024-09-12 VITALS — BP 124/62 | HR 61 | Temp 97.9°F | Ht 64.0 in | Wt 169.0 lb

## 2024-09-12 DIAGNOSIS — Z1331 Encounter for screening for depression: Secondary | ICD-10-CM

## 2024-09-12 DIAGNOSIS — Z01419 Encounter for gynecological examination (general) (routine) without abnormal findings: Secondary | ICD-10-CM | POA: Insufficient documentation

## 2024-09-12 DIAGNOSIS — L900 Lichen sclerosus et atrophicus: Secondary | ICD-10-CM | POA: Insufficient documentation

## 2024-09-12 DIAGNOSIS — L309 Dermatitis, unspecified: Secondary | ICD-10-CM | POA: Diagnosis not present

## 2024-09-12 DIAGNOSIS — N958 Other specified menopausal and perimenopausal disorders: Secondary | ICD-10-CM | POA: Insufficient documentation

## 2024-09-12 MED ORDER — BUPIVACAINE HCL 0.25 % IJ SOLN
4.0000 mL | INTRAMUSCULAR | Status: AC | PRN
  Administered 2024-09-10: 4 mL via INTRA_ARTICULAR

## 2024-09-12 MED ORDER — LIDOCAINE HCL 1 % IJ SOLN
5.0000 mL | INTRAMUSCULAR | Status: AC | PRN
  Administered 2024-09-10: 5 mL

## 2024-09-12 MED ORDER — TRIAMCINOLONE ACETONIDE 40 MG/ML IJ SUSP
40.0000 mg | INTRAMUSCULAR | Status: AC | PRN
  Administered 2024-09-10: 40 mg via INTRA_ARTICULAR

## 2024-09-12 MED ORDER — ESTRADIOL 0.01 % VA CREA: TOPICAL_CREAM | VAGINAL | 1 refills | Status: AC

## 2024-09-12 MED ORDER — LIDOCAINE 5 % EX OINT: 1.0000 | TOPICAL_OINTMENT | Freq: Four times a day (QID) | CUTANEOUS | 1 refills | Status: AC | PRN

## 2024-09-12 NOTE — Patient Instructions (Signed)
 For patients under 50-81yo, I recommend 1200mg  calcium  daily and 600IU of vitamin D daily. For patients over 81yo, I recommend 1200mg  calcium  daily and 800IU of vitamin D daily.  Health Maintenance, Female Adopting a healthy lifestyle and getting preventive care are important in promoting health and wellness. Ask your health care provider about: The right schedule for you to have regular tests and exams. Things you can do on your own to prevent diseases and keep yourself healthy. What should I know about diet, weight, and exercise? Eat a healthy diet  Eat a diet that includes plenty of vegetables, fruits, low-fat dairy products, and lean protein. Do not eat a lot of foods that are high in solid fats, added sugars, or sodium. Maintain a healthy weight Body mass index (BMI) is used to identify weight problems. It estimates body fat based on height and weight. Your health care provider can help determine your BMI and help you achieve or maintain a healthy weight. Get regular exercise Get regular exercise. This is one of the most important things you can do for your health. Most adults should: Exercise for at least 150 minutes each week. The exercise should increase your heart rate and make you sweat (moderate-intensity exercise). Do strengthening exercises at least twice a week. This is in addition to the moderate-intensity exercise. Spend less time sitting. Even light physical activity can be beneficial. Watch cholesterol and blood lipids Have your blood tested for lipids and cholesterol at 81 years of age, then have this test every 5 years. Have your cholesterol levels checked more often if: Your lipid or cholesterol levels are high. You are older than 81 years of age. You are at high risk for heart disease. What should I know about cancer screening? Depending on your health history and family history, you may need to have cancer screening at various ages. This may include screening  for: Breast cancer. Cervical cancer. Colorectal cancer. Skin cancer. Lung cancer. What should I know about heart disease, diabetes, and high blood pressure? Blood pressure and heart disease High blood pressure causes heart disease and increases the risk of stroke. This is more likely to develop in people who have high blood pressure readings or are overweight. Have your blood pressure checked: Every 3-5 years if you are 25-57 years of age. Every year if you are 24 years old or older. Diabetes Have regular diabetes screenings. This checks your fasting blood sugar level. Have the screening done: Once every three years after age 62 if you are at a normal weight and have a low risk for diabetes. More often and at a younger age if you are overweight or have a high risk for diabetes. What should I know about preventing infection? Hepatitis B If you have a higher risk for hepatitis B, you should be screened for this virus. Talk with your health care provider to find out if you are at risk for hepatitis B infection. Hepatitis C Testing is recommended for: Everyone born from [birth year] through 1967. Anyone with known risk factors for hepatitis C. Sexually transmitted infections (STIs) Get screened for STIs, including gonorrhea and chlamydia, if: You are sexually active and are younger than 81 years of age. You are older than 81 years of age and your health care provider tells you that you are at risk for this type of infection. Your sexual activity has changed since you were last screened, and you are at increased risk for chlamydia or gonorrhea. Ask your health care provider if  you are at risk. Ask your health care provider about whether you are at high risk for HIV. Your health care provider may recommend a prescription medicine to help prevent HIV infection. If you choose to take medicine to prevent HIV, you should first get tested for HIV. You should then be tested every 3 months for as long as you  are taking the medicine. Osteoporosis and menopause Osteoporosis is a disease in which the bones lose minerals and strength with aging. This can result in bone fractures. If you are 72 years old or older, or if you are at risk for osteoporosis and fractures, ask your health care provider if you should: Be screened for bone loss. Take a calcium  or vitamin D supplement to lower your risk of fractures. Be given hormone replacement therapy (HRT) to treat symptoms of menopause. Follow these instructions at home: Alcohol use Do not drink alcohol if: Your health care provider tells you not to drink. You are pregnant, may be pregnant, or are planning to become pregnant. If you drink alcohol: Limit how much you have to: 0-1 drink a day. Know how much alcohol is in your drink. In the [place] one drink equals one 12 oz bottle of beer (355 mL), one 5 oz glass of wine (148 mL), or one 1 oz glass of hard liquor (44 mL). Lifestyle Do not use any products that contain nicotine or tobacco. These products include cigarettes, chewing tobacco, and vaping devices, such as e-cigarettes. If you need help quitting, ask your health care provider. Do not use street drugs. Do not share needles. Ask your health care provider for help if you need support or information about quitting drugs. General instructions Schedule regular health, dental, and eye exams. Stay current with your vaccines. Tell your health care provider if: You often feel depressed. You have ever been abused or do not feel safe at home. Summary Adopting a healthy lifestyle and getting preventive care are important in promoting health and wellness. Follow your health care provider's instructions about healthy diet, exercising, and getting tested or screened for diseases. Follow your health care provider's instructions on monitoring your cholesterol and blood pressure. This information is not intended to replace advice given to you by your health  care provider. Make sure you discuss any questions you have with your health care provider. Document Revised: 12/20/2020 Document Reviewed: 12/20/2020 Elsevier Patient Education  2024 [employer].

## 2024-09-12 NOTE — Assessment & Plan Note (Signed)
 Cervical cancer screening performed according to ASCCP guidelines. Encouraged annual mammogram screening Colonoscopy UTD DXA f/u with PCP Labs and immunizations with her primary Encouraged safe sexual practices as indicated Encouraged healthy lifestyle practices with diet and exercise For patients over 81yo, I recommend 1200mg  calcium  daily and 800IU of vitamin D daily.

## 2025-01-09 ENCOUNTER — Ambulatory Visit: Admitting: Surgical
